# Patient Record
Sex: Female | Born: 1935 | Race: White | Hispanic: No | Marital: Married | Smoking: Former smoker
Health system: Southern US, Community
[De-identification: ages and names within clinical notes are randomized; demographics above are authoritative.]

## PROBLEM LIST (undated history)

## (undated) DIAGNOSIS — Z8619 Personal history of other infectious and parasitic diseases: Secondary | ICD-10-CM

## (undated) DIAGNOSIS — I639 Cerebral infarction, unspecified: Secondary | ICD-10-CM

## (undated) DIAGNOSIS — I10 Essential (primary) hypertension: Secondary | ICD-10-CM

## (undated) DIAGNOSIS — I4891 Unspecified atrial fibrillation: Secondary | ICD-10-CM

## (undated) DIAGNOSIS — E785 Hyperlipidemia, unspecified: Secondary | ICD-10-CM

## (undated) DIAGNOSIS — Z7901 Long term (current) use of anticoagulants: Secondary | ICD-10-CM

## (undated) HISTORY — DX: Hyperlipidemia, unspecified: E78.5

## (undated) HISTORY — DX: Cerebral infarction, unspecified: I63.9

## (undated) HISTORY — DX: Unspecified atrial fibrillation: I48.91

## (undated) HISTORY — PX: APPENDECTOMY: SHX54

## (undated) HISTORY — PX: DILATION AND CURETTAGE OF UTERUS: SHX78

## (undated) HISTORY — DX: Personal history of other infectious and parasitic diseases: Z86.19

## (undated) HISTORY — DX: Essential (primary) hypertension: I10

## (undated) HISTORY — PX: BACK SURGERY: SHX140

## (undated) HISTORY — DX: Long term (current) use of anticoagulants: Z79.01

---

## 1991-11-09 HISTORY — PX: CARDIAC CATHETERIZATION: SHX172

## 1999-06-02 ENCOUNTER — Other Ambulatory Visit: Admission: RE | Admit: 1999-06-02 | Discharge: 1999-06-02 | Payer: Self-pay | Admitting: Obstetrics & Gynecology

## 2000-07-05 ENCOUNTER — Other Ambulatory Visit: Admission: RE | Admit: 2000-07-05 | Discharge: 2000-07-05 | Payer: Self-pay | Admitting: Obstetrics & Gynecology

## 2002-08-09 ENCOUNTER — Other Ambulatory Visit: Admission: RE | Admit: 2002-08-09 | Discharge: 2002-08-09 | Payer: Self-pay | Admitting: Obstetrics & Gynecology

## 2004-09-24 ENCOUNTER — Other Ambulatory Visit: Admission: RE | Admit: 2004-09-24 | Discharge: 2004-09-24 | Payer: Self-pay | Admitting: Obstetrics & Gynecology

## 2006-04-16 ENCOUNTER — Encounter: Admission: RE | Admit: 2006-04-16 | Discharge: 2006-04-16 | Payer: Self-pay | Admitting: Cardiology

## 2010-06-19 ENCOUNTER — Ambulatory Visit: Payer: Self-pay | Admitting: Cardiology

## 2010-07-21 ENCOUNTER — Ambulatory Visit: Payer: Self-pay | Admitting: Cardiology

## 2010-08-19 ENCOUNTER — Ambulatory Visit: Payer: Self-pay | Admitting: Cardiology

## 2010-08-22 ENCOUNTER — Ambulatory Visit: Payer: Self-pay | Admitting: Cardiology

## 2010-09-02 ENCOUNTER — Ambulatory Visit: Payer: Self-pay | Admitting: Cardiology

## 2010-09-09 ENCOUNTER — Ambulatory Visit: Payer: Self-pay | Admitting: Cardiology

## 2010-12-01 ENCOUNTER — Encounter (INDEPENDENT_AMBULATORY_CARE_PROVIDER_SITE_OTHER): Payer: Medicare Other

## 2010-12-01 DIAGNOSIS — R079 Chest pain, unspecified: Secondary | ICD-10-CM

## 2010-12-01 DIAGNOSIS — Z7901 Long term (current) use of anticoagulants: Secondary | ICD-10-CM

## 2010-12-04 ENCOUNTER — Other Ambulatory Visit (INDEPENDENT_AMBULATORY_CARE_PROVIDER_SITE_OTHER): Payer: Medicare Other

## 2010-12-04 DIAGNOSIS — Z7901 Long term (current) use of anticoagulants: Secondary | ICD-10-CM

## 2010-12-04 DIAGNOSIS — R079 Chest pain, unspecified: Secondary | ICD-10-CM

## 2010-12-18 ENCOUNTER — Encounter (INDEPENDENT_AMBULATORY_CARE_PROVIDER_SITE_OTHER): Payer: Medicare Other

## 2010-12-18 DIAGNOSIS — Z7901 Long term (current) use of anticoagulants: Secondary | ICD-10-CM

## 2010-12-18 DIAGNOSIS — I6350 Cerebral infarction due to unspecified occlusion or stenosis of unspecified cerebral artery: Secondary | ICD-10-CM

## 2010-12-30 ENCOUNTER — Ambulatory Visit (INDEPENDENT_AMBULATORY_CARE_PROVIDER_SITE_OTHER): Payer: Medicare Other | Admitting: Cardiology

## 2010-12-30 DIAGNOSIS — Z79899 Other long term (current) drug therapy: Secondary | ICD-10-CM

## 2010-12-30 DIAGNOSIS — E7800 Pure hypercholesterolemia, unspecified: Secondary | ICD-10-CM

## 2010-12-30 DIAGNOSIS — Z7901 Long term (current) use of anticoagulants: Secondary | ICD-10-CM

## 2010-12-30 DIAGNOSIS — I6350 Cerebral infarction due to unspecified occlusion or stenosis of unspecified cerebral artery: Secondary | ICD-10-CM

## 2010-12-30 DIAGNOSIS — I119 Hypertensive heart disease without heart failure: Secondary | ICD-10-CM

## 2011-01-07 ENCOUNTER — Other Ambulatory Visit: Payer: Self-pay | Admitting: *Deleted

## 2011-01-07 DIAGNOSIS — I10 Essential (primary) hypertension: Secondary | ICD-10-CM

## 2011-01-07 MED ORDER — OLMESARTAN MEDOXOMIL-HCTZ 40-25 MG PO TABS: 1.0000 | ORAL_TABLET | Freq: Every day | ORAL | Status: DC

## 2011-01-07 NOTE — Telephone Encounter (Signed)
Refilled meds per fax request.  

## 2011-01-29 ENCOUNTER — Ambulatory Visit (INDEPENDENT_AMBULATORY_CARE_PROVIDER_SITE_OTHER): Payer: Medicare Other | Admitting: *Deleted

## 2011-01-29 DIAGNOSIS — I639 Cerebral infarction, unspecified: Secondary | ICD-10-CM | POA: Insufficient documentation

## 2011-01-29 DIAGNOSIS — I6350 Cerebral infarction due to unspecified occlusion or stenosis of unspecified cerebral artery: Secondary | ICD-10-CM

## 2011-01-29 LAB — POCT INR: INR: 2.4

## 2011-02-26 ENCOUNTER — Ambulatory Visit (INDEPENDENT_AMBULATORY_CARE_PROVIDER_SITE_OTHER): Payer: Medicare Other | Admitting: *Deleted

## 2011-02-26 DIAGNOSIS — I639 Cerebral infarction, unspecified: Secondary | ICD-10-CM

## 2011-02-26 DIAGNOSIS — I6350 Cerebral infarction due to unspecified occlusion or stenosis of unspecified cerebral artery: Secondary | ICD-10-CM

## 2011-02-26 LAB — POCT INR: INR: 2.5

## 2011-03-25 ENCOUNTER — Encounter: Payer: Self-pay | Admitting: Cardiology

## 2011-03-25 ENCOUNTER — Other Ambulatory Visit: Payer: Self-pay | Admitting: *Deleted

## 2011-03-25 DIAGNOSIS — E785 Hyperlipidemia, unspecified: Secondary | ICD-10-CM

## 2011-03-26 ENCOUNTER — Other Ambulatory Visit (INDEPENDENT_AMBULATORY_CARE_PROVIDER_SITE_OTHER): Payer: Medicare Other | Admitting: *Deleted

## 2011-03-26 ENCOUNTER — Ambulatory Visit (INDEPENDENT_AMBULATORY_CARE_PROVIDER_SITE_OTHER): Payer: Medicare Other | Admitting: *Deleted

## 2011-03-26 DIAGNOSIS — I6350 Cerebral infarction due to unspecified occlusion or stenosis of unspecified cerebral artery: Secondary | ICD-10-CM

## 2011-03-26 DIAGNOSIS — E785 Hyperlipidemia, unspecified: Secondary | ICD-10-CM

## 2011-03-26 DIAGNOSIS — I639 Cerebral infarction, unspecified: Secondary | ICD-10-CM

## 2011-03-26 LAB — HEPATIC FUNCTION PANEL
ALT: 29 U/L (ref 0–35)
AST: 33 U/L (ref 0–37)
Albumin: 4.4 g/dL (ref 3.5–5.2)
Alkaline Phosphatase: 74 U/L (ref 39–117)
Bilirubin, Direct: 0.1 mg/dL (ref 0.0–0.3)
Total Bilirubin: 0.5 mg/dL (ref 0.3–1.2)
Total Protein: 7.5 g/dL (ref 6.0–8.3)

## 2011-03-26 LAB — LIPID PANEL
Cholesterol: 178 mg/dL (ref 0–200)
HDL: 78.2 mg/dL (ref >39.00)
LDL Cholesterol: 86 mg/dL (ref 0–99)
Total CHOL/HDL Ratio: 2
Triglycerides: 70 mg/dL (ref 0.0–149.0)
VLDL: 14 mg/dL (ref 0.0–40.0)

## 2011-03-26 LAB — BASIC METABOLIC PANEL
BUN: 17 mg/dL (ref 6–23)
CO2: 30 mEq/L (ref 19–32)
Calcium: 9.5 mg/dL (ref 8.4–10.5)
Chloride: 92 mEq/L — ABNORMAL LOW (ref 96–112)
Creatinine, Ser: 0.9 mg/dL (ref 0.4–1.2)
GFR: 63.35 mL/min (ref >60.00)
Glucose, Bld: 107 mg/dL — ABNORMAL HIGH (ref 70–99)
Potassium: 3.8 mEq/L (ref 3.5–5.1)
Sodium: 134 mEq/L — ABNORMAL LOW (ref 135–145)

## 2011-03-26 LAB — POCT INR: INR: 2.2

## 2011-03-27 ENCOUNTER — Ambulatory Visit (INDEPENDENT_AMBULATORY_CARE_PROVIDER_SITE_OTHER): Payer: Medicare Other | Admitting: Cardiology

## 2011-03-27 ENCOUNTER — Encounter: Payer: Self-pay | Admitting: Cardiology

## 2011-03-27 DIAGNOSIS — I639 Cerebral infarction, unspecified: Secondary | ICD-10-CM

## 2011-03-27 DIAGNOSIS — I6350 Cerebral infarction due to unspecified occlusion or stenosis of unspecified cerebral artery: Secondary | ICD-10-CM

## 2011-03-27 DIAGNOSIS — I119 Hypertensive heart disease without heart failure: Secondary | ICD-10-CM

## 2011-03-27 DIAGNOSIS — E7800 Pure hypercholesterolemia, unspecified: Secondary | ICD-10-CM

## 2011-03-27 DIAGNOSIS — E785 Hyperlipidemia, unspecified: Secondary | ICD-10-CM | POA: Insufficient documentation

## 2011-03-27 NOTE — Assessment & Plan Note (Signed)
The patient is on long-term Coumadin for her priorCerebrovascular accident.  Her stroke occurred up in [place] and she was placed on warfarin at that time.  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  Her last echocardiogram here on 02/19/10 showed normal left ventricular systolic function, impaired relaxation, trivial aortic insufficiency, mild tricuspid regurgitation with normal pulmonary artery pressure.  Mitral valve prolapse was not seen.  Left atrial size was upper normal at 40 mm.  She has not had any further TIA or stroke symptoms since being on Coumadin.

## 2011-03-27 NOTE — Progress Notes (Signed)
[name] Date of Birth:  [birth year] [facility] / [facility] [address]   [address] [place], [place]  [address] [phone]           Fax   [phone]  History of Present Illness: This pleasant 77 year old woman is seen for a followup office visit and protime.  She has a past history of an embolic stroke which occurred while traveling in [place] several years ago.  She's been on Coumadin since that time with no recurrence of her symptoms.  She does not have any history of known coronary disease.  Her last nuclear stress test was 03/04/10 and showed normal myocardial perfusion imaging and her left intraocular ejection fraction was vigorous at 85% with no regional wall motion abnormalities.  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  Patient has a history of hypercholesterolemia and is on statin therapy.  She's had a history of essential hypertension she quit smoking in 2007  Current Outpatient Prescriptions  Medication Sig Dispense Refill  . amLODipine (NORVASC) 5 MG tablet Take 5 mg by mouth daily.        Marland Kitchen aspirin 81 MG tablet Take 81 mg by mouth daily.        Marland Kitchen atorvastatin (LIPITOR) 10 MG tablet Take 10 mg by mouth daily.        . B Complex-C-Folic Acid (FOLBEE PLUS PO) Take by mouth daily.        . Calcium Carbonate-Vitamin D (CALCIUM 600 + D PO) Take by mouth daily.        . diazepam (VALIUM) 2 MG tablet Take 2 mg by mouth every 6 (six) hours as needed.        . furosemide (LASIX) 20 MG tablet Take 20 mg by mouth as needed.        . nadolol (CORGARD) 40 MG tablet Take 40 mg by mouth daily.        . nitroGLYCERIN (NITROSTAT) 0.4 MG SL tablet Place 0.4 mg under the tongue every 5 (five) minutes as needed.        Marland Kitchen olmesartan-hydrochlorothiazide (BENICAR HCT)  40-25 MG per tablet Take 1 tablet by mouth daily.  90 tablet  3  . warfarin (COUMADIN) 5 MG tablet Take 5 mg by mouth as directed.          Allergies  Allergen Reactions  . Cozaar   . Hydralazine   . Hyzaar   . Lisinopril   . Sulfa Drugs Cross Reactors     Patient Active Problem List  Diagnoses  . CVA (cerebral infarction)  . Benign hypertensive heart disease without heart failure  . Hypercholesterolemia    History  Smoking status  . Former Smoker  . Quit date: 10/19/1998  Smokeless tobacco  . Not on file    History  Alcohol Use No    Family History  Problem Relation Age of Onset  . Heart disease Mother   . Hypertension Mother   . Stroke Mother   . Heart disease Father   . Heart failure Brother     Review of Systems: Constitutional: no fever chills diaphoresis or fatigue or change in weight.  Head and neck: no hearing loss, no epistaxis, no photophobia or visual disturbance. Respiratory: No cough, shortness of breath or wheezing. Cardiovascular: No chest pain peripheral edema, palpitations. Gastrointestinal:  No abdominal distention, no abdominal pain, no change in bowel habits hematochezia or melena. Genitourinary: No dysuria, no frequency, no urgency, no nocturia. Musculoskeletal:No arthralgias, no back pain, no gait disturbance or myalgias. Neurological: No dizziness, no headaches, no numbness, no seizures, no syncope, no weakness, no tremors. Hematologic: No lymphadenopathy, no easy bruising. Psychiatric: No confusion, no hallucinations, no sleep disturbance.    Physical Exam: Filed Vitals:   03/27/11 1354  BP: 120/68  Pulse: 64  The general appearance reveals a well-developed well-nourished woman in no distress.The head and neck exam reveals pupils equal and reactive.  Extraocular movements are full.  There is no scleral icterus.  The mouth and pharynx are normal.  The neck is supple.  The carotids reveal no bruits.  The jugular venous pressure is  normal.  The  thyroid is not enlarged.  There is no lymphadenopathy.  The chest is clear to percussion and auscultation.  There are no rales or rhonchi.  Expansion of the chest is symmetrical.  The precordium is quiet.  The first heart sound is normal.  The second heart sound is physiologically split.  There is no murmur gallop rub or click.  There is no abnormal lift or heave.  The abdomen is soft and nontender.  The bowel sounds are normal.  The liver and spleen are not enlarged.  There are no abdominal masses.  There are no abdominal bruits.  Extremities reveal good pedal pulses.  There is no phlebitis or edema.  There is no cyanosis or clubbing.  Strength is normal and symmetrical in all extremities.  There is no lateralizing weakness.  There are no sensory deficits.  The skin is warm and dry.  There is no rash.   Assessment / Plan: Continue same medication.  Recheck in 4 months for followup office visitEKG and fasting lipid panel hepatic function panel and basal metabolic panel.  Return next month for a followup protime.  INR today is 2.2

## 2011-03-27 NOTE — Assessment & Plan Note (Signed)
This 77 year old woman has a history of prior stroke and a history of essential hypertension.  She has not been expressing any recent symptoms from her high blood pressure.  She denies any dizziness or headache.  She's not having any chest pain or shortness of breath.  No syncope.

## 2011-03-27 NOTE — Assessment & Plan Note (Signed)
The patient has a history of hypercholesterolemia.  She is on Lipitor.  We reviewed her blood work which was drawn recently and shows satisfactory response to medication.  She's not having any leg pain or other myalgias or side effects from his statin therapy.

## 2011-04-24 ENCOUNTER — Ambulatory Visit (INDEPENDENT_AMBULATORY_CARE_PROVIDER_SITE_OTHER): Payer: Medicare Other | Admitting: *Deleted

## 2011-04-24 DIAGNOSIS — I6350 Cerebral infarction due to unspecified occlusion or stenosis of unspecified cerebral artery: Secondary | ICD-10-CM

## 2011-04-24 DIAGNOSIS — I639 Cerebral infarction, unspecified: Secondary | ICD-10-CM

## 2011-04-24 LAB — POCT INR: INR: 2.7

## 2011-04-26 ENCOUNTER — Other Ambulatory Visit: Payer: Self-pay | Admitting: Cardiology

## 2011-04-26 DIAGNOSIS — I10 Essential (primary) hypertension: Secondary | ICD-10-CM

## 2011-04-27 ENCOUNTER — Other Ambulatory Visit: Payer: Self-pay | Admitting: *Deleted

## 2011-04-27 NOTE — Telephone Encounter (Signed)
Refilled nadolol to [facility]

## 2011-04-30 ENCOUNTER — Other Ambulatory Visit: Payer: Self-pay | Admitting: Cardiology

## 2011-04-30 DIAGNOSIS — F419 Anxiety disorder, unspecified: Secondary | ICD-10-CM

## 2011-05-26 LAB — PROTIME-INR: INR: 2.7 — AB (ref <=1.1)

## 2011-06-01 ENCOUNTER — Telehealth: Payer: Self-pay | Admitting: Cardiology

## 2011-06-01 ENCOUNTER — Ambulatory Visit (INDEPENDENT_AMBULATORY_CARE_PROVIDER_SITE_OTHER): Payer: Self-pay | Admitting: Cardiology

## 2011-06-01 DIAGNOSIS — R0989 Other specified symptoms and signs involving the circulatory and respiratory systems: Secondary | ICD-10-CM

## 2011-06-01 NOTE — Telephone Encounter (Signed)
Pt would like to confirm about a fax report of coumadin in another state that we should have received, please call pt back, looking for chart

## 2011-06-01 NOTE — Telephone Encounter (Signed)
Chart in box 

## 2011-06-01 NOTE — Telephone Encounter (Signed)
Advised patient did receive however did not have a call back #.  Advised to continue same dose of medications of coumadin and recheck in 4 weeks.

## 2011-06-12 ENCOUNTER — Encounter: Payer: Medicare Other | Admitting: *Deleted

## 2011-06-16 ENCOUNTER — Ambulatory Visit (INDEPENDENT_AMBULATORY_CARE_PROVIDER_SITE_OTHER): Payer: Medicare Other | Admitting: *Deleted

## 2011-06-16 ENCOUNTER — Encounter: Payer: Medicare Other | Admitting: *Deleted

## 2011-06-16 DIAGNOSIS — I6350 Cerebral infarction due to unspecified occlusion or stenosis of unspecified cerebral artery: Secondary | ICD-10-CM

## 2011-06-16 DIAGNOSIS — I639 Cerebral infarction, unspecified: Secondary | ICD-10-CM

## 2011-06-16 LAB — POCT INR: INR: 2.6

## 2011-07-14 ENCOUNTER — Ambulatory Visit (INDEPENDENT_AMBULATORY_CARE_PROVIDER_SITE_OTHER): Payer: Medicare Other | Admitting: *Deleted

## 2011-07-14 DIAGNOSIS — I6350 Cerebral infarction due to unspecified occlusion or stenosis of unspecified cerebral artery: Secondary | ICD-10-CM

## 2011-07-14 DIAGNOSIS — I639 Cerebral infarction, unspecified: Secondary | ICD-10-CM

## 2011-07-14 DIAGNOSIS — E785 Hyperlipidemia, unspecified: Secondary | ICD-10-CM

## 2011-07-14 LAB — POCT INR: INR: 2.2

## 2011-08-11 ENCOUNTER — Other Ambulatory Visit (INDEPENDENT_AMBULATORY_CARE_PROVIDER_SITE_OTHER): Payer: Medicare Other | Admitting: *Deleted

## 2011-08-11 ENCOUNTER — Ambulatory Visit (INDEPENDENT_AMBULATORY_CARE_PROVIDER_SITE_OTHER): Payer: Medicare Other | Admitting: *Deleted

## 2011-08-11 DIAGNOSIS — I6350 Cerebral infarction due to unspecified occlusion or stenosis of unspecified cerebral artery: Secondary | ICD-10-CM

## 2011-08-11 DIAGNOSIS — E785 Hyperlipidemia, unspecified: Secondary | ICD-10-CM

## 2011-08-11 DIAGNOSIS — I639 Cerebral infarction, unspecified: Secondary | ICD-10-CM

## 2011-08-11 LAB — COMPREHENSIVE METABOLIC PANEL
ALT: 23 U/L (ref 0–35)
AST: 30 U/L (ref 0–37)
Albumin: 4.2 g/dL (ref 3.5–5.2)
Alkaline Phosphatase: 74 U/L (ref 39–117)
BUN: 16 mg/dL (ref 6–23)
CO2: 31 mEq/L (ref 19–32)
Calcium: 9.6 mg/dL (ref 8.4–10.5)
Chloride: 95 mEq/L — ABNORMAL LOW (ref 96–112)
Creatinine, Ser: 1.1 mg/dL (ref 0.4–1.2)
GFR: 49.3 mL/min — ABNORMAL LOW (ref >60.00)
Glucose, Bld: 109 mg/dL — ABNORMAL HIGH (ref 70–99)
Potassium: 3.8 mEq/L (ref 3.5–5.1)
Sodium: 134 mEq/L — ABNORMAL LOW (ref 135–145)
Total Bilirubin: 0.6 mg/dL (ref 0.3–1.2)
Total Protein: 7.4 g/dL (ref 6.0–8.3)

## 2011-08-11 LAB — LIPID PANEL
Cholesterol: 162 mg/dL (ref 0–200)
HDL: 83.8 mg/dL (ref >39.00)
LDL Cholesterol: 62 mg/dL (ref 0–99)
Total CHOL/HDL Ratio: 2
Triglycerides: 82 mg/dL (ref 0.0–149.0)
VLDL: 16.4 mg/dL (ref 0.0–40.0)

## 2011-08-11 LAB — POCT INR: INR: 2.3

## 2011-08-12 ENCOUNTER — Other Ambulatory Visit: Payer: Self-pay | Admitting: Cardiology

## 2011-08-18 ENCOUNTER — Telehealth: Payer: Self-pay | Admitting: *Deleted

## 2011-08-18 NOTE — Telephone Encounter (Signed)
Message copied by Burnell Blanks on Tue Aug 18, 2011  9:47 AM ------      Message from: Cassell Clement      Created: Sun Aug 16, 2011  4:18 PM       Please report.  The blood work is satisfactory.  Discuss further at office visit in November.  Make copy for patient

## 2011-08-18 NOTE — Progress Notes (Signed)
Advised of labs 

## 2011-08-18 NOTE — Telephone Encounter (Signed)
Advised patient of labs and made copy for upcoming office visit

## 2011-09-01 ENCOUNTER — Other Ambulatory Visit: Payer: Self-pay | Admitting: Nurse Practitioner

## 2011-09-07 ENCOUNTER — Ambulatory Visit (INDEPENDENT_AMBULATORY_CARE_PROVIDER_SITE_OTHER): Payer: Medicare Other | Admitting: Cardiology

## 2011-09-07 ENCOUNTER — Encounter: Payer: Self-pay | Admitting: Cardiology

## 2011-09-07 VITALS — BP 138/80 | HR 60 | Ht 62.0 in | Wt 142.0 lb

## 2011-09-07 DIAGNOSIS — E7800 Pure hypercholesterolemia, unspecified: Secondary | ICD-10-CM

## 2011-09-07 DIAGNOSIS — I639 Cerebral infarction, unspecified: Secondary | ICD-10-CM

## 2011-09-07 DIAGNOSIS — E785 Hyperlipidemia, unspecified: Secondary | ICD-10-CM

## 2011-09-07 DIAGNOSIS — R0989 Other specified symptoms and signs involving the circulatory and respiratory systems: Secondary | ICD-10-CM

## 2011-09-07 DIAGNOSIS — I6350 Cerebral infarction due to unspecified occlusion or stenosis of unspecified cerebral artery: Secondary | ICD-10-CM

## 2011-09-07 DIAGNOSIS — R0600 Dyspnea, unspecified: Secondary | ICD-10-CM

## 2011-09-07 DIAGNOSIS — R0609 Other forms of dyspnea: Secondary | ICD-10-CM

## 2011-09-07 DIAGNOSIS — I119 Hypertensive heart disease without heart failure: Secondary | ICD-10-CM

## 2011-09-07 MED ORDER — ATORVASTATIN CALCIUM 20 MG PO TABS: 20.0000 mg | ORAL_TABLET | Freq: Every day | ORAL | Status: DC

## 2011-09-07 NOTE — Assessment & Plan Note (Signed)
The patient is on Lipitor for her cholesterol.  She's not having any side effects from the Lipitor.  Some laboratory studies were excellent.  We are switching her from brand name Lipitor to generic Lipitor to save money

## 2011-09-07 NOTE — Progress Notes (Signed)
[name] Date of Birth:  [birth year] [facility] / [facility] [address]   [address] [place], [place]  [address] [phone]           Fax   [phone]  History of Present Illness: This pleasant 77 year old woman is seen for a scheduled followup office visit.  She has a past history of a cerebrovascular infarction and a history of high blood pressure and high cholesterol.  She is a former smoker.  He does not have a history of coronary disease.  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  Patient has high cholesterol and is on statin therapy.  She has been on long-term Coumadin since her embolic stroke which occurred several years ago.  She had a nuclear stress test on 03/04/10 which showed no regional wall motion abnormalities.  Current Outpatient Prescriptions  Medication Sig Dispense Refill  . amLODipine (NORVASC) 5 MG tablet TAKE ONE TABLET BY MOUTH EVERY DAY  90 tablet  3  . aspirin 81 MG tablet Take 81 mg by mouth daily.        Marland Kitchen atorvastatin (LIPITOR) 20 MG tablet Take 1 tablet (20 mg total) by mouth daily.  90 each  3  . B Complex-C-Folic Acid (FOLBEE PLUS PO) Take by mouth daily.        . Calcium Carbonate-Vitamin D (CALCIUM 600 + D PO) Take by mouth daily.        . diazepam (VALIUM) 2 MG tablet TAKE ONE TABLET BY MOUTH EVERY DAY AS NEEDED  100 tablet  1  . furosemide (LASIX) 20 MG tablet Take 20 mg by mouth as needed.        . nadolol (CORGARD) 40 MG tablet TAKE ONE TABLET BY MOUTH EVERY DAY  90 tablet  3  . nitroGLYCERIN (NITROSTAT) 0.4 MG SL tablet Place 0.4 mg under the tongue every 5 (five) minutes as needed.        Marland Kitchen olmesartan-hydrochlorothiazide (BENICAR HCT) 40-25 MG per tablet Take 1 tablet by mouth daily.  90 tablet  3  . warfarin (COUMADIN) 5 MG tablet Take 5 mg by mouth as directed.           Allergies  Allergen Reactions  . Cozaar   . Hydralazine   . Hyzaar   . Lisinopril   . Sulfa Drugs Cross Reactors     Patient Active Problem List  Diagnoses  . CVA (cerebral infarction)  . Benign hypertensive heart disease without heart failure  . Hypercholesterolemia    History  Smoking status  . Former Smoker  . Quit date: 10/19/1998  Smokeless tobacco  . Not on file    History  Alcohol Use No    Family History  Problem Relation Age of Onset  . Heart disease Mother   . Hypertension Mother   . Stroke Mother   . Heart disease Father   . Heart failure Brother     Review of Systems: Constitutional: no fever chills diaphoresis or fatigue or change in weight.  Head and neck: no hearing loss, no epistaxis, no photophobia or visual disturbance. Respiratory: No cough, shortness of breath or wheezing. Cardiovascular: No chest pain peripheral edema, palpitations. Gastrointestinal: No abdominal distention, no abdominal pain, no change in bowel habits hematochezia or melena. Genitourinary: No dysuria, no frequency, no urgency, no nocturia. Musculoskeletal:No arthralgias, no back pain, no gait  disturbance or myalgias. Neurological: No dizziness, no headaches, no numbness, no seizures, no syncope, no weakness, no tremors. Hematologic: No lymphadenopathy, no easy bruising. Psychiatric: No confusion, no hallucinations, no sleep disturbance.    Physical Exam: Filed Vitals:   09/07/11 1530  BP: 138/80  Pulse: 60   The head and neck exam reveals pupils equal and reactive.  Extraocular movements are full.  There is no scleral icterus.  The mouth and pharynx are normal.  The neck is supple.  The carotids reveal no bruits.  The jugular venous pressure is normal.  The  thyroid is not enlarged.  There is no lymphadenopathy.  The chest is clear to percussion and auscultation.  There are no rales or rhonchi.  Expansion of the chest is symmetrical.  The precordium is quiet.  The  first heart sound is normal.  The second heart sound is physiologically split.  There is no murmur gallop rub or click.  There is no abnormal lift or heave.  The abdomen is soft and nontender.  The bowel sounds are normal.  The liver and spleen are not enlarged.  There are no abdominal masses.  There are no abdominal bruits.  Extremities reveal good pedal pulses.  There is no phlebitis or edema.  There is no cyanosis or clubbing.  Strength is normal and symmetrical in all extremities.  There is no lateralizing weakness.  There are no sensory deficits.  The skin is warm and dry.  There is no rash.    Assessment / Plan: EKG today shows sinus bradycardia with first degree AV block and no ischemic changes.  She will continue same medication and recheck in 4 months for followup office visit lipid panel CBC and chemistries

## 2011-09-07 NOTE — Assessment & Plan Note (Signed)
The patient has occasional headaches.  She has not had any TIA symptoms or symptoms of recurrent stroke.  She's not having a problem from the warfarin.

## 2011-09-07 NOTE — Patient Instructions (Signed)
Will send Rx for generic Lipitor to [facility] for 90 days with refills Your physician recommends that you schedule a follow-up appointment in: 4 months with fasting labs (lp/bmet/hfp/cbc)

## 2011-09-07 NOTE — Assessment & Plan Note (Signed)
The patient has not had any problems recently with her blood pressure.  She denies dizzy spells or syncope.  She has mild exertional dyspnea which has been chronic.  She is a former smoker

## 2011-09-22 ENCOUNTER — Ambulatory Visit (INDEPENDENT_AMBULATORY_CARE_PROVIDER_SITE_OTHER): Payer: Medicare Other | Admitting: *Deleted

## 2011-09-22 DIAGNOSIS — I6350 Cerebral infarction due to unspecified occlusion or stenosis of unspecified cerebral artery: Secondary | ICD-10-CM

## 2011-09-22 DIAGNOSIS — I639 Cerebral infarction, unspecified: Secondary | ICD-10-CM

## 2011-09-22 LAB — POCT INR: INR: 2.2

## 2011-11-03 ENCOUNTER — Ambulatory Visit (INDEPENDENT_AMBULATORY_CARE_PROVIDER_SITE_OTHER): Payer: Medicare Other | Admitting: *Deleted

## 2011-11-03 DIAGNOSIS — I639 Cerebral infarction, unspecified: Secondary | ICD-10-CM

## 2011-11-03 DIAGNOSIS — I6350 Cerebral infarction due to unspecified occlusion or stenosis of unspecified cerebral artery: Secondary | ICD-10-CM

## 2011-11-03 LAB — POCT INR: INR: 2.4

## 2011-11-23 ENCOUNTER — Other Ambulatory Visit: Payer: Self-pay | Admitting: *Deleted

## 2011-11-25 ENCOUNTER — Telehealth: Payer: Self-pay | Admitting: Cardiology

## 2011-11-25 MED ORDER — FOLBEE PLUS CZ 5 MG PO TABS: 5.0000 mg | ORAL_TABLET | Freq: Every day | ORAL | Status: DC

## 2011-11-25 NOTE — Telephone Encounter (Signed)
New Problem:     Patient called in needing a refill of her Calcium Carbonate-Vitamin D (CALCIUM 600 + D PO) filled with [facility] on [address] ([phone]). This is the only medication that needs to be filled at this location, all other medication should be refilled at the pharmacy listed the patient's profile. Please call back when the order has been filled.

## 2011-11-25 NOTE — Telephone Encounter (Signed)
Refilled folbee

## 2011-11-26 ENCOUNTER — Other Ambulatory Visit: Payer: Self-pay | Admitting: Cardiology

## 2011-11-26 DIAGNOSIS — F419 Anxiety disorder, unspecified: Secondary | ICD-10-CM

## 2011-11-26 NOTE — Telephone Encounter (Signed)
Refill on diazepam

## 2011-12-01 DIAGNOSIS — Z1231 Encounter for screening mammogram for malignant neoplasm of breast: Secondary | ICD-10-CM | POA: Diagnosis not present

## 2011-12-15 ENCOUNTER — Ambulatory Visit (INDEPENDENT_AMBULATORY_CARE_PROVIDER_SITE_OTHER): Payer: Medicare Other | Admitting: *Deleted

## 2011-12-15 DIAGNOSIS — I6350 Cerebral infarction due to unspecified occlusion or stenosis of unspecified cerebral artery: Secondary | ICD-10-CM | POA: Diagnosis not present

## 2011-12-15 DIAGNOSIS — I639 Cerebral infarction, unspecified: Secondary | ICD-10-CM

## 2011-12-15 LAB — POCT INR
INR: 3.2
INR: 3.2

## 2011-12-25 ENCOUNTER — Other Ambulatory Visit: Payer: Self-pay | Admitting: Cardiology

## 2012-01-04 ENCOUNTER — Other Ambulatory Visit (INDEPENDENT_AMBULATORY_CARE_PROVIDER_SITE_OTHER): Payer: Medicare Other

## 2012-01-04 DIAGNOSIS — R0609 Other forms of dyspnea: Secondary | ICD-10-CM | POA: Diagnosis not present

## 2012-01-04 DIAGNOSIS — R0600 Dyspnea, unspecified: Secondary | ICD-10-CM

## 2012-01-04 DIAGNOSIS — E785 Hyperlipidemia, unspecified: Secondary | ICD-10-CM | POA: Diagnosis not present

## 2012-01-04 DIAGNOSIS — R0989 Other specified symptoms and signs involving the circulatory and respiratory systems: Secondary | ICD-10-CM | POA: Diagnosis not present

## 2012-01-04 DIAGNOSIS — I119 Hypertensive heart disease without heart failure: Secondary | ICD-10-CM

## 2012-01-04 LAB — CBC WITH DIFFERENTIAL/PLATELET
Basophils Absolute: 0 10*3/uL (ref 0.0–0.1)
Basophils Relative: 0.7 % (ref 0.0–3.0)
Eosinophils Absolute: 0.2 10*3/uL (ref 0.0–0.7)
Eosinophils Relative: 2.8 % (ref 0.0–5.0)
HCT: 43.4 % (ref 36.0–46.0)
Hemoglobin: 14.3 g/dL (ref 12.0–15.0)
Lymphocytes Relative: 29.8 % (ref 12.0–46.0)
Lymphs Abs: 2 10*3/uL (ref 0.7–4.0)
MCHC: 33 g/dL (ref 30.0–36.0)
MCV: 94.9 fl (ref 78.0–100.0)
Monocytes Absolute: 0.5 10*3/uL (ref 0.1–1.0)
Monocytes Relative: 6.7 % (ref 3.0–12.0)
Neutro Abs: 4.1 10*3/uL (ref 1.4–7.7)
Neutrophils Relative %: 60 % (ref 43.0–77.0)
Platelets: 248 10*3/uL (ref 150.0–400.0)
RBC: 4.57 Mil/uL (ref 3.87–5.11)
RDW: 13.7 % (ref 11.5–14.6)
WBC: 6.8 10*3/uL (ref 4.5–10.5)

## 2012-01-04 LAB — BASIC METABOLIC PANEL
BUN: 12 mg/dL (ref 6–23)
CO2: 30 mEq/L (ref 19–32)
Calcium: 9.8 mg/dL (ref 8.4–10.5)
Chloride: 97 mEq/L (ref 96–112)
Creatinine, Ser: 1.4 mg/dL — ABNORMAL HIGH (ref 0.4–1.2)
GFR: 39.1 mL/min — ABNORMAL LOW (ref >60.00)
Glucose, Bld: 112 mg/dL — ABNORMAL HIGH (ref 70–99)
Potassium: 3.8 mEq/L (ref 3.5–5.1)
Sodium: 136 mEq/L (ref 135–145)

## 2012-01-04 LAB — HEPATIC FUNCTION PANEL
ALT: 24 U/L (ref 0–35)
AST: 34 U/L (ref 0–37)
Albumin: 4 g/dL (ref 3.5–5.2)
Alkaline Phosphatase: 68 U/L (ref 39–117)
Bilirubin, Direct: 0.1 mg/dL (ref 0.0–0.3)
Total Bilirubin: 0.9 mg/dL (ref 0.3–1.2)
Total Protein: 7.4 g/dL (ref 6.0–8.3)

## 2012-01-04 LAB — LIPID PANEL
Cholesterol: 156 mg/dL (ref 0–200)
HDL: 76.2 mg/dL (ref >39.00)
LDL Cholesterol: 64 mg/dL (ref 0–99)
Total CHOL/HDL Ratio: 2
Triglycerides: 79 mg/dL (ref 0.0–149.0)
VLDL: 15.8 mg/dL (ref 0.0–40.0)

## 2012-01-04 NOTE — Progress Notes (Signed)
Quick Note:  Please make copy of labs for patient visit. ______ 

## 2012-01-08 ENCOUNTER — Ambulatory Visit (INDEPENDENT_AMBULATORY_CARE_PROVIDER_SITE_OTHER): Payer: Medicare Other | Admitting: Cardiology

## 2012-01-08 ENCOUNTER — Ambulatory Visit (INDEPENDENT_AMBULATORY_CARE_PROVIDER_SITE_OTHER): Payer: Medicare Other | Admitting: *Deleted

## 2012-01-08 ENCOUNTER — Ambulatory Visit
Admission: RE | Admit: 2012-01-08 | Discharge: 2012-01-08 | Disposition: A | Discharge status: Home / Self Care | Payer: Medicare Other | Source: Ambulatory Visit | Attending: Cardiology | Admitting: Cardiology

## 2012-01-08 ENCOUNTER — Encounter: Payer: Self-pay | Admitting: Cardiology

## 2012-01-08 VITALS — BP 124/86 | HR 66 | Ht 62.0 in | Wt 145.0 lb

## 2012-01-08 DIAGNOSIS — I119 Hypertensive heart disease without heart failure: Secondary | ICD-10-CM | POA: Diagnosis not present

## 2012-01-08 DIAGNOSIS — E7800 Pure hypercholesterolemia, unspecified: Secondary | ICD-10-CM | POA: Diagnosis not present

## 2012-01-08 DIAGNOSIS — I6350 Cerebral infarction due to unspecified occlusion or stenosis of unspecified cerebral artery: Secondary | ICD-10-CM | POA: Diagnosis not present

## 2012-01-08 DIAGNOSIS — I4891 Unspecified atrial fibrillation: Secondary | ICD-10-CM

## 2012-01-08 DIAGNOSIS — I639 Cerebral infarction, unspecified: Secondary | ICD-10-CM

## 2012-01-08 DIAGNOSIS — R0602 Shortness of breath: Secondary | ICD-10-CM | POA: Diagnosis not present

## 2012-01-08 LAB — POCT INR: INR: 3.4

## 2012-01-08 MED ORDER — AMIODARONE HCL 200 MG PO TABS: 200.0000 mg | ORAL_TABLET | Freq: Every day | ORAL | Status: DC

## 2012-01-08 NOTE — Assessment & Plan Note (Signed)
The patient has had no further TIA symptoms or any new strokes.  As noted she is already on Coumadin and her recent prothrombin time has been slightly elevated.

## 2012-01-08 NOTE — Assessment & Plan Note (Signed)
The patient has felt more fatigued and more short of breath for the past several weeks.  She has not been aware of any palpitations.  She has been aware that if she tries to lie on her left side she has difficulty sleeping and this is a new finding recently.  She came to the office today where her EKG shows new atrial fibrillation.  She does not have a past history of atrial fib.

## 2012-01-08 NOTE — Patient Instructions (Addendum)
Will have you go for chest xray today and call you with the results  Avoid caffeine  Add Amiodarone 200 mg daily   Your physician has requested that you have an echocardiogram. Echocardiography is a painless test that uses sound waves to create images of your heart. It provides your doctor with information about the size and shape of your heart and how well your heart's chambers and valves are working. This procedure takes approximately one hour. There are no restrictions for this procedure.  Your physician recommends that you schedule a follow-up appointment in: 1 month

## 2012-01-08 NOTE — Assessment & Plan Note (Signed)
Were reviewed with the patient her recent labs and her lipid status which is satisfactory.

## 2012-01-08 NOTE — Progress Notes (Signed)
[name] Date of Birth:  08/04/1930 [facility] [address] [place], [place]  [address] [phone]         Fax   [phone]  History of Present Illness: This pleasant 78 year old woman is seen for a scheduled followup office visit.  She has a past history of high blood pressure and high cholesterol and a previous cerebrovascular accident.  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  The patient has been on long-term Coumadin since her stroke which occurred in [place] in which the physicians there felt was cardioembolic.  She has been on Coumadin for several years.  The patient had a nuclear stress test in May 2011 which showed no regional wall motion abnormalities and no ischemia.  Current Outpatient Prescriptions  Medication Sig Dispense Refill  . amLODipine (NORVASC) 5 MG tablet TAKE ONE TABLET BY MOUTH EVERY DAY  90 tablet  3  . aspirin 81 MG tablet Take 81 mg by mouth daily.        Marland Kitchen atorvastatin (LIPITOR) 20 MG tablet Take 1 tablet (20 mg total) by mouth daily.  90 each  3  . B-Complex-C-Biotin-Minerals-FA (FOLBEE PLUS CZ) 5 MG TABS Take 1 tablet (5 mg total) by mouth daily.  30 each  11  . Calcium Carbonate-Vitamin D (CALCIUM 600 + D PO) Take by mouth daily.        . diazepam (VALIUM) 2 MG tablet TAKE ONE TABLET BY MOUTH EVERY DAY AS NEEDED  100 tablet  0  . furosemide (LASIX) 20 MG tablet Take 20 mg by mouth as needed.        . nadolol (CORGARD) 40 MG tablet TAKE ONE TABLET BY MOUTH EVERY DAY  90 tablet  3  . nitroGLYCERIN (NITROSTAT) 0.4 MG SL tablet Place 0.4 mg under the tongue every 5 (five) minutes as needed.        Marland Kitchen olmesartan-hydrochlorothiazide (BENICAR HCT) 40-25 MG per tablet Take 1 tablet by mouth as directed. Take 1/2 daily      . warfarin (COUMADIN) 5 MG tablet Take as directed by  anticoagulation clinic  30 tablet  3  . amiodarone (PACERONE) 200 MG tablet Take 1 tablet (200 mg total) by mouth daily.  30 tablet  5    Allergies  Allergen Reactions  . Cozaar   . Hydralazine   . Hyzaar   . Lisinopril   . Sulfa Drugs Cross Reactors     Patient Active Problem List  Diagnoses  . CVA (cerebral infarction)  . Benign hypertensive heart disease without heart failure  . Hypercholesterolemia  . Atrial fibrillation    History  Smoking status  . Former Smoker  . Quit date: 10/19/1998  Smokeless tobacco  . Not on file    History  Alcohol Use No    Family History  Problem Relation Age of Onset  . Heart disease Mother   . Hypertension Mother   . Stroke Mother   . Heart disease Father   . Heart failure Brother     Review of Systems: Constitutional: no fever chills diaphoresis or fatigue or change in weight.  Head and neck: no hearing loss, no epistaxis, no photophobia or visual disturbance. Respiratory: No cough, shortness of breath or wheezing. Cardiovascular: No chest pain peripheral edema, palpitations. Gastrointestinal: No abdominal distention, no abdominal pain, no change in bowel habits hematochezia or  melena. Genitourinary: No dysuria, no frequency, no urgency, no nocturia. Musculoskeletal:No arthralgias, no back pain, no gait disturbance or myalgias. Neurological: No dizziness, no headaches, no numbness, no seizures, no syncope, no weakness, no tremors. Hematologic: No lymphadenopathy, no easy bruising. Psychiatric: No confusion, no hallucinations, no sleep disturbance.    Physical Exam: Filed Vitals:   01/08/12 1412  BP: 124/86  Pulse: 66   the general appearance reveals a well-developed well-nourished woman in no distress.Pupils equal and reactive.   Extraocular Movements are full.  There is no scleral icterus.  The mouth and pharynx are normal.  The neck is supple.  The carotids reveal no bruits.  The jugular venous pressure is normal.  The  thyroid is not enlarged.  There is no lymphadenopathy.  The chest is clear to percussion and auscultation. There are no rales or rhonchi. Expansion of the chest is symmetrical.  The heart reveals an irregular rhythm and no gallop or rub. The abdomen is soft and nontender. Bowel sounds are normal. The liver and spleen are not enlarged. There Are no abdominal masses. There are no bruits.  The pedal pulses are good.  There is no phlebitis or edema.  There is no cyanosis or clubbing. Strength is normal and symmetrical in all extremities.  There is no lateralizing weakness.  There are no sensory deficits.  EKG shows atrial fibrillation with a controlled ventricular response.  Nonspecific ST-T wave changes are present.  No acute changes other than for the atrial fibrillation   Assessment / Plan: The patient is to continue her current dose of warfarin as prescribed in today's Coumadin clinic.  I checked with Kennon Rounds and we do not need to make any additional corrections over the next 2 weeks as she starts on low-dose amiodarone 200 mg one daily.  Her other medications will stay the same except for the addition of amiodarone.  She will return soon for a two-dimensional echocardiogram.  We are also checking a chest x-ray today.  She was advised to avoid caffeine as much as possible.  I will see her back in about a months for followup office visit and EKG.  If she is still in atrial fibrillation we will consider outpatient cardioversion.

## 2012-01-14 ENCOUNTER — Telehealth: Payer: Self-pay | Admitting: *Deleted

## 2012-01-14 NOTE — Telephone Encounter (Signed)
Advised of xray 

## 2012-01-14 NOTE — Telephone Encounter (Signed)
Message copied by Burnell Blanks on Thu Jan 14, 2012 11:13 AM ------      Message from: Cassell Clement      Created: Fri Jan 08, 2012  9:13 PM       Chest xray okay.

## 2012-01-22 ENCOUNTER — Ambulatory Visit (HOSPITAL_COMMUNITY): Payer: Medicare Other | Attending: Cardiology

## 2012-01-22 ENCOUNTER — Ambulatory Visit (INDEPENDENT_AMBULATORY_CARE_PROVIDER_SITE_OTHER): Payer: Medicare Other | Admitting: Pharmacist

## 2012-01-22 ENCOUNTER — Other Ambulatory Visit: Payer: Self-pay

## 2012-01-22 DIAGNOSIS — I6350 Cerebral infarction due to unspecified occlusion or stenosis of unspecified cerebral artery: Secondary | ICD-10-CM | POA: Diagnosis not present

## 2012-01-22 DIAGNOSIS — R0609 Other forms of dyspnea: Secondary | ICD-10-CM | POA: Diagnosis not present

## 2012-01-22 DIAGNOSIS — I639 Cerebral infarction, unspecified: Secondary | ICD-10-CM

## 2012-01-22 DIAGNOSIS — R0989 Other specified symptoms and signs involving the circulatory and respiratory systems: Secondary | ICD-10-CM | POA: Insufficient documentation

## 2012-01-22 DIAGNOSIS — Z8673 Personal history of transient ischemic attack (TIA), and cerebral infarction without residual deficits: Secondary | ICD-10-CM | POA: Insufficient documentation

## 2012-01-22 DIAGNOSIS — I4891 Unspecified atrial fibrillation: Secondary | ICD-10-CM

## 2012-01-22 DIAGNOSIS — R0789 Other chest pain: Secondary | ICD-10-CM | POA: Diagnosis not present

## 2012-01-22 DIAGNOSIS — R5381 Other malaise: Secondary | ICD-10-CM | POA: Diagnosis not present

## 2012-01-22 DIAGNOSIS — Z87891 Personal history of nicotine dependence: Secondary | ICD-10-CM | POA: Insufficient documentation

## 2012-01-22 DIAGNOSIS — Z7901 Long term (current) use of anticoagulants: Secondary | ICD-10-CM

## 2012-01-22 DIAGNOSIS — R0602 Shortness of breath: Secondary | ICD-10-CM | POA: Insufficient documentation

## 2012-01-22 DIAGNOSIS — E785 Hyperlipidemia, unspecified: Secondary | ICD-10-CM | POA: Insufficient documentation

## 2012-01-22 DIAGNOSIS — I10 Essential (primary) hypertension: Secondary | ICD-10-CM | POA: Insufficient documentation

## 2012-01-22 LAB — POCT INR: INR: 3.2

## 2012-01-29 ENCOUNTER — Telehealth: Payer: Self-pay | Admitting: Cardiology

## 2012-01-29 NOTE — Telephone Encounter (Signed)
New msg Pt was calling a about echo results

## 2012-01-29 NOTE — Telephone Encounter (Signed)
Results of ECHO given to pt per her request--nt

## 2012-02-02 NOTE — Telephone Encounter (Signed)
Noted on echo.

## 2012-02-05 ENCOUNTER — Ambulatory Visit (INDEPENDENT_AMBULATORY_CARE_PROVIDER_SITE_OTHER): Payer: Medicare Other | Admitting: Pharmacist

## 2012-02-05 DIAGNOSIS — I4891 Unspecified atrial fibrillation: Secondary | ICD-10-CM

## 2012-02-05 DIAGNOSIS — Z7901 Long term (current) use of anticoagulants: Secondary | ICD-10-CM

## 2012-02-05 DIAGNOSIS — I6350 Cerebral infarction due to unspecified occlusion or stenosis of unspecified cerebral artery: Secondary | ICD-10-CM | POA: Diagnosis not present

## 2012-02-05 DIAGNOSIS — I639 Cerebral infarction, unspecified: Secondary | ICD-10-CM

## 2012-02-05 LAB — POCT INR: INR: 3.1

## 2012-02-09 ENCOUNTER — Encounter: Payer: Self-pay | Admitting: Cardiology

## 2012-02-12 NOTE — Progress Notes (Signed)
Addended by: Reine Just on: 02/12/2012 09:38 AM   Modules accepted: Orders

## 2012-02-15 ENCOUNTER — Ambulatory Visit (INDEPENDENT_AMBULATORY_CARE_PROVIDER_SITE_OTHER): Payer: Medicare Other | Admitting: Cardiology

## 2012-02-15 ENCOUNTER — Ambulatory Visit (INDEPENDENT_AMBULATORY_CARE_PROVIDER_SITE_OTHER): Payer: Medicare Other | Admitting: *Deleted

## 2012-02-15 ENCOUNTER — Encounter: Payer: Self-pay | Admitting: Cardiology

## 2012-02-15 VITALS — BP 136/84 | HR 44 | Ht 62.0 in | Wt 147.0 lb

## 2012-02-15 DIAGNOSIS — I6350 Cerebral infarction due to unspecified occlusion or stenosis of unspecified cerebral artery: Secondary | ICD-10-CM

## 2012-02-15 DIAGNOSIS — I119 Hypertensive heart disease without heart failure: Secondary | ICD-10-CM

## 2012-02-15 DIAGNOSIS — I639 Cerebral infarction, unspecified: Secondary | ICD-10-CM

## 2012-02-15 DIAGNOSIS — I4891 Unspecified atrial fibrillation: Secondary | ICD-10-CM | POA: Diagnosis not present

## 2012-02-15 DIAGNOSIS — R5381 Other malaise: Secondary | ICD-10-CM

## 2012-02-15 DIAGNOSIS — Z7901 Long term (current) use of anticoagulants: Secondary | ICD-10-CM

## 2012-02-15 DIAGNOSIS — R5383 Other fatigue: Secondary | ICD-10-CM

## 2012-02-15 LAB — POCT INR: INR: 3

## 2012-02-15 NOTE — Progress Notes (Signed)
[name] Date of Birth:  18-May-1930 [facility] [address] [place], [place]  [address] [phone]         Fax   [phone]  History of Present Illness: This pleasant 78 year old woman is seen for a scheduled followup office visit.  She was last seen on 01/08/12 at which time she was found to be in atrial fibrillation.  She herself was not aware of the arrhythmia.  She has been on long-term Coumadin for several years after suffering a stroke while vacationing in [place].  At that time the physicians felt that the stroke was cardioembolic although she was in normal sinus rhythm.  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  She also has mild mitral valve prolapse.  She had a nuclear stress test May 2011 which showed no regional wall motion abnormalities and no ischemia.  She complains of shortness of breath with exertion and easy fatigue.  She has had a dry cough.  She has not been experiencing any recent chest discomfort or angina.  Current Outpatient Prescriptions  Medication Sig Dispense Refill  . amiodarone (PACERONE) 200 MG tablet Take 200 mg by mouth. 1/2 tablet daily      . amLODipine (NORVASC) 5 MG tablet TAKE ONE TABLET BY MOUTH EVERY DAY  90 tablet  3  . aspirin 81 MG tablet Take 81 mg by mouth daily.        Marland Kitchen atorvastatin (LIPITOR) 20 MG tablet Take 1 tablet (20 mg total) by mouth daily.  90 each  3  . B-Complex-C-Biotin-Minerals-FA (FOLBEE PLUS CZ) 5 MG TABS Take 1 tablet (5 mg total) by mouth daily.  30 each  11  . Calcium Carbonate-Vitamin D (CALCIUM 600 + D PO) Take by mouth daily.        . diazepam (VALIUM) 2 MG tablet TAKE ONE TABLET BY MOUTH EVERY DAY AS NEEDED  100 tablet  0  . furosemide (LASIX) 20 MG tablet Take 20 mg by mouth as needed.        . nadolol (CORGARD) 40 MG tablet TAKE ONE TABLET BY MOUTH EVERY DAY  90 tablet  3  . nitroGLYCERIN  (NITROSTAT) 0.4 MG SL tablet Place 0.4 mg under the tongue every 5 (five) minutes as needed.        Marland Kitchen olmesartan-hydrochlorothiazide (BENICAR HCT) 40-25 MG per tablet Take 1 tablet by mouth as directed. Take 1/2 daily      . warfarin (COUMADIN) 5 MG tablet Take as directed by anticoagulation clinic  30 tablet  3  . DISCONTD: amiodarone (PACERONE) 200 MG tablet Take 1 tablet (200 mg total) by mouth daily.  30 tablet  5    Allergies  Allergen Reactions  . Cozaar   . Hydralazine   . Lisinopril   . Losartan Potassium-Hctz   . Sulfa Drugs Cross Reactors     Patient Active Problem List  Diagnoses  . CVA (cerebral infarction)  . Benign hypertensive heart disease without heart failure  . Hypercholesterolemia  . Atrial fibrillation    History  Smoking status  . Former Smoker  . Quit date: 10/19/1998  Smokeless tobacco  . Not on file    History  Alcohol Use No    Family History  Problem Relation Age of Onset  . Heart disease Mother   . Hypertension Mother   . Stroke Mother   . Heart disease Father   . Heart  failure Brother     Review of Systems: Constitutional: no fever chills diaphoresis or fatigue or change in weight.  Head and neck: no hearing loss, no epistaxis, no photophobia or visual disturbance. Respiratory: No cough, shortness of breath or wheezing. Cardiovascular: No chest pain peripheral edema, palpitations. Gastrointestinal: No abdominal distention, no abdominal pain, no change in bowel habits hematochezia or melena. Genitourinary: No dysuria, no frequency, no urgency, no nocturia. Musculoskeletal:No arthralgias, no back pain, no gait disturbance or myalgias. Neurological: No dizziness, no headaches, no numbness, no seizures, no syncope, no weakness, no tremors. Hematologic: No lymphadenopathy, no easy bruising. Psychiatric: No confusion, no hallucinations, no sleep disturbance.    Physical Exam: Filed Vitals:   02/15/12 1527  BP: 136/84  Pulse: 44    general appearance reveals a well-developed well-nourished middle-age woman in no distress.The head and neck exam reveals pupils equal and reactive.  Extraocular movements are full.  There is no scleral icterus.  The mouth and pharynx are normal.  The neck is supple.  The carotids reveal no bruits.  The jugular venous pressure is normal.  The  thyroid is not enlarged.  There is no lymphadenopathy.  The chest is clear to percussion and auscultation.  There are no rales or rhonchi.  Expansion of the chest is symmetrical.  The precordium is quiet.  The first heart sound is normal.  The second heart sound is physiologically split.  There is no murmur gallop rub or click.  There is no abnormal lift or heave.  The abdomen is soft and nontender.  The bowel sounds are normal.  The liver and spleen are not enlarged.  There are no abdominal masses.  There are no abdominal bruits.  Extremities reveal good pedal pulses.  There is no phlebitis or edema.  There is no cyanosis or clubbing.  Strength is normal and symmetrical in all extremities.  There is no lateralizing weakness.  There are no sensory deficits.  The skin is warm and dry.  There is no rash.  EKG shows sinus bradycardia with first-degree AV block.   Assessment / Plan: We are reducing her Pacerone to just one half tablet daily.  We are checking lab work today including CBC TSH basal metabolic panel and hepatic function panel. Recheck in 3 months for office visit EKG.

## 2012-02-15 NOTE — Patient Instructions (Signed)
Decrease your Amiodarone to 1/2 tablet daily  Will obtain labs today and call you with the results (cbc/tsh/bmet/hfp)  Your physician recommends that you schedule a follow-up appointment in: 3 month ov and EKG

## 2012-02-15 NOTE — Assessment & Plan Note (Signed)
She is on long-term Coumadin anticoagulation.  EKG today shows that she is now back in normal sinus rhythm.  She is in marked sinus bradycardia at 44 per minute.  She apparently converted over the past month on an outpatient low-dose amiodarone 200 mg one daily.  Because of her bradycardia we will reduce the dose to just 100 mg daily

## 2012-02-15 NOTE — Assessment & Plan Note (Signed)
She has not had any TIA symptoms or new neurologic symptoms

## 2012-02-15 NOTE — Assessment & Plan Note (Signed)
Blood pressure has been stable on current therapy. 

## 2012-02-16 LAB — CBC WITH DIFFERENTIAL/PLATELET
Basophils Absolute: 0 10*3/uL (ref 0.0–0.1)
Basophils Relative: 0.6 % (ref 0.0–3.0)
Eosinophils Absolute: 0.2 10*3/uL (ref 0.0–0.7)
Eosinophils Relative: 2.7 % (ref 0.0–5.0)
HCT: 41.9 % (ref 36.0–46.0)
Hemoglobin: 14.2 g/dL (ref 12.0–15.0)
Lymphocytes Relative: 30.8 % (ref 12.0–46.0)
Lymphs Abs: 2.4 10*3/uL (ref 0.7–4.0)
MCHC: 33.8 g/dL (ref 30.0–36.0)
MCV: 95.1 fl (ref 78.0–100.0)
Monocytes Absolute: 0.6 10*3/uL (ref 0.1–1.0)
Monocytes Relative: 7.7 % (ref 3.0–12.0)
Neutro Abs: 4.4 10*3/uL (ref 1.4–7.7)
Neutrophils Relative %: 58.2 % (ref 43.0–77.0)
Platelets: 264 10*3/uL (ref 150.0–400.0)
RBC: 4.41 Mil/uL (ref 3.87–5.11)
RDW: 14 % (ref 11.5–14.6)
WBC: 7.6 10*3/uL (ref 4.5–10.5)

## 2012-02-16 LAB — BASIC METABOLIC PANEL
BUN: 12 mg/dL (ref 6–23)
CO2: 29 mEq/L (ref 19–32)
Calcium: 9.5 mg/dL (ref 8.4–10.5)
Chloride: 95 mEq/L — ABNORMAL LOW (ref 96–112)
Creatinine, Ser: 1.2 mg/dL (ref 0.4–1.2)
GFR: 48.24 mL/min — ABNORMAL LOW (ref >60.00)
Glucose, Bld: 97 mg/dL (ref 70–99)
Potassium: 3.9 mEq/L (ref 3.5–5.1)
Sodium: 134 mEq/L — ABNORMAL LOW (ref 135–145)

## 2012-02-16 LAB — HEPATIC FUNCTION PANEL
ALT: 27 U/L (ref 0–35)
AST: 35 U/L (ref 0–37)
Albumin: 4.2 g/dL (ref 3.5–5.2)
Alkaline Phosphatase: 83 U/L (ref 39–117)
Bilirubin, Direct: 0.2 mg/dL (ref 0.0–0.3)
Total Bilirubin: 0.9 mg/dL (ref 0.3–1.2)
Total Protein: 7.6 g/dL (ref 6.0–8.3)

## 2012-02-16 LAB — TSH: TSH: 2.7 u[IU]/mL (ref 0.35–5.50)

## 2012-02-16 NOTE — Progress Notes (Signed)
Quick Note:  Please report to patient. The recent labs are stable. Continue same medication and careful diet. No anemia. Thyroid normal. K normal. ______

## 2012-02-17 ENCOUNTER — Telehealth: Payer: Self-pay | Admitting: *Deleted

## 2012-02-17 NOTE — Telephone Encounter (Signed)
Message copied by Burnell Blanks on Wed Feb 17, 2012  4:32 PM ------      Message from: Cassell Clement      Created: Tue Feb 16, 2012  7:30 PM       Please report to patient.  The recent labs are stable. Continue same medication and careful diet. No anemia. Thyroid normal. K normal.

## 2012-02-17 NOTE — Telephone Encounter (Signed)
Advised of labs 

## 2012-02-17 NOTE — Telephone Encounter (Signed)
Message copied by Burnell Blanks on Wed Feb 17, 2012  4:33 PM ------      Message from: Cassell Clement      Created: Tue Feb 16, 2012  7:30 PM       Please report to patient.  The recent labs are stable. Continue same medication and careful diet. No anemia. Thyroid normal. K normal.

## 2012-02-29 ENCOUNTER — Ambulatory Visit (INDEPENDENT_AMBULATORY_CARE_PROVIDER_SITE_OTHER): Payer: Medicare Other | Admitting: *Deleted

## 2012-02-29 DIAGNOSIS — I6350 Cerebral infarction due to unspecified occlusion or stenosis of unspecified cerebral artery: Secondary | ICD-10-CM | POA: Diagnosis not present

## 2012-02-29 DIAGNOSIS — Z7901 Long term (current) use of anticoagulants: Secondary | ICD-10-CM

## 2012-02-29 DIAGNOSIS — I4891 Unspecified atrial fibrillation: Secondary | ICD-10-CM | POA: Diagnosis not present

## 2012-02-29 DIAGNOSIS — I639 Cerebral infarction, unspecified: Secondary | ICD-10-CM

## 2012-02-29 LAB — POCT INR: INR: 1.7

## 2012-03-03 DIAGNOSIS — H18519 Endothelial corneal dystrophy, unspecified eye: Secondary | ICD-10-CM | POA: Diagnosis not present

## 2012-03-03 DIAGNOSIS — H524 Presbyopia: Secondary | ICD-10-CM | POA: Diagnosis not present

## 2012-03-03 DIAGNOSIS — H259 Unspecified age-related cataract: Secondary | ICD-10-CM | POA: Diagnosis not present

## 2012-03-03 DIAGNOSIS — H40039 Anatomical narrow angle, unspecified eye: Secondary | ICD-10-CM | POA: Diagnosis not present

## 2012-03-17 ENCOUNTER — Ambulatory Visit (INDEPENDENT_AMBULATORY_CARE_PROVIDER_SITE_OTHER): Payer: Medicare Other | Admitting: *Deleted

## 2012-03-17 DIAGNOSIS — I4891 Unspecified atrial fibrillation: Secondary | ICD-10-CM

## 2012-03-17 DIAGNOSIS — I6350 Cerebral infarction due to unspecified occlusion or stenosis of unspecified cerebral artery: Secondary | ICD-10-CM | POA: Diagnosis not present

## 2012-03-17 DIAGNOSIS — Z7901 Long term (current) use of anticoagulants: Secondary | ICD-10-CM

## 2012-03-17 DIAGNOSIS — I639 Cerebral infarction, unspecified: Secondary | ICD-10-CM

## 2012-03-17 LAB — POCT INR: INR: 1.9

## 2012-03-30 ENCOUNTER — Ambulatory Visit (INDEPENDENT_AMBULATORY_CARE_PROVIDER_SITE_OTHER): Payer: Medicare Other | Admitting: *Deleted

## 2012-03-30 DIAGNOSIS — I4891 Unspecified atrial fibrillation: Secondary | ICD-10-CM

## 2012-03-30 DIAGNOSIS — I639 Cerebral infarction, unspecified: Secondary | ICD-10-CM

## 2012-03-30 DIAGNOSIS — I6350 Cerebral infarction due to unspecified occlusion or stenosis of unspecified cerebral artery: Secondary | ICD-10-CM | POA: Diagnosis not present

## 2012-03-30 DIAGNOSIS — Z7901 Long term (current) use of anticoagulants: Secondary | ICD-10-CM

## 2012-03-30 LAB — POCT INR: INR: 2.7

## 2012-04-04 ENCOUNTER — Telehealth: Payer: Self-pay | Admitting: Cardiology

## 2012-04-04 NOTE — Telephone Encounter (Signed)
Called patient and she states she did not have a Echo in April and she is being billed for it.  She says she only had an Coumadin Visit on 01/22/12 however she did call and request the results a little over a week latter.  I went and got Suzie and she knows most of Dr Yevonne Pax patients and she talked with her.  I will forward to [place] to discuss when Juliette Alcide returns next week

## 2012-04-04 NOTE — Telephone Encounter (Signed)
New msg Pt wants to talk to you about test she didn't have done

## 2012-04-15 NOTE — Telephone Encounter (Signed)
Discussed with patient.  She states she still doesn't remember having echo however she found on her calendar that it was scheduled April 5 with labs

## 2012-04-18 ENCOUNTER — Other Ambulatory Visit: Payer: Self-pay | Admitting: Cardiology

## 2012-04-18 NOTE — Telephone Encounter (Signed)
Refilled nadolol and furosemide

## 2012-04-20 ENCOUNTER — Ambulatory Visit (INDEPENDENT_AMBULATORY_CARE_PROVIDER_SITE_OTHER): Payer: Medicare Other | Admitting: *Deleted

## 2012-04-20 ENCOUNTER — Other Ambulatory Visit: Payer: Self-pay | Admitting: Cardiology

## 2012-04-20 DIAGNOSIS — I639 Cerebral infarction, unspecified: Secondary | ICD-10-CM

## 2012-04-20 DIAGNOSIS — I6350 Cerebral infarction due to unspecified occlusion or stenosis of unspecified cerebral artery: Secondary | ICD-10-CM | POA: Diagnosis not present

## 2012-04-20 DIAGNOSIS — Z7901 Long term (current) use of anticoagulants: Secondary | ICD-10-CM

## 2012-04-20 DIAGNOSIS — I4891 Unspecified atrial fibrillation: Secondary | ICD-10-CM | POA: Diagnosis not present

## 2012-04-20 LAB — POCT INR: INR: 3.4

## 2012-04-20 NOTE — Telephone Encounter (Signed)
Refilled benicar 

## 2012-04-29 ENCOUNTER — Ambulatory Visit: Payer: Medicare Other | Admitting: Cardiology

## 2012-05-17 DIAGNOSIS — I4891 Unspecified atrial fibrillation: Secondary | ICD-10-CM | POA: Diagnosis not present

## 2012-05-17 LAB — PROTIME-INR: INR: 3.7 — AB (ref 0.9–1.1)

## 2012-05-18 ENCOUNTER — Ambulatory Visit: Payer: Self-pay | Admitting: Cardiology

## 2012-05-18 DIAGNOSIS — I639 Cerebral infarction, unspecified: Secondary | ICD-10-CM

## 2012-05-30 DIAGNOSIS — R05 Cough: Secondary | ICD-10-CM | POA: Diagnosis not present

## 2012-05-30 DIAGNOSIS — I119 Hypertensive heart disease without heart failure: Secondary | ICD-10-CM | POA: Diagnosis not present

## 2012-05-30 DIAGNOSIS — I6350 Cerebral infarction due to unspecified occlusion or stenosis of unspecified cerebral artery: Secondary | ICD-10-CM | POA: Diagnosis not present

## 2012-05-30 DIAGNOSIS — R059 Cough, unspecified: Secondary | ICD-10-CM | POA: Diagnosis not present

## 2012-05-30 DIAGNOSIS — I4891 Unspecified atrial fibrillation: Secondary | ICD-10-CM | POA: Diagnosis not present

## 2012-05-30 LAB — PROTIME-INR: INR: 3.9 — AB (ref 0.9–1.1)

## 2012-05-31 ENCOUNTER — Ambulatory Visit: Payer: Self-pay | Admitting: Cardiovascular Disease

## 2012-05-31 DIAGNOSIS — I639 Cerebral infarction, unspecified: Secondary | ICD-10-CM

## 2012-06-07 ENCOUNTER — Ambulatory Visit (INDEPENDENT_AMBULATORY_CARE_PROVIDER_SITE_OTHER): Payer: Medicare Other | Admitting: Pharmacist

## 2012-06-07 DIAGNOSIS — I6350 Cerebral infarction due to unspecified occlusion or stenosis of unspecified cerebral artery: Secondary | ICD-10-CM | POA: Diagnosis not present

## 2012-06-07 DIAGNOSIS — I639 Cerebral infarction, unspecified: Secondary | ICD-10-CM

## 2012-06-07 DIAGNOSIS — I4891 Unspecified atrial fibrillation: Secondary | ICD-10-CM | POA: Diagnosis not present

## 2012-06-07 DIAGNOSIS — Z7901 Long term (current) use of anticoagulants: Secondary | ICD-10-CM | POA: Diagnosis not present

## 2012-06-07 LAB — POCT INR: INR: 2.7

## 2012-06-21 ENCOUNTER — Ambulatory Visit (INDEPENDENT_AMBULATORY_CARE_PROVIDER_SITE_OTHER): Payer: Medicare Other | Admitting: *Deleted

## 2012-06-21 DIAGNOSIS — I4891 Unspecified atrial fibrillation: Secondary | ICD-10-CM

## 2012-06-21 DIAGNOSIS — I639 Cerebral infarction, unspecified: Secondary | ICD-10-CM

## 2012-06-21 DIAGNOSIS — I6350 Cerebral infarction due to unspecified occlusion or stenosis of unspecified cerebral artery: Secondary | ICD-10-CM | POA: Diagnosis not present

## 2012-06-21 DIAGNOSIS — Z7901 Long term (current) use of anticoagulants: Secondary | ICD-10-CM | POA: Diagnosis not present

## 2012-06-21 LAB — POCT INR: INR: 2.2

## 2012-06-23 ENCOUNTER — Other Ambulatory Visit: Payer: Self-pay | Admitting: Cardiology

## 2012-07-12 ENCOUNTER — Ambulatory Visit (INDEPENDENT_AMBULATORY_CARE_PROVIDER_SITE_OTHER): Payer: Medicare Other | Admitting: *Deleted

## 2012-07-12 DIAGNOSIS — I6350 Cerebral infarction due to unspecified occlusion or stenosis of unspecified cerebral artery: Secondary | ICD-10-CM | POA: Diagnosis not present

## 2012-07-12 DIAGNOSIS — I4891 Unspecified atrial fibrillation: Secondary | ICD-10-CM | POA: Diagnosis not present

## 2012-07-12 DIAGNOSIS — Z7901 Long term (current) use of anticoagulants: Secondary | ICD-10-CM

## 2012-07-12 DIAGNOSIS — I639 Cerebral infarction, unspecified: Secondary | ICD-10-CM

## 2012-07-12 LAB — POCT INR: INR: 1.8

## 2012-07-21 ENCOUNTER — Ambulatory Visit (INDEPENDENT_AMBULATORY_CARE_PROVIDER_SITE_OTHER): Payer: Medicare Other | Admitting: *Deleted

## 2012-07-21 ENCOUNTER — Ambulatory Visit (INDEPENDENT_AMBULATORY_CARE_PROVIDER_SITE_OTHER): Payer: Medicare Other | Admitting: Cardiology

## 2012-07-21 ENCOUNTER — Encounter: Payer: Self-pay | Admitting: Cardiology

## 2012-07-21 VITALS — BP 129/65 | HR 43 | Ht 62.0 in | Wt 142.0 lb

## 2012-07-21 DIAGNOSIS — R059 Cough, unspecified: Secondary | ICD-10-CM | POA: Diagnosis not present

## 2012-07-21 DIAGNOSIS — I6350 Cerebral infarction due to unspecified occlusion or stenosis of unspecified cerebral artery: Secondary | ICD-10-CM

## 2012-07-21 DIAGNOSIS — I4891 Unspecified atrial fibrillation: Secondary | ICD-10-CM | POA: Diagnosis not present

## 2012-07-21 DIAGNOSIS — R05 Cough: Secondary | ICD-10-CM

## 2012-07-21 DIAGNOSIS — I119 Hypertensive heart disease without heart failure: Secondary | ICD-10-CM

## 2012-07-21 DIAGNOSIS — I639 Cerebral infarction, unspecified: Secondary | ICD-10-CM

## 2012-07-21 DIAGNOSIS — Z7901 Long term (current) use of anticoagulants: Secondary | ICD-10-CM

## 2012-07-21 LAB — POCT INR: INR: 2.6

## 2012-07-21 MED ORDER — HYDROCHLOROTHIAZIDE 12.5 MG PO CAPS: 12.5000 mg | ORAL_CAPSULE | Freq: Every day | ORAL | Status: DC

## 2012-07-21 NOTE — Assessment & Plan Note (Signed)
The patient has had no further TIA episodes.  However she does have no recollection whatsoever of having had her echocardiogram in April.

## 2012-07-21 NOTE — Assessment & Plan Note (Signed)
Blood pressure has been remaining stable on current therapy.  Her weight is down 5 pounds since last visit.  She has developed a dry tickling cough which is probably related to Benicar.  In the past she has not been able to tolerate ACE inhibitors or losartan.  We will stop her Benicar HCT and start HCTZ 12.5 mg one daily and see if cough resolves

## 2012-07-21 NOTE — Progress Notes (Signed)
[name] Date of Birth:  1930-08-01 [facility] [address] [place], [place]  [address] [phone]         Fax   [phone]  History of Present Illness: This pleasant 78 year old woman is seen for a scheduled followup office visit. She was last seen on 01/08/12 at which time she was found to be in atrial fibrillation. She herself was not aware of the arrhythmia. She has been on long-term Coumadin for several years after suffering a stroke while vacationing in [place]. At that time the physicians felt that the stroke was cardioembolic although she was in normal sinus rhythm. 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 She also has mild mitral valve prolapse. She had a nuclear stress test May 2011 which showed no regional wall motion abnormalities and no ischemia. She complains of shortness of breath with exertion and easy fatigue. She has had a dry cough. She has not been experiencing any recent chest discomfort or angina.  The patient had a chest x-ray on 01/08/12 which showed clear lung fields but mild cardiomegaly and she went on to have a echocardiogram on 01/22/12 which showed mild biatrial enlargement and her ejection fraction was 50-55%.   Current Outpatient Prescriptions  Medication Sig Dispense Refill  . amiodarone (PACERONE) 200 MG tablet Take 200 mg by mouth. 1/2 tablet daily      . amLODipine (NORVASC) 5 MG tablet TAKE ONE TABLET BY MOUTH EVERY DAY  90 tablet  3  . aspirin 81 MG tablet Take 81 mg by mouth daily.        Marland Kitchen atorvastatin (LIPITOR) 20 MG tablet Take 1 tablet (20 mg total) by mouth daily.  90 each  3  . B-Complex-C-Biotin-Minerals-FA (FOLBEE PLUS CZ) 5 MG TABS Take 1 tablet (5 mg total) by mouth daily.  30 each  11  . Calcium Carbonate-Vitamin D (CALCIUM 600 + D PO) Take by mouth daily.        . diazepam (VALIUM) 2 MG tablet TAKE ONE TABLET BY  MOUTH EVERY DAY AS NEEDED  100 tablet  0  . furosemide (LASIX) 20 MG tablet TAKE ONE TABLET BY MOUTH EVERY DAY AS NEEDED FOR EDEMA  30 tablet  11  . nadolol (CORGARD) 40 MG tablet TAKE ONE TABLET BY MOUTH EVERY DAY  90 tablet  3  . nitroGLYCERIN (NITROSTAT) 0.4 MG SL tablet Place 0.4 mg under the tongue every 5 (five) minutes as needed.        Marland Kitchen olmesartan-hydrochlorothiazide (BENICAR HCT) 40-25 MG per tablet Take 1 tablet by mouth as directed. Take 1/2 daily      . warfarin (COUMADIN) 5 MG tablet TAKE AS DIRECTED BY ANTICOAGULATION CLINIC  30 tablet  3  . DISCONTD: BENICAR HCT 40-25 MG per tablet TAKE ONE TABLET BY MOUTH EVERY DAY  90 each  3  . hydrochlorothiazide (MICROZIDE) 12.5 MG capsule Take 1 capsule (12.5 mg total) by mouth daily.  30 capsule  6    Allergies  Allergen Reactions  . Cozaar   . Hydralazine   . Hyzaar (Losartan Potassium-Hctz)   . Lisinopril   . Losartan Potassium-Hctz   . Sulfa Drugs Cross Reactors     Patient Active Problem List  Diagnosis  . CVA (cerebral infarction)  . Benign hypertensive heart disease without heart failure  . Hypercholesterolemia  . Atrial fibrillation    History  Smoking status  . Former  Smoker  . Quit date: 10/19/1998  Smokeless tobacco  . Not on file    History  Alcohol Use No    Family History  Problem Relation Age of Onset  . Heart disease Mother   . Hypertension Mother   . Stroke Mother   . Heart disease Father   . Heart failure Brother     Review of Systems: Constitutional: no fever chills diaphoresis or fatigue or change in weight.  Head and neck: no hearing loss, no epistaxis, no photophobia or visual disturbance. Respiratory: No cough, shortness of breath or wheezing. Cardiovascular: No chest pain peripheral edema, palpitations. Gastrointestinal: No abdominal distention, no abdominal pain, no change in bowel habits hematochezia or melena. Genitourinary: No dysuria, no frequency, no urgency, no  nocturia. Musculoskeletal:No arthralgias, no back pain, no gait disturbance or myalgias. Neurological: No dizziness, no headaches, no numbness, no seizures, no syncope, no weakness, no tremors. Hematologic: No lymphadenopathy, no easy bruising. Psychiatric: No confusion, no hallucinations, no sleep disturbance.    Physical Exam: Filed Vitals:   07/21/12 1619  BP: 129/65  Pulse: 43   the general appearance reveals a well-developed well-nourished woman in no distress.The head and neck exam reveals pupils equal and reactive.  Extraocular movements are full.  There is no scleral icterus.  The mouth and pharynx are normal.  The neck is supple.  The carotids reveal no bruits.  The jugular venous pressure is normal.  The  thyroid is not enlarged.  There is no lymphadenopathy.  The chest is clear to percussion and auscultation.  There are no rales or rhonchi.  Expansion of the chest is symmetrical.  The precordium is quiet.  The first heart sound is normal.  The second heart sound is physiologically split.  There is no murmur gallop rub or click.  There is no abnormal lift or heave.  The abdomen is soft and nontender.  The bowel sounds are normal.  The liver and spleen are not enlarged.  There are no abdominal masses.  There are no abdominal bruits.  Extremities reveal good pedal pulses.  There is no phlebitis or edema.  There is no cyanosis or clubbing.  Strength is normal and symmetrical in all extremities.  There is no lateralizing weakness.  There are no sensory deficits.  The skin is warm and dry.  There is no rash.  EKG shows marked sinus bradycardia with left anterior fascicular block and nonspecific ST segment abnormalities   Assessment / Plan: Stop Benicar and see if cough resolves. Recheck in 4 months for followup office visit and EKG.

## 2012-07-21 NOTE — Assessment & Plan Note (Signed)
EKG today shows that the patient is back in sinus rhythm.  EKG shows marked sinus bradycardia with first degree block.  The patient has not been aware of any palpitations.  She has been taking low-dose amiodarone 100 mg daily to maintain normal sinus rhythm.

## 2012-07-21 NOTE — Patient Instructions (Addendum)
Stop Benicar/Hct   Start HCTZ 12.5 mg daily    Your physician recommends that you schedule a follow-up appointment in: 4 months with a EKG.

## 2012-08-10 ENCOUNTER — Other Ambulatory Visit: Payer: Self-pay | Admitting: Cardiology

## 2012-08-10 MED ORDER — WARFARIN SODIUM 5 MG PO TABS: ORAL_TABLET | ORAL | Status: DC

## 2012-08-10 NOTE — Addendum Note (Signed)
Addended by: Migdalia Dk on: 08/10/2012 04:35 PM   Modules accepted: Orders

## 2012-08-10 NOTE — Telephone Encounter (Signed)
Diazepam refill sent escribe, called spoke with pharmacist, gave verbal order to cancel refill sent through escribe.  Pharmacist states since this is a controlled substance it has to be a written rx.  Requested she resend the request to Dr Patty Sermons so he can address, she agreed.  Gave verbal order to refill Coumadin #90 with 1 refill Take as directed by anticoagulation clinic.

## 2012-08-11 ENCOUNTER — Ambulatory Visit (INDEPENDENT_AMBULATORY_CARE_PROVIDER_SITE_OTHER): Payer: Medicare Other | Admitting: *Deleted

## 2012-08-11 ENCOUNTER — Other Ambulatory Visit: Payer: Self-pay

## 2012-08-11 DIAGNOSIS — I4891 Unspecified atrial fibrillation: Secondary | ICD-10-CM

## 2012-08-11 DIAGNOSIS — I639 Cerebral infarction, unspecified: Secondary | ICD-10-CM

## 2012-08-11 DIAGNOSIS — Z7901 Long term (current) use of anticoagulants: Secondary | ICD-10-CM | POA: Diagnosis not present

## 2012-08-11 DIAGNOSIS — I6350 Cerebral infarction due to unspecified occlusion or stenosis of unspecified cerebral artery: Secondary | ICD-10-CM

## 2012-08-11 LAB — POCT INR: INR: 2.5

## 2012-08-11 NOTE — Telephone Encounter (Signed)
Called into pharmacy again  

## 2012-08-23 ENCOUNTER — Other Ambulatory Visit: Payer: Self-pay | Admitting: Cardiology

## 2012-09-08 ENCOUNTER — Other Ambulatory Visit: Payer: Self-pay | Admitting: *Deleted

## 2012-09-08 ENCOUNTER — Ambulatory Visit (INDEPENDENT_AMBULATORY_CARE_PROVIDER_SITE_OTHER): Payer: Medicare Other | Admitting: *Deleted

## 2012-09-08 ENCOUNTER — Other Ambulatory Visit: Payer: Medicare Other

## 2012-09-08 DIAGNOSIS — I6350 Cerebral infarction due to unspecified occlusion or stenosis of unspecified cerebral artery: Secondary | ICD-10-CM | POA: Diagnosis not present

## 2012-09-08 DIAGNOSIS — Z7901 Long term (current) use of anticoagulants: Secondary | ICD-10-CM | POA: Diagnosis not present

## 2012-09-08 DIAGNOSIS — R300 Dysuria: Secondary | ICD-10-CM

## 2012-09-08 DIAGNOSIS — Z79899 Other long term (current) drug therapy: Secondary | ICD-10-CM | POA: Diagnosis not present

## 2012-09-08 DIAGNOSIS — I639 Cerebral infarction, unspecified: Secondary | ICD-10-CM

## 2012-09-08 DIAGNOSIS — I4891 Unspecified atrial fibrillation: Secondary | ICD-10-CM

## 2012-09-08 LAB — POCT INR: INR: 2.9

## 2012-09-08 NOTE — Progress Notes (Signed)
Patient having problems urinating, will get urinlaysis  Also she never stopped Benicar but started HCTZ, will get bmet today as well

## 2012-09-09 ENCOUNTER — Telehealth: Payer: Self-pay | Admitting: *Deleted

## 2012-09-09 DIAGNOSIS — N390 Urinary tract infection, site not specified: Secondary | ICD-10-CM

## 2012-09-09 LAB — BASIC METABOLIC PANEL
BUN: 13 mg/dL (ref 6–23)
CO2: 31 mEq/L (ref 19–32)
Calcium: 9.3 mg/dL (ref 8.4–10.5)
Chloride: 92 mEq/L — ABNORMAL LOW (ref 96–112)
Creatinine, Ser: 1.3 mg/dL — ABNORMAL HIGH (ref 0.4–1.2)
GFR: 42.19 mL/min — ABNORMAL LOW (ref >60.00)
Glucose, Bld: 114 mg/dL — ABNORMAL HIGH (ref 70–99)
Potassium: 3.5 mEq/L (ref 3.5–5.1)
Sodium: 133 mEq/L — ABNORMAL LOW (ref 135–145)

## 2012-09-09 LAB — URINALYSIS WITH CULTURE, IF INDICATED
Bilirubin Urine: NEGATIVE
Ketones, ur: NEGATIVE
Nitrite: POSITIVE
Specific Gravity, Urine: 1.015 (ref 1.000–1.030)
Total Protein, Urine: NEGATIVE
Urine Glucose: NEGATIVE
Urobilinogen, UA: 0.2 (ref 0.0–1.0)
pH: 6 (ref 5.0–8.0)

## 2012-09-09 MED ORDER — CIPROFLOXACIN HCL 500 MG PO TABS: 500.0000 mg | ORAL_TABLET | Freq: Two times a day (BID) | ORAL | Status: DC

## 2012-09-09 NOTE — Telephone Encounter (Signed)
Message copied by Burnell Blanks on Fri Sep 09, 2012  6:20 PM ------      Message from: Cassell Clement      Created: Fri Sep 09, 2012 12:22 PM       Please report.  Her basal metabolic panel is stable.  Her blood sugars slightly elevated which could be secondary to the bladder infection.      She has a bladder infection.  We will go ahead and treat her with Cipro 500 mg twice a day.  #10.  If the culture comes back showing different sensitivities we can change the antibiotic later.  Drink plenty of water

## 2012-09-09 NOTE — Telephone Encounter (Signed)
Advised patient and she will skip tomorrows coumadin and only take 2.5 mg on Monday. Rx sent to pharmacy

## 2012-09-20 ENCOUNTER — Telehealth: Payer: Self-pay | Admitting: Cardiology

## 2012-09-20 NOTE — Telephone Encounter (Signed)
Pt calling re test that was made recently, culture was to be done and would like results

## 2012-09-20 NOTE — Telephone Encounter (Signed)
Spoke with the lab and culture was not done. Spoke with patient and she is feeling much better. Advised patient would not have her come back unless symptoms came back, she will call back if any.

## 2012-09-20 NOTE — Telephone Encounter (Signed)
Agree with plan 

## 2012-10-06 ENCOUNTER — Ambulatory Visit (INDEPENDENT_AMBULATORY_CARE_PROVIDER_SITE_OTHER): Payer: Medicare Other | Admitting: *Deleted

## 2012-10-06 DIAGNOSIS — I4891 Unspecified atrial fibrillation: Secondary | ICD-10-CM | POA: Diagnosis not present

## 2012-10-06 DIAGNOSIS — I6350 Cerebral infarction due to unspecified occlusion or stenosis of unspecified cerebral artery: Secondary | ICD-10-CM | POA: Diagnosis not present

## 2012-10-06 DIAGNOSIS — I639 Cerebral infarction, unspecified: Secondary | ICD-10-CM

## 2012-10-06 DIAGNOSIS — Z7901 Long term (current) use of anticoagulants: Secondary | ICD-10-CM

## 2012-10-06 LAB — POCT INR: INR: 2.5

## 2012-10-10 ENCOUNTER — Other Ambulatory Visit: Payer: Self-pay

## 2012-10-10 DIAGNOSIS — E785 Hyperlipidemia, unspecified: Secondary | ICD-10-CM

## 2012-10-10 MED ORDER — ATORVASTATIN CALCIUM 20 MG PO TABS: 20.0000 mg | ORAL_TABLET | Freq: Every day | ORAL | Status: DC

## 2012-10-31 DIAGNOSIS — H259 Unspecified age-related cataract: Secondary | ICD-10-CM | POA: Diagnosis not present

## 2012-10-31 DIAGNOSIS — H40039 Anatomical narrow angle, unspecified eye: Secondary | ICD-10-CM | POA: Diagnosis not present

## 2012-11-03 ENCOUNTER — Ambulatory Visit (INDEPENDENT_AMBULATORY_CARE_PROVIDER_SITE_OTHER): Payer: Medicare Other | Admitting: *Deleted

## 2012-11-03 DIAGNOSIS — I4891 Unspecified atrial fibrillation: Secondary | ICD-10-CM | POA: Diagnosis not present

## 2012-11-03 DIAGNOSIS — Z7901 Long term (current) use of anticoagulants: Secondary | ICD-10-CM | POA: Diagnosis not present

## 2012-11-03 DIAGNOSIS — I6350 Cerebral infarction due to unspecified occlusion or stenosis of unspecified cerebral artery: Secondary | ICD-10-CM | POA: Diagnosis not present

## 2012-11-03 DIAGNOSIS — I639 Cerebral infarction, unspecified: Secondary | ICD-10-CM

## 2012-11-03 LAB — POCT INR: INR: 2.2

## 2012-11-23 ENCOUNTER — Telehealth: Payer: Self-pay | Admitting: Cardiology

## 2012-11-23 MED ORDER — AMIODARONE HCL 200 MG PO TABS: ORAL_TABLET | ORAL | Status: DC

## 2012-11-23 NOTE — Telephone Encounter (Signed)
New problem    pacerone  200 mg   [place]  - [facility] [phone].

## 2012-11-28 ENCOUNTER — Ambulatory Visit: Payer: Medicare Other | Admitting: Cardiology

## 2012-11-30 ENCOUNTER — Ambulatory Visit: Payer: Medicare Other | Admitting: Cardiology

## 2012-12-08 DIAGNOSIS — Z1231 Encounter for screening mammogram for malignant neoplasm of breast: Secondary | ICD-10-CM | POA: Diagnosis not present

## 2012-12-12 ENCOUNTER — Other Ambulatory Visit: Payer: Self-pay | Admitting: *Deleted

## 2012-12-12 MED ORDER — FOLBEE PLUS CZ 5 MG PO TABS: 5.0000 mg | ORAL_TABLET | Freq: Every day | ORAL | Status: DC

## 2012-12-15 ENCOUNTER — Ambulatory Visit (INDEPENDENT_AMBULATORY_CARE_PROVIDER_SITE_OTHER): Payer: Medicare Other | Admitting: *Deleted

## 2012-12-15 ENCOUNTER — Encounter: Payer: Self-pay | Admitting: Cardiology

## 2012-12-15 ENCOUNTER — Ambulatory Visit (INDEPENDENT_AMBULATORY_CARE_PROVIDER_SITE_OTHER): Payer: Medicare Other | Admitting: Cardiology

## 2012-12-15 VITALS — BP 122/74 | HR 42 | Ht 62.0 in | Wt 143.0 lb

## 2012-12-15 DIAGNOSIS — I4891 Unspecified atrial fibrillation: Secondary | ICD-10-CM

## 2012-12-15 DIAGNOSIS — I639 Cerebral infarction, unspecified: Secondary | ICD-10-CM

## 2012-12-15 DIAGNOSIS — I119 Hypertensive heart disease without heart failure: Secondary | ICD-10-CM

## 2012-12-15 DIAGNOSIS — I6350 Cerebral infarction due to unspecified occlusion or stenosis of unspecified cerebral artery: Secondary | ICD-10-CM

## 2012-12-15 DIAGNOSIS — Z7901 Long term (current) use of anticoagulants: Secondary | ICD-10-CM

## 2012-12-15 DIAGNOSIS — E7800 Pure hypercholesterolemia, unspecified: Secondary | ICD-10-CM

## 2012-12-15 DIAGNOSIS — I480 Paroxysmal atrial fibrillation: Secondary | ICD-10-CM

## 2012-12-15 DIAGNOSIS — I4819 Other persistent atrial fibrillation: Secondary | ICD-10-CM | POA: Insufficient documentation

## 2012-12-15 LAB — POCT INR: INR: 2.6

## 2012-12-15 MED ORDER — NADOLOL 20 MG PO TABS: 20.0000 mg | ORAL_TABLET | Freq: Every day | ORAL | Status: DC

## 2012-12-15 NOTE — Assessment & Plan Note (Signed)
Blood pressure was remaining stable on current medication.  No headaches

## 2012-12-15 NOTE — Assessment & Plan Note (Signed)
The patient has a history of hypercholesterolemia and is on low-dose Lipitor 10 mg daily.  She is not having any myalgias or side effects.

## 2012-12-15 NOTE — Patient Instructions (Addendum)
DECREASE YOUR NADOLOL TO 20 MG DAILY (TAKE 1/2 OF YOU 40 MG UNTIL FINISHED AND THEN START THE 20 MG TABLETS THAT HAVE BEEN SENT TO [id number])  Your physician recommends that you schedule a follow-up appointment in: 4 MONTH OV/EKG

## 2012-12-15 NOTE — Assessment & Plan Note (Signed)
The patient has had no recurrence of her atrial fibrillation.  EKG today shows persistent marked bradycardia and we will decrease her nadolol to just 20 mg daily to allow higher heart rate.  She has been having lightheadedness at times which is probably due to her marked bradycardia with heart rate of 42.

## 2012-12-15 NOTE — Progress Notes (Signed)
[name] Date of Birth:  02-22-1930 [facility] [address] [place], [place]  [address] [phone]         Fax   [phone]  History of Present Illness: This pleasant 79 year old woman is seen for a scheduled followup office visit. She was  seen on 01/08/12 at which time she was found to be in atrial fibrillation. She herself was not aware of the arrhythmia. She has been on long-term Coumadin for several years after suffering a stroke while vacationing in [place]. At that time the physicians felt that the stroke was cardioembolic although she was in normal sinus rhythm. 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 She also has mild mitral valve prolapse. She had a nuclear stress test May 2011 which showed no regional wall motion abnormalities and no ischemia. She complains of shortness of breath with exertion and easy fatigue. She had had had a dry cough which has resolved after she stopped her ARB. She has not been experiencing any recent chest discomfort or angina. The patient had a chest x-ray on 01/08/12 which showed clear lung fields but mild cardiomegaly and she went on to have a echocardiogram on 01/22/12 which showed mild biatrial enlargement and her ejection fraction was 50-55%.   Current Outpatient Prescriptions  Medication Sig Dispense Refill  . amiodarone (PACERONE) 200 MG tablet 1/2 tablet daily  30 tablet  3  . amLODipine (NORVASC) 5 MG tablet TAKE ONE TABLET BY MOUTH EVERY DAY  90 tablet  2  . aspirin 81 MG tablet Take 81 mg by mouth daily.        Marland Kitchen atorvastatin (LIPITOR) 20 MG tablet Take 1 tablet (20 mg total) by mouth daily.  90 tablet  1  . B-Complex-C-Biotin-Minerals-FA (FOLBEE PLUS CZ) 5 MG TABS Take 1 tablet (5 mg total) by mouth daily.  30 each  6  . Calcium Carbonate-Vitamin D (CALCIUM 600 + D PO) Take by mouth daily.        . diazepam  (VALIUM) 2 MG tablet TAKE ONE TABLET BY MOUTH EVERY DAY AS NEEDED  90 tablet  1  . furosemide (LASIX) 20 MG tablet TAKE ONE TABLET BY MOUTH EVERY DAY AS NEEDED FOR EDEMA  30 tablet  11  . hydrochlorothiazide (MICROZIDE) 12.5 MG capsule Take 1 capsule (12.5 mg total) by mouth daily.  30 capsule  6  . nadolol (CORGARD) 20 MG tablet Take 1 tablet (20 mg total) by mouth daily.  90 tablet  3  . nitroGLYCERIN (NITROSTAT) 0.4 MG SL tablet Place 0.4 mg under the tongue every 5 (five) minutes as needed.        . warfarin (COUMADIN) 5 MG tablet Take as directed by anticoagulation clinic  90 tablet  1   No current facility-administered medications for this visit.    Allergies  Allergen Reactions  . Cozaar   . Hydralazine   . Hyzaar (Losartan Potassium-Hctz)   . Lisinopril   . Losartan Potassium-Hctz   . Sulfa Drugs Cross Reactors     Patient Active Problem List  Diagnosis  . CVA (cerebral infarction)  . Benign hypertensive heart disease without heart failure  . Hypercholesterolemia  . Atrial fibrillation  . PAF (paroxysmal atrial fibrillation)    History  Smoking status  . Former Smoker  . Quit date: 10/19/1998  Smokeless tobacco  . Not on file    History  Alcohol Use No  Family History  Problem Relation Age of Onset  . Heart disease Mother   . Hypertension Mother   . Stroke Mother   . Heart disease Father   . Heart failure Brother     Review of Systems: Constitutional: no fever chills diaphoresis or fatigue or change in weight.  Head and neck: no hearing loss, no epistaxis, no photophobia or visual disturbance. Respiratory: No cough, shortness of breath or wheezing. Cardiovascular: No chest pain peripheral edema, palpitations. Gastrointestinal: No abdominal distention, no abdominal pain, no change in bowel habits hematochezia or melena. Genitourinary: No dysuria, no frequency, no urgency, no nocturia. Musculoskeletal:No arthralgias, no back pain, no gait disturbance  or myalgias. Neurological: No dizziness, no headaches, no numbness, no seizures, no syncope, no weakness, no tremors. Hematologic: No lymphadenopathy, no easy bruising. Psychiatric: No confusion, no hallucinations, no sleep disturbance.    Physical Exam: Filed Vitals:   12/15/12 1546  BP: 122/74  Pulse: 42   the general appearance reveals a well-developed well-nourished woman in no distress.  Today on her way into the office she lost her footing and slipped to the ground and has minor abrasions of her right wrist and her right knee and right elbow.  There was no actual break in the skin, just a little erythema in these should heal without any specific therapy.The head and neck exam reveals pupils equal and reactive.  Extraocular movements are full.  There is no scleral icterus.  The mouth and pharynx are normal.  The neck is supple.  The carotids reveal no bruits.  The jugular venous pressure is normal.  The  thyroid is not enlarged.  There is no lymphadenopathy.  The chest is clear to percussion and auscultation.  There are no rales or rhonchi.  Expansion of the chest is symmetrical.  The precordium is quiet.  The first heart sound is normal.  The second heart sound is physiologically split.  There is no murmur gallop rub or click.  There is no abnormal lift or heave.  The abdomen is soft and nontender.  The bowel sounds are normal.  The liver and spleen are not enlarged.  There are no abdominal masses.  There are no abdominal bruits.  Extremities reveal good pedal pulses.  There is no phlebitis or edema.  There is no cyanosis or clubbing.  Strength is normal and symmetrical in all extremities.  There is no lateralizing weakness.  There are no sensory deficits.  The skin is warm and dry.  There is no rash.  EKG shows marked sinus bradycardia with first degree AV block and left anterior fascicular block and no ischemic changes.   Assessment / Plan: Continue same dose of Pacerone but decreased  nadolol 20 mg one daily.  Recheck in 4 months for followup office visit and EKG.

## 2013-01-16 DIAGNOSIS — H40039 Anatomical narrow angle, unspecified eye: Secondary | ICD-10-CM | POA: Diagnosis not present

## 2013-01-16 DIAGNOSIS — H259 Unspecified age-related cataract: Secondary | ICD-10-CM | POA: Diagnosis not present

## 2013-01-26 ENCOUNTER — Ambulatory Visit (INDEPENDENT_AMBULATORY_CARE_PROVIDER_SITE_OTHER): Payer: Medicare Other | Admitting: *Deleted

## 2013-01-26 DIAGNOSIS — Z7901 Long term (current) use of anticoagulants: Secondary | ICD-10-CM | POA: Diagnosis not present

## 2013-01-26 DIAGNOSIS — I6350 Cerebral infarction due to unspecified occlusion or stenosis of unspecified cerebral artery: Secondary | ICD-10-CM

## 2013-01-26 DIAGNOSIS — I4891 Unspecified atrial fibrillation: Secondary | ICD-10-CM | POA: Diagnosis not present

## 2013-01-26 DIAGNOSIS — I639 Cerebral infarction, unspecified: Secondary | ICD-10-CM

## 2013-01-26 LAB — POCT INR: INR: 2.2

## 2013-02-17 ENCOUNTER — Other Ambulatory Visit: Payer: Self-pay | Admitting: *Deleted

## 2013-02-17 MED ORDER — HYDROCHLOROTHIAZIDE 12.5 MG PO CAPS: 12.5000 mg | ORAL_CAPSULE | Freq: Every day | ORAL | Status: DC

## 2013-03-01 ENCOUNTER — Other Ambulatory Visit: Payer: Self-pay | Admitting: *Deleted

## 2013-03-01 MED ORDER — HYDROCHLOROTHIAZIDE 12.5 MG PO CAPS: 12.5000 mg | ORAL_CAPSULE | Freq: Every day | ORAL | Status: DC

## 2013-03-09 ENCOUNTER — Ambulatory Visit (INDEPENDENT_AMBULATORY_CARE_PROVIDER_SITE_OTHER): Payer: Medicare Other | Admitting: *Deleted

## 2013-03-09 DIAGNOSIS — I4891 Unspecified atrial fibrillation: Secondary | ICD-10-CM | POA: Diagnosis not present

## 2013-03-09 DIAGNOSIS — Z7901 Long term (current) use of anticoagulants: Secondary | ICD-10-CM | POA: Diagnosis not present

## 2013-03-09 DIAGNOSIS — I6350 Cerebral infarction due to unspecified occlusion or stenosis of unspecified cerebral artery: Secondary | ICD-10-CM

## 2013-03-09 DIAGNOSIS — I639 Cerebral infarction, unspecified: Secondary | ICD-10-CM

## 2013-03-09 LAB — POCT INR: INR: 2.4

## 2013-04-05 ENCOUNTER — Encounter: Payer: Self-pay | Admitting: Cardiology

## 2013-04-05 ENCOUNTER — Ambulatory Visit (INDEPENDENT_AMBULATORY_CARE_PROVIDER_SITE_OTHER): Payer: Medicare Other | Admitting: Cardiology

## 2013-04-05 VITALS — BP 130/76 | HR 44 | Ht 62.0 in | Wt 140.8 lb

## 2013-04-05 DIAGNOSIS — I119 Hypertensive heart disease without heart failure: Secondary | ICD-10-CM

## 2013-04-05 DIAGNOSIS — R079 Chest pain, unspecified: Secondary | ICD-10-CM

## 2013-04-05 DIAGNOSIS — I6350 Cerebral infarction due to unspecified occlusion or stenosis of unspecified cerebral artery: Secondary | ICD-10-CM

## 2013-04-05 DIAGNOSIS — I639 Cerebral infarction, unspecified: Secondary | ICD-10-CM

## 2013-04-05 DIAGNOSIS — I480 Paroxysmal atrial fibrillation: Secondary | ICD-10-CM

## 2013-04-05 DIAGNOSIS — I4891 Unspecified atrial fibrillation: Secondary | ICD-10-CM | POA: Diagnosis not present

## 2013-04-05 DIAGNOSIS — E785 Hyperlipidemia, unspecified: Secondary | ICD-10-CM | POA: Diagnosis not present

## 2013-04-05 DIAGNOSIS — E7800 Pure hypercholesterolemia, unspecified: Secondary | ICD-10-CM | POA: Diagnosis not present

## 2013-04-05 NOTE — Progress Notes (Signed)
[name] Date of Birth:  01-06-1930 [facility] [address] [place], [place]  [address] [phone]         Fax   [phone]  History of Present Illness: This pleasant 79 year old woman is seen for a scheduled followup office visit. She was seen on 01/08/12 at which time she was found to be in atrial fibrillation. She herself was not aware of the arrhythmia. She has been on long-term Coumadin for several years after suffering a stroke while vacationing in [place]. At that time the physicians felt that the stroke was cardioembolic although she was in normal sinus rhythm. 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 She also has mild mitral valve prolapse. She had a nuclear stress test May 2011 which showed no regional wall motion abnormalities and no ischemia. She complains of shortness of breath with exertion and easy fatigue. She had had had a dry cough which has resolved after she stopped her ARB. She has not been experiencing any recent chest discomfort to suggest angina. The patient had a chest x-ray on 01/08/12 which showed clear lung fields but mild cardiomegaly and she went on to have a echocardiogram on 01/22/12 which showed mild biatrial enlargement and her ejection fraction was 50-55%.   Current Outpatient Prescriptions  Medication Sig Dispense Refill  . amiodarone (PACERONE) 200 MG tablet 1/2 tablet daily  30 tablet  3  . amLODipine (NORVASC) 5 MG tablet TAKE ONE TABLET BY MOUTH EVERY DAY  90 tablet  2  . aspirin 81 MG tablet Take 81 mg by mouth daily.        Marland Kitchen atorvastatin (LIPITOR) 20 MG tablet Take 1 tablet (20 mg total) by mouth daily.  90 tablet  1  . B-Complex-C-Biotin-Minerals-FA (FOLBEE PLUS CZ) 5 MG TABS Take 1 tablet (5 mg total) by mouth daily.  30 each  6  . Calcium Carbonate-Vitamin D (CALCIUM 600 + D PO) Take by mouth daily.        . diazepam  (VALIUM) 2 MG tablet TAKE ONE TABLET BY MOUTH EVERY DAY AS NEEDED  90 tablet  1  . furosemide (LASIX) 20 MG tablet TAKE ONE TABLET BY MOUTH EVERY DAY AS NEEDED FOR EDEMA  30 tablet  11  . hydrochlorothiazide (MICROZIDE) 12.5 MG capsule Take 1 capsule (12.5 mg total) by mouth daily.  30 capsule  5  . nadolol (CORGARD) 20 MG tablet Take 1 tablet (20 mg total) by mouth daily.  90 tablet  3  . nitroGLYCERIN (NITROSTAT) 0.4 MG SL tablet Place 0.4 mg under the tongue every 5 (five) minutes as needed.        . warfarin (COUMADIN) 5 MG tablet Take as directed by anticoagulation clinic  90 tablet  1   No current facility-administered medications for this visit.    Allergies  Allergen Reactions  . Cozaar   . Hydralazine   . Hyzaar (Losartan Potassium-Hctz)   . Lisinopril   . Losartan Potassium-Hctz   . Sulfa Drugs Cross Reactors     Patient Active Problem List   Diagnosis Date Noted  . Benign hypertensive heart disease without heart failure 03/27/2011    Priority: High  . Hypercholesterolemia 03/27/2011    Priority: High  . PAF (paroxysmal atrial fibrillation) 12/15/2012  . Atrial fibrillation 01/08/2012  . CVA (cerebral infarction) 01/29/2011    History  Smoking status  . Former Smoker  . Quit date:  10/19/1998  Smokeless tobacco  . Not on file    History  Alcohol Use No    Family History  Problem Relation Age of Onset  . Heart disease Mother   . Hypertension Mother   . Stroke Mother   . Heart disease Father   . Heart failure Brother     Review of Systems: Constitutional: no fever chills diaphoresis or fatigue or change in weight.  Head and neck: no hearing loss, no epistaxis, no photophobia or visual disturbance. Respiratory: No cough, shortness of breath or wheezing. Cardiovascular: No chest pain peripheral edema, palpitations. Gastrointestinal: No abdominal distention, no abdominal pain, no change in bowel habits hematochezia or melena. Genitourinary: No dysuria, no  frequency, no urgency, no nocturia. Musculoskeletal:No arthralgias, no back pain, no gait disturbance or myalgias. Neurological: No dizziness, no headaches, no numbness, no seizures, no syncope, no weakness, no tremors. Hematologic: No lymphadenopathy, no easy bruising. Psychiatric: No confusion, no hallucinations, no sleep disturbance.    Physical Exam: Filed Vitals:   04/05/13 1606  BP: 130/76  Pulse: 44   the general appearance reveals a healthy-appearing woman in no distress.The head and neck exam reveals pupils equal and reactive.  Extraocular movements are full.  There is no scleral icterus.  The mouth and pharynx are normal.  The neck is supple.  The carotids reveal no bruits.  The jugular venous pressure is normal.  The  thyroid is not enlarged.  There is no lymphadenopathy.  The chest is clear to percussion and auscultation.  There are no rales or rhonchi.  Expansion of the chest is symmetrical.  The precordium is quiet.  The first heart sound is normal.  The second heart sound is physiologically split.  There is no murmur gallop rub or click.  There is no abnormal lift or heave.  The abdomen is soft and nontender.  The bowel sounds are normal.  The liver and spleen are not enlarged.  There are no abdominal masses.  There are no abdominal bruits.  Extremities reveal good pedal pulses.  There is no phlebitis and there is trace edema.  There is no cyanosis or clubbing.  Strength is normal and symmetrical in all extremities.  There is no lateralizing weakness.  There are no sensory deficits.  The skin is warm and dry.  There is no rash.  EKG shows marked sinus bradycardia at 44 per minute.  There is left axis deviation with nonspecific ST abnormality.  The tracing is unchanged from 12/15/12.  Assessment / Plan: Overall the patient is doing well.  She has been experiencing some musculoskeletal left anterolateral chest wall discomfort worse with twisting or turning.  The pain does not sound  ischemic.  She has some 2 mg Valium at home that she will try for muscle relaxation.  She may also try ibuprofen or Aleve short-term.  The patient will be rechecked in 4 months for followup office visit lipid panel hepatic function panel and basal metabolic panel.  Also because she is on amiodarone we will get TSH and CBC.

## 2013-04-05 NOTE — Assessment & Plan Note (Signed)
The patient has a history of hypercholesterolemia and is on Lipitor 20 mg daily.  She is not having any myalgias from Lipitor.  We'll plan to check fasting lab work at her next visit.

## 2013-04-05 NOTE — Patient Instructions (Addendum)
Your physician wants you to follow-up in: 4 MONTHS  You will receive a reminder letter in the mail two months in advance. If you don't receive a letter, please call our office to schedule the follow-up appointment.  Your physician recommends that you return for a FASTING lipid profile: 4 MONTHS LP HFP BMET TSH CBC  Your physician recommends that you continue on your current medications as directed. Please refer to the Current Medication list given to you today.

## 2013-04-05 NOTE — Assessment & Plan Note (Signed)
The patient has not been aware of any racing of her heart or palpitations.  Her EKG today confirms that she is maintaining normal sinus rhythm.  She has a history of sinus bradycardia secondary to beta blocker.  She is on long-term Coumadin because of her paroxysmal atrial fibrillation and her previous suspected embolic stroke.

## 2013-04-05 NOTE — Assessment & Plan Note (Signed)
Blood pressure was remaining stable on current therapy.  She is tolerating amlodipine with very slight dependent edema which goes down at night.

## 2013-04-05 NOTE — Assessment & Plan Note (Signed)
The patient has had no further TIA or stroke symptoms 

## 2013-04-19 ENCOUNTER — Other Ambulatory Visit: Payer: Self-pay | Admitting: Cardiology

## 2013-04-19 DIAGNOSIS — F419 Anxiety disorder, unspecified: Secondary | ICD-10-CM

## 2013-04-19 MED ORDER — DIAZEPAM 2 MG PO TABS: ORAL_TABLET | ORAL | Status: DC

## 2013-04-19 NOTE — Telephone Encounter (Signed)
Pt states she needs Valium refilled would like a call back regarding this

## 2013-04-20 ENCOUNTER — Ambulatory Visit (INDEPENDENT_AMBULATORY_CARE_PROVIDER_SITE_OTHER): Payer: Medicare Other | Admitting: *Deleted

## 2013-04-20 DIAGNOSIS — Z7901 Long term (current) use of anticoagulants: Secondary | ICD-10-CM | POA: Diagnosis not present

## 2013-04-20 DIAGNOSIS — I639 Cerebral infarction, unspecified: Secondary | ICD-10-CM

## 2013-04-20 DIAGNOSIS — I6350 Cerebral infarction due to unspecified occlusion or stenosis of unspecified cerebral artery: Secondary | ICD-10-CM

## 2013-04-20 DIAGNOSIS — I4891 Unspecified atrial fibrillation: Secondary | ICD-10-CM | POA: Diagnosis not present

## 2013-04-20 LAB — POCT INR: INR: 2.3

## 2013-04-23 ENCOUNTER — Other Ambulatory Visit: Payer: Self-pay | Admitting: Cardiology

## 2013-05-28 ENCOUNTER — Other Ambulatory Visit: Payer: Self-pay | Admitting: Cardiology

## 2013-05-30 DIAGNOSIS — I6350 Cerebral infarction due to unspecified occlusion or stenosis of unspecified cerebral artery: Secondary | ICD-10-CM | POA: Diagnosis not present

## 2013-05-30 DIAGNOSIS — Z7901 Long term (current) use of anticoagulants: Secondary | ICD-10-CM | POA: Diagnosis not present

## 2013-05-30 LAB — PROTIME-INR: INR: 2.7 — AB (ref <=1.1)

## 2013-05-31 ENCOUNTER — Ambulatory Visit (INDEPENDENT_AMBULATORY_CARE_PROVIDER_SITE_OTHER): Payer: Medicare Other | Admitting: Cardiovascular Disease

## 2013-05-31 ENCOUNTER — Encounter: Payer: Self-pay | Admitting: Cardiology

## 2013-05-31 DIAGNOSIS — I6350 Cerebral infarction due to unspecified occlusion or stenosis of unspecified cerebral artery: Secondary | ICD-10-CM

## 2013-05-31 DIAGNOSIS — I639 Cerebral infarction, unspecified: Secondary | ICD-10-CM

## 2013-07-11 ENCOUNTER — Ambulatory Visit (INDEPENDENT_AMBULATORY_CARE_PROVIDER_SITE_OTHER): Payer: Medicare Other | Admitting: *Deleted

## 2013-07-11 DIAGNOSIS — I6350 Cerebral infarction due to unspecified occlusion or stenosis of unspecified cerebral artery: Secondary | ICD-10-CM | POA: Diagnosis not present

## 2013-07-11 DIAGNOSIS — I639 Cerebral infarction, unspecified: Secondary | ICD-10-CM

## 2013-07-11 DIAGNOSIS — I4891 Unspecified atrial fibrillation: Secondary | ICD-10-CM | POA: Diagnosis not present

## 2013-07-11 DIAGNOSIS — Z7901 Long term (current) use of anticoagulants: Secondary | ICD-10-CM

## 2013-07-11 LAB — POCT INR: INR: 2.2

## 2013-07-27 ENCOUNTER — Other Ambulatory Visit: Payer: Self-pay

## 2013-07-27 MED ORDER — AMIODARONE HCL 200 MG PO TABS: ORAL_TABLET | ORAL | Status: DC

## 2013-08-03 ENCOUNTER — Ambulatory Visit (INDEPENDENT_AMBULATORY_CARE_PROVIDER_SITE_OTHER): Payer: Medicare Other | Admitting: Cardiology

## 2013-08-03 ENCOUNTER — Encounter: Payer: Self-pay | Admitting: Cardiology

## 2013-08-03 VITALS — BP 130/60 | HR 52 | Ht 62.0 in | Wt 142.0 lb

## 2013-08-03 DIAGNOSIS — R079 Chest pain, unspecified: Secondary | ICD-10-CM | POA: Diagnosis not present

## 2013-08-03 DIAGNOSIS — E785 Hyperlipidemia, unspecified: Secondary | ICD-10-CM | POA: Diagnosis not present

## 2013-08-03 DIAGNOSIS — I119 Hypertensive heart disease without heart failure: Secondary | ICD-10-CM

## 2013-08-03 DIAGNOSIS — E7800 Pure hypercholesterolemia, unspecified: Secondary | ICD-10-CM

## 2013-08-03 DIAGNOSIS — I4891 Unspecified atrial fibrillation: Secondary | ICD-10-CM | POA: Diagnosis not present

## 2013-08-03 DIAGNOSIS — I480 Paroxysmal atrial fibrillation: Secondary | ICD-10-CM

## 2013-08-03 LAB — TSH: TSH: 2.88 u[IU]/mL (ref 0.35–5.50)

## 2013-08-03 LAB — CBC
HCT: 41.7 % (ref 36.0–46.0)
Hemoglobin: 14.1 g/dL (ref 12.0–15.0)
MCHC: 33.7 g/dL (ref 30.0–36.0)
MCV: 94.3 fl (ref 78.0–100.0)
Platelets: 292 10*3/uL (ref 150.0–400.0)
RBC: 4.42 Mil/uL (ref 3.87–5.11)
RDW: 13.6 % (ref 11.5–14.6)
WBC: 8.8 10*3/uL (ref 4.5–10.5)

## 2013-08-03 LAB — BASIC METABOLIC PANEL
BUN: 16 mg/dL (ref 6–23)
CO2: 31 mEq/L (ref 19–32)
Calcium: 9.5 mg/dL (ref 8.4–10.5)
Chloride: 90 mEq/L — ABNORMAL LOW (ref 96–112)
Creatinine, Ser: 1.5 mg/dL — ABNORMAL HIGH (ref 0.4–1.2)
GFR: 36.78 mL/min — ABNORMAL LOW (ref >60.00)
Glucose, Bld: 116 mg/dL — ABNORMAL HIGH (ref 70–99)
Potassium: 3.6 mEq/L (ref 3.5–5.1)
Sodium: 131 mEq/L — ABNORMAL LOW (ref 135–145)

## 2013-08-03 LAB — LIPID PANEL
Cholesterol: 175 mg/dL (ref 0–200)
HDL: 82 mg/dL (ref >39.00)
LDL Cholesterol: 77 mg/dL (ref 0–99)
Total CHOL/HDL Ratio: 2
Triglycerides: 79 mg/dL (ref 0.0–149.0)
VLDL: 15.8 mg/dL (ref 0.0–40.0)

## 2013-08-03 LAB — HEPATIC FUNCTION PANEL
ALT: 30 U/L (ref 0–35)
AST: 41 U/L — ABNORMAL HIGH (ref 0–37)
Albumin: 4 g/dL (ref 3.5–5.2)
Alkaline Phosphatase: 79 U/L (ref 39–117)
Bilirubin, Direct: 0.1 mg/dL (ref 0.0–0.3)
Total Bilirubin: 0.6 mg/dL (ref 0.3–1.2)
Total Protein: 7.6 g/dL (ref 6.0–8.3)

## 2013-08-03 NOTE — Progress Notes (Signed)
[name] Date of Birth:  1930/08/31 [address] [facility] [address] [place], [place]  [address] [phone]         Fax   [phone]  History of Present Illness: This pleasant 79 year old woman is seen for a scheduled followup office visit. She was seen on 01/08/12 at which time she was found to be in atrial fibrillation. She herself was not aware of the arrhythmia. She has been on long-term Coumadin for several years after suffering a stroke while vacationing in [place]. At that time the physicians felt that the stroke was cardioembolic although she was in normal sinus rhythm. 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 She also has mild mitral valve prolapse. She had a nuclear stress test May 2011 which showed no regional wall motion abnormalities and no ischemia. She complains of shortness of breath with exertion and easy fatigue. She had had had a dry cough which has resolved after she stopped her ARB. She has not been experiencing any recent chest discomfort to suggest angina. The patient had a chest x-ray on 01/08/12 which showed clear lung fields but mild cardiomegaly and she went on to have a echocardiogram on 01/22/12 which showed mild biatrial enlargement and her ejection fraction was 50-55%.  Since last visit she has been doing well.  She notes occasional shortness of breath if she gets into much of a hurry.  She feels better if she takes a Valium and rests.  Current Outpatient Prescriptions  Medication Sig Dispense Refill  . amiodarone (PACERONE) 200 MG tablet 1/2 tablet daily  30 tablet  3  . amLODipine (NORVASC) 5 MG tablet TAKE ONE TABLET BY MOUTH EVERY DAY  90 tablet  3  . aspirin 81 MG tablet Take 81 mg by mouth daily.        Marland Kitchen atorvastatin (LIPITOR) 20 MG tablet Take 1 tablet (20 mg total) by mouth daily.  90 tablet  1  . B-Complex-C-Biotin-Minerals-FA (FOLBEE PLUS CZ) 5 MG  TABS Take 1 tablet (5 mg total) by mouth daily.  30 each  6  . Calcium Carbonate-Vitamin D (CALCIUM 600 + D PO) Take by mouth daily.        . diazepam (VALIUM) 2 MG tablet TAKE ONE TABLET BY MOUTH EVERY DAY AS NEEDED  90 tablet  1  . furosemide (LASIX) 20 MG tablet TAKE ONE TABLET BY MOUTH EVERY DAY AS NEEDED FOR EDEMA.  30 tablet  0  . hydrochlorothiazide (MICROZIDE) 12.5 MG capsule Take 1 capsule (12.5 mg total) by mouth daily.  30 capsule  5  . nadolol (CORGARD) 20 MG tablet Take 1 tablet (20 mg total) by mouth daily.  90 tablet  3  . nitroGLYCERIN (NITROSTAT) 0.4 MG SL tablet Place 0.4 mg under the tongue every 5 (five) minutes as needed.        . warfarin (COUMADIN) 5 MG tablet Take as directed by anticoagulation clinic  90 tablet  1   No current facility-administered medications for this visit.    Allergies  Allergen Reactions  . Cozaar   . Hydralazine   . Hyzaar [Losartan Potassium-Hctz]   . Lisinopril   . Losartan Potassium-Hctz   . Sulfa Drugs Cross Reactors     Patient Active Problem List   Diagnosis Date Noted  . Benign hypertensive heart disease without heart failure 03/27/2011    Priority: High  . Hypercholesterolemia 03/27/2011    Priority: High  .  PAF (paroxysmal atrial fibrillation) 12/15/2012  . Atrial fibrillation 01/08/2012  . CVA (cerebral infarction) 01/29/2011    History  Smoking status  . Former Smoker  . Quit date: 10/19/1998  Smokeless tobacco  . Not on file    History  Alcohol Use No    Family History  Problem Relation Age of Onset  . Heart disease Mother   . Hypertension Mother   . Stroke Mother   . Heart disease Father   . Heart failure Brother     Review of Systems: Constitutional: no fever chills diaphoresis or fatigue or change in weight.  Head and neck: no hearing loss, no epistaxis, no photophobia or visual disturbance. Respiratory: No cough, shortness of breath or wheezing. Cardiovascular: No chest pain peripheral edema,  palpitations. Gastrointestinal: No abdominal distention, no abdominal pain, no change in bowel habits hematochezia or melena. Genitourinary: No dysuria, no frequency, no urgency, no nocturia. Musculoskeletal:No arthralgias, no back pain, no gait disturbance or myalgias. Neurological: No dizziness, no headaches, no numbness, no seizures, no syncope, no weakness, no tremors. Hematologic: No lymphadenopathy, no easy bruising. Psychiatric: No confusion, no hallucinations, no sleep disturbance.    Physical Exam: Filed Vitals:   08/03/13 1345  BP: 130/60  Pulse: 52   the general appearance reveals a healthy-appearing woman in no distress.The head and neck exam reveals pupils equal and reactive.  Extraocular movements are full.  There is no scleral icterus.  The mouth and pharynx are normal.  The neck is supple.  The carotids reveal no bruits.  The jugular venous pressure is normal.  The  thyroid is not enlarged.  There is no lymphadenopathy.  The chest is clear to percussion and auscultation.  There are no rales or rhonchi.  Expansion of the chest is symmetrical.  The precordium is quiet.  The first heart sound is normal.  The second heart sound is physiologically split.  There is no murmur gallop rub or click.  There is no abnormal lift or heave.  The abdomen is soft and nontender.  The bowel sounds are normal.  The liver and spleen are not enlarged.  There are no abdominal masses.  There are no abdominal bruits.  Extremities reveal good pedal pulses.  There is no phlebitis and there is trace edema.  There is no cyanosis or clubbing.  Strength is normal and symmetrical in all extremities.  There is no lateralizing weakness.  There are no sensory deficits.  The skin is warm and dry.  There is no rash.    Assessment / Plan: Overall the patient is doing well.  She will continue same medication.  Blood work today pending.  Recheck in 4 months for office visit EKG and fasting lipid panel hepatic function  panel and basal metabolic panel.

## 2013-08-03 NOTE — Assessment & Plan Note (Signed)
Blood pressures remaining stable on current therapy. 

## 2013-08-03 NOTE — Assessment & Plan Note (Signed)
She is tolerating 20 mg Lipitor without side effects.  We are checking fasting labs today

## 2013-08-03 NOTE — Patient Instructions (Signed)
Will obtain labs today and call you with the results (lp/bmet/hfp/cbc/tsh)  Your physician recommends that you continue on your current medications as directed. Please refer to the Current Medication list given to you today.  Your physician wants you to follow-up in: 4 months with fasting labs (lp/bmet/hfp) and ekg You will receive a reminder letter in the mail two months in advance. If you don't receive a letter, please call our office to schedule the follow-up appointment.

## 2013-08-03 NOTE — Assessment & Plan Note (Signed)
The patient has not been aware of any recurrent atrial fibrillation.  No TIA symptoms

## 2013-08-03 NOTE — Progress Notes (Signed)
Quick Note:  Please report to patient. The recent labs are stable. Continue same medication and careful diet. The sodium is low. I want her to stop the HCTZ. She may continue to use occasional lasiix for edema etc. ______

## 2013-08-04 ENCOUNTER — Telehealth: Payer: Self-pay | Admitting: *Deleted

## 2013-08-04 NOTE — Telephone Encounter (Signed)
Advised patient and medication change

## 2013-08-04 NOTE — Telephone Encounter (Signed)
Message copied by Burnell Blanks on Fri Aug 04, 2013 11:01 AM ------      Message from: Cassell Clement      Created: Thu Aug 03, 2013  9:33 PM       Please report to patient.  The recent labs are stable. Continue same medication and careful diet. The sodium is low. I want her to stop the HCTZ.  She may continue to use occasional lasiix for edema etc. ------

## 2013-08-14 ENCOUNTER — Other Ambulatory Visit: Payer: Self-pay | Admitting: Cardiology

## 2013-08-15 ENCOUNTER — Other Ambulatory Visit: Payer: Self-pay | Admitting: *Deleted

## 2013-08-15 MED ORDER — NADOLOL 20 MG PO TABS: 20.0000 mg | ORAL_TABLET | Freq: Every day | ORAL | Status: DC

## 2013-08-22 ENCOUNTER — Ambulatory Visit (INDEPENDENT_AMBULATORY_CARE_PROVIDER_SITE_OTHER): Payer: Medicare Other | Admitting: *Deleted

## 2013-08-22 DIAGNOSIS — I4891 Unspecified atrial fibrillation: Secondary | ICD-10-CM | POA: Diagnosis not present

## 2013-08-22 DIAGNOSIS — I6350 Cerebral infarction due to unspecified occlusion or stenosis of unspecified cerebral artery: Secondary | ICD-10-CM | POA: Diagnosis not present

## 2013-08-22 DIAGNOSIS — I639 Cerebral infarction, unspecified: Secondary | ICD-10-CM

## 2013-08-22 DIAGNOSIS — Z7901 Long term (current) use of anticoagulants: Secondary | ICD-10-CM | POA: Diagnosis not present

## 2013-08-22 LAB — POCT INR: INR: 3.4

## 2013-08-28 ENCOUNTER — Other Ambulatory Visit: Payer: Self-pay | Admitting: Cardiology

## 2013-08-31 ENCOUNTER — Telehealth: Payer: Self-pay | Admitting: Cardiology

## 2013-08-31 ENCOUNTER — Telehealth: Payer: Self-pay | Admitting: *Deleted

## 2013-08-31 MED ORDER — OSELTAMIVIR PHOSPHATE 75 MG PO CAPS: 75.0000 mg | ORAL_CAPSULE | Freq: Every day | ORAL | Status: DC

## 2013-08-31 NOTE — Telephone Encounter (Signed)
Follow UP:  Pt is calling with questions about her Rx for the flu. Pt wants to know if it been sent. Please advise

## 2013-08-31 NOTE — Telephone Encounter (Signed)
PT was told she can take tamaflu as prescribed, pt wanted it ok'd by Dr Patty Sermons. I informed her he was not in the office today but saw my msg and OK'd it and he will be in office tomorrow.   She had issues with pharmacies, I informed her [facility], she agreed to pick it up there.

## 2013-08-31 NOTE — Telephone Encounter (Signed)
Sent to medications dept.

## 2013-08-31 NOTE — Telephone Encounter (Signed)
Agree with plan 

## 2013-08-31 NOTE — Telephone Encounter (Signed)
Dr Wylene Simmer called for DOD Dr Nahser,/ Dr Patty Sermons pt. Dr Wylene Simmer treats Mr [name] and needs the pts wife to be treated with  prophylactics for flu. Dr Wylene Simmer can not call in a script since he has never seen [name]. He would like [place] to call in Tamaflu 75 mg once daily [facility] [address]. Dr Elease Hashimoto agreed to fill med.

## 2013-09-12 ENCOUNTER — Ambulatory Visit (INDEPENDENT_AMBULATORY_CARE_PROVIDER_SITE_OTHER): Payer: Medicare Other | Admitting: *Deleted

## 2013-09-12 DIAGNOSIS — Z7901 Long term (current) use of anticoagulants: Secondary | ICD-10-CM

## 2013-09-12 DIAGNOSIS — I6350 Cerebral infarction due to unspecified occlusion or stenosis of unspecified cerebral artery: Secondary | ICD-10-CM

## 2013-09-12 DIAGNOSIS — I639 Cerebral infarction, unspecified: Secondary | ICD-10-CM

## 2013-09-12 DIAGNOSIS — I4891 Unspecified atrial fibrillation: Secondary | ICD-10-CM

## 2013-09-12 LAB — POCT INR: INR: 2.5

## 2013-10-03 ENCOUNTER — Other Ambulatory Visit: Payer: Self-pay | Admitting: Cardiology

## 2013-10-10 ENCOUNTER — Ambulatory Visit (INDEPENDENT_AMBULATORY_CARE_PROVIDER_SITE_OTHER): Payer: Medicare Other | Admitting: *Deleted

## 2013-10-10 DIAGNOSIS — I6350 Cerebral infarction due to unspecified occlusion or stenosis of unspecified cerebral artery: Secondary | ICD-10-CM | POA: Diagnosis not present

## 2013-10-10 DIAGNOSIS — I4891 Unspecified atrial fibrillation: Secondary | ICD-10-CM | POA: Diagnosis not present

## 2013-10-10 DIAGNOSIS — Z23 Encounter for immunization: Secondary | ICD-10-CM | POA: Diagnosis not present

## 2013-10-10 DIAGNOSIS — Z7901 Long term (current) use of anticoagulants: Secondary | ICD-10-CM | POA: Diagnosis not present

## 2013-10-10 DIAGNOSIS — I639 Cerebral infarction, unspecified: Secondary | ICD-10-CM

## 2013-10-10 LAB — POCT INR: INR: 2

## 2013-11-07 ENCOUNTER — Ambulatory Visit (INDEPENDENT_AMBULATORY_CARE_PROVIDER_SITE_OTHER): Payer: Medicare Other | Admitting: *Deleted

## 2013-11-07 DIAGNOSIS — I639 Cerebral infarction, unspecified: Secondary | ICD-10-CM

## 2013-11-07 DIAGNOSIS — Z7901 Long term (current) use of anticoagulants: Secondary | ICD-10-CM | POA: Diagnosis not present

## 2013-11-07 DIAGNOSIS — I6350 Cerebral infarction due to unspecified occlusion or stenosis of unspecified cerebral artery: Secondary | ICD-10-CM | POA: Diagnosis not present

## 2013-11-07 DIAGNOSIS — I4891 Unspecified atrial fibrillation: Secondary | ICD-10-CM

## 2013-11-07 LAB — POCT INR: INR: 3.3

## 2013-11-28 ENCOUNTER — Encounter: Payer: Self-pay | Admitting: Cardiology

## 2013-11-28 ENCOUNTER — Ambulatory Visit (INDEPENDENT_AMBULATORY_CARE_PROVIDER_SITE_OTHER): Payer: Medicare Other

## 2013-11-28 ENCOUNTER — Ambulatory Visit (INDEPENDENT_AMBULATORY_CARE_PROVIDER_SITE_OTHER): Payer: Medicare Other | Admitting: Cardiology

## 2013-11-28 VITALS — BP 132/71 | HR 43 | Ht 62.0 in | Wt 136.0 lb

## 2013-11-28 DIAGNOSIS — I6350 Cerebral infarction due to unspecified occlusion or stenosis of unspecified cerebral artery: Secondary | ICD-10-CM

## 2013-11-28 DIAGNOSIS — Z7901 Long term (current) use of anticoagulants: Secondary | ICD-10-CM

## 2013-11-28 DIAGNOSIS — I4891 Unspecified atrial fibrillation: Secondary | ICD-10-CM

## 2013-11-28 DIAGNOSIS — I119 Hypertensive heart disease without heart failure: Secondary | ICD-10-CM | POA: Diagnosis not present

## 2013-11-28 DIAGNOSIS — N390 Urinary tract infection, site not specified: Secondary | ICD-10-CM | POA: Insufficient documentation

## 2013-11-28 DIAGNOSIS — Z5181 Encounter for therapeutic drug level monitoring: Secondary | ICD-10-CM | POA: Insufficient documentation

## 2013-11-28 DIAGNOSIS — I639 Cerebral infarction, unspecified: Secondary | ICD-10-CM

## 2013-11-28 DIAGNOSIS — I480 Paroxysmal atrial fibrillation: Secondary | ICD-10-CM

## 2013-11-28 LAB — POCT INR: INR: 2.1

## 2013-11-28 NOTE — Assessment & Plan Note (Signed)
The patient has been experiencing right flank discomfort for the past several weeks.  She is concerned she might have a bladder infection or kidney infection.  We will check a urinalysis today.  We will also check a CBC.  She has a past history of hyponatremia and we will followup with a basal metabolic panel today to see if her hyponatremia has improved since stopping hydrochlorothiazide

## 2013-11-28 NOTE — Patient Instructions (Addendum)
Will obtain labs today and call you with the results (BMET/CBC/URINALYSIS)  DECREASE YOUR NADOLOL TO 20 MG 1/2 TABLET DAILY   Your physician recommends that you schedule a follow-up appointment in: 4 MONTH OV

## 2013-11-28 NOTE — Progress Notes (Signed)
[name] Date of Birth:  1930/05/28 [address] [facility] [address] [address] [place], [place]  [id number] [phone]         Fax   [phone]  History of Present Illness: This pleasant 80 year old woman is seen for a scheduled followup office visit. She was seen on 01/08/12 at which time she was found to be in atrial fibrillation. She herself was not aware of the arrhythmia. She has been on long-term Coumadin for several years after suffering a stroke while vacationing in [place]. At that time the physicians felt that the stroke was cardioembolic although she was in normal sinus rhythm. 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 She also has mild mitral valve prolapse. She had a nuclear stress test May 2011 which showed no regional wall motion abnormalities and no ischemia. She complains of shortness of breath with exertion and easy fatigue. She had had had a dry cough which has resolved after she stopped her ARB. She has not been experiencing any recent chest discomfort to suggest angina. The patient had a chest x-ray on 01/08/12 which showed clear lung fields but mild cardiomegaly and she went on to have a echocardiogram on 01/22/12 which showed mild biatrial enlargement and her ejection fraction was 50-55%.  Since last visit she has been doing well.  She notes occasional shortness of breath if she gets into much of a hurry.  She feels better if she takes a Valium and rests.  She has had some decrease in energy she may be related to her bradycardia.  Current Outpatient Prescriptions  Medication Sig Dispense Refill  . amiodarone (PACERONE) 200 MG tablet 1/2 tablet daily  30 tablet  3  . amLODipine (NORVASC) 5 MG tablet TAKE ONE TABLET BY MOUTH EVERY DAY  90 tablet  3  . aspirin 81 MG tablet Take 81 mg by mouth daily.        Marland Kitchen. atorvastatin (LIPITOR) 20 MG tablet TAKE ONE TABLET BY MOUTH DAILY.  90  tablet  0  . B-Complex-C-Biotin-Minerals-FA (FOLBEE PLUS CZ) 5 MG TABS Take 5 mg by mouth every other day.      . Calcium Carbonate-Vitamin D (CALCIUM 600 + D PO) Take by mouth daily.        . diazepam (VALIUM) 2 MG tablet TAKE ONE TABLET BY MOUTH EVERY DAY AS NEEDED  90 tablet  1  . furosemide (LASIX) 20 MG tablet TAKE ONE TABLET BY MOUTH EVERY DAY AS NEEDED FOR EDEMA.  30 tablet  0  . nadolol (CORGARD) 20 MG tablet Take 20 mg by mouth as directed. 1/2 TABLET DAILY      . nitroGLYCERIN (NITROSTAT) 0.4 MG SL tablet Place 0.4 mg under the tongue every 5 (five) minutes as needed.        . warfarin (COUMADIN) 5 MG tablet Take as directed by anticoagulation clinic  90 tablet  1   No current facility-administered medications for this visit.    Allergies  Allergen Reactions  . Cozaar   . Hydralazine   . Hyzaar [Losartan Potassium-Hctz]   . Lisinopril   . Losartan Potassium-Hctz   . Sulfa Drugs Cross Reactors     Patient Active Problem List   Diagnosis Date Noted  . Benign hypertensive heart disease without heart failure 03/27/2011    Priority: High  . Hypercholesterolemia 03/27/2011    Priority: High  . Encounter for therapeutic drug monitoring 11/28/2013  .  PAF (paroxysmal atrial fibrillation) 12/15/2012  . Atrial fibrillation 01/08/2012  . CVA (cerebral infarction) 01/29/2011    History  Smoking status  . Former Smoker  . Quit date: 10/19/1998  Smokeless tobacco  . Not on file    History  Alcohol Use No    Family History  Problem Relation Age of Onset  . Heart disease Mother   . Hypertension Mother   . Stroke Mother   . Heart disease Father   . Heart failure Brother     Review of Systems: Constitutional: no fever chills diaphoresis or fatigue or change in weight.  Head and neck: no hearing loss, no epistaxis, no photophobia or visual disturbance. Respiratory: No cough, shortness of breath or wheezing. Cardiovascular: No chest pain peripheral edema,  palpitations. Gastrointestinal: No abdominal distention, no abdominal pain, no change in bowel habits hematochezia or melena. Genitourinary: No dysuria, no frequency, no urgency, no nocturia. Musculoskeletal:No arthralgias, no back pain, no gait disturbance or myalgias. Neurological: No dizziness, no headaches, no numbness, no seizures, no syncope, no weakness, no tremors. Hematologic: No lymphadenopathy, no easy bruising. Psychiatric: No confusion, no hallucinations, no sleep disturbance.    Physical Exam: Filed Vitals:   11/28/13 1510  BP: 132/71  Pulse: 43   the general appearance reveals a healthy-appearing woman in no distress.The head and neck exam reveals pupils equal and reactive.  Extraocular movements are full.  There is no scleral icterus.  The mouth and pharynx are normal.  The neck is supple.  The carotids reveal no bruits.  The jugular venous pressure is normal.  The  thyroid is not enlarged.  There is no lymphadenopathy.  The chest is clear to percussion and auscultation.  There are no rales or rhonchi.  Expansion of the chest is symmetrical.  The precordium is quiet.  The first heart sound is normal.  The second heart sound is physiologically split.  There is no murmur gallop rub or click.  There is no abnormal lift or heave.  The abdomen is soft and nontender.  The bowel sounds are normal.  The liver and spleen are not enlarged.  There are no abdominal masses.  There are no abdominal bruits.  Extremities reveal good pedal pulses.  There is no phlebitis and there is trace edema.  There is no cyanosis or clubbing.  Strength is normal and symmetrical in all extremities.  There is no lateralizing weakness.  There are no sensory deficits.  The skin is warm and dry.  There is no rash.  EKG shows marked sinus bradycardia with first degree AV block  Assessment / Plan: Continue same medication except because of bradycardia we will reduce her nadolol to just half its present dose.  Recheck  in 4 months for followup office visit and EKG

## 2013-11-28 NOTE — Assessment & Plan Note (Signed)
The patient has had no TIA or stroke symptoms.

## 2013-11-28 NOTE — Assessment & Plan Note (Signed)
The patient remains on long-term Coumadin.  She herself has not been aware of any racing of her heart or atrial fibrillation recurrence.  She is on long-term amiodarone.  EKG today shows sinus bradycardia

## 2013-11-28 NOTE — Assessment & Plan Note (Signed)
Blood pressure has been remaining stable on current therapy. 

## 2013-11-29 ENCOUNTER — Telehealth: Payer: Self-pay | Admitting: *Deleted

## 2013-11-29 DIAGNOSIS — I4891 Unspecified atrial fibrillation: Secondary | ICD-10-CM

## 2013-11-29 DIAGNOSIS — E876 Hypokalemia: Secondary | ICD-10-CM

## 2013-11-29 DIAGNOSIS — N390 Urinary tract infection, site not specified: Secondary | ICD-10-CM

## 2013-11-29 LAB — BASIC METABOLIC PANEL
BUN: 15 mg/dL (ref 6–23)
CO2: 31 mEq/L (ref 19–32)
Calcium: 9.2 mg/dL (ref 8.4–10.5)
Chloride: 97 mEq/L (ref 96–112)
Creatinine, Ser: 1.2 mg/dL (ref 0.4–1.2)
GFR: 45.73 mL/min — ABNORMAL LOW (ref >60.00)
Glucose, Bld: 81 mg/dL (ref 70–99)
Potassium: 3.3 mEq/L — ABNORMAL LOW (ref 3.5–5.1)
Sodium: 138 mEq/L (ref 135–145)

## 2013-11-29 LAB — CBC WITH DIFFERENTIAL/PLATELET
Basophils Absolute: 0.1 10*3/uL (ref 0.0–0.1)
Basophils Relative: 0.7 % (ref 0.0–3.0)
Eosinophils Absolute: 0.3 10*3/uL (ref 0.0–0.7)
Eosinophils Relative: 2.8 % (ref 0.0–5.0)
HCT: 44.6 % (ref 36.0–46.0)
Hemoglobin: 14.1 g/dL (ref 12.0–15.0)
Lymphocytes Relative: 23.9 % (ref 12.0–46.0)
Lymphs Abs: 2.3 10*3/uL (ref 0.7–4.0)
MCHC: 31.7 g/dL (ref 30.0–36.0)
MCV: 97.3 fl (ref 78.0–100.0)
Monocytes Absolute: 0.5 10*3/uL (ref 0.1–1.0)
Monocytes Relative: 5.7 % (ref 3.0–12.0)
Neutro Abs: 6.4 10*3/uL (ref 1.4–7.7)
Neutrophils Relative %: 66.9 % (ref 43.0–77.0)
Platelets: 339 10*3/uL (ref 150.0–400.0)
RBC: 4.58 Mil/uL (ref 3.87–5.11)
RDW: 14.5 % (ref 11.5–14.6)
WBC: 9.5 10*3/uL (ref 4.5–10.5)

## 2013-11-29 LAB — URINALYSIS, ROUTINE W REFLEX MICROSCOPIC
Bilirubin Urine: NEGATIVE
Hgb urine dipstick: NEGATIVE
Ketones, ur: NEGATIVE
Nitrite: NEGATIVE
Specific Gravity, Urine: 1.02 (ref 1.000–1.030)
Urine Glucose: NEGATIVE
Urobilinogen, UA: 0.2 (ref 0.0–1.0)
pH: 6 (ref 5.0–8.0)

## 2013-11-29 MED ORDER — WARFARIN SODIUM 5 MG PO TABS: ORAL_TABLET | ORAL | Status: DC

## 2013-11-29 MED ORDER — AMOXICILLIN 500 MG PO CAPS: 500.0000 mg | ORAL_CAPSULE | Freq: Three times a day (TID) | ORAL | Status: DC

## 2013-11-29 MED ORDER — POTASSIUM CHLORIDE ER 10 MEQ PO TBCR: 10.0000 meq | EXTENDED_RELEASE_TABLET | Freq: Every day | ORAL | Status: DC

## 2013-11-29 NOTE — Telephone Encounter (Signed)
Message copied by Burnell BlanksPRATT, Stacia Feazell B on Wed Nov 29, 2013  6:19 PM ------      Message from: Cassell ClementBRACKBILL, THOMAS      Created: Wed Nov 29, 2013  1:03 PM       Please report to patient.  The recent labs are stable. Continue same medication and careful diet.  The potassium is low.  Add Klor-Con 10 mEq generic one twice a day.      The urinalysis suggests a urinary tract infection.  Add Cipro 250 mg twice a day for 7 days.  Drink plenty of fluids.  Her serum sodium has returned to normal since stopping HCTZ. ------

## 2013-11-29 NOTE — Progress Notes (Signed)
Quick Note:  Please report to patient. The recent labs are stable. Continue same medication and careful diet. The potassium is low. Add Klor-Con 10 mEq generic one twice a day. The urinalysis suggests a urinary tract infection. Add Cipro 250 mg twice a day for 7 days. Drink plenty of fluids. Her serum sodium has returned to normal since stopping HCTZ. ______

## 2013-11-29 NOTE — Telephone Encounter (Signed)
Cipro not recommended with Amiodarone. Discussed with  Dr. Patty SermonsBrackbill and will switch to Amoxil 500 mg TID x 5 days, advised patient

## 2013-12-26 ENCOUNTER — Ambulatory Visit (INDEPENDENT_AMBULATORY_CARE_PROVIDER_SITE_OTHER): Payer: Medicare Other

## 2013-12-26 DIAGNOSIS — I6350 Cerebral infarction due to unspecified occlusion or stenosis of unspecified cerebral artery: Secondary | ICD-10-CM

## 2013-12-26 DIAGNOSIS — I4891 Unspecified atrial fibrillation: Secondary | ICD-10-CM

## 2013-12-26 DIAGNOSIS — Z7901 Long term (current) use of anticoagulants: Secondary | ICD-10-CM | POA: Diagnosis not present

## 2013-12-26 DIAGNOSIS — Z5181 Encounter for therapeutic drug level monitoring: Secondary | ICD-10-CM | POA: Diagnosis not present

## 2013-12-26 DIAGNOSIS — I639 Cerebral infarction, unspecified: Secondary | ICD-10-CM

## 2013-12-26 LAB — POCT INR: INR: 2.9

## 2014-01-23 ENCOUNTER — Ambulatory Visit (INDEPENDENT_AMBULATORY_CARE_PROVIDER_SITE_OTHER): Payer: Medicare Other | Admitting: Pharmacist Clinician (PhC)/ Clinical Pharmacy Specialist

## 2014-01-23 DIAGNOSIS — I639 Cerebral infarction, unspecified: Secondary | ICD-10-CM

## 2014-01-23 DIAGNOSIS — Z5181 Encounter for therapeutic drug level monitoring: Secondary | ICD-10-CM

## 2014-01-23 DIAGNOSIS — I4891 Unspecified atrial fibrillation: Secondary | ICD-10-CM

## 2014-01-23 DIAGNOSIS — Z7901 Long term (current) use of anticoagulants: Secondary | ICD-10-CM

## 2014-01-23 DIAGNOSIS — I6350 Cerebral infarction due to unspecified occlusion or stenosis of unspecified cerebral artery: Secondary | ICD-10-CM | POA: Diagnosis not present

## 2014-01-23 LAB — POCT INR: INR: 1.7

## 2014-02-13 ENCOUNTER — Ambulatory Visit (INDEPENDENT_AMBULATORY_CARE_PROVIDER_SITE_OTHER): Payer: Medicare Other | Admitting: *Deleted

## 2014-02-13 DIAGNOSIS — I4891 Unspecified atrial fibrillation: Secondary | ICD-10-CM

## 2014-02-13 DIAGNOSIS — Z7901 Long term (current) use of anticoagulants: Secondary | ICD-10-CM | POA: Diagnosis not present

## 2014-02-13 DIAGNOSIS — Z5181 Encounter for therapeutic drug level monitoring: Secondary | ICD-10-CM

## 2014-02-13 DIAGNOSIS — I639 Cerebral infarction, unspecified: Secondary | ICD-10-CM

## 2014-02-13 DIAGNOSIS — I6350 Cerebral infarction due to unspecified occlusion or stenosis of unspecified cerebral artery: Secondary | ICD-10-CM | POA: Diagnosis not present

## 2014-02-13 LAB — POCT INR: INR: 2.2

## 2014-02-14 ENCOUNTER — Other Ambulatory Visit: Payer: Self-pay | Admitting: Cardiology

## 2014-03-13 ENCOUNTER — Ambulatory Visit (INDEPENDENT_AMBULATORY_CARE_PROVIDER_SITE_OTHER): Payer: Medicare Other | Admitting: *Deleted

## 2014-03-13 DIAGNOSIS — I4891 Unspecified atrial fibrillation: Secondary | ICD-10-CM | POA: Diagnosis not present

## 2014-03-13 DIAGNOSIS — Z5181 Encounter for therapeutic drug level monitoring: Secondary | ICD-10-CM

## 2014-03-13 DIAGNOSIS — I6350 Cerebral infarction due to unspecified occlusion or stenosis of unspecified cerebral artery: Secondary | ICD-10-CM | POA: Diagnosis not present

## 2014-03-13 DIAGNOSIS — Z7901 Long term (current) use of anticoagulants: Secondary | ICD-10-CM

## 2014-03-13 DIAGNOSIS — I639 Cerebral infarction, unspecified: Secondary | ICD-10-CM

## 2014-03-13 LAB — POCT INR: INR: 2.2

## 2014-03-14 ENCOUNTER — Other Ambulatory Visit: Payer: Self-pay | Admitting: Cardiology

## 2014-03-27 ENCOUNTER — Other Ambulatory Visit: Payer: Self-pay | Admitting: Cardiology

## 2014-03-28 ENCOUNTER — Encounter: Payer: Self-pay | Admitting: Cardiology

## 2014-03-28 ENCOUNTER — Ambulatory Visit (INDEPENDENT_AMBULATORY_CARE_PROVIDER_SITE_OTHER): Payer: Medicare Other | Admitting: *Deleted

## 2014-03-28 ENCOUNTER — Ambulatory Visit (INDEPENDENT_AMBULATORY_CARE_PROVIDER_SITE_OTHER): Payer: Medicare Other | Admitting: Cardiology

## 2014-03-28 VITALS — BP 114/57 | HR 46 | Ht 62.0 in | Wt 131.0 lb

## 2014-03-28 DIAGNOSIS — I480 Paroxysmal atrial fibrillation: Secondary | ICD-10-CM

## 2014-03-28 DIAGNOSIS — I6350 Cerebral infarction due to unspecified occlusion or stenosis of unspecified cerebral artery: Secondary | ICD-10-CM

## 2014-03-28 DIAGNOSIS — I4891 Unspecified atrial fibrillation: Secondary | ICD-10-CM

## 2014-03-28 DIAGNOSIS — I639 Cerebral infarction, unspecified: Secondary | ICD-10-CM

## 2014-03-28 DIAGNOSIS — R1013 Epigastric pain: Secondary | ICD-10-CM | POA: Insufficient documentation

## 2014-03-28 DIAGNOSIS — Z5181 Encounter for therapeutic drug level monitoring: Secondary | ICD-10-CM

## 2014-03-28 DIAGNOSIS — Z7901 Long term (current) use of anticoagulants: Secondary | ICD-10-CM | POA: Diagnosis not present

## 2014-03-28 DIAGNOSIS — I119 Hypertensive heart disease without heart failure: Secondary | ICD-10-CM

## 2014-03-28 DIAGNOSIS — F411 Generalized anxiety disorder: Secondary | ICD-10-CM

## 2014-03-28 DIAGNOSIS — E7800 Pure hypercholesterolemia, unspecified: Secondary | ICD-10-CM

## 2014-03-28 DIAGNOSIS — F419 Anxiety disorder, unspecified: Secondary | ICD-10-CM

## 2014-03-28 LAB — POCT INR: INR: 3

## 2014-03-28 MED ORDER — DIAZEPAM 2 MG PO TABS: ORAL_TABLET | ORAL | Status: DC

## 2014-03-28 MED ORDER — ATORVASTATIN CALCIUM 20 MG PO TABS: ORAL_TABLET | ORAL | Status: DC

## 2014-03-28 MED ORDER — AMIODARONE HCL 200 MG PO TABS: ORAL_TABLET | ORAL | Status: DC

## 2014-03-28 NOTE — Assessment & Plan Note (Signed)
The patient remains on long-term warfarin.  She has not had any recurrent atrial fibrillation

## 2014-03-28 NOTE — Progress Notes (Signed)
[name] Date of Birth:  1930/07/29 [facility] [address] [place], [place]  [address] [phone]        Fax   [phone]   History of Present Illness: This pleasant 80 year old woman is seen for a scheduled followup office visit. She was seen on 01/08/12 at which time she was found to be in atrial fibrillation. She herself was not aware of the arrhythmia. She has been on long-term Coumadin for several years after suffering a stroke while vacationing in [place]. At that time the physicians felt that the stroke was cardioembolic although she was in normal sinus rhythm. 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 She also has mild mitral valve prolapse. She had a nuclear stress test May 2011 which showed no regional wall motion abnormalities and no ischemia. She complains of shortness of breath with exertion and easy fatigue. She had had had a dry cough which has resolved after she stopped her ARB. She has not been experiencing any recent chest discomfort to suggest angina. The patient had a chest x-ray on 01/08/12 which showed clear lung fields but mild cardiomegaly and she went on to have a echocardiogram on 01/22/12 which showed mild biatrial enlargement and her ejection fraction was 50-55%.  Current Outpatient Prescriptions  Medication Sig Dispense Refill  . amiodarone (PACERONE) 200 MG tablet 1/2 tablet daily  30 tablet  3  . amLODipine (NORVASC) 5 MG tablet TAKE ONE TABLET BY MOUTH EVERY DAY  90 tablet  3  . amoxicillin (AMOXIL) 500 MG capsule Take 1 capsule (500 mg total) by mouth 3 (three) times daily.  15 capsule  0  . aspirin 81 MG tablet Take 81 mg by mouth daily.        Marland Kitchen atorvastatin (LIPITOR) 20 MG tablet TAKE ONE TABLET BY MOUTH DAILY.  90 tablet  0  . Calcium Carbonate-Vitamin D (CALCIUM 600 + D PO) Take by mouth daily.        . diazepam (VALIUM) 2  MG tablet TAKE ONE TABLET BY MOUTH EVERY DAY AS NEEDED  90 tablet  1  . dimenhyDRINATE (DRAMAMINE) 50 MG tablet Take 50 mg by mouth every 8 (eight) hours as needed.      . furosemide (LASIX) 20 MG tablet TAKE ONE TABLET BY MOUTH EVERY DAY AS NEEDED FOR EDEMA.  30 tablet  0  . nadolol (CORGARD) 20 MG tablet Take 1/2 tablet once a day  90 tablet  0  . nitroGLYCERIN (NITROSTAT) 0.4 MG SL tablet Place 0.4 mg under the tongue every 5 (five) minutes as needed.        . potassium chloride (K-DUR) 10 MEQ tablet Take 1 tablet (10 mEq total) by mouth daily.  90 tablet  1  . warfarin (COUMADIN) 5 MG tablet 1/2 tablet everyday except 1 tablet on Mondays or as directed by coumadin cinic  65 tablet  1   No current facility-administered medications for this visit.    Allergies  Allergen Reactions  . Cozaar   . Hydralazine   . Hyzaar [Losartan Potassium-Hctz]   . Lisinopril   . Losartan Potassium-Hctz   . Sulfa Drugs Cross Reactors     Patient Active Problem List   Diagnosis Date Noted  . Benign hypertensive heart disease without heart failure 03/27/2011    Priority: High  . Hypercholesterolemia 03/27/2011    Priority: High  . Abdominal pain, epigastric  03/28/2014  . Encounter for therapeutic drug monitoring 11/28/2013  . UTI (urinary tract infection) 11/28/2013  . PAF (paroxysmal atrial fibrillation) 12/15/2012  . Atrial fibrillation 01/08/2012  . CVA (cerebral infarction) 01/29/2011    History  Smoking status  . Former Smoker  . Quit date: 10/19/1998  Smokeless tobacco  . Not on file    History  Alcohol Use No    Family History  Problem Relation Age of Onset  . Heart disease Mother   . Hypertension Mother   . Stroke Mother   . Heart disease Father   . Heart failure Brother     Review of Systems: Constitutional: no fever chills diaphoresis or fatigue or change in weight.  Head and neck: no hearing loss, no epistaxis, no photophobia or visual disturbance. Respiratory: No  cough, shortness of breath or wheezing. Cardiovascular: No chest pain peripheral edema, palpitations. Gastrointestinal: No abdominal distention, no abdominal pain, no change in bowel habits hematochezia or melena. Genitourinary: No dysuria, no frequency, no urgency, no nocturia. Musculoskeletal:No arthralgias, no back pain, no gait disturbance or myalgias. Neurological: No dizziness, no headaches, no numbness, no seizures, no syncope, no weakness, no tremors. Hematologic: No lymphadenopathy, no easy bruising. Psychiatric: No confusion, no hallucinations, no sleep disturbance.    Physical Exam: Filed Vitals:   03/28/14 1456  BP: 114/57  Pulse: 46   the general appearance reveals a well-developed well-nourished woman in no distress.The head and neck exam reveals pupils equal and reactive.  Extraocular movements are full.  There is no scleral icterus.  The mouth and pharynx are normal.  The neck is supple.  The carotids reveal no bruits.  The jugular venous pressure is normal.  The  thyroid is not enlarged.  There is no lymphadenopathy.  The chest is clear to percussion and auscultation.  There are no rales or rhonchi.  Expansion of the chest is symmetrical.  The precordium is quiet.  The first heart sound is normal.  The second heart sound is physiologically split.  There is no murmur gallop rub or click.  There is no abnormal lift or heave.  The abdomen is soft and nontender.  The bowel sounds are normal.  The liver and spleen are not enlarged.  There are no abdominal masses.  There are no abdominal bruits.  Extremities reveal good pedal pulses.  There is no phlebitis or edema.  There is no cyanosis or clubbing.  Strength is normal and symmetrical in all extremities.  There is no lateralizing weakness.  There are no sensory deficits.  The skin is warm and dry.  There is no rash.     Assessment / Plan: 1. remote CVA 2. history of paroxysmal atrial fibrillation, maintaining normal sinus rhythm on  amiodarone. 3. benign hypertensive heart disease without heart failure 4. Vertigo 5. hypercholesterolemia  Plan: We're checking a CBC and a basal metabolic panel today.  We will plan to have her return in 4 months for an office visit lipid panel hepatic function panel basal metabolic panel TSH to follow her amiodarone.  After next office visit consider getting a chest x-ray to follow amiodarone.

## 2014-03-28 NOTE — Assessment & Plan Note (Signed)
Patient has a history of hypercholesterolemia and is on Lipitor 20 mg daily.  She has not been having myalgias.. we will plan to check lab work and her next visit.

## 2014-03-28 NOTE — Assessment & Plan Note (Signed)
Today the patient has been complaining of some vague abdominal pain in the upper portion of her abdomen and on the left side of her abdomen.  She's had no fever or chills.  She does not have any known history of diverticulosis.  She states that she has had these episodes in the past and that they usually respond to taking a 2 mg Valium.  We are going to get a CBC and a basal metabolic panel today.

## 2014-03-28 NOTE — Assessment & Plan Note (Signed)
Blood pressure has remained stable on current therapy.  She has been having occasional dizzy spells which have the characteristic of vertigo with transient spinning or worsening of her head.  She will try some over-the-counter Dramamine

## 2014-03-28 NOTE — Patient Instructions (Addendum)
Will obtain labs today and call you with the results (bmet/cbc)  Your physician recommends that you continue on your current medications as directed. Please refer to the Current Medication list given to you today.  Your physician wants you to follow-up in: 4 month ov You will receive a reminder letter in the mail two months in advance. If you don't receive a letter, please call our office to schedule the follow-up appointment.   Ok to try Dramamine for dizziness

## 2014-03-29 LAB — CBC WITH DIFFERENTIAL/PLATELET
Basophils Absolute: 0.1 10*3/uL (ref 0.0–0.1)
Basophils Relative: 1.1 % (ref 0.0–3.0)
Eosinophils Absolute: 0.1 10*3/uL (ref 0.0–0.7)
Eosinophils Relative: 1 % (ref 0.0–5.0)
HCT: 43.2 % (ref 36.0–46.0)
Hemoglobin: 14.4 g/dL (ref 12.0–15.0)
Lymphocytes Relative: 26.6 % (ref 12.0–46.0)
Lymphs Abs: 2.3 10*3/uL (ref 0.7–4.0)
MCHC: 33.4 g/dL (ref 30.0–36.0)
MCV: 95.6 fl (ref 78.0–100.0)
Monocytes Absolute: 0.8 10*3/uL (ref 0.1–1.0)
Monocytes Relative: 8.9 % (ref 3.0–12.0)
Neutro Abs: 5.3 10*3/uL (ref 1.4–7.7)
Neutrophils Relative %: 62.4 % (ref 43.0–77.0)
Platelets: 243 10*3/uL (ref 150.0–400.0)
RBC: 4.51 Mil/uL (ref 3.87–5.11)
RDW: 13.9 % (ref 11.5–15.5)
WBC: 8.5 10*3/uL (ref 4.0–10.5)

## 2014-03-29 LAB — BASIC METABOLIC PANEL
BUN: 18 mg/dL (ref 6–23)
CO2: 30 mEq/L (ref 19–32)
Calcium: 10 mg/dL (ref 8.4–10.5)
Chloride: 100 mEq/L (ref 96–112)
Creatinine, Ser: 2.2 mg/dL — ABNORMAL HIGH (ref 0.4–1.2)
GFR: 22.23 mL/min — ABNORMAL LOW (ref >60.00)
Glucose, Bld: 95 mg/dL (ref 70–99)
Potassium: 4.4 mEq/L (ref 3.5–5.1)
Sodium: 139 mEq/L (ref 135–145)

## 2014-04-02 ENCOUNTER — Other Ambulatory Visit: Payer: Medicare Other

## 2014-04-02 ENCOUNTER — Encounter: Payer: Self-pay | Admitting: Cardiology

## 2014-04-02 ENCOUNTER — Other Ambulatory Visit: Payer: Self-pay | Admitting: Cardiology

## 2014-04-02 DIAGNOSIS — I4891 Unspecified atrial fibrillation: Secondary | ICD-10-CM | POA: Diagnosis not present

## 2014-04-02 DIAGNOSIS — I119 Hypertensive heart disease without heart failure: Secondary | ICD-10-CM | POA: Diagnosis not present

## 2014-04-02 DIAGNOSIS — E7800 Pure hypercholesterolemia, unspecified: Secondary | ICD-10-CM | POA: Diagnosis not present

## 2014-04-06 ENCOUNTER — Other Ambulatory Visit: Payer: Self-pay | Admitting: *Deleted

## 2014-04-06 ENCOUNTER — Telehealth: Payer: Self-pay | Admitting: *Deleted

## 2014-04-06 DIAGNOSIS — E038 Other specified hypothyroidism: Secondary | ICD-10-CM

## 2014-04-06 MED ORDER — LEVOTHYROXINE SODIUM 25 MCG PO TABS: 25.0000 ug | ORAL_TABLET | Freq: Every day | ORAL | Status: DC

## 2014-04-06 NOTE — Telephone Encounter (Signed)
S/w pt is agreeable to plan will come in for repeat labs; tsh,ft4 at coumadin visit and I [facility] ( [facility] [address]

## 2014-04-19 LAB — SPECIMEN STATUS REPORT

## 2014-04-19 LAB — TSH: TSH: 8.31 u[IU]/mL — ABNORMAL HIGH (ref 0.450–4.500)

## 2014-04-23 ENCOUNTER — Telehealth: Payer: Self-pay | Admitting: Cardiology

## 2014-04-23 NOTE — Telephone Encounter (Signed)
Spoke with patient and she did not feel she needed to see  Dr. Patty SermonsBrackbill this week for her swelling in legs, they were better. Scheduled patients recall appointment

## 2014-04-23 NOTE — Telephone Encounter (Signed)
Left message to call back  

## 2014-04-23 NOTE — Telephone Encounter (Signed)
New message     Patient calling back to speak with Juliette AlcideMelinda from Friday.

## 2014-04-25 ENCOUNTER — Other Ambulatory Visit: Payer: Medicare Other

## 2014-04-26 ENCOUNTER — Ambulatory Visit (INDEPENDENT_AMBULATORY_CARE_PROVIDER_SITE_OTHER): Payer: Medicare Other | Admitting: *Deleted

## 2014-04-26 ENCOUNTER — Ambulatory Visit: Payer: Medicare Other | Admitting: Cardiology

## 2014-04-26 DIAGNOSIS — Z7901 Long term (current) use of anticoagulants: Secondary | ICD-10-CM

## 2014-04-26 DIAGNOSIS — I4891 Unspecified atrial fibrillation: Secondary | ICD-10-CM | POA: Diagnosis not present

## 2014-04-26 DIAGNOSIS — I639 Cerebral infarction, unspecified: Secondary | ICD-10-CM

## 2014-04-26 DIAGNOSIS — Z5181 Encounter for therapeutic drug level monitoring: Secondary | ICD-10-CM | POA: Diagnosis not present

## 2014-04-26 DIAGNOSIS — I6350 Cerebral infarction due to unspecified occlusion or stenosis of unspecified cerebral artery: Secondary | ICD-10-CM | POA: Diagnosis not present

## 2014-04-26 LAB — POCT INR: INR: 3.8

## 2014-05-02 DIAGNOSIS — T6391XA Toxic effect of contact with unspecified venomous animal, accidental (unintentional), initial encounter: Secondary | ICD-10-CM | POA: Diagnosis not present

## 2014-05-09 ENCOUNTER — Ambulatory Visit (INDEPENDENT_AMBULATORY_CARE_PROVIDER_SITE_OTHER): Payer: Medicare Other | Admitting: *Deleted

## 2014-05-09 DIAGNOSIS — Z5181 Encounter for therapeutic drug level monitoring: Secondary | ICD-10-CM | POA: Diagnosis not present

## 2014-05-09 DIAGNOSIS — I4891 Unspecified atrial fibrillation: Secondary | ICD-10-CM

## 2014-05-09 DIAGNOSIS — I6350 Cerebral infarction due to unspecified occlusion or stenosis of unspecified cerebral artery: Secondary | ICD-10-CM

## 2014-05-09 DIAGNOSIS — Z7901 Long term (current) use of anticoagulants: Secondary | ICD-10-CM

## 2014-05-09 DIAGNOSIS — I639 Cerebral infarction, unspecified: Secondary | ICD-10-CM

## 2014-05-09 LAB — POCT INR: INR: 4.7

## 2014-05-23 DIAGNOSIS — I6350 Cerebral infarction due to unspecified occlusion or stenosis of unspecified cerebral artery: Secondary | ICD-10-CM | POA: Diagnosis not present

## 2014-05-23 LAB — POCT INR: INR: 2.6

## 2014-05-25 ENCOUNTER — Ambulatory Visit (INDEPENDENT_AMBULATORY_CARE_PROVIDER_SITE_OTHER): Payer: Medicare Other | Admitting: Pharmacist

## 2014-05-25 DIAGNOSIS — Z5181 Encounter for therapeutic drug level monitoring: Secondary | ICD-10-CM

## 2014-05-29 ENCOUNTER — Other Ambulatory Visit: Payer: Self-pay | Admitting: Cardiology

## 2014-06-08 DIAGNOSIS — I6350 Cerebral infarction due to unspecified occlusion or stenosis of unspecified cerebral artery: Secondary | ICD-10-CM | POA: Diagnosis not present

## 2014-06-08 LAB — PROTIME-INR: INR: 2.1 — AB (ref <=1.1)

## 2014-06-13 ENCOUNTER — Ambulatory Visit (INDEPENDENT_AMBULATORY_CARE_PROVIDER_SITE_OTHER): Payer: Medicare Other | Admitting: Pharmacist

## 2014-06-13 DIAGNOSIS — Z5181 Encounter for therapeutic drug level monitoring: Secondary | ICD-10-CM

## 2014-06-28 ENCOUNTER — Ambulatory Visit (INDEPENDENT_AMBULATORY_CARE_PROVIDER_SITE_OTHER): Payer: Medicare Other

## 2014-06-28 DIAGNOSIS — I6350 Cerebral infarction due to unspecified occlusion or stenosis of unspecified cerebral artery: Secondary | ICD-10-CM

## 2014-06-28 DIAGNOSIS — S93609A Unspecified sprain of unspecified foot, initial encounter: Secondary | ICD-10-CM | POA: Diagnosis not present

## 2014-06-28 DIAGNOSIS — Z5181 Encounter for therapeutic drug level monitoring: Secondary | ICD-10-CM | POA: Diagnosis not present

## 2014-06-28 DIAGNOSIS — Z7901 Long term (current) use of anticoagulants: Secondary | ICD-10-CM | POA: Diagnosis not present

## 2014-06-28 DIAGNOSIS — I4891 Unspecified atrial fibrillation: Secondary | ICD-10-CM | POA: Diagnosis not present

## 2014-06-28 DIAGNOSIS — I639 Cerebral infarction, unspecified: Secondary | ICD-10-CM

## 2014-06-28 LAB — POCT INR: INR: 2

## 2014-06-29 DIAGNOSIS — L02619 Cutaneous abscess of unspecified foot: Secondary | ICD-10-CM | POA: Diagnosis not present

## 2014-06-29 DIAGNOSIS — L03039 Cellulitis of unspecified toe: Secondary | ICD-10-CM | POA: Diagnosis not present

## 2014-07-01 ENCOUNTER — Other Ambulatory Visit: Payer: Self-pay | Admitting: Cardiology

## 2014-07-02 NOTE — Telephone Encounter (Signed)
Is the patient still to be taking this? Please advise. Thanks, MI 

## 2014-07-03 DIAGNOSIS — L02619 Cutaneous abscess of unspecified foot: Secondary | ICD-10-CM | POA: Diagnosis not present

## 2014-07-06 DIAGNOSIS — L02619 Cutaneous abscess of unspecified foot: Secondary | ICD-10-CM | POA: Diagnosis not present

## 2014-07-12 ENCOUNTER — Encounter: Payer: Self-pay | Admitting: Cardiology

## 2014-07-12 ENCOUNTER — Ambulatory Visit (INDEPENDENT_AMBULATORY_CARE_PROVIDER_SITE_OTHER): Payer: Medicare Other | Admitting: Cardiology

## 2014-07-12 VITALS — BP 124/64 | HR 41 | Ht 62.0 in | Wt 135.1 lb

## 2014-07-12 DIAGNOSIS — R001 Bradycardia, unspecified: Secondary | ICD-10-CM | POA: Insufficient documentation

## 2014-07-12 DIAGNOSIS — I6350 Cerebral infarction due to unspecified occlusion or stenosis of unspecified cerebral artery: Secondary | ICD-10-CM | POA: Diagnosis not present

## 2014-07-12 DIAGNOSIS — I4891 Unspecified atrial fibrillation: Secondary | ICD-10-CM

## 2014-07-12 DIAGNOSIS — R350 Frequency of micturition: Secondary | ICD-10-CM

## 2014-07-12 DIAGNOSIS — E7800 Pure hypercholesterolemia, unspecified: Secondary | ICD-10-CM

## 2014-07-12 DIAGNOSIS — I498 Other specified cardiac arrhythmias: Secondary | ICD-10-CM

## 2014-07-12 DIAGNOSIS — I119 Hypertensive heart disease without heart failure: Secondary | ICD-10-CM

## 2014-07-12 LAB — CBC WITH DIFFERENTIAL/PLATELET
Basophils Absolute: 0 10*3/uL (ref 0.0–0.1)
Basophils Relative: 0.4 % (ref 0.0–3.0)
Eosinophils Absolute: 0.3 10*3/uL (ref 0.0–0.7)
Eosinophils Relative: 3.2 % (ref 0.0–5.0)
HCT: 43 % (ref 36.0–46.0)
Hemoglobin: 14 g/dL (ref 12.0–15.0)
Lymphocytes Relative: 26.8 % (ref 12.0–46.0)
Lymphs Abs: 2.5 10*3/uL (ref 0.7–4.0)
MCHC: 32.6 g/dL (ref 30.0–36.0)
MCV: 95.9 fl (ref 78.0–100.0)
Monocytes Absolute: 0.8 10*3/uL (ref 0.1–1.0)
Monocytes Relative: 8.4 % (ref 3.0–12.0)
Neutro Abs: 5.7 10*3/uL (ref 1.4–7.7)
Neutrophils Relative %: 61.2 % (ref 43.0–77.0)
Platelets: 266 10*3/uL (ref 150.0–400.0)
RBC: 4.48 Mil/uL (ref 3.87–5.11)
RDW: 13.6 % (ref 11.5–15.5)
WBC: 9.3 10*3/uL (ref 4.0–10.5)

## 2014-07-12 LAB — BASIC METABOLIC PANEL
BUN: 20 mg/dL (ref 6–23)
CO2: 32 mEq/L (ref 19–32)
Calcium: 9.2 mg/dL (ref 8.4–10.5)
Chloride: 98 mEq/L (ref 96–112)
Creatinine, Ser: 1.4 mg/dL — ABNORMAL HIGH (ref 0.4–1.2)
GFR: 36.99 mL/min — ABNORMAL LOW (ref >60.00)
Glucose, Bld: 103 mg/dL — ABNORMAL HIGH (ref 70–99)
Potassium: 3.4 mEq/L — ABNORMAL LOW (ref 3.5–5.1)
Sodium: 137 mEq/L (ref 135–145)

## 2014-07-12 LAB — TSH: TSH: 1.05 u[IU]/mL (ref 0.35–4.50)

## 2014-07-12 NOTE — Progress Notes (Signed)
[name] Date of Birth:  30-Aug-1930 [facility] [address] [place], [place]  [address] [phone]        Fax   [phone]   History of Present Illness: This pleasant 80 year old woman is seen for a scheduled followup office visit. She was seen on 01/08/12 at which time she was found to be in atrial fibrillation. She herself was not aware of the arrhythmia. She had already been on long-term Coumadin for several years after suffering a stroke while vacationing in [place]. At that time the physicians felt that the stroke was cardioembolic although she was in normal sinus rhythm. 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 She also has mild mitral valve prolapse. She had a nuclear stress test May 2011 which showed no regional wall motion abnormalities and no ischemia. She complains of shortness of breath with exertion and easy fatigue. She had had had a dry cough which has resolved after she stopped her ARB. She has not been experiencing any recent chest discomfort to suggest angina. The patient had a chest x-ray on 01/08/12 which showed clear lung fields but mild cardiomegaly and she went on to have a echocardiogram on 01/22/12 which showed mild biatrial enlargement and her ejection fraction was 50-55%. Since last visit she's been having problems with feeling lightheaded.  She also suffered some trauma to her left foot when she slipped climbing into her motorhome and injured the left foot.  She developed a hematoma which Dr. Farris Has subsequently aspirated and placed her on antibiotic. The patient has been having some urinary frequency. She has had increased dyspnea on exertion and we gave her a handicap application for parking space.  Current Outpatient Prescriptions  Medication Sig Dispense Refill  . amLODipine (NORVASC) 5 MG tablet TAKE ONE TABLET BY MOUTH ONCE DAILY.   90 tablet  0  . atorvastatin (LIPITOR) 20 MG tablet Take 20 mg by mouth every morning. TAKING 1/2  tablet TABLET BY MOUTH DAILY.      Marland Kitchen B-Complex-C-Biotin-Minerals-FA (FOLBEE PLUS CZ) 5 MG TABS TAKE 1 TABLET BY MOUTH EVERY DAY  30 tablet  5  . Calcium Carbonate-Vitamin D (CALCIUM 600 + D PO) Take by mouth daily.        . diazepam (VALIUM) 2 MG tablet TAKE ONE TABLET BY MOUTH EVERY DAY AS NEEDED  90 tablet  1  . dimenhyDRINATE (DRAMAMINE) 50 MG tablet Take 50 mg by mouth every 8 (eight) hours as needed.      . furosemide (LASIX) 20 MG tablet TAKE ONE TABLET BY MOUTH EVERY DAY AS NEEDED FOR EDEMA.  30 tablet  0  . nadolol (CORGARD) 20 MG tablet Take 1/2 tablet EVERY OTHER DAY      . nitroGLYCERIN (NITROSTAT) 0.4 MG SL tablet Place 0.4 mg under the tongue every 5 (five) minutes as needed.        . warfarin (COUMADIN) 5 MG tablet 1/2 tablet everyday except 1 tablet on Mondays or as directed by coumadin cinic  65 tablet  1   No current facility-administered medications for this visit.    Allergies  Allergen Reactions  . Cozaar   . Hydralazine   . Hyzaar [Losartan Potassium-Hctz]   . Lisinopril   . Losartan Potassium-Hctz   . Sulfa Drugs Cross Reactors     Patient Active Problem List   Diagnosis Date Noted  . Benign hypertensive heart disease without heart  failure 03/27/2011    Priority: High  . Hypercholesterolemia 03/27/2011    Priority: High  . Symptomatic sinus bradycardia 07/12/2014  . Abdominal pain, epigastric 03/28/2014  . Encounter for therapeutic drug monitoring 11/28/2013  . UTI (urinary tract infection) 11/28/2013  . PAF (paroxysmal atrial fibrillation) 12/15/2012  . Atrial fibrillation 01/08/2012  . CVA (cerebral infarction) 01/29/2011    History  Smoking status  . Former Smoker  . Quit date: 10/19/1998  Smokeless tobacco  . Not on file    History  Alcohol Use No    Family History  Problem Relation Age of Onset  . Heart disease Mother   . Hypertension  Mother   . Stroke Mother   . Heart disease Father   . Heart failure Brother     Review of Systems: Constitutional: no fever chills diaphoresis or fatigue or change in weight.  Head and neck: no hearing loss, no epistaxis, no photophobia or visual disturbance. Respiratory: No cough, shortness of breath or wheezing. Cardiovascular: No chest pain peripheral edema, palpitations. Gastrointestinal: No abdominal distention, no abdominal pain, no change in bowel habits hematochezia or melena. Genitourinary: No dysuria, no frequency, no urgency, no nocturia. Musculoskeletal:No arthralgias, no back pain, no gait disturbance or myalgias. Neurological: No dizziness, no headaches, no numbness, no seizures, no syncope, no weakness, no tremors. Hematologic: No lymphadenopathy, no easy bruising. Psychiatric: No confusion, no hallucinations, no sleep disturbance.    Physical Exam: Filed Vitals:   07/12/14 1411  BP: 124/64  Pulse: 41   the general appearance reveals a well-developed well-nourished woman in no distress.The head and neck exam reveals pupils equal and reactive.  Extraocular movements are full.  There is no scleral icterus.  The mouth and pharynx are normal.  The neck is supple.  The carotids reveal no bruits.  The jugular venous pressure is normal.  The  thyroid is not enlarged.  There is no lymphadenopathy.  The chest is clear to percussion and auscultation.  There are no rales or rhonchi.  Expansion of the chest is symmetrical.  The precordium is quiet.  The first heart sound is normal.  The second heart sound is physiologically split.  There is no murmur gallop rub or click.  There is no abnormal lift or heave.  The abdomen is soft and nontender.  The bowel sounds are normal.  The liver and spleen are not enlarged.  There are no abdominal masses.  There are no abdominal bruits.  Extremities reveal good pedal pulses.  There is a subacute hematoma on the left forefoot.  There is no phlebitis or  edema.  There is no cyanosis or clubbing.  Strength is normal and symmetrical in all extremities.  There is no lateralizing weakness.  There are no sensory deficits.  The skin is warm and dry.  There is no rash.  EKG shows marked sinus bradycardia with first-degree AV block and left anterior fascicular block and since previous tracing of 11/28/13, heart rate is slower   Assessment / Plan: 1. remote CVA 2. history of paroxysmal atrial fibrillation, now with marked sinus bradycardia 3. benign hypertensive heart disease without heart failure 4. Vertigo 5. hypercholesterolemia  Plan: Stop amiodarone.  Reduce nadolol to every other day.  Stop the aspirin since she has no coronary disease and she is on warfarin. Check urinalysis CBC TSH and basal metabolic panel today Recheck in 2 months for office visit and EKG.

## 2014-07-12 NOTE — Patient Instructions (Signed)
Will obtain labs today and call you with the results (CBC/TSH/BMET/URINALYSIS)  STOP ASPIRIN  STOP AMIODARONE  DECREASE NADOLOL TO 1/2 TABLET EVERY OTHER DAY  Your physician recommends that you schedule a follow-up appointment in: 2 MONTH OV/EKG

## 2014-07-12 NOTE — Assessment & Plan Note (Signed)
She has not been aware of any racing of her heart.  EKG today shows marked sinus bradycardia at 41 per minute.  We will stop her amiodarone because of her bradycardia which may be contributing to her lightheadedness and weakness.

## 2014-07-12 NOTE — Assessment & Plan Note (Signed)
The patient has not been experiencing any symptoms of congestive heart failure 

## 2014-07-12 NOTE — Assessment & Plan Note (Signed)
The patient has had recurrent symptoms of right flank pain and urinary frequency and urgency.  We will get a urinalysis today.  We will also recheck CBC TSH and basal metabolic panel

## 2014-07-12 NOTE — Assessment & Plan Note (Signed)
The patient remains on long-term warfarin for prophylaxis against systemic emboli.  Because of her marked bradycardia we will reduce her nadolol to just one half tablet every other day and we will stop her amiodarone.

## 2014-07-13 ENCOUNTER — Ambulatory Visit: Payer: Medicare Other

## 2014-07-13 DIAGNOSIS — R350 Frequency of micturition: Secondary | ICD-10-CM

## 2014-07-13 LAB — URINALYSIS
Bilirubin Urine: NEGATIVE
Hgb urine dipstick: NEGATIVE
Ketones, ur: NEGATIVE
Leukocytes, UA: NEGATIVE
Nitrite: NEGATIVE
Specific Gravity, Urine: 1.01 (ref 1.000–1.030)
Urine Glucose: NEGATIVE
Urobilinogen, UA: 0.2 (ref 0.0–1.0)
pH: 6.5 (ref 5.0–8.0)

## 2014-07-13 NOTE — Progress Notes (Signed)
Quick Note:  Please report to patient. The recent labs are stable. Continue same medication and careful diet. Potassium is low. Add Kdur 10 meq daily. ______

## 2014-07-16 ENCOUNTER — Telehealth: Payer: Self-pay | Admitting: *Deleted

## 2014-07-16 DIAGNOSIS — E039 Hypothyroidism, unspecified: Secondary | ICD-10-CM

## 2014-07-16 MED ORDER — POTASSIUM CHLORIDE ER 10 MEQ PO TBCR: 10.0000 meq | EXTENDED_RELEASE_TABLET | Freq: Every day | ORAL | Status: DC

## 2014-07-16 NOTE — Telephone Encounter (Signed)
Message copied by Burnell Blanks on Mon Jul 16, 2014  5:50 PM ------      Message from: Cassell Clement      Created: Fri Jul 13, 2014  8:00 PM       The urinalysis is normal. No sign of UTI ------

## 2014-07-16 NOTE — Telephone Encounter (Signed)
Message copied by Burnell Blanks on Mon Jul 16, 2014  5:50 PM ------      Message from: Cassell Clement      Created: Fri Jul 13, 2014  6:39 AM       Please report to patient.  The recent labs are stable. Continue same medication and careful diet. Potassium is low.  Add Kdur 10 meq daily. ------

## 2014-07-16 NOTE — Telephone Encounter (Signed)
Advised patient of lab results and medication changes  °

## 2014-07-19 ENCOUNTER — Ambulatory Visit (INDEPENDENT_AMBULATORY_CARE_PROVIDER_SITE_OTHER): Payer: Medicare Other | Admitting: *Deleted

## 2014-07-19 DIAGNOSIS — I4891 Unspecified atrial fibrillation: Secondary | ICD-10-CM

## 2014-07-19 DIAGNOSIS — Z7901 Long term (current) use of anticoagulants: Secondary | ICD-10-CM

## 2014-07-19 DIAGNOSIS — I639 Cerebral infarction, unspecified: Secondary | ICD-10-CM

## 2014-07-19 DIAGNOSIS — Z5181 Encounter for therapeutic drug level monitoring: Secondary | ICD-10-CM | POA: Diagnosis not present

## 2014-07-19 LAB — POCT INR: INR: 1.7

## 2014-08-02 ENCOUNTER — Ambulatory Visit (INDEPENDENT_AMBULATORY_CARE_PROVIDER_SITE_OTHER): Payer: Medicare Other | Admitting: *Deleted

## 2014-08-02 DIAGNOSIS — I639 Cerebral infarction, unspecified: Secondary | ICD-10-CM | POA: Diagnosis not present

## 2014-08-02 DIAGNOSIS — Z5181 Encounter for therapeutic drug level monitoring: Secondary | ICD-10-CM | POA: Diagnosis not present

## 2014-08-02 DIAGNOSIS — I4891 Unspecified atrial fibrillation: Secondary | ICD-10-CM | POA: Diagnosis not present

## 2014-08-02 DIAGNOSIS — Z7901 Long term (current) use of anticoagulants: Secondary | ICD-10-CM

## 2014-08-02 LAB — POCT INR: INR: 1.8

## 2014-08-15 ENCOUNTER — Other Ambulatory Visit: Payer: Self-pay | Admitting: Cardiology

## 2014-08-16 ENCOUNTER — Ambulatory Visit (INDEPENDENT_AMBULATORY_CARE_PROVIDER_SITE_OTHER): Payer: Medicare Other

## 2014-08-16 DIAGNOSIS — Z5181 Encounter for therapeutic drug level monitoring: Secondary | ICD-10-CM

## 2014-08-16 DIAGNOSIS — I4891 Unspecified atrial fibrillation: Secondary | ICD-10-CM

## 2014-08-16 DIAGNOSIS — I639 Cerebral infarction, unspecified: Secondary | ICD-10-CM | POA: Diagnosis not present

## 2014-08-16 DIAGNOSIS — Z7901 Long term (current) use of anticoagulants: Secondary | ICD-10-CM | POA: Diagnosis not present

## 2014-08-16 LAB — POCT INR: INR: 2.2

## 2014-08-17 ENCOUNTER — Telehealth: Payer: Self-pay

## 2014-08-17 NOTE — Telephone Encounter (Signed)
A half tablet every other day.

## 2014-09-04 ENCOUNTER — Other Ambulatory Visit: Payer: Self-pay | Admitting: Cardiology

## 2014-09-11 ENCOUNTER — Ambulatory Visit (INDEPENDENT_AMBULATORY_CARE_PROVIDER_SITE_OTHER): Payer: Medicare Other | Admitting: *Deleted

## 2014-09-11 ENCOUNTER — Ambulatory Visit (INDEPENDENT_AMBULATORY_CARE_PROVIDER_SITE_OTHER): Payer: Medicare Other | Admitting: Cardiology

## 2014-09-11 ENCOUNTER — Encounter: Payer: Self-pay | Admitting: *Deleted

## 2014-09-11 VITALS — BP 134/60 | HR 48 | Ht 62.0 in | Wt 135.0 lb

## 2014-09-11 DIAGNOSIS — I639 Cerebral infarction, unspecified: Secondary | ICD-10-CM

## 2014-09-11 DIAGNOSIS — Z5181 Encounter for therapeutic drug level monitoring: Secondary | ICD-10-CM

## 2014-09-11 DIAGNOSIS — I119 Hypertensive heart disease without heart failure: Secondary | ICD-10-CM | POA: Diagnosis not present

## 2014-09-11 DIAGNOSIS — I480 Paroxysmal atrial fibrillation: Secondary | ICD-10-CM

## 2014-09-11 DIAGNOSIS — I4891 Unspecified atrial fibrillation: Secondary | ICD-10-CM

## 2014-09-11 DIAGNOSIS — E039 Hypothyroidism, unspecified: Secondary | ICD-10-CM | POA: Diagnosis not present

## 2014-09-11 DIAGNOSIS — Z7901 Long term (current) use of anticoagulants: Secondary | ICD-10-CM | POA: Diagnosis not present

## 2014-09-11 DIAGNOSIS — R351 Nocturia: Secondary | ICD-10-CM | POA: Insufficient documentation

## 2014-09-11 LAB — POCT INR: INR: 2.1

## 2014-09-11 NOTE — Assessment & Plan Note (Signed)
The patient has a history of paroxysmal atrial fibrillation.  She is on long-term Coumadin.  She has not had any TIA symptoms.

## 2014-09-11 NOTE — Assessment & Plan Note (Signed)
The patient has a history of hypothyroidism and is on low dose Synthroid 25 g daily. Recheck her thyroid function at her next visit

## 2014-09-11 NOTE — Assessment & Plan Note (Signed)
The patient has had chronic nocturia 5.  She is also been experiencing some chronic right flank pain.  She has a past history of urinary tract infections. She is not presently having any dysuria.  We will refer her to urology

## 2014-09-11 NOTE — Assessment & Plan Note (Signed)
The patient has not been expressing any symptoms of CHF.

## 2014-09-11 NOTE — Progress Notes (Signed)
[name] Date of Birth:  [birth year] [facility] [address] [place], [place]  [address] [phone]        Fax   [phone]   History of Present Illness: This pleasant 80 year old woman is seen for a scheduled followup office visit. She was seen on 01/08/12 at which time she was found to be in atrial fibrillation. She herself was not aware of the arrhythmia. She had already been on long-term Coumadin for several years after suffering a stroke while vacation[place] in [place]. At that time the physicians felt that the stroke was cardioembolic although she was in normal sinus rhythm. 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 She also has mild mitral valve prolapse. She had a nuclear stress test May 2011 which showed no regional wall motion abnormalities and no ischemia. She complains of shortness of breath with exertion and easy fatigue. She had had had a dry cough which has resolved after she stopped her ARB. She has not been experiencing any recent chest discomfort to suggest angina. The patient had a chest x-ray on 01/08/12 which showed clear lung fields but mild cardiomegaly and she went on to have a echocardiogram on 01/22/12 which showed mild biatrial enlargement and her ejection fraction was 50-55%.  since last visit she has continued to complain of some intermittent right flank pain. She has chronic nocturia 5.  She has not yet seen a urologist.  Current Outpatient Prescriptions  Medication Sig Dispense Refill  . amLODipine (NORVASC) 5 MG tablet TAKE ONE TABLET BY MOUTH ONCE DAILY. 90 tablet 0  . atorvastatin (LIPITOR) 20 MG tablet Take 20 mg by mouth every morning. TAKING 1/2  tablet TABLET BY MOUTH DAILY.    Marland Kitchen. B-Complex-C-Biotin-Minerals-FA (FOLBEE PLUS CZ) 5 MG TABS TAKE 1 TABLET BY MOUTH EVERY DAY 30 tablet 5  . Calcium Carbonate-Vitamin D (CALCIUM 600 + D  PO) Take by mouth daily.      . diazepam (VALIUM) 2 MG tablet TAKE ONE TABLET BY MOUTH EVERY DAY AS NEEDED 90 tablet 1  . dimenhyDRINATE (DRAMAMINE) 50 MG tablet Take 50 mg by mouth every 8 (eight) hours as needed.    . furosemide (LASIX) 20 MG tablet TAKE ONE TABLET BY MOUTH EVERY DAY AS NEEDED FOR EDEMA. 30 tablet 0  . levothyroxine (SYNTHROID, LEVOTHROID) 25 MCG tablet Take 25 mcg by mouth daily before breakfast.    . nadolol (CORGARD) 20 MG tablet TAKE ONE-HALF TABLET BY MOUTH ONCE DAILY 90 tablet 0  . nitroGLYCERIN (NITROSTAT) 0.4 MG SL tablet Place 0.4 mg under the tongue every 5 (five) minutes as needed.      . potassium chloride (K-DUR) 10 MEQ tablet Take 1 tablet (10 mEq total) by mouth daily. 90 tablet 3  . warfarin (COUMADIN) 5 MG tablet 1/2 tablet everyday except 1 tablet on Mondays or as directed by coumadin cinic (Patient taking differently: 1/2 tablet everyday except 1 tablet on Sunday and Thursday or as directed by coumadin cinic) 65 tablet 1   No current facility-administered medications for this visit.    Allergies  Allergen Reactions  . Cozaar   . Hydralazine   . Hyzaar [Losartan Potassium-Hctz]   . Lisinopril   . Losartan Potassium-Hctz   . Sulfa Drugs Cross Reactors     Patient Active Problem List   Diagnosis Date Noted  . Benign hypertensive heart disease without heart failure 03/27/2011  Priority: High  . Hypercholesterolemia 03/27/2011    Priority: High  . Nocturia more than twice per night 09/11/2014  . Hypothyroidism 09/11/2014  . Symptomatic sinus bradycardia 07/12/2014  . Abdominal pain, epigastric 03/28/2014  . Encounter for therapeutic drug monitoring 11/28/2013  . UTI (urinary tract infection) 11/28/2013  . PAF (paroxysmal atrial fibrillation) 12/15/2012  . Atrial fibrillation 01/08/2012  . CVA (cerebral infarction) 01/29/2011    History  Smoking status  . Former Smoker  . Quit date: 10/19/1998  Smokeless tobacco  . Not on file     History  Alcohol Use No    Family History  Problem Relation Age of Onset  . Heart disease Mother   . Hypertension Mother   . Stroke Mother   . Heart disease Father   . Heart failure Brother     Review of Systems: Constitutional: no fever chills diaphoresis or fatigue or change in weight.  Head and neck: no hearing loss, no epistaxis, no photophobia or visual disturbance. Respiratory: No cough, shortness of breath or wheezing. Cardiovascular: No chest pain peripheral edema, palpitations. Gastrointestinal: No abdominal distention, no abdominal pain, no change in bowel habits hematochezia or melena. Genitourinary: No dysuria, no frequency, no urgency, no nocturia. Musculoskeletal:No arthralgias, no back pain, no gait disturbance or myalgias. Neurological: No dizziness, no headaches, no numbness, no seizures, no syncope, no weakness, no tremors. Hematologic: No lymphadenopathy, no easy bruising. Psychiatric: No confusion, no hallucinations, no sleep disturbance.    Physical Exam: Filed Vitals:   09/11/14 1445  BP: 134/60  Pulse: 48   the general appearance reveals a well-developed well-nourished woman in no distress.The head and neck exam reveals pupils equal and reactive.  Extraocular movements are full.  There is no scleral icterus.  The mouth and pharynx are normal.  The neck is supple.  The carotids reveal no bruits.  The jugular venous pressure is normal.  The  thyroid is not enlarged.  There is no lymphadenopathy.  The chest is clear to percussion and auscultation.  There are no rales or rhonchi.  Expansion of the chest is symmetrical.  The precordium is quiet.  The first heart sound is normal.  The second heart sound is physiologically split.  There is no murmur gallop rub or click.  There is no abnormal lift or heave.  The abdomen is soft and nontender.  The bowel sounds are normal.  The liver and spleen are not enlarged.  There are no abdominal masses.  There are no  abdominal bruits.  Extremities reveal good pedal pulses.  There is a subacute hematoma on the left forefoot.  There is no phlebitis or edema.  There is no cyanosis or clubbing.  Strength is normal and symmetrical in all extremities.  There is no lateralizing weakness.  There are no sensory deficits.  The skin is warm and dry.  There is no rash.     Assessment / Plan: 1. remote CVA 2. history of paroxysmal atrial fibrillation. 3. benign hypertensive heart disease without heart failure 4. Vertigo 5. hypercholesterolemia 6.  Nocturia 5 and right flank pain.  Plan:  Continue current medications.  Recheck in 4 months for office visit EKG basal metabolic panel TSH and free T4.  referral to urology Re: Right flank pain and nocturia

## 2014-09-11 NOTE — Patient Instructions (Signed)
Your physician recommends that you continue on your current medications as directed. Please refer to the Current Medication list given to you today.  Your physician recommends that you schedule a follow-up appointment in: 4 MONTH OV/BMET/TSH/FT4/EKG   [facility] October 24, 2014 AT 1:00 PM, OFFICE NUMBER [phone]

## 2014-09-11 NOTE — Telephone Encounter (Signed)
This encounter was created in error - please disregard.

## 2014-09-11 NOTE — Telephone Encounter (Signed)
Patient aware.

## 2014-09-20 ENCOUNTER — Telehealth: Payer: Self-pay | Admitting: *Deleted

## 2014-09-20 NOTE — Telephone Encounter (Signed)
New Message  Left vm for pt concerning flu shot 

## 2014-09-24 DIAGNOSIS — H04123 Dry eye syndrome of bilateral lacrimal glands: Secondary | ICD-10-CM | POA: Diagnosis not present

## 2014-09-24 DIAGNOSIS — H40003 Preglaucoma, unspecified, bilateral: Secondary | ICD-10-CM | POA: Diagnosis not present

## 2014-09-24 DIAGNOSIS — H16103 Unspecified superficial keratitis, bilateral: Secondary | ICD-10-CM | POA: Diagnosis not present

## 2014-09-24 DIAGNOSIS — H2513 Age-related nuclear cataract, bilateral: Secondary | ICD-10-CM | POA: Diagnosis not present

## 2014-10-10 ENCOUNTER — Ambulatory Visit (INDEPENDENT_AMBULATORY_CARE_PROVIDER_SITE_OTHER): Payer: Medicare Other | Admitting: *Deleted

## 2014-10-10 DIAGNOSIS — Z5181 Encounter for therapeutic drug level monitoring: Secondary | ICD-10-CM | POA: Diagnosis not present

## 2014-10-10 DIAGNOSIS — I639 Cerebral infarction, unspecified: Secondary | ICD-10-CM | POA: Diagnosis not present

## 2014-10-10 DIAGNOSIS — Z7901 Long term (current) use of anticoagulants: Secondary | ICD-10-CM

## 2014-10-10 DIAGNOSIS — I4891 Unspecified atrial fibrillation: Secondary | ICD-10-CM

## 2014-10-10 LAB — POCT INR: INR: 2.4

## 2014-10-15 ENCOUNTER — Telehealth: Payer: Self-pay | Admitting: *Deleted

## 2014-10-15 NOTE — Telephone Encounter (Signed)
Telephoned pt and informed her of Dr Jenness CornerBrackbills recommendation that she doesn't need to hold coumadin for cataract removal.

## 2014-10-15 NOTE — Telephone Encounter (Signed)
-----   Message from Cassell Clementhomas Brackbill, MD sent at 10/14/2014  8:26 AM EST ----- Regarding: RE: Pending catarct removal- no date set Please let her know that we do not need to hold warfarin for cataract surgery. TB ----- Message -----    From: Raul Deliffany J Muse, RN    Sent: 10/10/2014   2:15 PM      To: Cassell Clementhomas Brackbill, MD Subject: Pending catarct removal- no date set           Pt inquiring about how many days will she need to hold before cataract removal. I informed her that we usually don't hold prior to but I would send you this note for your suggestion. Thank you.

## 2014-10-24 DIAGNOSIS — R351 Nocturia: Secondary | ICD-10-CM | POA: Diagnosis not present

## 2014-10-24 DIAGNOSIS — M549 Dorsalgia, unspecified: Secondary | ICD-10-CM | POA: Diagnosis not present

## 2014-10-24 DIAGNOSIS — N390 Urinary tract infection, site not specified: Secondary | ICD-10-CM | POA: Diagnosis not present

## 2014-10-24 DIAGNOSIS — R3915 Urgency of urination: Secondary | ICD-10-CM | POA: Diagnosis not present

## 2014-10-24 DIAGNOSIS — G8929 Other chronic pain: Secondary | ICD-10-CM | POA: Diagnosis not present

## 2014-11-05 ENCOUNTER — Other Ambulatory Visit: Payer: Self-pay | Admitting: *Deleted

## 2014-11-05 MED ORDER — WARFARIN SODIUM 5 MG PO TABS: ORAL_TABLET | ORAL | Status: DC

## 2014-11-12 DIAGNOSIS — M549 Dorsalgia, unspecified: Secondary | ICD-10-CM | POA: Diagnosis not present

## 2014-11-12 DIAGNOSIS — R351 Nocturia: Secondary | ICD-10-CM | POA: Diagnosis not present

## 2014-11-12 DIAGNOSIS — R3915 Urgency of urination: Secondary | ICD-10-CM | POA: Diagnosis not present

## 2014-11-21 ENCOUNTER — Ambulatory Visit (INDEPENDENT_AMBULATORY_CARE_PROVIDER_SITE_OTHER): Payer: Medicare Other | Admitting: *Deleted

## 2014-11-21 DIAGNOSIS — I6310 Cerebral infarction due to embolism of unspecified precerebral artery: Secondary | ICD-10-CM

## 2014-11-21 DIAGNOSIS — I639 Cerebral infarction, unspecified: Secondary | ICD-10-CM

## 2014-11-21 DIAGNOSIS — Z5181 Encounter for therapeutic drug level monitoring: Secondary | ICD-10-CM

## 2014-11-21 DIAGNOSIS — Z7901 Long term (current) use of anticoagulants: Secondary | ICD-10-CM | POA: Diagnosis not present

## 2014-11-21 DIAGNOSIS — I4891 Unspecified atrial fibrillation: Secondary | ICD-10-CM | POA: Diagnosis not present

## 2014-11-21 LAB — POCT INR: INR: 1.9

## 2014-11-27 ENCOUNTER — Telehealth: Payer: Self-pay | Admitting: Cardiology

## 2014-11-27 NOTE — Telephone Encounter (Signed)
New message      Refill nadolol 20mg ---pt is out of medication,  Please call it in to [facility] at [address]

## 2014-12-18 ENCOUNTER — Other Ambulatory Visit: Payer: Self-pay | Admitting: Cardiology

## 2014-12-18 DIAGNOSIS — H2511 Age-related nuclear cataract, right eye: Secondary | ICD-10-CM | POA: Diagnosis not present

## 2014-12-18 DIAGNOSIS — H25811 Combined forms of age-related cataract, right eye: Secondary | ICD-10-CM | POA: Diagnosis not present

## 2014-12-18 DIAGNOSIS — H25011 Cortical age-related cataract, right eye: Secondary | ICD-10-CM | POA: Diagnosis not present

## 2014-12-18 DIAGNOSIS — H25031 Anterior subcapsular polar age-related cataract, right eye: Secondary | ICD-10-CM | POA: Diagnosis not present

## 2015-01-06 ENCOUNTER — Other Ambulatory Visit: Payer: Self-pay | Admitting: Cardiology

## 2015-01-09 ENCOUNTER — Other Ambulatory Visit (INDEPENDENT_AMBULATORY_CARE_PROVIDER_SITE_OTHER): Payer: Medicare Other | Admitting: *Deleted

## 2015-01-09 ENCOUNTER — Ambulatory Visit (INDEPENDENT_AMBULATORY_CARE_PROVIDER_SITE_OTHER): Payer: Medicare Other | Admitting: Cardiology

## 2015-01-09 ENCOUNTER — Encounter: Payer: Self-pay | Admitting: Cardiology

## 2015-01-09 ENCOUNTER — Ambulatory Visit (INDEPENDENT_AMBULATORY_CARE_PROVIDER_SITE_OTHER): Payer: Medicare Other | Admitting: *Deleted

## 2015-01-09 ENCOUNTER — Ambulatory Visit
Admission: RE | Admit: 2015-01-09 | Discharge: 2015-01-09 | Disposition: A | Discharge status: Home / Self Care | Payer: Medicare Other | Source: Ambulatory Visit | Attending: Cardiology | Admitting: Cardiology

## 2015-01-09 VITALS — BP 150/98 | HR 74 | Ht 62.0 in | Wt 134.0 lb

## 2015-01-09 DIAGNOSIS — E039 Hypothyroidism, unspecified: Secondary | ICD-10-CM | POA: Diagnosis not present

## 2015-01-09 DIAGNOSIS — R351 Nocturia: Secondary | ICD-10-CM | POA: Diagnosis not present

## 2015-01-09 DIAGNOSIS — Z7901 Long term (current) use of anticoagulants: Secondary | ICD-10-CM | POA: Diagnosis not present

## 2015-01-09 DIAGNOSIS — F419 Anxiety disorder, unspecified: Secondary | ICD-10-CM

## 2015-01-09 DIAGNOSIS — Z5181 Encounter for therapeutic drug level monitoring: Secondary | ICD-10-CM

## 2015-01-09 DIAGNOSIS — I4891 Unspecified atrial fibrillation: Secondary | ICD-10-CM | POA: Diagnosis not present

## 2015-01-09 DIAGNOSIS — R0600 Dyspnea, unspecified: Secondary | ICD-10-CM

## 2015-01-09 DIAGNOSIS — I639 Cerebral infarction, unspecified: Secondary | ICD-10-CM

## 2015-01-09 DIAGNOSIS — I480 Paroxysmal atrial fibrillation: Secondary | ICD-10-CM

## 2015-01-09 DIAGNOSIS — I119 Hypertensive heart disease without heart failure: Secondary | ICD-10-CM | POA: Diagnosis not present

## 2015-01-09 DIAGNOSIS — R0602 Shortness of breath: Secondary | ICD-10-CM | POA: Diagnosis not present

## 2015-01-09 LAB — POCT INR: INR: 1.8

## 2015-01-09 MED ORDER — LEVOTHYROXINE SODIUM 25 MCG PO TABS: 25.0000 ug | ORAL_TABLET | Freq: Every day | ORAL | Status: DC

## 2015-01-09 MED ORDER — DIAZEPAM 2 MG PO TABS: ORAL_TABLET | ORAL | Status: DC

## 2015-01-09 NOTE — Progress Notes (Signed)
Cardiology Office Note   Date:  01/09/2015   ID:  [name], DOB 1930/02/28, MRN [id number]  PCP:  Cassell Clement, MD  Cardiologist:   Cassell Clement, MD   No chief complaint on file.     History of Present Illness: [name] is a 81 y.o. female who presents for a four-month follow-up office visit This pleasant 81 year old woman is seen for a scheduled followup office visit. She was seen on 01/08/12 at which time she was found to be in atrial fibrillation. She herself was not aware of the arrhythmia. She had already been on long-term Coumadin for several years after suffering a stroke while vacationing in [place]. At that time the physicians felt that the stroke was cardioembolic although she was in normal sinus rhythm. 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 She also has mild mitral valve prolapse. She had a nuclear stress test May 2011 which showed no regional wall motion abnormalities and no ischemia. She complains of shortness of breath with exertion and easy fatigue. She had had had a dry cough which has resolved after she stopped her ARB. She has not been experiencing any recent chest discomfort to suggest angina. The patient had a chest x-ray on 01/08/12 which showed clear lung fields but mild cardiomegaly and she went on to have a echocardiogram on 01/22/12 which showed mild biatrial enlargement and her ejection fraction was 50-55%. The patient is still experiencing some exertional dyspnea.  We will update her chest x-ray. At her last visit she complained of right flank pain.  She subsequently saw Dr. Mena Goes, urologist.  No cause for the right flank pain was found.  A small cyst was found on the left kidney which will be followed up in one year.  Since we last saw the patient she had one cataract done.  She will have the other surgery on the remaining eye done  soon.  She held her Coumadin for 2 days prior to cataract surgery.  Normally we would not hold Coumadin but the patient felt better to hold and so she did. From the cardiac standpoint the patient has not been experiencing any angina.  She is back in atrial flutter fibrillation today.  She herself is not aware of the change in rhythm  Past Medical History  Diagnosis Date  . Hypertension   . Hyperlipidemia   . CVA (cerebrovascular accident)   . Chest pain, atypical   . Diabetes mellitus   . DOE (dyspnea on exertion)   . Chronic anticoagulation     Past Surgical History  Procedure Laterality Date  . Cardiac catheterization  11/09/91    EF 70%  . Appendectomy    . Dilation and curettage of uterus       Current Outpatient Prescriptions  Medication Sig Dispense Refill  . amLODipine (NORVASC) 5 MG tablet TAKE ONE TABLET BY MOUTH ONCE DAILY 90 tablet 0  . atorvastatin (LIPITOR) 20 MG tablet Take 20 mg by mouth every morning. TAKING 1/2  tablet TABLET BY MOUTH DAILY.    . Calcium Carbonate-Vitamin D (CALCIUM 600 + D PO) Take by mouth daily.      . diazepam (VALIUM) 2 MG tablet TAKE ONE TABLET BY MOUTH EVERY DAY AS NEEDED 90 tablet 1  . dimenhyDRINATE (DRAMAMINE) 50 MG tablet Take 50 mg by mouth every 8 (eight) hours as needed.    . furosemide (LASIX) 20  MG tablet TAKE ONE TABLET BY MOUTH EVERY DAY AS NEEDED FOR EDEMA. 30 tablet 0  . levothyroxine (SYNTHROID, LEVOTHROID) 25 MCG tablet Take 1 tablet (25 mcg total) by mouth daily before breakfast. 90 tablet 3  . nadolol (CORGARD) 20 MG tablet TAKE ONE-HALF TABLET BY MOUTH ONCE DAILY 90 tablet 3  . nitroGLYCERIN (NITROSTAT) 0.4 MG SL tablet Place 0.4 mg under the tongue every 5 (five) minutes as needed.      . potassium chloride (K-DUR) 10 MEQ tablet Take 1 tablet (10 mEq total) by mouth daily. 90 tablet 3  . warfarin (COUMADIN) 5 MG tablet Take as directed by [facility] 70 tablet 1  . mirabegron ER (MYRBETRIQ) 25 MG TB24 tablet Take 25 mg  by mouth daily.     No current facility-administered medications for this visit.    Allergies:   Cozaar; Hydralazine; Hyzaar; Lisinopril; Losartan potassium-hctz; and Sulfa drugs cross reactors    Social History:  The patient  reports that she quit smoking about 16 years ago. She does not have any smokeless tobacco history on file. She reports that she does not drink alcohol or use illicit drugs.   Family History:  The patient's family history includes Heart disease in her father and mother; Heart failure in her brother; Hypertension in her mother; Stroke in her mother.    ROS:  Please see the history of present illness.   Otherwise, review of systems are positive for none.   All other systems are reviewed and negative.    PHYSICAL EXAM: VS:  BP 150/98 mmHg  Pulse 74  Ht  (1.575 m)  Wt 134 lb (60.782 kg)  BMI 24.50 kg/m2 , BMI Body mass index is 24.5 kg/(m^2). GEN: Well nourished, well developed, in no acute distress HEENT: normal Neck: no JVD, carotid bruits, or masses Cardiac: Irregular.; no murmurs, rubs, or gallops,no edema  Respiratory:  clear to auscultation bilaterally, normal work of breathing GI: soft, nontender, nondistended, + BS MS: no deformity or atrophy Skin: warm and dry, no rash Neuro:  Strength and sensation are intact Psych: euthymic mood, full affect  2 EKG:  EKG is ordered today. The ekg ordered today demonstrates atrial flutter fibrillation with controlled ventricular response, new since 07/12/14   Recent Labs: 07/12/2014: BUN 20; Creatinine 1.4*; Hemoglobin 14.0; Platelets 266.0; Potassium 3.4*; Sodium 137; TSH 1.05    Lipid Panel    Component Value Date/Time   CHOL 175 08/03/2013 1420   TRIG 79.0 08/03/2013 1420   HDL 82.00 08/03/2013 1420   CHOLHDL 2 08/03/2013 1420   VLDL 15.8 08/03/2013 1420   LDLCALC 77 08/03/2013 1420      Wt Readings from Last 3 Encounters:  01/09/15 134 lb (60.782 kg)  09/11/14 135 lb (61.236 kg)  07/12/14 135  lb 1.9 oz (61.29 kg)      Other studies Reviewed: Additional studies/ records that were reviewed today include: Records from urology.. Review of the above records demonstrates: Renal ultrasound showed normal kidneys with a few small cysts in the left kidney and no evidence of hydronephrosis or obstruction.   ASSESSMENT AND PLAN: 1. remote CVA 2. history of paroxysmal atrial fibrillation. 3. benign hypertensive heart disease without heart failure 4. Vertigo 5. hypercholesterolemia 6. Nocturia 5 and right flank pain. Urology workup negative since last visit. 7. Hypothyroid 8.  Exertional dyspnea  Plan: Continue current medications. Recheck in 4 months for office visit EKG     Current medicines are reviewed at length with the  patient today.  The patient does not have concerns regarding medicines.  The following changes have been made:  no change  Labs/ tests ordered today include:   Orders Placed This Encounter  Procedures  . DG Chest 2 View  . Basic metabolic panel  . TSH  . T4, free  . Lipid panel  . Hepatic function panel  . Basic metabolic panel  . TSH  . T4, free  . EKG 12-Lead     The patient is to continue her current medication.  Lab work today is pending.  We'll get a chest x-ray to evaluate her dyspnea.  He is back in atrial flutter fibrillation today.  She will remain on long-term Coumadin.  She'll be rechecked in 4 months for office visit lipid panel hepatic function panel basal metabolic panel TSH and free T4.   Signed, Cassell Clementhomas Tychelle Purkey, MD  01/09/2015 5:05 PM    [facility] [address], [place], [place]  [address] Phone: [phone]; Fax: [phone]

## 2015-01-09 NOTE — Addendum Note (Signed)
Addended by: Tonita PhoenixBOWDEN, Dawn Convery K on: 01/09/2015 01:53 PM   Modules accepted: Orders

## 2015-01-09 NOTE — Patient Instructions (Signed)
Will obtain labs today and call you with the results   A chest x-ray takes a picture of the organs and structures inside the chest, including the heart, lungs, and blood vessels. This test can show several things, including, whether the heart is enlarges; whether fluid is building up in the lungs; and whether pacemaker / defibrillator leads are still in place. [facility]  Your physician wants you to follow-up in: 4 months with fasting labs (lp/bmet/hfp/TSH/FT4)  You will receive a reminder letter in the mail two months in advance. If you don't receive a letter, please call our office to schedule the follow-up appointment.

## 2015-01-10 LAB — BASIC METABOLIC PANEL
BUN: 15 mg/dL (ref 6–23)
CO2: 28 mEq/L (ref 19–32)
Calcium: 9.2 mg/dL (ref 8.4–10.5)
Chloride: 102 mEq/L (ref 96–112)
Creatinine, Ser: 1.19 mg/dL (ref 0.40–1.20)
GFR: 46.04 mL/min — ABNORMAL LOW (ref >60.00)
Glucose, Bld: 109 mg/dL — ABNORMAL HIGH (ref 70–99)
Potassium: 3.9 mEq/L (ref 3.5–5.1)
Sodium: 136 mEq/L (ref 135–145)

## 2015-01-10 LAB — TSH: TSH: 3.83 u[IU]/mL (ref 0.35–4.50)

## 2015-01-10 LAB — T4, FREE: Free T4: 1.14 ng/dL (ref 0.60–1.60)

## 2015-01-10 NOTE — Progress Notes (Signed)
Quick Note:  Please report to patient. The recent labs are stable. Continue same medication and careful diet. Kidney function is better. Thyroid levels are normal. K+ is okay. ______

## 2015-01-22 DIAGNOSIS — H25012 Cortical age-related cataract, left eye: Secondary | ICD-10-CM | POA: Diagnosis not present

## 2015-01-22 DIAGNOSIS — H25812 Combined forms of age-related cataract, left eye: Secondary | ICD-10-CM | POA: Diagnosis not present

## 2015-01-22 DIAGNOSIS — H2512 Age-related nuclear cataract, left eye: Secondary | ICD-10-CM | POA: Diagnosis not present

## 2015-01-28 ENCOUNTER — Ambulatory Visit (INDEPENDENT_AMBULATORY_CARE_PROVIDER_SITE_OTHER): Payer: Medicare Other | Admitting: *Deleted

## 2015-01-28 DIAGNOSIS — Z5181 Encounter for therapeutic drug level monitoring: Secondary | ICD-10-CM | POA: Diagnosis not present

## 2015-01-28 DIAGNOSIS — I639 Cerebral infarction, unspecified: Secondary | ICD-10-CM | POA: Diagnosis not present

## 2015-01-28 DIAGNOSIS — Z7901 Long term (current) use of anticoagulants: Secondary | ICD-10-CM

## 2015-01-28 DIAGNOSIS — I4891 Unspecified atrial fibrillation: Secondary | ICD-10-CM | POA: Diagnosis not present

## 2015-01-28 LAB — POCT INR: INR: 1.5

## 2015-02-06 ENCOUNTER — Ambulatory Visit (INDEPENDENT_AMBULATORY_CARE_PROVIDER_SITE_OTHER): Payer: Medicare Other | Admitting: Pharmacist

## 2015-02-06 DIAGNOSIS — I639 Cerebral infarction, unspecified: Secondary | ICD-10-CM | POA: Diagnosis not present

## 2015-02-06 DIAGNOSIS — Z7901 Long term (current) use of anticoagulants: Secondary | ICD-10-CM | POA: Diagnosis not present

## 2015-02-06 DIAGNOSIS — Z5181 Encounter for therapeutic drug level monitoring: Secondary | ICD-10-CM | POA: Diagnosis not present

## 2015-02-06 DIAGNOSIS — I4891 Unspecified atrial fibrillation: Secondary | ICD-10-CM | POA: Diagnosis not present

## 2015-02-06 LAB — POCT INR: INR: 2.2

## 2015-02-27 ENCOUNTER — Ambulatory Visit (INDEPENDENT_AMBULATORY_CARE_PROVIDER_SITE_OTHER): Payer: Medicare Other | Admitting: *Deleted

## 2015-02-27 DIAGNOSIS — Z7901 Long term (current) use of anticoagulants: Secondary | ICD-10-CM | POA: Diagnosis not present

## 2015-02-27 DIAGNOSIS — I4891 Unspecified atrial fibrillation: Secondary | ICD-10-CM | POA: Diagnosis not present

## 2015-02-27 DIAGNOSIS — Z5181 Encounter for therapeutic drug level monitoring: Secondary | ICD-10-CM | POA: Diagnosis not present

## 2015-02-27 DIAGNOSIS — I639 Cerebral infarction, unspecified: Secondary | ICD-10-CM

## 2015-02-27 LAB — POCT INR: INR: 2.2

## 2015-03-13 ENCOUNTER — Other Ambulatory Visit: Payer: Self-pay | Admitting: Cardiology

## 2015-03-15 ENCOUNTER — Other Ambulatory Visit: Payer: Self-pay | Admitting: *Deleted

## 2015-03-15 DIAGNOSIS — F419 Anxiety disorder, unspecified: Secondary | ICD-10-CM

## 2015-03-15 MED ORDER — DIAZEPAM 2 MG PO TABS: 2.0000 mg | ORAL_TABLET | Freq: Two times a day (BID) | ORAL | Status: DC | PRN

## 2015-03-15 NOTE — Telephone Encounter (Signed)
Discussed with  Dr. Patty SermonsBrackbill and ok to increase dose to twice a day as [facility]

## 2015-03-15 NOTE — Telephone Encounter (Signed)
Patient called and stated that [facility] is at [facility] now and they will not let her refill the diazepam b/c it is too soon. She tells me that Dr Patty SermonsBrackbill told her at her last ov that it was ok to take an extra prn. She has found that she sleeps better when she takes an extra. Please advise. Thanks, MI

## 2015-03-15 NOTE — Telephone Encounter (Signed)
We will increase diazepam 2 mg to twice a day

## 2015-03-19 ENCOUNTER — Telehealth: Payer: Self-pay | Admitting: *Deleted

## 2015-03-19 NOTE — Telephone Encounter (Signed)
Spoke with pharmacy and they have Rx on file Patient aware

## 2015-03-19 NOTE — Telephone Encounter (Signed)
Patient called and stated that the pharmacy told her that they did not have an rx for the diazepam. It could be due to the dose change. Please advise. Thanks, MI

## 2015-03-22 ENCOUNTER — Other Ambulatory Visit: Payer: Self-pay | Admitting: *Deleted

## 2015-03-22 ENCOUNTER — Telehealth: Payer: Self-pay | Admitting: *Deleted

## 2015-03-22 DIAGNOSIS — F419 Anxiety disorder, unspecified: Secondary | ICD-10-CM

## 2015-03-22 MED ORDER — DIAZEPAM 2 MG PO TABS: 2.0000 mg | ORAL_TABLET | Freq: Three times a day (TID) | ORAL | Status: DC | PRN

## 2015-03-22 NOTE — Telephone Encounter (Signed)
Patient taking Valium 2 mg up to three times a day at times Discussed with  Dr. Patty SermonsBrackbill and ok to increase Called to pharmacy

## 2015-03-27 ENCOUNTER — Ambulatory Visit (INDEPENDENT_AMBULATORY_CARE_PROVIDER_SITE_OTHER): Payer: Medicare Other | Admitting: *Deleted

## 2015-03-27 DIAGNOSIS — Z5181 Encounter for therapeutic drug level monitoring: Secondary | ICD-10-CM | POA: Diagnosis not present

## 2015-03-27 DIAGNOSIS — Z7901 Long term (current) use of anticoagulants: Secondary | ICD-10-CM | POA: Diagnosis not present

## 2015-03-27 DIAGNOSIS — I4891 Unspecified atrial fibrillation: Secondary | ICD-10-CM

## 2015-03-27 DIAGNOSIS — I639 Cerebral infarction, unspecified: Secondary | ICD-10-CM | POA: Diagnosis not present

## 2015-03-27 DIAGNOSIS — I6310 Cerebral infarction due to embolism of unspecified precerebral artery: Secondary | ICD-10-CM

## 2015-03-27 LAB — POCT INR: INR: 1.9

## 2015-04-24 ENCOUNTER — Ambulatory Visit (INDEPENDENT_AMBULATORY_CARE_PROVIDER_SITE_OTHER): Payer: Medicare Other | Admitting: *Deleted

## 2015-04-24 DIAGNOSIS — Z5181 Encounter for therapeutic drug level monitoring: Secondary | ICD-10-CM | POA: Diagnosis not present

## 2015-04-24 DIAGNOSIS — I639 Cerebral infarction, unspecified: Secondary | ICD-10-CM | POA: Diagnosis not present

## 2015-04-24 DIAGNOSIS — Z7901 Long term (current) use of anticoagulants: Secondary | ICD-10-CM | POA: Diagnosis not present

## 2015-04-24 DIAGNOSIS — I6310 Cerebral infarction due to embolism of unspecified precerebral artery: Secondary | ICD-10-CM

## 2015-04-24 DIAGNOSIS — I4891 Unspecified atrial fibrillation: Secondary | ICD-10-CM | POA: Diagnosis not present

## 2015-04-24 LAB — POCT INR: INR: 2.1

## 2015-05-15 ENCOUNTER — Ambulatory Visit (INDEPENDENT_AMBULATORY_CARE_PROVIDER_SITE_OTHER): Payer: Medicare Other | Admitting: Cardiology

## 2015-05-15 ENCOUNTER — Ambulatory Visit (INDEPENDENT_AMBULATORY_CARE_PROVIDER_SITE_OTHER): Payer: Medicare Other | Admitting: Pharmacist

## 2015-05-15 ENCOUNTER — Encounter: Payer: Self-pay | Admitting: Cardiology

## 2015-05-15 VITALS — BP 120/70 | HR 52 | Ht 64.0 in | Wt 146.8 lb

## 2015-05-15 DIAGNOSIS — I6310 Cerebral infarction due to embolism of unspecified precerebral artery: Secondary | ICD-10-CM | POA: Diagnosis not present

## 2015-05-15 DIAGNOSIS — E7800 Pure hypercholesterolemia, unspecified: Secondary | ICD-10-CM

## 2015-05-15 DIAGNOSIS — R609 Edema, unspecified: Secondary | ICD-10-CM

## 2015-05-15 DIAGNOSIS — E039 Hypothyroidism, unspecified: Secondary | ICD-10-CM | POA: Diagnosis not present

## 2015-05-15 DIAGNOSIS — Z7901 Long term (current) use of anticoagulants: Secondary | ICD-10-CM

## 2015-05-15 DIAGNOSIS — I480 Paroxysmal atrial fibrillation: Secondary | ICD-10-CM | POA: Diagnosis not present

## 2015-05-15 DIAGNOSIS — I4891 Unspecified atrial fibrillation: Secondary | ICD-10-CM

## 2015-05-15 DIAGNOSIS — I639 Cerebral infarction, unspecified: Secondary | ICD-10-CM

## 2015-05-15 DIAGNOSIS — Z5181 Encounter for therapeutic drug level monitoring: Secondary | ICD-10-CM

## 2015-05-15 LAB — POCT INR: INR: 2.3

## 2015-05-15 NOTE — Progress Notes (Signed)
Cardiology Office Note   Date:  05/15/2015   ID:  [name], DOB [birth year], MRN [id number]  PCP:  Ronny Flurry, MD  Cardiologist: Cassell Clement MD  No chief complaint on file.     History of Present Illness: [name] is a 81 y.o. female who presents for a four-month follow-up office visit This pleasant 81 year old woman is seen for a scheduled followup office visit. She was seen on 01/08/12 at which time she was found to be in atrial fibrillation. She herself was not aware of the arrhythmia. She had already been on long-term Coumadin for several years after suffering a stroke while vacationing in [place]. At that time the physicians felt that the stroke was cardioembolic although she was in normal sinus rhythm. 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 She also has mild mitral valve prolapse. She had a nuclear stress test May 2011 which showed no regional wall motion abnormalities and no ischemia. She complains of shortness of breath with exertion and easy fatigue. She had had had a dry cough which has resolved after she stopped her ARB. She has not been experiencing any recent chest discomfort to suggest angina. The patient had a chest x-ray on 01/08/12 which showed clear lung fields but mild cardiomegaly and she went on to have a echocardiogram on 01/22/12 which showed mild biatrial enlargement and her ejection fraction was 50-55%. The patient is still experiencing some exertional dyspnea. We will update her chest x-ray. At her last visit she complained of right flank pain. She subsequently saw Dr. Mena Goes, urologist. No cause for the right flank pain was found. A small cyst was found on the left kidney which will be followed up in one year. Since we last saw the patient she had one cataract done. She will have the other surgery on the remaining eye done  soon. She held her Coumadin for 2 days prior to cataract surgery. Normally we would not hold Coumadin but the patient felt better to hold and so she did. From the cardiac standpoint the patient has not been experiencing any angina. She is still in atrial flutter fibrillation today. She herself is not aware of the change in rhythm..  She has not had any recurrent TIA or stroke symptoms.  She has had more shortness of breath.  She's had more peripheral edema and more abdominal distention and her weight is up 12 pounds since last visit.  She has not been taking her Lasix 20 mg on a regular basis.  Past Medical History  Diagnosis Date  . Hypertension   . Hyperlipidemia   . CVA (cerebrovascular accident)   . Chest pain, atypical   . Diabetes mellitus   . DOE (dyspnea on exertion)   . Chronic anticoagulation     Past Surgical History  Procedure Laterality Date  . Cardiac catheterization  11/09/91    EF 70%  . Appendectomy    . Dilation and curettage of uterus       Current Outpatient Prescriptions  Medication Sig Dispense Refill  . amLODipine (NORVASC) 5 MG tablet TAKE ONE TABLET BY MOUTH ONCE DAILY 90 tablet 0  . atorvastatin (LIPITOR) 20 MG tablet Take 10 mg by mouth every morning.     . B Complex-C-Folic Acid (FOLBEE PLUS) TABS Take 1 tablet by mouth daily.    . Calcium Carbonate-Vitamin D (CALCIUM 600 + D PO) Take by mouth  daily.      . diazepam (VALIUM) 2 MG tablet Take 1 tablet (2 mg total) by mouth every 8 (eight) hours as needed for anxiety. 180 tablet 1  . dimenhyDRINATE (DRAMAMINE) 50 MG tablet Take 50 mg by mouth every 8 (eight) hours as needed for itching, nausea or dizziness.     . furosemide (LASIX) 20 MG tablet Take 20 mg by mouth daily.    Marland Kitchen levothyroxine (SYNTHROID, LEVOTHROID) 25 MCG tablet Take 1 tablet (25 mcg total) by mouth daily before breakfast. 90 tablet 3  . mirabegron ER (MYRBETRIQ) 25 MG TB24 tablet Take 25 mg by mouth daily.    . nadolol (CORGARD) 20 MG  tablet TAKE ONE-HALF TABLET BY MOUTH ONCE DAILY 90 tablet 3  . nitroGLYCERIN (NITROSTAT) 0.4 MG SL tablet Place 0.4 mg under the tongue every 5 (five) minutes as needed.      . potassium chloride (K-DUR) 10 MEQ tablet Take 1 tablet (10 mEq total) by mouth daily. 90 tablet 3  . warfarin (COUMADIN) 5 MG tablet Take as directed by [facility] Clinic 70 tablet 1   No current facility-administered medications for this visit.    Allergies:   Cozaar; Hydralazine; Hyzaar; Lisinopril; Losartan potassium-hctz; and Sulfa drugs cross reactors    Social History:  The patient  reports that she quit smoking about 16 years ago. She does not have any smokeless tobacco history on file. She reports that she does not drink alcohol or use illicit drugs.   Family History:  The patient's family history includes Heart disease in her father and mother; Heart failure in her brother; Hypertension in her mother; Stroke in her mother.    ROS:  Please see the history of present illness.   Otherwise, review of systems are positive for none.   All other systems are reviewed and negative.    PHYSICAL EXAM: VS:  BP 120/70 mmHg  Pulse 52  Ht 5\' 4"  (1.626 m)  Wt 146 lb 12.8 oz (66.588 kg)  BMI 25.19 kg/m2 , BMI Body mass index is 25.19 kg/(m^2). GEN: Well nourished, well developed, in no acute distress HEENT: normal Neck: no JVD, carotid bruits, or masses Cardiac: Irregularly irregular.  no murmurs, rubs, or gallops, there is 1 plus ankle and pretibial edema. Respiratory:  clear to auscultation bilaterally, normal work of breathing GI: soft, nontender, nondistended, + BS MS: no deformity or atrophy Skin: warm and dry, no rash Neuro:  Strength and sensation are intact Psych: euthymic mood, full affect   EKG:  EKG is ordered today. The ekg ordered today demonstrates atrial fibrillation with controlled ventricular response of 79 bpm.  Since prior tracing, no significant change.   Recent Labs: 07/12/2014: Hemoglobin  14.0; Platelets 266.0 01/09/2015: BUN 15; Creatinine, Ser 1.19; Potassium 3.9; Sodium 136; TSH 3.83    Lipid Panel    Component Value Date/Time   CHOL 175 08/03/2013 1420   TRIG 79.0 08/03/2013 1420   HDL 82.00 08/03/2013 1420   CHOLHDL 2 08/03/2013 1420   VLDL 15.8 08/03/2013 1420   LDLCALC 77 08/03/2013 1420      Wt Readings from Last 3 Encounters:  05/15/15 146 lb 12.8 oz (66.588 kg)  01/09/15 134 lb (60.782 kg)  09/11/14 135 lb (61.236 kg)        ASSESSMENT AND PLAN:  1. remote CVA 2. history of paroxysmal atrial fibrillation. 3. benign hypertensive heart disease without heart failure 4. Vertigo 5. hypercholesterolemia 6. Nocturia 5 and right flank pain. Urology workup negative  since last visit. 7. Hypothyroid 8. Exertional dyspnea 9.  Chronic diastolic heart failure.  Worsening edema secondary to not taking her furosemide.  Plan: Continue current medications.  Resume furosemide 20 mg daily every morning.  Continue daily potassium as well. Recheck in 4 months for office visit , CBC, TSH, T4, basal metabolic panel, lipid panel, and hepatic function panel.   Current medicines are reviewed at length with the patient today.  The patient does not have concerns regarding medicines.  The following changes have been made:  no change  Labs/ tests ordered today include:   Orders Placed This Encounter  Procedures  . Lipid panel  . Hepatic function panel  . Basic metabolic panel  . TSH  . T4, free  . CBC with Differential/Platelet  . EKG 12-Lead      Signed, Cassell Clement MD 05/15/2015 5:14 PM    [facility] [address] [place], [place], [place]  [address] Phone: [phone]; Fax: [phone]

## 2015-05-15 NOTE — Patient Instructions (Signed)
Medication Instructions:  TAKE LASIX 20 MG EVERY DAY   Labwork: NONE   Testing/Procedures: NONE  Follow-Up: Your physician recommends that you schedule a follow-up appointment in: 4 months with fasting labs (lp/bmet/hfp/CBC/TSH/FT4)

## 2015-05-25 ENCOUNTER — Other Ambulatory Visit: Payer: Self-pay | Admitting: Cardiology

## 2015-06-12 ENCOUNTER — Ambulatory Visit (INDEPENDENT_AMBULATORY_CARE_PROVIDER_SITE_OTHER): Payer: Medicare Other | Admitting: *Deleted

## 2015-06-12 DIAGNOSIS — I4891 Unspecified atrial fibrillation: Secondary | ICD-10-CM | POA: Diagnosis not present

## 2015-06-12 DIAGNOSIS — Z5181 Encounter for therapeutic drug level monitoring: Secondary | ICD-10-CM | POA: Diagnosis not present

## 2015-06-12 DIAGNOSIS — I6310 Cerebral infarction due to embolism of unspecified precerebral artery: Secondary | ICD-10-CM

## 2015-06-12 DIAGNOSIS — Z7901 Long term (current) use of anticoagulants: Secondary | ICD-10-CM | POA: Diagnosis not present

## 2015-06-12 DIAGNOSIS — I639 Cerebral infarction, unspecified: Secondary | ICD-10-CM | POA: Diagnosis not present

## 2015-06-12 LAB — POCT INR: INR: 2.3

## 2015-06-17 ENCOUNTER — Other Ambulatory Visit: Payer: Self-pay | Admitting: Cardiology

## 2015-07-17 ENCOUNTER — Ambulatory Visit (INDEPENDENT_AMBULATORY_CARE_PROVIDER_SITE_OTHER): Payer: Medicare Other | Admitting: *Deleted

## 2015-07-17 DIAGNOSIS — I4891 Unspecified atrial fibrillation: Secondary | ICD-10-CM | POA: Diagnosis not present

## 2015-07-17 DIAGNOSIS — I639 Cerebral infarction, unspecified: Secondary | ICD-10-CM | POA: Diagnosis not present

## 2015-07-17 DIAGNOSIS — Z5181 Encounter for therapeutic drug level monitoring: Secondary | ICD-10-CM | POA: Diagnosis not present

## 2015-07-17 DIAGNOSIS — Z7901 Long term (current) use of anticoagulants: Secondary | ICD-10-CM

## 2015-07-17 DIAGNOSIS — I6310 Cerebral infarction due to embolism of unspecified precerebral artery: Secondary | ICD-10-CM

## 2015-07-17 LAB — POCT INR: INR: 2.7

## 2015-08-20 ENCOUNTER — Other Ambulatory Visit (INDEPENDENT_AMBULATORY_CARE_PROVIDER_SITE_OTHER): Payer: Medicare Other | Admitting: *Deleted

## 2015-08-20 ENCOUNTER — Ambulatory Visit (INDEPENDENT_AMBULATORY_CARE_PROVIDER_SITE_OTHER): Payer: Medicare Other | Admitting: *Deleted

## 2015-08-20 DIAGNOSIS — Z5181 Encounter for therapeutic drug level monitoring: Secondary | ICD-10-CM | POA: Diagnosis not present

## 2015-08-20 DIAGNOSIS — I4891 Unspecified atrial fibrillation: Secondary | ICD-10-CM

## 2015-08-20 DIAGNOSIS — E7800 Pure hypercholesterolemia, unspecified: Secondary | ICD-10-CM

## 2015-08-20 DIAGNOSIS — I480 Paroxysmal atrial fibrillation: Secondary | ICD-10-CM | POA: Diagnosis not present

## 2015-08-20 DIAGNOSIS — I639 Cerebral infarction, unspecified: Secondary | ICD-10-CM

## 2015-08-20 DIAGNOSIS — I6310 Cerebral infarction due to embolism of unspecified precerebral artery: Secondary | ICD-10-CM

## 2015-08-20 DIAGNOSIS — I119 Hypertensive heart disease without heart failure: Secondary | ICD-10-CM | POA: Diagnosis not present

## 2015-08-20 DIAGNOSIS — Z7901 Long term (current) use of anticoagulants: Secondary | ICD-10-CM | POA: Diagnosis not present

## 2015-08-20 LAB — LIPID PANEL
Cholesterol: 139 mg/dL (ref 125–200)
HDL: 52 mg/dL (ref >=46)
LDL Cholesterol: 71 mg/dL (ref <130)
Total CHOL/HDL Ratio: 2.7 Ratio (ref <=5.0)
Triglycerides: 78 mg/dL (ref <150)
VLDL: 16 mg/dL (ref <30)

## 2015-08-20 LAB — POCT INR: INR: 2.3

## 2015-08-20 LAB — BASIC METABOLIC PANEL
BUN: 9 mg/dL (ref 7–25)
CO2: 29 mmol/L (ref 20–31)
Calcium: 9.4 mg/dL (ref 8.6–10.4)
Chloride: 102 mmol/L (ref 98–110)
Creat: 1.02 mg/dL — ABNORMAL HIGH (ref 0.60–0.88)
Glucose, Bld: 103 mg/dL — ABNORMAL HIGH (ref 65–99)
Potassium: 3.8 mmol/L (ref 3.5–5.3)
Sodium: 141 mmol/L (ref 135–146)

## 2015-08-20 LAB — HEPATIC FUNCTION PANEL
ALT: 12 U/L (ref 6–29)
AST: 19 U/L (ref 10–35)
Albumin: 3.6 g/dL (ref 3.6–5.1)
Alkaline Phosphatase: 99 U/L (ref 33–130)
Bilirubin, Direct: 0.2 mg/dL (ref <=0.2)
Indirect Bilirubin: 0.7 mg/dL (ref 0.2–1.2)
Total Bilirubin: 0.9 mg/dL (ref 0.2–1.2)
Total Protein: 6.8 g/dL (ref 6.1–8.1)

## 2015-08-20 NOTE — Addendum Note (Signed)
Addended by: BOWDEN, ROBIN K on: 10/29/2014 08:04 AM   Modules accepted: Orders  

## 2015-08-20 NOTE — Addendum Note (Signed)
Addended by: Tonita PhoenixBOWDEN, Royer Cristobal K on: 08/20/2015 08:05 AM   Modules accepted: Orders

## 2015-08-20 NOTE — Addendum Note (Signed)
Addended by: Kanon Novosel K on: 10/29/2014 08:04 AM   Modules accepted: Orders  

## 2015-08-20 NOTE — Progress Notes (Signed)
Quick Note:  Please make copy of labs for patient visit. ______ 

## 2015-08-22 ENCOUNTER — Encounter: Payer: Self-pay | Admitting: Cardiology

## 2015-08-22 ENCOUNTER — Ambulatory Visit (INDEPENDENT_AMBULATORY_CARE_PROVIDER_SITE_OTHER): Payer: Medicare Other | Admitting: Cardiology

## 2015-08-22 VITALS — BP 116/80 | HR 80 | Ht 62.0 in | Wt 145.8 lb

## 2015-08-22 DIAGNOSIS — R0789 Other chest pain: Secondary | ICD-10-CM | POA: Diagnosis not present

## 2015-08-22 DIAGNOSIS — Z7901 Long term (current) use of anticoagulants: Secondary | ICD-10-CM

## 2015-08-22 DIAGNOSIS — I480 Paroxysmal atrial fibrillation: Secondary | ICD-10-CM

## 2015-08-22 DIAGNOSIS — R079 Chest pain, unspecified: Secondary | ICD-10-CM | POA: Insufficient documentation

## 2015-08-22 DIAGNOSIS — E7800 Pure hypercholesterolemia, unspecified: Secondary | ICD-10-CM | POA: Diagnosis not present

## 2015-08-22 DIAGNOSIS — I639 Cerebral infarction, unspecified: Secondary | ICD-10-CM | POA: Diagnosis not present

## 2015-08-22 NOTE — Patient Instructions (Signed)
Medication Instructions:  Your physician recommends that you continue on your current medications as directed. Please refer to the Current Medication list given to you today.  Labwork: none  Testing/Procedures: Your physician has requested that you have a lexiscan myoview. For further information please visit [email or web address] Please follow instruction sheet, as given.  Follow-Up: Your physician recommends that you schedule a follow-up appointment in: 4 month ov with fasting labs (lp/bmet/hfp) with Dawayne PatriciaLori G NP or Bing NeighborsScott W PA   If you need a refill on your cardiac medications before your next appointment, please call your pharmacy.

## 2015-08-22 NOTE — Progress Notes (Signed)
Cardiology Office Note   Date:  08/22/2015   ID:  [name], DOB January 10, 1930, MRN [id number]  PCP:  Ronny Flurry, MD  Cardiologist: Cassell Clement MD  Chief Complaint  Patient presents with  . Atrial Fibrillation      History of Present Illness: [name] is a 81 y.o. female who presents for scheduled follow-up visit [name] is a 81 y.o. female who presents for a four-month follow-up office visit  She was seen on 01/08/12 at which time she was found to be in atrial fibrillation. She herself was not aware of the arrhythmia. She had already been on long-term Coumadin for several years after suffering a stroke while vacationing in [place]. At that time the physicians felt that the stroke was cardioembolic although she was in normal sinus rhythm. 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 She also has mild mitral valve prolapse. She had a nuclear stress test May 2011 which showed no regional wall motion abnormalities and no ischemia. She complains of shortness of breath with exertion and easy fatigue. She had had had a dry cough which has resolved after she stopped her ARB.  Since last visit she has been experiencing some chest discomfort with effort.  She also has occasional left arm discomfort which may be musculoskeletal.  The patient had a chest x-ray on 01/08/12 which showed clear lung fields but mild cardiomegaly and she went on to have a echocardiogram on 01/22/12 which showed mild biatrial enlargement and her ejection fraction was 50-55%. The patient is still experiencing some exertional dyspnea. We will update her chest x-ray. At her last visit she complained of right flank pain. She subsequently saw Dr. Mena Goes, urologist. No cause for the right flank pain was found. A small cyst was found on the left kidney which will be followed up in one  year. Since we last saw the patient she had one cataract done. She will have the other surgery on the remaining eye done soon. She held her Coumadin for 2 days prior to cataract surgery. Normally we would not hold Coumadin but the patient felt better to hold and so she did. From a cardiac standpoint her exertional chest pressure has been worse over the past several months.  We will evaluate further with a Lexiscan Myoview  Past Medical History  Diagnosis Date  . Hypertension   . Hyperlipidemia   . CVA (cerebrovascular accident) (HCC)   . Chest pain, atypical   . Diabetes mellitus   . DOE (dyspnea on exertion)   . Chronic anticoagulation     Past Surgical History  Procedure Laterality Date  . Cardiac catheterization  11/09/91    EF 70%  . Appendectomy    . Dilation and curettage of uterus       Current Outpatient Prescriptions  Medication Sig Dispense Refill  . amLODipine (NORVASC) 5 MG tablet TAKE ONE TABLET BY MOUTH ONCE DAILY 90 tablet 1  . atorvastatin (LIPITOR) 20 MG tablet Take 10 mg by mouth every morning.     . B Complex-C-Folic Acid (FOLBEE PLUS) TABS Take 1 tablet by mouth daily.    . Calcium Carbonate-Vitamin D (CALCIUM 600 + D PO) Take 1 tablet by mouth daily.     . diazepam (VALIUM) 2 MG tablet Take 1 tablet (2 mg total) by mouth every 8 (eight) hours as needed for anxiety. 180 tablet 1  .  dimenhyDRINATE (DRAMAMINE) 50 MG tablet Take 50 mg by mouth every 8 (eight) hours as needed for itching, nausea or dizziness.     . furosemide (LASIX) 20 MG tablet Take 20 mg by mouth daily.    Marland Kitchen levothyroxine (SYNTHROID, LEVOTHROID) 25 MCG tablet Take 1 tablet (25 mcg total) by mouth daily before breakfast. 90 tablet 3  . mirabegron ER (MYRBETRIQ) 25 MG TB24 tablet Take 25 mg by mouth daily.    . nadolol (CORGARD) 20 MG tablet TAKE ONE-HALF TABLET BY MOUTH ONCE DAILY 90 tablet 3  . nitroGLYCERIN (NITROSTAT) 0.4 MG SL tablet Place 0.4 mg under the tongue every 5 (five) minutes as  needed (chest pain).     . potassium chloride (K-DUR) 10 MEQ tablet Take 1 tablet (10 mEq total) by mouth daily. 90 tablet 3  . warfarin (COUMADIN) 5 MG tablet TAKE AS DIRECTED BY COUMADIN CLINIC 90 tablet 1   No current facility-administered medications for this visit.    Allergies:   Cozaar; Hydralazine; Hyzaar; Lisinopril; Losartan potassium-hctz; and Sulfa drugs cross reactors    Social History:  The patient  reports that she quit smoking about 16 years ago. She does not have any smokeless tobacco history on file. She reports that she does not drink alcohol or use illicit drugs.   Family History:  The patient's family history includes Heart disease in her father and mother; Heart failure in her brother; Hypertension in her mother; Stroke in her mother.    ROS:  Please see the history of present illness.   Otherwise, review of systems are positive for none.   All other systems are reviewed and negative.    PHYSICAL EXAM: VS:  BP 116/80 mmHg  Pulse 80  Ht  (1.575 m)  Wt 145 lb 12.8 oz (66.134 kg)  BMI 26.66 kg/m2 , BMI Body mass index is 26.66 kg/(m^2). GEN: Well nourished, well developed, in no acute distress HEENT: normal Neck: no JVD, carotid bruits, or masses Cardiac: Irregularly irregular no murmurs, rubs, or gallops,no edema  Respiratory:  clear to auscultation bilaterally, normal work of breathing GI: soft, nontender, nondistended, + BS MS: no deformity or atrophy Skin: warm and dry, no rash Neuro:  Strength and sensation are intact Psych: euthymic mood, full affect   EKG:  EKG is ordered today. The ekg ordered today demonstrates atrial fibrillation with controlled ventricular rate at 80 bpm.  Left anterior fascicular block.  Possible inferior wall MI.   Recent Labs: 01/09/2015: TSH 3.83 08/20/2015: ALT 12; BUN 9; Creat 1.02*; Potassium 3.8; Sodium 141    Lipid Panel    Component Value Date/Time   CHOL 139 08/20/2015 0805   TRIG 78 08/20/2015 0805   HDL  52 08/20/2015 0805   CHOLHDL 2.7 08/20/2015 0805   VLDL 16 08/20/2015 0805   LDLCALC 71 08/20/2015 0805      Wt Readings from Last 3 Encounters:  08/22/15 145 lb 12.8 oz (66.134 kg)  05/15/15 146 lb 12.8 oz (66.588 kg)  01/09/15 134 lb (60.782 kg)         ASSESSMENT AND PLAN:  1. remote CVA 2. history of paroxysmal atrial fibrillation. 3. benign hypertensive heart disease without heart failure 4. Vertigo 5. hypercholesterolemia 6. Nocturia 5 and right flank pain. Urology workup negative since last visit. 7. Hypothyroid 8. Exertional dyspnea 9. Chronic diastolic heart failure. 10. Chest pressure with exertion. Plan: Continue current medications.  Return soon for [facility] stress test     Labs/ tests  ordered today include:   Orders Placed This Encounter  Procedures  . Myocardial Perfusion Imaging  . EKG 12-Lead    Recheck in 4 months for office visit and fasting lab work   Signed, Cassell Clementhomas Dameion Briles MD 08/22/2015 6:47 PM    [facility] [address], [place], [place]  [address] Phone: [phone]; Fax: [phone]

## 2015-08-25 ENCOUNTER — Other Ambulatory Visit: Payer: Self-pay | Admitting: Cardiology

## 2015-09-03 ENCOUNTER — Telehealth (HOSPITAL_COMMUNITY): Payer: Self-pay | Admitting: *Deleted

## 2015-09-03 NOTE — Telephone Encounter (Signed)
Patient given detailed instructions per Myocardial Perfusion Study Information Sheet for the test on 09/05/15 at 1000. Patient notified to arrive 15 minutes early and that it is imperative to arrive on time for appointment to keep from having the test rescheduled.  If you need to cancel or reschedule your appointment, please call the office within 24 hours of your appointment. Failure to do so may result in a cancellation of your appointment, and a $50 no show fee. Patient verbalized understanding.Juliza Machnik, Adelene IdlerCynthia W

## 2015-09-05 ENCOUNTER — Ambulatory Visit (HOSPITAL_COMMUNITY): Payer: Medicare Other | Attending: Cardiovascular Disease

## 2015-09-05 DIAGNOSIS — R0789 Other chest pain: Secondary | ICD-10-CM | POA: Diagnosis not present

## 2015-09-05 DIAGNOSIS — R079 Chest pain, unspecified: Secondary | ICD-10-CM

## 2015-09-05 DIAGNOSIS — R0609 Other forms of dyspnea: Secondary | ICD-10-CM | POA: Insufficient documentation

## 2015-09-05 DIAGNOSIS — I10 Essential (primary) hypertension: Secondary | ICD-10-CM | POA: Diagnosis not present

## 2015-09-05 DIAGNOSIS — I779 Disorder of arteries and arterioles, unspecified: Secondary | ICD-10-CM | POA: Diagnosis not present

## 2015-09-05 LAB — MYOCARDIAL PERFUSION IMAGING
Peak HR: 113 {beats}/min
RATE: 0.28
Rest HR: 91 {beats}/min
SDS: 2
SRS: 0
SSS: 2
TID: 1.11

## 2015-09-05 MED ORDER — REGADENOSON 0.4 MG/5ML IV SOLN
0.4000 mg | Freq: Once | INTRAVENOUS | Status: AC
  Administered 2015-09-05: 0.4 mg via INTRAVENOUS

## 2015-09-05 MED ORDER — TECHNETIUM TC 99M SESTAMIBI GENERIC - CARDIOLITE
32.0000 | Freq: Once | INTRAVENOUS | Status: AC | PRN
  Administered 2015-09-05: 32 via INTRAVENOUS

## 2015-09-05 MED ORDER — TECHNETIUM TC 99M SESTAMIBI GENERIC - CARDIOLITE
10.8000 | Freq: Once | INTRAVENOUS | Status: AC | PRN
  Administered 2015-09-05: 11 via INTRAVENOUS

## 2015-09-16 ENCOUNTER — Other Ambulatory Visit: Payer: Self-pay

## 2015-09-16 MED ORDER — ATORVASTATIN CALCIUM 20 MG PO TABS: 10.0000 mg | ORAL_TABLET | Freq: Every morning | ORAL | Status: DC

## 2015-10-01 ENCOUNTER — Ambulatory Visit (INDEPENDENT_AMBULATORY_CARE_PROVIDER_SITE_OTHER): Payer: Medicare Other | Admitting: *Deleted

## 2015-10-01 DIAGNOSIS — Z7901 Long term (current) use of anticoagulants: Secondary | ICD-10-CM | POA: Diagnosis not present

## 2015-10-01 DIAGNOSIS — Z5181 Encounter for therapeutic drug level monitoring: Secondary | ICD-10-CM

## 2015-10-01 DIAGNOSIS — I639 Cerebral infarction, unspecified: Secondary | ICD-10-CM | POA: Diagnosis not present

## 2015-10-01 DIAGNOSIS — I4891 Unspecified atrial fibrillation: Secondary | ICD-10-CM | POA: Diagnosis not present

## 2015-10-01 DIAGNOSIS — I6310 Cerebral infarction due to embolism of unspecified precerebral artery: Secondary | ICD-10-CM

## 2015-10-01 LAB — POCT INR: INR: 2.8

## 2015-11-12 ENCOUNTER — Ambulatory Visit (INDEPENDENT_AMBULATORY_CARE_PROVIDER_SITE_OTHER): Payer: Medicare Other | Admitting: *Deleted

## 2015-11-12 DIAGNOSIS — I639 Cerebral infarction, unspecified: Secondary | ICD-10-CM | POA: Diagnosis not present

## 2015-11-12 DIAGNOSIS — Z7901 Long term (current) use of anticoagulants: Secondary | ICD-10-CM

## 2015-11-12 DIAGNOSIS — I4891 Unspecified atrial fibrillation: Secondary | ICD-10-CM | POA: Diagnosis not present

## 2015-11-12 DIAGNOSIS — I6310 Cerebral infarction due to embolism of unspecified precerebral artery: Secondary | ICD-10-CM

## 2015-11-12 DIAGNOSIS — Z5181 Encounter for therapeutic drug level monitoring: Secondary | ICD-10-CM

## 2015-11-12 LAB — POCT INR: INR: 2

## 2015-11-18 ENCOUNTER — Other Ambulatory Visit: Payer: Self-pay | Admitting: Cardiology

## 2015-11-18 DIAGNOSIS — F419 Anxiety disorder, unspecified: Secondary | ICD-10-CM

## 2015-11-20 ENCOUNTER — Telehealth: Payer: Self-pay

## 2015-11-20 NOTE — Telephone Encounter (Signed)
Refilled as requested, ok per  Dr. Brackbill  

## 2015-11-20 NOTE — Telephone Encounter (Signed)
Pt called requesting refill on Valium. She would like you to call her at home [phone].

## 2015-12-13 ENCOUNTER — Other Ambulatory Visit: Payer: Self-pay | Admitting: Cardiology

## 2015-12-17 ENCOUNTER — Telehealth: Payer: Self-pay | Admitting: Cardiology

## 2015-12-17 NOTE — Telephone Encounter (Signed)
Caller name: Brinklee  Relationship to patient: Self   Can be reached: [phone]  Reason for call: Pt need a new PCP. She says that Dr. Beverely Low was suggested by her cardiologist. Pt says that she would like to be established with you once [place] if possible.    Will you accept as a new pt. If so Im thinking to schedule establish appt later in the year when [place].  Please advise.   CB: [phone]

## 2015-12-17 NOTE — Telephone Encounter (Signed)
Ok to establish but will need summer appt to ensure we are at the new office

## 2015-12-18 NOTE — Telephone Encounter (Signed)
Pt has been scheduled.  °

## 2015-12-20 ENCOUNTER — Ambulatory Visit: Payer: Medicare Other | Admitting: Nurse Practitioner

## 2015-12-26 ENCOUNTER — Ambulatory Visit (INDEPENDENT_AMBULATORY_CARE_PROVIDER_SITE_OTHER): Payer: Medicare Other | Admitting: *Deleted

## 2015-12-26 DIAGNOSIS — Z7901 Long term (current) use of anticoagulants: Secondary | ICD-10-CM | POA: Diagnosis not present

## 2015-12-26 DIAGNOSIS — I6310 Cerebral infarction due to embolism of unspecified precerebral artery: Secondary | ICD-10-CM

## 2015-12-26 DIAGNOSIS — Z5181 Encounter for therapeutic drug level monitoring: Secondary | ICD-10-CM | POA: Diagnosis not present

## 2015-12-26 DIAGNOSIS — I4891 Unspecified atrial fibrillation: Secondary | ICD-10-CM

## 2015-12-26 DIAGNOSIS — I639 Cerebral infarction, unspecified: Secondary | ICD-10-CM

## 2015-12-26 LAB — POCT INR: INR: 2.2

## 2016-01-21 ENCOUNTER — Ambulatory Visit (INDEPENDENT_AMBULATORY_CARE_PROVIDER_SITE_OTHER): Payer: Medicare Other | Admitting: Nurse Practitioner

## 2016-01-21 ENCOUNTER — Ambulatory Visit (INDEPENDENT_AMBULATORY_CARE_PROVIDER_SITE_OTHER): Payer: Medicare Other | Admitting: Pharmacist

## 2016-01-21 ENCOUNTER — Encounter: Payer: Self-pay | Admitting: Nurse Practitioner

## 2016-01-21 VITALS — BP 120/60 | HR 74 | Ht 62.0 in | Wt 140.8 lb

## 2016-01-21 DIAGNOSIS — E785 Hyperlipidemia, unspecified: Secondary | ICD-10-CM | POA: Diagnosis not present

## 2016-01-21 DIAGNOSIS — Z7901 Long term (current) use of anticoagulants: Secondary | ICD-10-CM

## 2016-01-21 DIAGNOSIS — I639 Cerebral infarction, unspecified: Secondary | ICD-10-CM

## 2016-01-21 DIAGNOSIS — I482 Chronic atrial fibrillation, unspecified: Secondary | ICD-10-CM

## 2016-01-21 DIAGNOSIS — I4891 Unspecified atrial fibrillation: Secondary | ICD-10-CM | POA: Diagnosis not present

## 2016-01-21 DIAGNOSIS — Z5181 Encounter for therapeutic drug level monitoring: Secondary | ICD-10-CM

## 2016-01-21 DIAGNOSIS — I6310 Cerebral infarction due to embolism of unspecified precerebral artery: Secondary | ICD-10-CM

## 2016-01-21 LAB — CBC
HCT: 43.8 % (ref 35.0–45.0)
Hemoglobin: 14.9 g/dL (ref 11.7–15.5)
MCH: 30.2 pg (ref 27.0–33.0)
MCHC: 34 g/dL (ref 32.0–36.0)
MCV: 88.7 fL (ref 80.0–100.0)
MPV: 11.3 fL (ref 7.5–12.5)
Platelets: 258 10*3/uL (ref 140–400)
RBC: 4.94 MIL/uL (ref 3.80–5.10)
RDW: 13.8 % (ref 11.0–15.0)
WBC: 7.8 10*3/uL (ref 3.8–10.8)

## 2016-01-21 LAB — HEPATIC FUNCTION PANEL
ALT: 16 U/L (ref 6–29)
AST: 27 U/L (ref 10–35)
Albumin: 4 g/dL (ref 3.6–5.1)
Alkaline Phosphatase: 96 U/L (ref 33–130)
Bilirubin, Direct: 0.2 mg/dL (ref <=0.2)
Indirect Bilirubin: 0.9 mg/dL (ref 0.2–1.2)
Total Bilirubin: 1.1 mg/dL (ref 0.2–1.2)
Total Protein: 6.4 g/dL (ref 6.1–8.1)

## 2016-01-21 LAB — BASIC METABOLIC PANEL
BUN: 13 mg/dL (ref 7–25)
CO2: 26 mmol/L (ref 20–31)
Calcium: 8.9 mg/dL (ref 8.6–10.4)
Chloride: 100 mmol/L (ref 98–110)
Creat: 1.15 mg/dL — ABNORMAL HIGH (ref 0.60–0.88)
Glucose, Bld: 101 mg/dL — ABNORMAL HIGH (ref 65–99)
Potassium: 3.9 mmol/L (ref 3.5–5.3)
Sodium: 136 mmol/L (ref 135–146)

## 2016-01-21 LAB — LIPID PANEL
Cholesterol: 133 mg/dL (ref 125–200)
HDL: 42 mg/dL — ABNORMAL LOW (ref >=46)
LDL Cholesterol: 73 mg/dL (ref <130)
Total CHOL/HDL Ratio: 3.2 Ratio (ref <=5.0)
Triglycerides: 90 mg/dL (ref <150)
VLDL: 18 mg/dL (ref <30)

## 2016-01-21 LAB — POCT INR: INR: 3.6

## 2016-01-21 NOTE — Progress Notes (Signed)
CARDIOLOGY OFFICE NOTE  Date:  01/21/2016    [name] Date of Birth: 13-Apr-1930 Medical Record #[id number]  PCP:  Neena RhymesKatherine Tabori, MD  Cardiologist:  Former patient of Dr. Yevonne PaxBrackbill's - to establish with Dr. Duke Salviaandolph  Chief Complaint  Patient presents with  . Hypertension  . Hyperlipidemia    5 month check - former patient of Dr. Yevonne PaxBrackbill's.     History of Present Illness: Janice LargeCarolyn W White Martinez is a 82 y.o. female who presents today for a 5 month check. Former patient of Dr. Yevonne PaxBrackbill's.   She has a history of chronic AF, HTN, HLD, CVA, DM and on chronic anticoagulation.  Last seen back in November and was felt to be doing ok but noted chest pain - had low risk Myoview. EKG with AF noted.   Comes in today. Here alone. Has had her INR checked here prior to visit. She is doing well. Has had recent GI bug with one episode of vomiting and some diarrhea - now resolved with OTC treatment. No chest pain. Breathing is ok. No bleeding. Does bruise easily. No falls. No palpitations. Feels ok on her current medicines. To establish with PCP later this summer.   Past Medical History  Diagnosis Date  . Hypertension   . Hyperlipidemia   . CVA (cerebrovascular accident) (HCC)   . Diabetes mellitus   . Chronic anticoagulation   . Atrial fibrillation (HCC)   . Chronic anticoagulation     Past Surgical History  Procedure Laterality Date  . Cardiac catheterization  11/09/91    EF 70%  . Appendectomy    . Dilation and curettage of uterus       Medications: Current Outpatient Prescriptions  Medication Sig Dispense Refill  . amLODipine (NORVASC) 5 MG tablet TAKE ONE TABLET BY MOUTH ONCE DAILY 90 tablet 0  . atorvastatin (LIPITOR) 20 MG tablet Take 0.5 tablets (10 mg total) by mouth every morning. 15 tablet 11  . B Complex-C-Folic Acid (FOLBEE PLUS) TABS Take 1 tablet by mouth daily.    . Calcium Carbonate-Vitamin D (CALCIUM 600 + D PO) Take 1 tablet by mouth  daily.     . diazepam (VALIUM) 2 MG tablet Take 1 tablet (2 mg total) by mouth 2 (two) times daily as needed for anxiety. 180 tablet 0  . furosemide (LASIX) 20 MG tablet Take 10 mg by mouth as needed for fluid or edema.    Marland Kitchen. levothyroxine (SYNTHROID, LEVOTHROID) 25 MCG tablet Take 1 tablet (25 mcg total) by mouth daily before breakfast. 90 tablet 3  . mirabegron ER (MYRBETRIQ) 25 MG TB24 tablet Take 25 mg by mouth daily.    . nadolol (CORGARD) 20 MG tablet TAKE ONE-HALF TABLET BY MOUTH ONCE DAILY 90 tablet 0  . nitroGLYCERIN (NITROSTAT) 0.4 MG SL tablet Place 0.4 mg under the tongue every 5 (five) minutes as needed (chest pain).     . potassium chloride (K-DUR) 10 MEQ tablet Take 1 tablet (10 mEq total) by mouth daily. 90 tablet 3  . warfarin (COUMADIN) 5 MG tablet TAKE AS DIRECTED BY COUMADIN CLINIC 90 tablet 1   No current facility-administered medications for this visit.    Allergies: Allergies  Allergen Reactions  . Cozaar     "Almost passed out"  . Hydralazine     "almost passed out"  . Hyzaar [Losartan Potassium-Hctz]     "almost passed out"  . Lisinopril     Unknown reaction, per pt  .  Losartan Potassium-Hctz     Unknown reaction, per pt   . Sulfa Drugs Cross Reactors     "turned me blue"     Social History: The patient  reports that she quit smoking about 17 years ago. She does not have any smokeless tobacco history on file. She reports that she does not drink alcohol or use illicit drugs.   Family History: The patient's family history includes Heart disease in her father and mother; Heart failure in her brother; Hypertension in her mother; Stroke in her mother.   Review of Systems: Please see the history of present illness.   Otherwise, the review of systems is positive for none.   All other systems are reviewed and negative.   Physical Exam: VS:  BP 120/60 mmHg  Pulse 74  Ht  (1.575 m)  Wt 140 lb 12.8 oz (63.866 kg)  BMI 25.75 kg/m2  SpO2 98% .  BMI Body  mass index is 25.75 kg/(m^2).  Wt Readings from Last 3 Encounters:  01/21/16 140 lb 12.8 oz (63.866 kg)  09/05/15 145 lb (65.772 kg)  08/22/15 145 lb 12.8 oz (66.134 kg)    General: Pleasant. Elderly female who is alert and in no acute distress.  HEENT: Normal. Neck: Supple, no JVD, carotid bruits, or masses noted.  Cardiac: Irregular irregular rhythm. Rate ok.. No murmurs, rubs, or gallops. No edema.  Respiratory:  Lungs are clear to auscultation bilaterally with normal work of breathing.  GI: Soft and nontender.  MS: No deformity or atrophy. Gait and ROM intact. Skin: Warm and dry. Color is normal.  Neuro:  Strength and sensation are intact and no gross focal deficits noted.  Psych: Alert, appropriate and with normal affect.   LABORATORY DATA:  EKG:  EKG is not ordered today.  Lab Results  Component Value Date   WBC 9.3 07/12/2014   HGB 14.0 07/12/2014   HCT 43.0 07/12/2014   PLT 266.0 07/12/2014   GLUCOSE 103* 08/20/2015   CHOL 139 08/20/2015   TRIG 78 08/20/2015   HDL 52 08/20/2015   LDLCALC 71 08/20/2015   ALT 12 08/20/2015   AST 19 08/20/2015   NA 141 08/20/2015   K 3.8 08/20/2015   CL 102 08/20/2015   CREATININE 1.02* 08/20/2015   BUN 9 08/20/2015   CO2 29 08/20/2015   TSH 3.83 01/09/2015   INR 3.6 01/21/2016   Lab Results  Component Value Date   INR 3.6 01/21/2016   INR 2.2 12/26/2015   INR 2.0 11/12/2015     BNP (last 3 results) No results for input(s): BNP in the last 8760 hours.  ProBNP (last 3 results) No results for input(s): PROBNP in the last 8760 hours.   Other Studies Reviewed Today:  Myoview Study Highlights from 08/2015     There was no ST segment deviation noted during stress.  This is a low risk study.  There was normal perfusion at rest and with lexiscan stress. No evidence of ischemia or scar. The study was nongated because of atrial fibrillation.    Assessment/Plan: 1. Prior CVA - remains on chronic coumadin  2.  Chronic atrial fibrillation. Doing well. Managed with rate control and anticoagulation.  3. HTN - BP looks great on her current regimen  4. HLD - recheck lab today.   5. Chronic diastolic HF - looks fairly well compensated.   6. Recent GI bug  Current medicines are reviewed with the patient today.  The patient does not have concerns  regarding medicines other than what has been noted above.  The following changes have been made:  See above.  Labs/ tests ordered today include:    Orders Placed This Encounter  Procedures  . Basic metabolic panel  . CBC  . Hepatic function panel  . Lipid panel     Disposition:   FU with Dr. Duke Salvia in 6 months.   Patient is agreeable to this plan and will call if any problems develop in the interim.   Signed: Rosalio Macadamia, RN, ANP-C 01/21/2016 2:00 PM  [facility] [address] [place], [place]  [address] Phone: [phone] Fax: [phone]

## 2016-01-21 NOTE — Patient Instructions (Addendum)
We will be checking the following labs today - BMET, CBC, HPF and Lipids   Medication Instructions:    Continue with your current medicines.     Testing/Procedures To Be Arranged:  N/A  Follow-Up:   See Dr. Duke Salviaandolph in 6 months    Other Special Instructions:   N/A    If you need a refill on your cardiac medications before your next appointment, please call your pharmacy.   Call the [facility] office at [phone] if you have any questions, problems or concerns.

## 2016-01-23 ENCOUNTER — Telehealth: Payer: Self-pay | Admitting: Nurse Practitioner

## 2016-01-23 NOTE — Telephone Encounter (Signed)
Follow Up:    Returning call from Danielle from yesterday.

## 2016-01-23 NOTE — Telephone Encounter (Signed)
Notified the pt of lab results and recommendations per Norma FredricksonLori Gerhardt NP, as mentioned below. Pt verbalized understanding and agrees with this plan.     Notes Recorded by Rosalio MacadamiaLori C Gerhardt, NP on 01/22/2016 at 7:30 AM Ok to report. Labs look ok - would continue on current regimen.

## 2016-02-10 ENCOUNTER — Other Ambulatory Visit: Payer: Self-pay

## 2016-02-10 MED ORDER — WARFARIN SODIUM 5 MG PO TABS: ORAL_TABLET | ORAL | Status: DC

## 2016-02-11 ENCOUNTER — Ambulatory Visit (INDEPENDENT_AMBULATORY_CARE_PROVIDER_SITE_OTHER): Payer: Medicare Other

## 2016-02-11 DIAGNOSIS — Z5181 Encounter for therapeutic drug level monitoring: Secondary | ICD-10-CM | POA: Diagnosis not present

## 2016-02-11 DIAGNOSIS — Z7901 Long term (current) use of anticoagulants: Secondary | ICD-10-CM | POA: Diagnosis not present

## 2016-02-11 DIAGNOSIS — I4891 Unspecified atrial fibrillation: Secondary | ICD-10-CM

## 2016-02-11 DIAGNOSIS — I639 Cerebral infarction, unspecified: Secondary | ICD-10-CM

## 2016-02-11 LAB — POCT INR: INR: 2.9

## 2016-02-26 ENCOUNTER — Other Ambulatory Visit: Payer: Self-pay | Admitting: *Deleted

## 2016-02-26 NOTE — Telephone Encounter (Signed)
Follow up       *STAT* If patient is at the pharmacy, call can be transferred to refill team.   1. Which medications need to be refilled? (please list name of each medication and dose if known) levothyroxine 25mg  2. Which pharmacy/location (including street and city if local pharmacy) is medication to be sent to? [facility] [address] 3. Do they need a 30 day or 90 day supply? 90 day

## 2016-02-26 NOTE — Telephone Encounter (Signed)
Patient requests refill on thyroid medication. She will follow up with Dr Duke Salviaandolph. I routed refill to [facility] refill pool, but it was sent back to me. Patient wanted to see if Dr Duke Salviaandolph or Norma FredricksonLori Gerhardt would refill this for her. She stated that she has an appt with a pcp, but it is not until July. Please advise on request. Thanks, MI

## 2016-02-27 MED ORDER — LEVOTHYROXINE SODIUM 25 MCG PO TABS: 25.0000 ug | ORAL_TABLET | Freq: Every day | ORAL | Status: DC

## 2016-02-27 NOTE — Telephone Encounter (Signed)
Spoke with patient and made her aware that we will refill one time but further refills will need to be authorized by her pcp. Patient verbalized understanding and thanked me for the call.

## 2016-03-09 ENCOUNTER — Other Ambulatory Visit: Payer: Self-pay | Admitting: *Deleted

## 2016-03-09 DIAGNOSIS — E039 Hypothyroidism, unspecified: Secondary | ICD-10-CM

## 2016-03-09 MED ORDER — AMLODIPINE BESYLATE 5 MG PO TABS: 5.0000 mg | ORAL_TABLET | Freq: Every day | ORAL | Status: DC

## 2016-03-09 MED ORDER — POTASSIUM CHLORIDE ER 10 MEQ PO TBCR: 10.0000 meq | EXTENDED_RELEASE_TABLET | Freq: Every day | ORAL | Status: DC

## 2016-03-10 ENCOUNTER — Ambulatory Visit (INDEPENDENT_AMBULATORY_CARE_PROVIDER_SITE_OTHER): Payer: Medicare Other | Admitting: *Deleted

## 2016-03-10 DIAGNOSIS — I639 Cerebral infarction, unspecified: Secondary | ICD-10-CM | POA: Diagnosis not present

## 2016-03-10 DIAGNOSIS — Z5181 Encounter for therapeutic drug level monitoring: Secondary | ICD-10-CM | POA: Diagnosis not present

## 2016-03-10 DIAGNOSIS — Z7901 Long term (current) use of anticoagulants: Secondary | ICD-10-CM | POA: Diagnosis not present

## 2016-03-10 DIAGNOSIS — I4891 Unspecified atrial fibrillation: Secondary | ICD-10-CM

## 2016-03-10 LAB — POCT INR: INR: 1.7

## 2016-03-24 ENCOUNTER — Ambulatory Visit (INDEPENDENT_AMBULATORY_CARE_PROVIDER_SITE_OTHER): Payer: Medicare Other | Admitting: *Deleted

## 2016-03-24 DIAGNOSIS — Z5181 Encounter for therapeutic drug level monitoring: Secondary | ICD-10-CM | POA: Diagnosis not present

## 2016-03-24 DIAGNOSIS — I4891 Unspecified atrial fibrillation: Secondary | ICD-10-CM | POA: Diagnosis not present

## 2016-03-24 DIAGNOSIS — Z7901 Long term (current) use of anticoagulants: Secondary | ICD-10-CM | POA: Diagnosis not present

## 2016-03-24 DIAGNOSIS — I639 Cerebral infarction, unspecified: Secondary | ICD-10-CM

## 2016-03-24 LAB — POCT INR: INR: 2.9

## 2016-04-01 DIAGNOSIS — H40011 Open angle with borderline findings, low risk, right eye: Secondary | ICD-10-CM | POA: Diagnosis not present

## 2016-04-01 DIAGNOSIS — Z0100 Encounter for examination of eyes and vision without abnormal findings: Secondary | ICD-10-CM | POA: Diagnosis not present

## 2016-04-01 DIAGNOSIS — H40012 Open angle with borderline findings, low risk, left eye: Secondary | ICD-10-CM | POA: Diagnosis not present

## 2016-04-01 DIAGNOSIS — H04123 Dry eye syndrome of bilateral lacrimal glands: Secondary | ICD-10-CM | POA: Diagnosis not present

## 2016-04-14 ENCOUNTER — Ambulatory Visit (INDEPENDENT_AMBULATORY_CARE_PROVIDER_SITE_OTHER): Payer: Medicare Other | Admitting: *Deleted

## 2016-04-14 DIAGNOSIS — Z7901 Long term (current) use of anticoagulants: Secondary | ICD-10-CM | POA: Diagnosis not present

## 2016-04-14 DIAGNOSIS — I639 Cerebral infarction, unspecified: Secondary | ICD-10-CM

## 2016-04-14 DIAGNOSIS — I4891 Unspecified atrial fibrillation: Secondary | ICD-10-CM

## 2016-04-14 DIAGNOSIS — Z5181 Encounter for therapeutic drug level monitoring: Secondary | ICD-10-CM | POA: Diagnosis not present

## 2016-04-14 LAB — POCT INR: INR: 2.2

## 2016-04-29 ENCOUNTER — Ambulatory Visit (INDEPENDENT_AMBULATORY_CARE_PROVIDER_SITE_OTHER): Payer: Medicare Other | Admitting: Family Medicine

## 2016-04-29 ENCOUNTER — Encounter: Payer: Self-pay | Admitting: Family Medicine

## 2016-04-29 VITALS — BP 119/63 | HR 80 | Temp 97.9°F | Resp 16 | Ht 62.0 in | Wt 141.4 lb

## 2016-04-29 DIAGNOSIS — R21 Rash and other nonspecific skin eruption: Secondary | ICD-10-CM | POA: Diagnosis not present

## 2016-04-29 DIAGNOSIS — E7800 Pure hypercholesterolemia, unspecified: Secondary | ICD-10-CM | POA: Diagnosis not present

## 2016-04-29 DIAGNOSIS — I639 Cerebral infarction, unspecified: Secondary | ICD-10-CM

## 2016-04-29 DIAGNOSIS — I119 Hypertensive heart disease without heart failure: Secondary | ICD-10-CM

## 2016-04-29 DIAGNOSIS — Z780 Asymptomatic menopausal state: Secondary | ICD-10-CM

## 2016-04-29 DIAGNOSIS — E039 Hypothyroidism, unspecified: Secondary | ICD-10-CM | POA: Diagnosis not present

## 2016-04-29 DIAGNOSIS — Z1231 Encounter for screening mammogram for malignant neoplasm of breast: Secondary | ICD-10-CM

## 2016-04-29 MED ORDER — CLOTRIMAZOLE-BETAMETHASONE 1-0.05 % EX CREA: 1.0000 "application " | TOPICAL_CREAM | Freq: Two times a day (BID) | CUTANEOUS | Status: DC

## 2016-04-29 NOTE — Progress Notes (Signed)
Pre visit review using our clinic review tool, if applicable. No additional management support is needed unless otherwise documented below in the visit note. 

## 2016-04-29 NOTE — Assessment & Plan Note (Signed)
New.  Area appears to be consistent w/ fungal dermatitis.  Start topical Lotrisone cream and if no improvement in 1-2 weeks, plan is to refer to Derm.  Pt expressed understanding and is in agreement w/ plan.

## 2016-04-29 NOTE — Assessment & Plan Note (Signed)
New to provider, ongoing for pt.  Good control today.  Asymptomatic.  Reviewed recent labs.  No med changes.

## 2016-04-29 NOTE — Patient Instructions (Signed)
Please schedule your complete physical in 6 months Please get your thyroid lab done at [facility] (in the lab) if possible We'll call you with your mammogram and bone density appts Use the Lotrisone cream twice daily on the sore areas.  If no improvement in the next 7-10 days, please call me so we can send you to dermatology Call with any questions or concerns Welcome!  We're glad to have you!!!

## 2016-04-29 NOTE — Assessment & Plan Note (Signed)
New to provider, ongoing for pt.  Tolerating statin w/o difficulty.  Recent labs show good control.  No need to repeat today.  Will continue to follow.

## 2016-04-29 NOTE — Progress Notes (Signed)
   Subjective:    Patient ID: [name], female    DOB: 1930-08-08, 82 y.o.   MRN: [id number]  HPI New to establish.  Previous MD- Brackbill  HTN- chronic problem, on Amlodipine, Lasix, Nadolol.  Denies CP, SOB, HAs, visual changes, edema.  Hyperlipidemia- chronic problem, on Lipitor.  Most recent labs show total cholesterol of 133, LDL 73, HDL 42.  Denies abd pain, N/V.  Afib- paroxysmal Afib.  On Nadolol.  [facility].  Sees Norma FredricksonLori Gerhardt, NP.  Denies palpitations, SOB.  Hypothyroid- chronic problem, on Levothyroxine.  Has not had recent lab work.  Denies fatigue, changes to skin/hair/nails.  Skin lesions- pt reports 'crusty' areas between thighs and in her buttock.  Area is very sore.  Pt has applied vasoline and diaper rash cream w/o relief.  Health Maintenance- due for DEXA, mammo ([facility]).  Had colonoscopy w/ Dr Kinnie ScalesMedoff  Review of Systems For ROS see HPI     Objective:   Physical Exam  Constitutional: She is oriented to person, place, and time. She appears well-developed and well-nourished. No distress.  HENT:  Head: Normocephalic and atraumatic.  Eyes: Conjunctivae and EOM are normal. Pupils are equal, round, and reactive to light.  Neck: Normal range of motion. Neck supple. No thyromegaly present.  Cardiovascular: Normal rate, normal heart sounds and intact distal pulses.   No murmur heard. Irregular S1/S2  Pulmonary/Chest: Effort normal and breath sounds normal. No respiratory distress.  Abdominal: Soft. She exhibits no distension. There is no tenderness.  Musculoskeletal: She exhibits no edema.  Lymphadenopathy:    She has no cervical adenopathy.  Neurological: She is alert and oriented to person, place, and time.  Skin: Skin is warm and dry.  Bilateral inner thigh 'kissing' lesions w/ erythema, central blister formation, TTP  Psychiatric: She has a normal mood and affect. Her behavior is normal.  Vitals reviewed.           Assessment & Plan:

## 2016-04-29 NOTE — Assessment & Plan Note (Signed)
New to provider, ongoing for pt.  Currently asymptomatic.  Overdue for labs- pt will try and get this done at her next Coumadin check.  Will adjust meds prn.

## 2016-05-12 ENCOUNTER — Telehealth: Payer: Self-pay | Admitting: *Deleted

## 2016-05-12 ENCOUNTER — Other Ambulatory Visit: Payer: Self-pay | Admitting: *Deleted

## 2016-05-12 ENCOUNTER — Other Ambulatory Visit (INDEPENDENT_AMBULATORY_CARE_PROVIDER_SITE_OTHER): Payer: Medicare Other

## 2016-05-12 ENCOUNTER — Ambulatory Visit (INDEPENDENT_AMBULATORY_CARE_PROVIDER_SITE_OTHER): Payer: Medicare Other | Admitting: *Deleted

## 2016-05-12 DIAGNOSIS — E039 Hypothyroidism, unspecified: Secondary | ICD-10-CM

## 2016-05-12 DIAGNOSIS — I639 Cerebral infarction, unspecified: Secondary | ICD-10-CM | POA: Diagnosis not present

## 2016-05-12 DIAGNOSIS — Z5181 Encounter for therapeutic drug level monitoring: Secondary | ICD-10-CM

## 2016-05-12 DIAGNOSIS — Z7901 Long term (current) use of anticoagulants: Secondary | ICD-10-CM | POA: Diagnosis not present

## 2016-05-12 DIAGNOSIS — I4891 Unspecified atrial fibrillation: Secondary | ICD-10-CM | POA: Diagnosis not present

## 2016-05-12 LAB — POCT INR: INR: 2.5

## 2016-05-12 LAB — TSH: TSH: 1.33 mIU/L

## 2016-05-12 NOTE — Telephone Encounter (Signed)
Pt came in today to get coumadin checked stated was suppose to get TSH checked at Dr.Tabori's office and no one from lab was in so pt requested lab today.  Per [name], NP, TSH drawn today was ok. Order put in and linked.

## 2016-05-12 NOTE — Addendum Note (Signed)
Addended by: Drue Dun on: 05/12/2016 02:28 PM   Modules accepted: Orders

## 2016-05-19 DIAGNOSIS — Z1231 Encounter for screening mammogram for malignant neoplasm of breast: Secondary | ICD-10-CM | POA: Diagnosis not present

## 2016-05-19 DIAGNOSIS — M810 Age-related osteoporosis without current pathological fracture: Secondary | ICD-10-CM | POA: Diagnosis not present

## 2016-05-19 LAB — HM MAMMOGRAPHY

## 2016-05-19 LAB — HM DEXA SCAN

## 2016-05-25 ENCOUNTER — Encounter: Payer: Self-pay | Admitting: General Practice

## 2016-05-27 ENCOUNTER — Telehealth: Payer: Self-pay | Admitting: Emergency Medicine

## 2016-05-27 DIAGNOSIS — M810 Age-related osteoporosis without current pathological fracture: Secondary | ICD-10-CM | POA: Insufficient documentation

## 2016-05-27 MED ORDER — ALENDRONATE SODIUM 70 MG PO TABS: 70.0000 mg | ORAL_TABLET | ORAL | 11 refills | Status: DC

## 2016-05-27 NOTE — Telephone Encounter (Signed)
Received bone density results from [facility]. Her bone density shows Osteoporosis. Per Dr. Beverely Lowabori, patient can start Fosamax 70 mg weekly or Prolia injection twice a year.  Patient is agreeable with starting Fosamax 70 mg weekly. [facility] to the [facility] [address]

## 2016-06-09 ENCOUNTER — Ambulatory Visit (INDEPENDENT_AMBULATORY_CARE_PROVIDER_SITE_OTHER): Payer: Medicare Other | Admitting: Pharmacist

## 2016-06-09 ENCOUNTER — Other Ambulatory Visit: Payer: Self-pay | Admitting: Nurse Practitioner

## 2016-06-09 DIAGNOSIS — Z5181 Encounter for therapeutic drug level monitoring: Secondary | ICD-10-CM

## 2016-06-09 DIAGNOSIS — I4891 Unspecified atrial fibrillation: Secondary | ICD-10-CM | POA: Diagnosis not present

## 2016-06-09 DIAGNOSIS — Z7901 Long term (current) use of anticoagulants: Secondary | ICD-10-CM

## 2016-06-09 DIAGNOSIS — I639 Cerebral infarction, unspecified: Secondary | ICD-10-CM

## 2016-06-09 LAB — POCT INR: INR: 3.1

## 2016-06-24 ENCOUNTER — Other Ambulatory Visit: Payer: Self-pay | Admitting: Cardiovascular Disease

## 2016-06-24 NOTE — Telephone Encounter (Signed)
Review for refill. 

## 2016-06-25 ENCOUNTER — Other Ambulatory Visit: Payer: Self-pay | Admitting: *Deleted

## 2016-06-25 MED ORDER — NADOLOL 20 MG PO TABS: 10.0000 mg | ORAL_TABLET | Freq: Every day | ORAL | 1 refills | Status: DC

## 2016-07-07 ENCOUNTER — Ambulatory Visit (INDEPENDENT_AMBULATORY_CARE_PROVIDER_SITE_OTHER): Payer: Medicare Other | Admitting: *Deleted

## 2016-07-07 DIAGNOSIS — Z5181 Encounter for therapeutic drug level monitoring: Secondary | ICD-10-CM

## 2016-07-07 DIAGNOSIS — I639 Cerebral infarction, unspecified: Secondary | ICD-10-CM | POA: Diagnosis not present

## 2016-07-07 DIAGNOSIS — I4891 Unspecified atrial fibrillation: Secondary | ICD-10-CM | POA: Diagnosis not present

## 2016-07-07 DIAGNOSIS — Z7901 Long term (current) use of anticoagulants: Secondary | ICD-10-CM

## 2016-07-07 LAB — POCT INR: INR: 2.4

## 2016-07-16 ENCOUNTER — Ambulatory Visit (INDEPENDENT_AMBULATORY_CARE_PROVIDER_SITE_OTHER): Payer: Medicare Other | Admitting: Family Medicine

## 2016-07-16 ENCOUNTER — Encounter: Payer: Self-pay | Admitting: Family Medicine

## 2016-07-16 VITALS — BP 110/82 | HR 83 | Temp 97.7°F | Ht 62.0 in | Wt 141.2 lb

## 2016-07-16 DIAGNOSIS — I639 Cerebral infarction, unspecified: Secondary | ICD-10-CM

## 2016-07-16 DIAGNOSIS — M545 Low back pain, unspecified: Secondary | ICD-10-CM

## 2016-07-16 MED ORDER — TIZANIDINE HCL 2 MG PO TABS: 2.0000 mg | ORAL_TABLET | Freq: Three times a day (TID) | ORAL | 0 refills | Status: DC | PRN

## 2016-07-16 NOTE — Progress Notes (Signed)
Pre visit review using our clinic review tool, if applicable. No additional management support is needed unless otherwise documented below in the visit note. 

## 2016-07-16 NOTE — Patient Instructions (Signed)
BEFORE YOU LEAVE: -follow up: in 2 weeks, sooner if needed -low back exercises  Heat for 15 minutes twice daily.  Tiger balm or other menthol sports cream as needed per instructions for pain.  Muscle relaxer per instructions (sent to pharmacy)  Tylenol 500-1000mg  per instructions as needed for pain (not more then 3 times daily.)  Do the exercises 4 days per week.  Seek care with your doctor sooner if worsening or if new symptoms or concerns.

## 2016-07-16 NOTE — Progress Notes (Signed)
HPI:  [name] is a pleasant 82 year old here for an acute visit for low back pain. -Started about 2 weeks ago -She can't think of a specific injury or inciting event -Pain is located in the bilateral lower back in the muscles, right greater than left -Pain is constant, but more severe with certain movements such as bending or twisting -Tylenol did help some with the pain -Denies: Radiation of pain to buttocks or legs, fevers, malaise, urinary symptoms, bowel problems, weakness, numbness or bowel or bladder incontinence -She cannot take anti-inflammatory medications and prefers to stay away from narcotic pain medications -She rarely takes the Valium that is listed on her medications   ROS: See pertinent positives and negatives per HPI.  Past Medical History:  Diagnosis Date  . Atrial fibrillation (HCC)   . Chronic anticoagulation   . Chronic anticoagulation   . CVA (cerebrovascular accident) (HCC)   . Diabetes mellitus   . History of chicken pox   . Hyperlipidemia   . Hypertension     Past Surgical History:  Procedure Laterality Date  . APPENDECTOMY    . CARDIAC CATHETERIZATION  11/09/91   EF 70%  . DILATION AND CURETTAGE OF UTERUS      Family History  Problem Relation Age of Onset  . Heart disease Mother   . Hypertension Mother   . Stroke Mother   . Heart disease Father   . Heart failure Brother     Social History   Social History  . Marital status: Married    Spouse name: N/A  . Number of children: N/A  . Years of education: N/A   Social History Main Topics  . Smoking status: Former Smoker    Quit date: 10/19/1998  . Smokeless tobacco: None  . Alcohol use No  . Drug use: No  . Sexual activity: Not Asked   Other Topics Concern  . None   Social History Narrative  . None     Current Outpatient Prescriptions:  .  alendronate (FOSAMAX) 70 MG tablet, Take 1 tablet (70 mg total) by mouth every 7 (seven) days. Take with a full glass of  water on an empty stomach., Disp: 4 tablet, Rfl: 11 .  amLODipine (NORVASC) 5 MG tablet, Take 1 tablet (5 mg total) by mouth daily., Disp: 90 tablet, Rfl: 3 .  atorvastatin (LIPITOR) 20 MG tablet, Take 0.5 tablets (10 mg total) by mouth every morning., Disp: 15 tablet, Rfl: 11 .  Calcium Carbonate-Vitamin D (CALCIUM 600 + D PO), Take 1 tablet by mouth daily. , Disp: , Rfl:  .  clotrimazole-betamethasone (LOTRISONE) cream, Apply 1 application topically 2 (two) times daily., Disp: 45 g, Rfl: 1 .  diazepam (VALIUM) 2 MG tablet, Take 1 tablet (2 mg total) by mouth 2 (two) times daily as needed for anxiety., Disp: 180 tablet, Rfl: 0 .  furosemide (LASIX) 20 MG tablet, Take 10 mg by mouth as needed for fluid or edema., Disp: , Rfl:  .  levothyroxine (SYNTHROID, LEVOTHROID) 25 MCG tablet, TAKE ONE TABLET BY MOUTH ONCE DAILY BEFORE  BREAKFAST, Disp: 90 tablet, Rfl: 0 .  mirabegron ER (MYRBETRIQ) 25 MG TB24 tablet, Take 25 mg by mouth daily. Reported on 04/29/2016, Disp: , Rfl:  .  nadolol (CORGARD) 20 MG tablet, Take 0.5 tablets (10 mg total) by mouth daily., Disp: 45 tablet, Rfl: 1 .  nitroGLYCERIN (NITROSTAT) 0.4 MG SL tablet, Place 0.4 mg under the tongue every 5 (five) minutes as needed (chest pain). ,  Disp: , Rfl:  .  potassium chloride (K-DUR) 10 MEQ tablet, Take 1 tablet (10 mEq total) by mouth daily., Disp: 90 tablet, Rfl: 3 .  warfarin (COUMADIN) 5 MG tablet, TAKE TABLET BY MOUTH AS DIRECTED BY COUMADIN CLINIC, Disp: 90 tablet, Rfl: 1 .  tiZANidine (ZANAFLEX) 2 MG tablet, Take 1 tablet (2 mg total) by mouth every 8 (eight) hours as needed for muscle spasms., Disp: 30 tablet, Rfl: 0  EXAM:  Vitals:   07/16/16 1357  BP: 110/82  Pulse: 83  Temp: 97.7 F (36.5 C)    Body mass index is 25.83 kg/m.  GENERAL: vitals reviewed and listed above, alert, oriented, appears well hydrated and in no acute distress  HEENT: atraumatic, conjunttiva clear, no obvious abnormalities on inspection of external  nose and ears  NECK: no obvious masses on inspection  LUNGS: clear to auscultation bilaterally, no wheezes, rales or rhonchi, good air movement  CV: HRRR, no peripheral edema  MS: moves all extremities without noticeable abnormality. Normal Gait Normal inspection of back, no obvious scoliosis or leg length descrepancy No bony TTP Soft tissue TTP at: Bilateral lower lumbar paraspinal muscles R> L -/+ tests: neg trendelenburg,+ R facet loading, -SLRT, -CLRT, -FABER, -FADIR Normal muscle strength, sensation to light touch and DTRs in LEs bilaterally  PSYCH: pleasant and cooperative, no obvious depression or anxiety  ASSESSMENT AND PLAN:  Discussed the following assessment and plan:  Right-sided low back pain without sciatica  -we discussed possible serious and likely etiologies, workup and treatment, treatment risks and return precautions - Musculoskeletal etiology most likely, muscle strain versus degenerative disc disease, likely some facet arthropathy - no alarm features or neurological deficits on exam -after this discussion, Eber JonesCarolyn opted for heat, Tylenol, topical analgesics, some exercises in the muscle relaxer after discussion of risks of the various treatments. Did advise not to use the muscle relaxer with the Valium. -follow up advised with PCP in 2 weeks -of course, we advised Eber JonesCarolyn  to return or notify a doctor immediately if symptoms worsen or persist or new concerns arise.    Patient Instructions  BEFORE YOU LEAVE: -follow up: in 2 weeks, sooner if needed -low back exercises  Heat for 15 minutes twice daily.  Tiger balm or other menthol sports cream as needed per instructions for pain.  Muscle relaxer per instructions (sent to pharmacy)  Tylenol 500-1000mg  per instructions as needed for pain (not more then 3 times daily.)  Do the exercises 4 days per week.  Seek care with your doctor sooner if worsening or if new symptoms or concerns.    Kriste BasqueKIM, Yenesis Even R.,  DO

## 2016-07-21 ENCOUNTER — Telehealth: Payer: Self-pay | Admitting: Family Medicine

## 2016-07-21 NOTE — Telephone Encounter (Signed)
Patient states she was seen by Dr. Selena BattenKim last week for back pain.  She states she is not feeling much better and following Dr. Elmyra RicksKim's instructions.  Patient states she wants to know what Dr. Beverely Lowabori recommends she do in the mean time.  Patient states she has tried salontas Lidocaine patch and doesn't seem to help.  She is also taking muscle relaxants and otc pain meds.

## 2016-07-22 ENCOUNTER — Telehealth: Payer: Self-pay | Admitting: Family Medicine

## 2016-07-22 MED ORDER — PREDNISONE 10 MG PO TABS: ORAL_TABLET | ORAL | 0 refills | Status: DC

## 2016-07-22 NOTE — Telephone Encounter (Signed)
Very unlikely to be the cause

## 2016-07-22 NOTE — Telephone Encounter (Signed)
Spoke with pt and advised of PCP recommendations. Med filled to pharmacy.

## 2016-07-22 NOTE — Telephone Encounter (Signed)
Pt states that she had a bone density test back in Aug and starting taking alendronate and asking if this meds is causing her back pain and would like a call back.

## 2016-07-22 NOTE — Telephone Encounter (Signed)
Pt informed and stated an understanding.  

## 2016-07-22 NOTE — Telephone Encounter (Signed)
If no improvement in pain w/ muscle relaxers and OTC NSAIDs, we can do a pred taper (10mg  3 x3, 2 x3, 1 x3, #18).  If no improvement after this, will need referral and/or imaging to assess.

## 2016-07-27 ENCOUNTER — Telehealth: Payer: Self-pay | Admitting: Family Medicine

## 2016-07-27 DIAGNOSIS — M545 Low back pain, unspecified: Secondary | ICD-10-CM

## 2016-07-27 MED ORDER — TIZANIDINE HCL 2 MG PO TABS: 2.0000 mg | ORAL_TABLET | Freq: Three times a day (TID) | ORAL | 0 refills | Status: DC | PRN

## 2016-07-27 NOTE — Telephone Encounter (Signed)
Ok for #30, no refills 

## 2016-07-27 NOTE — Telephone Encounter (Signed)
The next step would be PT or ortho referral for LBP

## 2016-07-27 NOTE — Telephone Encounter (Signed)
Spoke with patient, she would like to see ortho. Dr. Eulah PontMurphy has treated her husband in the past. Referral was placed however pt is wondering if you can refill her muscle relaxer?   Tizanidine last filled 07/16/16 #30 with 0 (Dr. Selena BattenKim)

## 2016-07-27 NOTE — Telephone Encounter (Signed)
This medication was filled to pt's local pharmacy.

## 2016-07-27 NOTE — Telephone Encounter (Signed)
Pt states that she is still in a lot of pain with her back, she states that the muscle relaxer works for a bit then wears off. Pt is asking what the next step would be.

## 2016-07-27 NOTE — Telephone Encounter (Signed)
Patient advised the muscle relaxer was sent to the pharmacy and referral made for the ortho.

## 2016-07-31 DIAGNOSIS — M545 Low back pain: Secondary | ICD-10-CM | POA: Diagnosis not present

## 2016-07-31 DIAGNOSIS — M546 Pain in thoracic spine: Secondary | ICD-10-CM | POA: Diagnosis not present

## 2016-08-03 ENCOUNTER — Other Ambulatory Visit: Payer: Self-pay | Admitting: Family Medicine

## 2016-08-03 NOTE — Telephone Encounter (Signed)
Last OV 07/16/16 Tizanidine last filled 07/27/16 #30 with 0

## 2016-08-03 NOTE — Telephone Encounter (Signed)
Medication filled to pharmacy as requested.   

## 2016-08-04 ENCOUNTER — Ambulatory Visit (INDEPENDENT_AMBULATORY_CARE_PROVIDER_SITE_OTHER): Payer: Medicare Other | Admitting: *Deleted

## 2016-08-04 DIAGNOSIS — Z5181 Encounter for therapeutic drug level monitoring: Secondary | ICD-10-CM

## 2016-08-04 DIAGNOSIS — I4891 Unspecified atrial fibrillation: Secondary | ICD-10-CM | POA: Diagnosis not present

## 2016-08-04 DIAGNOSIS — Z7901 Long term (current) use of anticoagulants: Secondary | ICD-10-CM | POA: Diagnosis not present

## 2016-08-04 DIAGNOSIS — I639 Cerebral infarction, unspecified: Secondary | ICD-10-CM | POA: Diagnosis not present

## 2016-08-04 LAB — POCT INR: INR: 4.8

## 2016-08-06 ENCOUNTER — Telehealth: Payer: Self-pay | Admitting: Emergency Medicine

## 2016-08-06 NOTE — Telephone Encounter (Signed)
Advised patient the Tizanidine was ref[facility] to [facility] on 08/03/16 #30. She hasn't checked but will.

## 2016-08-06 NOTE — Telephone Encounter (Signed)
Medication just filled on 10/16

## 2016-08-06 NOTE — Telephone Encounter (Signed)
Patient calling requesting refill of the Tizanidine 2 mg. Patient states she is taking every 8 hrs as needed. She did see the Orthopaedic Dr Farris HasKramer and she has a MRI scheduled. He wanted her to stay on the Tizanidine. Please advise. Patient would like a call back at home or on her husband Iantha FallenKenneth cell [phone].

## 2016-08-11 ENCOUNTER — Ambulatory Visit (INDEPENDENT_AMBULATORY_CARE_PROVIDER_SITE_OTHER): Payer: Medicare Other | Admitting: *Deleted

## 2016-08-11 DIAGNOSIS — I639 Cerebral infarction, unspecified: Secondary | ICD-10-CM

## 2016-08-11 DIAGNOSIS — I4891 Unspecified atrial fibrillation: Secondary | ICD-10-CM

## 2016-08-11 DIAGNOSIS — Z5181 Encounter for therapeutic drug level monitoring: Secondary | ICD-10-CM

## 2016-08-11 DIAGNOSIS — Z7901 Long term (current) use of anticoagulants: Secondary | ICD-10-CM | POA: Diagnosis not present

## 2016-08-11 DIAGNOSIS — M546 Pain in thoracic spine: Secondary | ICD-10-CM | POA: Diagnosis not present

## 2016-08-11 DIAGNOSIS — M545 Low back pain: Secondary | ICD-10-CM | POA: Diagnosis not present

## 2016-08-11 LAB — POCT INR: INR: 2.9

## 2016-08-13 DIAGNOSIS — M545 Low back pain: Secondary | ICD-10-CM | POA: Diagnosis not present

## 2016-08-17 ENCOUNTER — Other Ambulatory Visit: Payer: Self-pay | Admitting: Sports Medicine

## 2016-08-17 DIAGNOSIS — M4854XA Collapsed vertebra, not elsewhere classified, thoracic region, initial encounter for fracture: Secondary | ICD-10-CM

## 2016-08-18 ENCOUNTER — Telehealth: Payer: Self-pay | Admitting: Family Medicine

## 2016-08-18 ENCOUNTER — Telehealth: Payer: Self-pay | Admitting: *Deleted

## 2016-08-18 ENCOUNTER — Telehealth: Payer: Self-pay | Admitting: Pharmacist Clinician (PhC)/ Clinical Pharmacy Specialist

## 2016-08-18 DIAGNOSIS — Z01818 Encounter for other preprocedural examination: Secondary | ICD-10-CM

## 2016-08-18 NOTE — Telephone Encounter (Signed)
Ok for her to have CBC done at [facility] prior to her vertebraplasty

## 2016-08-18 NOTE — Telephone Encounter (Signed)
Patient needs to have vertebroplasty, date not yet scheduled.  Because of her history of stroke, she will need to have enoxaparin bridging when she holds her warfarin.   Spoke with patient, she cannot recall having done enoxaparin in the past.  Will have RN review with her at next INR check.  [facility]

## 2016-08-18 NOTE — Telephone Encounter (Signed)
Spoke with pt she advised that her imaging appt is Thursday of next week with [facility] [address]. Pt is going to be undergoing a vertebroplasty.   If ok please fax to [facility] at [phone] or [phone].

## 2016-08-18 NOTE — Telephone Encounter (Signed)
S/w pt at length, very confused and redundant.  Try to tell  pt would discuss coumadin and what to do for upcoming procedure at coumadin appointment. Pt needs to be bridge due to prior stroke and being on coumadin for upcoming procedure. Pt stated needed a CBC but called Dr.Tabori's office this am to schedule CBC.   Also scheduled pt to see Dr. Von Ormy, [facility]. [facility]'s plan of action for upcoming [facility] @  [phone].

## 2016-08-18 NOTE — Telephone Encounter (Signed)
[facility] to have a CBC done before they will do a procedure on her back. Pt states that she will be going to the coumadin clinic on Tues and  asking if an order can be placed to have this done all at the same time.

## 2016-08-19 NOTE — Addendum Note (Signed)
Addended by: Geannie RisenBRODMERKEL, Alzora Ha L on: 08/19/2016 08:56 AM   Modules accepted: Orders

## 2016-08-19 NOTE — Telephone Encounter (Signed)
Orders placed and pt made aware ?

## 2016-08-21 ENCOUNTER — Other Ambulatory Visit: Payer: Self-pay | Admitting: Family Medicine

## 2016-08-21 NOTE — Telephone Encounter (Signed)
Last OV 04/29/16 Tizanidine last filled 08/03/16 #30 with 0   Pharmacy is requesting a 90 day supply, please advise?

## 2016-08-25 ENCOUNTER — Ambulatory Visit (INDEPENDENT_AMBULATORY_CARE_PROVIDER_SITE_OTHER): Payer: Medicare Other | Admitting: *Deleted

## 2016-08-25 ENCOUNTER — Other Ambulatory Visit: Payer: Medicare Other

## 2016-08-25 ENCOUNTER — Telehealth: Payer: Self-pay | Admitting: *Deleted

## 2016-08-25 DIAGNOSIS — I639 Cerebral infarction, unspecified: Secondary | ICD-10-CM | POA: Diagnosis not present

## 2016-08-25 DIAGNOSIS — Z7901 Long term (current) use of anticoagulants: Secondary | ICD-10-CM | POA: Diagnosis not present

## 2016-08-25 DIAGNOSIS — I4891 Unspecified atrial fibrillation: Secondary | ICD-10-CM | POA: Diagnosis not present

## 2016-08-25 DIAGNOSIS — Z5181 Encounter for therapeutic drug level monitoring: Secondary | ICD-10-CM | POA: Diagnosis not present

## 2016-08-25 LAB — CBC WITH DIFFERENTIAL/PLATELET
Basophils Absolute: 0 cells/uL (ref 0–200)
Basophils Relative: 0 %
Eosinophils Absolute: 156 cells/uL (ref 15–500)
Eosinophils Relative: 2 %
HCT: 47.9 % — ABNORMAL HIGH (ref 35.0–45.0)
Hemoglobin: 16.2 g/dL — ABNORMAL HIGH (ref 11.7–15.5)
Lymphocytes Relative: 19 %
Lymphs Abs: 1482 cells/uL (ref 850–3900)
MCH: 32.3 pg (ref 27.0–33.0)
MCHC: 33.8 g/dL (ref 32.0–36.0)
MCV: 95.6 fL (ref 80.0–100.0)
MPV: 11.3 fL (ref 7.5–12.5)
Monocytes Absolute: 780 cells/uL (ref 200–950)
Monocytes Relative: 10 %
Neutro Abs: 5382 cells/uL (ref 1500–7800)
Neutrophils Relative %: 69 %
Platelets: 228 10*3/uL (ref 140–400)
RBC: 5.01 MIL/uL (ref 3.80–5.10)
RDW: 14.2 % (ref 11.0–15.0)
WBC: 7.8 10*3/uL (ref 3.8–10.8)

## 2016-08-25 LAB — POCT INR: INR: 2.4

## 2016-08-25 NOTE — Telephone Encounter (Signed)
I see it ordered at Dr. Rennis Goldenabori's office. I was not asked by [facility] for this - only to address her anticoagulation - which she needs bridging with Lovenox.

## 2016-08-25 NOTE — Telephone Encounter (Signed)
Early CharsAleica Carter from registration came and asked if this pt could get a CBC drawn here for upcoming procedure.  Spoke at length with this pt on phone and stated pt s/w Dr.Tabori's office about getting a CBC drawn at Dr.Tabori's office. Lawson FiscalLori stated at recent phone conversation pt could not get CBC drawn here.  Pt is very confused.  Aleica is going to call Dr. Rennis Goldenabori's office. [place].

## 2016-08-27 ENCOUNTER — Other Ambulatory Visit: Payer: Self-pay | Admitting: Sports Medicine

## 2016-08-27 ENCOUNTER — Ambulatory Visit
Admission: RE | Admit: 2016-08-27 | Discharge: 2016-08-27 | Disposition: A | Discharge status: Home / Self Care | Payer: Medicare Other | Source: Ambulatory Visit | Attending: Sports Medicine | Admitting: Sports Medicine

## 2016-08-27 ENCOUNTER — Ambulatory Visit
Admission: RE | Admit: 2016-08-27 | Discharge: 2016-08-27 | Disposition: A | Discharge status: Home / Self Care | Payer: Self-pay | Source: Ambulatory Visit | Attending: Sports Medicine | Admitting: Sports Medicine

## 2016-08-27 ENCOUNTER — Ambulatory Visit (INDEPENDENT_AMBULATORY_CARE_PROVIDER_SITE_OTHER): Payer: Medicare Other | Admitting: *Deleted

## 2016-08-27 DIAGNOSIS — I639 Cerebral infarction, unspecified: Secondary | ICD-10-CM | POA: Diagnosis not present

## 2016-08-27 DIAGNOSIS — R52 Pain, unspecified: Secondary | ICD-10-CM

## 2016-08-27 DIAGNOSIS — Z7901 Long term (current) use of anticoagulants: Secondary | ICD-10-CM | POA: Diagnosis not present

## 2016-08-27 DIAGNOSIS — I4891 Unspecified atrial fibrillation: Secondary | ICD-10-CM

## 2016-08-27 DIAGNOSIS — M4854XA Collapsed vertebra, not elsewhere classified, thoracic region, initial encounter for fracture: Secondary | ICD-10-CM

## 2016-08-27 DIAGNOSIS — S22010G Wedge compression fracture of first thoracic vertebra, subsequent encounter for fracture with delayed healing: Secondary | ICD-10-CM | POA: Diagnosis not present

## 2016-08-27 DIAGNOSIS — Z5181 Encounter for therapeutic drug level monitoring: Secondary | ICD-10-CM | POA: Diagnosis not present

## 2016-08-27 LAB — POCT INR: INR: 2.3

## 2016-08-27 MED ORDER — ENOXAPARIN SODIUM 60 MG/0.6ML ~~LOC~~ SOLN: 60.0000 mg | Freq: Two times a day (BID) | SUBCUTANEOUS | 1 refills | Status: DC

## 2016-08-27 NOTE — Patient Instructions (Addendum)
08/26/2016 last day to take coumadin  08/27/2016 no coumadin no lovenox  08/28/2016 no coumadin Lovenox 60 mg 8am and then 12 hours later take Lovenox 60 mg 8pm 08/29/2016 no coumadin Lovenox 60 mg 8am and then 12 hours later take Lovenox 60 mg 8pm 08/30/2016 no coumadin Lovenox 60 mg 8am and then 12 hours later take Lovenox 60 mg 8pm 08/31/2016 no coumadin Lovenox 60 mg 8am only  09/01/2016 no coumadin no Lovenox day of procedure then after procedure ask MD when to restart coumadin and Lovenox and when instructed to restart restart at same dose coumadin 2.5mg  daily except coumadin 5mg  on Mondays Wednesdays and Friday and restart Lovenox 60 mg and take at 8am and 8pm each day and take coumadin and Lovenox until seen in coumadin clinic on Monday 20th

## 2016-08-27 NOTE — Consult Note (Signed)
Chief Complaint: Patient was seen in consultation today for a T11 osteoporotic compression fracture  at the request of Kramer,James  Referring Physician(s): Kramer,James  History of Present Illness: [name] is a 82 y.o. female who complains of low thoracic back pain beginning an 2 months ago.  She is initially unable get out of bed without significant assistance.  She was first seen for this 07/31/2016.  She was treated with Toradol and steroid intramuscular.  Further workup revealed a T11 osteoporotic compression fracture with minimal retropulsion of bone.  She has subsequently been treated with opiod medications which have provided some relief.  With her pain medication, she continues to have 6/10 pain.  There has been no significant relief of her pain otherwise.  A Roland Morris disability questionnaire was performed.  She scored 21/24.  She was previously very active.  She is now unable to participate in most activities of daily living.  She has decreased appetite and some weight loss.      She complains of pain in the right low back which extends both to the hips into the right-sided ribs.  She is decreased mobility, but is not using any assistive device such as a cane or walker.   Past Medical History:  Diagnosis Date  . Atrial fibrillation (HCC)   . Chronic anticoagulation   . Chronic anticoagulation   . CVA (cerebrovascular accident) (HCC)   . Diabetes mellitus   . History of chicken pox   . Hyperlipidemia   . Hypertension     Past Surgical History:  Procedure Laterality Date  . APPENDECTOMY    . CARDIAC CATHETERIZATION  11/09/91   EF 70%  . DILATION AND CURETTAGE OF UTERUS      Allergies: Cozaar; Hydralazine; Hyzaar [losartan potassium-hctz]; Lisinopril; Losartan potassium-hctz; and Sulfa drugs cross reactors  Medications: Prior to Admission medications   Medication Sig Start Date End Date Taking? Authorizing Provider  alendronate (FOSAMAX) 70 MG  tablet Take 1 tablet (70 mg total) by mouth every 7 (seven) days. Take with a full glass of water on an empty stomach. 05/27/16   Sheliah Hatch, MD  amLODipine (NORVASC) 5 MG tablet Take 1 tablet (5 mg total) by mouth daily. 03/09/16   Rosalio Macadamia, NP  atorvastatin (LIPITOR) 20 MG tablet Take 0.5 tablets (10 mg total) by mouth every morning. 09/16/15   Cassell Clement, MD  Calcium Carbonate-Vitamin D (CALCIUM 600 + D PO) Take 1 tablet by mouth daily.     Historical Provider, MD  clotrimazole-betamethasone (LOTRISONE) cream Apply 1 application topically 2 (two) times daily. 04/29/16   Sheliah Hatch, MD  diazepam (VALIUM) 2 MG tablet Take 1 tablet (2 mg total) by mouth 2 (two) times daily as needed for anxiety. 11/21/15   Cassell Clement, MD  enoxaparin (LOVENOX) 60 MG/0.6ML injection Inject 0.6 mLs (60 mg total) into the skin every 12 (twelve) hours. 08/27/16   Chilton Si, MD  furosemide (LASIX) 20 MG tablet Take 10 mg by mouth as needed for fluid or edema.    Historical Provider, MD  levothyroxine (SYNTHROID, LEVOTHROID) 25 MCG tablet TAKE ONE TABLET BY MOUTH ONCE DAILY BEFORE  BREAKFAST 06/10/16   Rosalio Macadamia, NP  mirabegron ER (MYRBETRIQ) 25 MG TB24 tablet Take 25 mg by mouth daily. Reported on 04/29/2016    Historical Provider, MD  nadolol (CORGARD) 20 MG tablet Take 0.5 tablets (10 mg total) by mouth daily. 06/25/16   Chilton Si, MD  nitroGLYCERIN (  NITROSTAT) 0.4 MG SL tablet Place 0.4 mg under the tongue every 5 (five) minutes as needed (chest pain).     Historical Provider, MD  potassium chloride (K-DUR) 10 MEQ tablet Take 1 tablet (10 mEq total) by mouth daily. 03/09/16   Rosalio MacadamiaLori C Gerhardt, NP  tiZANidine (ZANAFLEX) 2 MG tablet TAKE ONE TABLET BY MOUTH EVERY 8 HOURS AS NEEDED FOR MUSCLE SPASM. 08/21/16   Sheliah HatchKatherine E Tabori, MD  traMADol (ULTRAM) 50 MG tablet Take 50 mg by mouth. Take every 6 to 8 hours as needed for pain    Historical Provider, MD  warfarin (COUMADIN) 5 MG  tablet TAKE TABLET BY MOUTH AS DIRECTED BY COUMADIN CLINIC 06/24/16   Chilton Siiffany [place], MD     Family History  Problem Relation Age of Onset  . Heart disease Mother   . Hypertension Mother   . Stroke Mother   . Heart disease Father   . Heart failure Brother     Social History   Social History  . Marital status: Married    Spouse name: N/A  . Number of children: N/A  . Years of education: N/A   Social History Main Topics  . Smoking status: Former Smoker    Quit date: 10/19/1998  . Smokeless tobacco: Not on file  . Alcohol use No  . Drug use: No  . Sexual activity: Not on file   Other Topics Concern  . Not on file   Social History Narrative  . No narrative on file    ECOG Status:   Review of Systems: A 12 point ROS discussed and pertinent positives are indicated in the HPI above.  All other systems are negative.  Review of Systems  Cardiovascular: Positive for leg swelling.  All other systems reviewed and are negative.   Vital Signs: BP 112/64   Pulse 90   Temp 98.3 F (36.8 C) (Oral)   SpO2 97%   Physical Exam  Constitutional: She appears well-developed and well-nourished.  Neurological: She is alert. She has normal strength. She displays a negative Romberg sign.    Mallampati Score:     Imaging: No results found.  Labs:  CBC:  Recent Labs  01/21/16 1404 08/25/16 1534  WBC 7.8 7.8  HGB 14.9 16.2*  HCT 43.8 47.9*  PLT 258 228    COAGS:  Recent Labs  08/04/16 1437 08/11/16 1503 08/25/16 1458 08/27/16 1534  INR 4.8 2.9 2.4 2.3    BMP:  Recent Labs  01/21/16 1404  NA 136  K 3.9  CL 100  CO2 26  GLUCOSE 101*  BUN 13  CALCIUM 8.9  CREATININE 1.15*    LIVER FUNCTION TESTS:  Recent Labs  01/21/16 1404  BILITOT 1.1  AST 27  ALT 16  ALKPHOS 96  PROT 6.4  ALBUMIN 4.0    TUMOR MARKERS: No results for input(s): AFPTM, CEA, CA199, CHROMGRNA in the last 8760 hours.  Assessment and Plan:  T11 osteoporotic compression  fracture or with persistent symptoms over nearly 2 months.  Significant decrease in activities of daily living in pain control only with opioid pain medication.      I believe she would benefit from vertebral augmentation at T11.  We discussed the risks and benefits of vertebroplasty, including, but not limited to, infection, bleeding, cement extrusion, and lack of pain control.  She wishes to proceed.  This has been scheduled with Dr. Paulina FusiMark Shogry 09/01/2016.     Thank you for this interesting consult.  I greatly  enjoyed meeting [name] and look forward to participating in their care.  A copy of this report was sent to the requesting provider on this date.  Electronically Signed: Simcha Farrington W 08/27/2016, 5:24 PM   I spent a total of 15 Minutes  in face to face in clinical consultation, greater than 50% of which was counseling/coordinating care for [name]

## 2016-08-31 ENCOUNTER — Ambulatory Visit (INDEPENDENT_AMBULATORY_CARE_PROVIDER_SITE_OTHER): Payer: Medicare Other | Admitting: *Deleted

## 2016-08-31 DIAGNOSIS — I4891 Unspecified atrial fibrillation: Secondary | ICD-10-CM

## 2016-08-31 DIAGNOSIS — I639 Cerebral infarction, unspecified: Secondary | ICD-10-CM | POA: Diagnosis not present

## 2016-08-31 DIAGNOSIS — Z5181 Encounter for therapeutic drug level monitoring: Secondary | ICD-10-CM

## 2016-08-31 DIAGNOSIS — Z7901 Long term (current) use of anticoagulants: Secondary | ICD-10-CM

## 2016-08-31 LAB — POCT INR: INR: 1.5

## 2016-08-31 NOTE — Discharge Instructions (Signed)
Vertebroplasty Post Procedure Discharge Instructions  1. May resume a regular diet and any medications that you routinely take (including pain medications). 2. No driving day of procedure. 3. Upon discharge go home and rest for at least 4 hours.  May use an ice pack as needed to injection sites on back. 4. Remove bandades after shower in the morning.  5. Do not lift anything heavier than a milk jug.    Please contact our office at [phone] for the following symptoms:   Fever greater than 100 degrees  Increased swelling, pain, or redness at injection site.   Thank you [facility] visiting [facility].

## 2016-09-01 ENCOUNTER — Inpatient Hospital Stay
Admission: RE | Admit: 2016-09-01 | Discharge: 2016-09-01 | Disposition: A | Discharge status: Home / Self Care | Payer: Medicare Other | Source: Ambulatory Visit | Attending: Sports Medicine | Admitting: Sports Medicine

## 2016-09-01 ENCOUNTER — Telehealth: Payer: Self-pay | Admitting: *Deleted

## 2016-09-01 DIAGNOSIS — M4854XG Collapsed vertebra, not elsewhere classified, thoracic region, subsequent encounter for fracture with delayed healing: Secondary | ICD-10-CM

## 2016-09-01 MED ORDER — SODIUM CHLORIDE 0.9 % IV SOLN: Freq: Once | INTRAVENOUS | Status: DC

## 2016-09-01 MED ORDER — KETOROLAC TROMETHAMINE 30 MG/ML IJ SOLN: 30.0000 mg | Freq: Once | INTRAMUSCULAR | Status: AC

## 2016-09-01 MED ORDER — MIDAZOLAM HCL 2 MG/2ML IJ SOLN: 1.0000 mg | INTRAMUSCULAR | Status: DC | PRN

## 2016-09-01 MED ORDER — CEFAZOLIN SODIUM-DEXTROSE 2-4 GM/100ML-% IV SOLN: 2.0000 g | Freq: Once | INTRAVENOUS | Status: DC

## 2016-09-01 MED ORDER — FENTANYL CITRATE (PF) 100 MCG/2ML IJ SOLN: 25.0000 ug | INTRAMUSCULAR | Status: DC | PRN

## 2016-09-01 NOTE — Telephone Encounter (Signed)
Spoke with pt and she states was called [facility] and her procedure has been cancelled for today and rescheduled for Thursday Nov 16th at 730am due to INR 1.5  Spoke with Jenel [facility]soberta at [facility] and she states that she did say INR 1.5 or less would be ok but the Radiologist states did not want to do procedure at 1.5 Spoke with pt and instructed to take Lovenox 60 mg today at 8am and take tonight at 8pm and take Lovenox  60 mg at 8am 09/02/2016 in am only No Lovenox  that night Nov 15th. Also stressed not to take any coumadin and to eat dark leafy greens today and tomorrow and made an appt for her to be seen in coumadin clinic at 3pm on Nov 15th and she and her husband verbalize understanding

## 2016-09-02 ENCOUNTER — Ambulatory Visit (INDEPENDENT_AMBULATORY_CARE_PROVIDER_SITE_OTHER): Payer: Medicare Other | Admitting: *Deleted

## 2016-09-02 DIAGNOSIS — Z7901 Long term (current) use of anticoagulants: Secondary | ICD-10-CM | POA: Diagnosis not present

## 2016-09-02 DIAGNOSIS — I639 Cerebral infarction, unspecified: Secondary | ICD-10-CM | POA: Diagnosis not present

## 2016-09-02 DIAGNOSIS — Z5181 Encounter for therapeutic drug level monitoring: Secondary | ICD-10-CM

## 2016-09-02 DIAGNOSIS — I4891 Unspecified atrial fibrillation: Secondary | ICD-10-CM | POA: Diagnosis not present

## 2016-09-02 LAB — POCT INR: INR: 1.1

## 2016-09-03 ENCOUNTER — Telehealth: Payer: Self-pay | Admitting: *Deleted

## 2016-09-03 ENCOUNTER — Ambulatory Visit
Admission: RE | Admit: 2016-09-03 | Discharge: 2016-09-03 | Disposition: A | Discharge status: Home / Self Care | Payer: Medicare Other | Source: Ambulatory Visit | Attending: Sports Medicine | Admitting: Sports Medicine

## 2016-09-03 VITALS — BP 124/70 | HR 91 | Temp 97.7°F | Resp 15

## 2016-09-03 DIAGNOSIS — S22010G Wedge compression fracture of first thoracic vertebra, subsequent encounter for fracture with delayed healing: Secondary | ICD-10-CM | POA: Diagnosis not present

## 2016-09-03 DIAGNOSIS — M4854XG Collapsed vertebra, not elsewhere classified, thoracic region, subsequent encounter for fracture with delayed healing: Secondary | ICD-10-CM

## 2016-09-03 DIAGNOSIS — M4854XA Collapsed vertebra, not elsewhere classified, thoracic region, initial encounter for fracture: Secondary | ICD-10-CM

## 2016-09-03 MED ORDER — KETOROLAC TROMETHAMINE 30 MG/ML IJ SOLN
30.0000 mg | Freq: Once | INTRAMUSCULAR | Status: AC
  Administered 2016-09-03: 30 mg via INTRAVENOUS

## 2016-09-03 MED ORDER — ONDANSETRON 8 MG PO TBDP
8.0000 mg | ORAL_TABLET | Freq: Once | ORAL | Status: AC
  Administered 2016-09-03: 8 mg via ORAL

## 2016-09-03 MED ORDER — CEFAZOLIN SODIUM-DEXTROSE 2-4 GM/100ML-% IV SOLN
2.0000 g | Freq: Once | INTRAVENOUS | Status: AC
  Administered 2016-09-03: 2 g via INTRAVENOUS

## 2016-09-03 MED ORDER — MIDAZOLAM HCL 2 MG/2ML IJ SOLN
1.0000 mg | INTRAMUSCULAR | Status: DC | PRN
  Administered 2016-09-03: 1 mg via INTRAVENOUS

## 2016-09-03 MED ORDER — SODIUM CHLORIDE 0.9 % IV SOLN
Freq: Once | INTRAVENOUS | Status: AC
  Administered 2016-09-03: 08:00:00 via INTRAVENOUS

## 2016-09-03 MED ORDER — FENTANYL CITRATE (PF) 100 MCG/2ML IJ SOLN
25.0000 ug | INTRAMUSCULAR | Status: DC | PRN
  Administered 2016-09-03 (×3): 25 ug via INTRAVENOUS

## 2016-09-03 NOTE — Progress Notes (Signed)
Discharge instructions explained again to pt and husband. Explained she could go back on her Warfarin and Lovenox today.

## 2016-09-03 NOTE — Telephone Encounter (Signed)
Pt called stating her procedure went well and the Doctor said for her to restart her Lovenox and coumadin today and again went over with pt to take coumadin 2.5mg  daily  and 5mg  on Mondays Wednesdays and Fridays and to restart Lovenox 60 mg and take at 8Am and 8 Pm and to take both coumadin and Lovenox as ordered until seen in coumadin clinic on Nov 21st and she states understanding

## 2016-09-08 ENCOUNTER — Ambulatory Visit (INDEPENDENT_AMBULATORY_CARE_PROVIDER_SITE_OTHER): Payer: Medicare Other | Admitting: *Deleted

## 2016-09-08 DIAGNOSIS — I4891 Unspecified atrial fibrillation: Secondary | ICD-10-CM

## 2016-09-08 DIAGNOSIS — Z5181 Encounter for therapeutic drug level monitoring: Secondary | ICD-10-CM | POA: Diagnosis not present

## 2016-09-08 DIAGNOSIS — Z7901 Long term (current) use of anticoagulants: Secondary | ICD-10-CM | POA: Diagnosis not present

## 2016-09-08 DIAGNOSIS — I639 Cerebral infarction, unspecified: Secondary | ICD-10-CM | POA: Diagnosis not present

## 2016-09-08 LAB — POCT INR: INR: 1.4

## 2016-09-08 MED ORDER — ENOXAPARIN SODIUM 60 MG/0.6ML ~~LOC~~ SOLN: 60.0000 mg | Freq: Two times a day (BID) | SUBCUTANEOUS | 0 refills | Status: DC

## 2016-09-11 ENCOUNTER — Emergency Department (HOSPITAL_COMMUNITY): Payer: Medicare Other

## 2016-09-11 ENCOUNTER — Inpatient Hospital Stay (HOSPITAL_COMMUNITY)
Admission: EM | Admit: 2016-09-11 | Discharge: 2016-09-15 | DRG: 605 | Disposition: A | Discharge status: Home / Self Care | Payer: Medicare Other | Attending: Internal Medicine | Admitting: Internal Medicine

## 2016-09-11 ENCOUNTER — Encounter (HOSPITAL_COMMUNITY): Payer: Self-pay

## 2016-09-11 DIAGNOSIS — S20229A Contusion of unspecified back wall of thorax, initial encounter: Secondary | ICD-10-CM | POA: Diagnosis not present

## 2016-09-11 DIAGNOSIS — M545 Low back pain: Secondary | ICD-10-CM | POA: Diagnosis not present

## 2016-09-11 DIAGNOSIS — Z8249 Family history of ischemic heart disease and other diseases of the circulatory system: Secondary | ICD-10-CM

## 2016-09-11 DIAGNOSIS — Z888 Allergy status to other drugs, medicaments and biological substances status: Secondary | ICD-10-CM

## 2016-09-11 DIAGNOSIS — T148XXA Other injury of unspecified body region, initial encounter: Secondary | ICD-10-CM | POA: Diagnosis not present

## 2016-09-11 DIAGNOSIS — M549 Dorsalgia, unspecified: Secondary | ICD-10-CM | POA: Diagnosis present

## 2016-09-11 DIAGNOSIS — Z9889 Other specified postprocedural states: Secondary | ICD-10-CM

## 2016-09-11 DIAGNOSIS — E119 Type 2 diabetes mellitus without complications: Secondary | ICD-10-CM | POA: Diagnosis present

## 2016-09-11 DIAGNOSIS — I480 Paroxysmal atrial fibrillation: Secondary | ICD-10-CM

## 2016-09-11 DIAGNOSIS — S22000A Wedge compression fracture of unspecified thoracic vertebra, initial encounter for closed fracture: Secondary | ICD-10-CM | POA: Diagnosis present

## 2016-09-11 DIAGNOSIS — Z7983 Long term (current) use of bisphosphonates: Secondary | ICD-10-CM

## 2016-09-11 DIAGNOSIS — M5489 Other dorsalgia: Secondary | ICD-10-CM | POA: Diagnosis not present

## 2016-09-11 DIAGNOSIS — I110 Hypertensive heart disease with heart failure: Secondary | ICD-10-CM | POA: Diagnosis not present

## 2016-09-11 DIAGNOSIS — Z7901 Long term (current) use of anticoagulants: Secondary | ICD-10-CM

## 2016-09-11 DIAGNOSIS — Z79899 Other long term (current) drug therapy: Secondary | ICD-10-CM

## 2016-09-11 DIAGNOSIS — R52 Pain, unspecified: Secondary | ICD-10-CM | POA: Diagnosis not present

## 2016-09-11 DIAGNOSIS — I4892 Unspecified atrial flutter: Secondary | ICD-10-CM | POA: Diagnosis not present

## 2016-09-11 DIAGNOSIS — M8008XA Age-related osteoporosis with current pathological fracture, vertebra(e), initial encounter for fracture: Secondary | ICD-10-CM | POA: Diagnosis present

## 2016-09-11 DIAGNOSIS — E785 Hyperlipidemia, unspecified: Secondary | ICD-10-CM | POA: Diagnosis present

## 2016-09-11 DIAGNOSIS — Z882 Allergy status to sulfonamides status: Secondary | ICD-10-CM

## 2016-09-11 DIAGNOSIS — E039 Hypothyroidism, unspecified: Secondary | ICD-10-CM | POA: Diagnosis present

## 2016-09-11 DIAGNOSIS — I4819 Other persistent atrial fibrillation: Secondary | ICD-10-CM | POA: Diagnosis present

## 2016-09-11 DIAGNOSIS — E876 Hypokalemia: Secondary | ICD-10-CM | POA: Diagnosis present

## 2016-09-11 DIAGNOSIS — M7981 Nontraumatic hematoma of soft tissue: Secondary | ICD-10-CM | POA: Diagnosis not present

## 2016-09-11 DIAGNOSIS — I5032 Chronic diastolic (congestive) heart failure: Secondary | ICD-10-CM | POA: Diagnosis present

## 2016-09-11 DIAGNOSIS — M546 Pain in thoracic spine: Secondary | ICD-10-CM | POA: Diagnosis not present

## 2016-09-11 DIAGNOSIS — I481 Persistent atrial fibrillation: Secondary | ICD-10-CM | POA: Diagnosis present

## 2016-09-11 DIAGNOSIS — Z87891 Personal history of nicotine dependence: Secondary | ICD-10-CM

## 2016-09-11 DIAGNOSIS — Z8673 Personal history of transient ischemic attack (TIA), and cerebral infarction without residual deficits: Secondary | ICD-10-CM

## 2016-09-11 DIAGNOSIS — Y848 Other medical procedures as the cause of abnormal reaction of the patient, or of later complication, without mention of misadventure at the time of the procedure: Secondary | ICD-10-CM | POA: Diagnosis present

## 2016-09-11 DIAGNOSIS — I10 Essential (primary) hypertension: Secondary | ICD-10-CM | POA: Diagnosis present

## 2016-09-11 LAB — BASIC METABOLIC PANEL
Anion gap: 14 (ref 5–15)
BUN: 11 mg/dL (ref 6–20)
CO2: 28 mmol/L (ref 22–32)
Calcium: 8.7 mg/dL — ABNORMAL LOW (ref 8.9–10.3)
Chloride: 95 mmol/L — ABNORMAL LOW (ref 101–111)
Creatinine, Ser: 1.09 mg/dL — ABNORMAL HIGH (ref 0.44–1.00)
GFR calc Af Amer: 52 mL/min — ABNORMAL LOW (ref >60)
GFR calc non Af Amer: 45 mL/min — ABNORMAL LOW (ref >60)
Glucose, Bld: 179 mg/dL — ABNORMAL HIGH (ref 65–99)
Potassium: 3.1 mmol/L — ABNORMAL LOW (ref 3.5–5.1)
Sodium: 137 mmol/L (ref 135–145)

## 2016-09-11 LAB — CBC WITH DIFFERENTIAL/PLATELET
Basophils Absolute: 0 10*3/uL (ref 0.0–0.1)
Basophils Relative: 0 %
Eosinophils Absolute: 0 10*3/uL (ref 0.0–0.7)
Eosinophils Relative: 0 %
HCT: 47.6 % — ABNORMAL HIGH (ref 36.0–46.0)
Hemoglobin: 16.2 g/dL — ABNORMAL HIGH (ref 12.0–15.0)
Lymphocytes Relative: 12 %
Lymphs Abs: 1.1 10*3/uL (ref 0.7–4.0)
MCH: 32.5 pg (ref 26.0–34.0)
MCHC: 34 g/dL (ref 30.0–36.0)
MCV: 95.4 fL (ref 78.0–100.0)
Monocytes Absolute: 0.4 10*3/uL (ref 0.1–1.0)
Monocytes Relative: 4 %
Neutro Abs: 7.9 10*3/uL — ABNORMAL HIGH (ref 1.7–7.7)
Neutrophils Relative %: 84 %
Platelets: 259 10*3/uL (ref 150–400)
RBC: 4.99 MIL/uL (ref 3.87–5.11)
RDW: 13.5 % (ref 11.5–15.5)
WBC: 9.4 10*3/uL (ref 4.0–10.5)

## 2016-09-11 LAB — MAGNESIUM: Magnesium: 1.2 mg/dL — ABNORMAL LOW (ref 1.7–2.4)

## 2016-09-11 LAB — PROTIME-INR
INR: 1.92
Prothrombin Time: 22.3 seconds — ABNORMAL HIGH (ref 11.4–15.2)

## 2016-09-11 MED ORDER — METHOCARBAMOL 1000 MG/10ML IJ SOLN
500.0000 mg | Freq: Three times a day (TID) | INTRAVENOUS | Status: DC | PRN
  Filled 2016-09-11: qty 5

## 2016-09-11 MED ORDER — NADOLOL 20 MG PO TABS
10.0000 mg | ORAL_TABLET | Freq: Every day | ORAL | Status: DC
  Administered 2016-09-12 – 2016-09-15 (×4): 10 mg via ORAL
  Filled 2016-09-11 (×4): qty 1

## 2016-09-11 MED ORDER — ATORVASTATIN CALCIUM 10 MG PO TABS
10.0000 mg | ORAL_TABLET | Freq: Every morning | ORAL | Status: DC
  Administered 2016-09-12 – 2016-09-15 (×4): 10 mg via ORAL
  Filled 2016-09-11 (×5): qty 1

## 2016-09-11 MED ORDER — HYDROMORPHONE HCL 2 MG/ML IJ SOLN
1.0000 mg | Freq: Once | INTRAMUSCULAR | Status: AC
  Administered 2016-09-11: 1 mg via INTRAVENOUS
  Filled 2016-09-11: qty 1

## 2016-09-11 MED ORDER — ONDANSETRON HCL 4 MG PO TABS: 4.0000 mg | ORAL_TABLET | Freq: Four times a day (QID) | ORAL | Status: DC | PRN

## 2016-09-11 MED ORDER — HYDROMORPHONE HCL 2 MG/ML IJ SOLN
1.0000 mg | Freq: Once | INTRAMUSCULAR | Status: AC
  Administered 2016-09-11: 1 mg via INTRAVENOUS

## 2016-09-11 MED ORDER — ACETAMINOPHEN 650 MG RE SUPP
650.0000 mg | Freq: Four times a day (QID) | RECTAL | Status: DC | PRN
Start: 2016-09-11 — End: 2016-09-12

## 2016-09-11 MED ORDER — HYDROMORPHONE HCL 2 MG/ML IJ SOLN
0.5000 mg | INTRAMUSCULAR | Status: DC | PRN
  Administered 2016-09-12 – 2016-09-13 (×3): 0.5 mg via INTRAVENOUS
  Filled 2016-09-11 (×3): qty 1

## 2016-09-11 MED ORDER — BISACODYL 10 MG RE SUPP: 10.0000 mg | Freq: Every day | RECTAL | Status: DC | PRN

## 2016-09-11 MED ORDER — ONDANSETRON HCL 4 MG/2ML IJ SOLN
4.0000 mg | Freq: Once | INTRAMUSCULAR | Status: AC
  Administered 2016-09-11: 4 mg via INTRAVENOUS
  Filled 2016-09-11: qty 2

## 2016-09-11 MED ORDER — OXYCODONE HCL 5 MG PO TABS
5.0000 mg | ORAL_TABLET | ORAL | Status: DC | PRN
  Administered 2016-09-12 – 2016-09-14 (×6): 10 mg via ORAL
  Administered 2016-09-14: 5 mg via ORAL
  Filled 2016-09-11 (×7): qty 2

## 2016-09-11 MED ORDER — ACETAMINOPHEN 325 MG PO TABS: 650.0000 mg | ORAL_TABLET | Freq: Four times a day (QID) | ORAL | Status: DC | PRN

## 2016-09-11 MED ORDER — TRAMADOL HCL 50 MG PO TABS
50.0000 mg | ORAL_TABLET | Freq: Four times a day (QID) | ORAL | Status: DC | PRN
  Administered 2016-09-12: 50 mg via ORAL
  Filled 2016-09-11: qty 1

## 2016-09-11 MED ORDER — AMLODIPINE BESYLATE 5 MG PO TABS
5.0000 mg | ORAL_TABLET | Freq: Every day | ORAL | Status: DC
  Administered 2016-09-12 – 2016-09-15 (×4): 5 mg via ORAL
  Filled 2016-09-11 (×4): qty 1

## 2016-09-11 MED ORDER — LEVOTHYROXINE SODIUM 25 MCG PO TABS
25.0000 ug | ORAL_TABLET | Freq: Every day | ORAL | Status: DC
  Administered 2016-09-12 – 2016-09-15 (×4): 25 ug via ORAL
  Filled 2016-09-11 (×4): qty 1

## 2016-09-11 MED ORDER — POTASSIUM CHLORIDE CRYS ER 10 MEQ PO TBCR
10.0000 meq | EXTENDED_RELEASE_TABLET | Freq: Every day | ORAL | Status: DC
  Administered 2016-09-12 – 2016-09-15 (×4): 10 meq via ORAL
  Filled 2016-09-11 (×6): qty 1

## 2016-09-11 MED ORDER — ONDANSETRON HCL 4 MG/2ML IJ SOLN
4.0000 mg | Freq: Four times a day (QID) | INTRAMUSCULAR | Status: DC | PRN
  Administered 2016-09-12 – 2016-09-13 (×2): 4 mg via INTRAVENOUS
  Filled 2016-09-11 (×2): qty 2

## 2016-09-11 MED ORDER — DIAZEPAM 2 MG PO TABS: 2.0000 mg | ORAL_TABLET | Freq: Two times a day (BID) | ORAL | Status: DC | PRN

## 2016-09-11 MED ORDER — SODIUM CHLORIDE 0.9 % IV SOLN
INTRAVENOUS | Status: AC
Start: 2016-09-11 — End: 2016-09-12
  Administered 2016-09-12: via INTRAVENOUS

## 2016-09-11 MED ORDER — POTASSIUM CHLORIDE CRYS ER 20 MEQ PO TBCR
40.0000 meq | EXTENDED_RELEASE_TABLET | Freq: Once | ORAL | Status: AC
  Administered 2016-09-11: 40 meq via ORAL
  Filled 2016-09-11: qty 2

## 2016-09-11 MED ORDER — HYDROMORPHONE HCL 2 MG/ML IJ SOLN: 1.0000 mg | Freq: Once | INTRAMUSCULAR | Status: DC

## 2016-09-11 MED ORDER — POLYETHYLENE GLYCOL 3350 17 G PO PACK: 17.0000 g | PACK | Freq: Every day | ORAL | Status: DC | PRN

## 2016-09-11 MED ORDER — SODIUM CHLORIDE 0.9% FLUSH
3.0000 mL | Freq: Two times a day (BID) | INTRAVENOUS | Status: DC
  Administered 2016-09-11 – 2016-09-15 (×8): 3 mL via INTRAVENOUS

## 2016-09-11 NOTE — ED Notes (Signed)
EDP at bedside  

## 2016-09-11 NOTE — ED Triage Notes (Signed)
Pt. Coming from home via [facility] for back pain starting when she rolled over this morning. Pt. Recent back procedure 09/03/2016. Pt. Injection of gel into disc in back. Pt. Screaming in pain and vomiting upon arrival. Pt. Reports back pain is more to right lower back.

## 2016-09-11 NOTE — H&P (Signed)
[name] ZOX:[id number] DOB: [birth year] DOA: 09/11/2016     PCP: Neena Rhymes, MD   Outpatient Specialists: Cardiology  Patient coming from: home Lives  With family   Chief Complaint: back pain  HPI: [name] is a 82 y.o. female with medical history significant of a.fib on coumadin, T11 osteoporotic compression fractures status post kyphoplasty, DM 2, HTN, HL,     Presented with severe back pain started when she tried a rollover this morning. Pain was so severe the patient was screaming out and vomiting. Anus located in the middle right side of her back she states it feels except have shifted no associated numbness or weakness no abdominal pain no chest pain or shortness of breath.  Lives at home with her elderly husband EMS was called.  November 9 patient undergone vertebroplasty to treat T11 osteoporotic compression fracture with which she had been having persistent symptoms. Patient restarted Lovenox bridging to Coumadin on November 16 her last dose of Lovenox was yesterday. She took coumadin this AM. Family states she have had some episodes of light headedness and while in ER family noted that her hear rate has been diping in 30's. Of note patient did receive 2 doses of Dilaudid back-to-back at that time.   Regarding pertinent Chronic problems: History of atrial fibrillation on Coumadin followed by cardiology, also has history of diabetes. Patient had a CVA in 2010 in [place] she was given tPA it was associated with loss of speech. She had full recovery with no deficits.   IN ER:  Temp (24hrs), Avg:98.5 F (36.9 C), Min:98.5 F (36.9 C), Max:98.5 F (36.9 C) HR 104 BP 112/88 WBC 9.4 hemoglobin 16.2 INR 1.92  MRI of the thoracic lumbar spine showed hematoma paraspinous musculature from T10-L2 3 level Following Medications were ordered in ER: Medications  HYDROmorphone (DILAUDID) injection 1 mg (1 mg Intravenous Given 09/11/16 1401)    HYDROmorphone (DILAUDID) injection 1 mg (1 mg Intravenous Given 09/11/16 1430)  HYDROmorphone (DILAUDID) injection 1 mg (1 mg Intravenous Given 09/11/16 1730)  ondansetron (ZOFRAN) injection 4 mg (4 mg Intravenous Given 09/11/16 1730)     ER provider discussed case with:  IR  Hospitalist was called for admission for pain management  Review of Systems:    Pertinent positives include: Back pain, nausea, vomiting,   Constitutional:  No weight loss, night sweats, Fevers, chills, fatigue, weight loss  HEENT:  No headaches, Difficulty swallowing,Tooth/dental problems,Sore throat,  No sneezing, itching, ear ache, nasal congestion, post nasal drip,  Cardio-vascular:  No chest pain, Orthopnea, PND, anasarca, dizziness, palpitations.no Bilateral lower extremity swelling  GI:  No heartburn, indigestion, abdominal pain, diarrhea, change in bowel habits, loss of appetite, melena, blood in stool, hematemesis Resp:  no shortness of breath at rest. No dyspnea on exertion, No excess mucus, no productive cough, No non-productive cough, No coughing up of blood.No change in color of mucus.No wheezing. Skin:  no rash or lesions. No jaundice GU:  no dysuria, change in color of urine, no urgency or frequency. No straining to urinate.  No flank pain.  Musculoskeletal:  No joint pain or no joint swelling. No decreased range of motion. No back pain.  Psych:  No change in mood or affect. No depression or anxiety. No memory loss.  Neuro: no localizing neurological complaints, no tingling, no weakness, no double vision, no gait abnormality, no slurred speech, no confusion  As per HPI otherwise 10 point review of systems negative.   Past  Medical History: Past Medical History:  Diagnosis Date  . Atrial fibrillation (HCC)   . Chronic anticoagulation   . Chronic anticoagulation   . CVA (cerebrovascular accident) (HCC)   . Diabetes mellitus   . History of chicken pox   . Hyperlipidemia   .  Hypertension    Past Surgical History:  Procedure Laterality Date  . APPENDECTOMY    . CARDIAC CATHETERIZATION  11/09/91   EF 70%  . DILATION AND CURETTAGE OF UTERUS       Social History:  Ambulatory  Independently     reports that she quit smoking about 17 years ago. She has never used smokeless tobacco. She reports that she does not drink alcohol or use drugs.  Allergies:   Allergies  Allergen Reactions  . Cozaar Other (See Comments)    "Almost passed out"  . Hydralazine Other (See Comments)    "almost passed out"  . Hyzaar [Losartan Potassium-Hctz] Other (See Comments)    "almost passed out"  . Sulfa Drugs Cross Reactors Other (See Comments)    "turned me blue"   . Lisinopril     Unknown reaction, per pt       Family History:   Family History  Problem Relation Age of Onset  . Heart disease Mother   . Hypertension Mother   . Stroke Mother   . Heart disease Father   . Heart failure Brother     Medications: Prior to Admission medications   Medication Sig Start Date End Date Taking? Authorizing Provider  alendronate (FOSAMAX) 70 MG tablet Take 1 tablet (70 mg total) by mouth every 7 (seven) days. Take with a full glass of water on an empty stomach. 05/27/16  Yes Sheliah Hatch, MD  amLODipine (NORVASC) 5 MG tablet Take 1 tablet (5 mg total) by mouth daily. 03/09/16  Yes Rosalio Macadamia, NP  atorvastatin (LIPITOR) 20 MG tablet Take 0.5 tablets (10 mg total) by mouth every morning. 09/16/15  Yes Cassell Clement, MD  diazepam (VALIUM) 2 MG tablet Take 1 tablet (2 mg total) by mouth 2 (two) times daily as needed for anxiety. 11/21/15  Yes Cassell Clement, MD  enoxaparin (LOVENOX) 60 MG/0.6ML injection Inject 0.6 mLs (60 mg total) into the skin every 12 (twelve) hours. 09/08/16  Yes Chilton Si, MD  furosemide (LASIX) 20 MG tablet Take 10 mg by mouth as needed for fluid or edema.   Yes Historical Provider, MD  levothyroxine (SYNTHROID, LEVOTHROID) 25 MCG tablet  TAKE ONE TABLET BY MOUTH ONCE DAILY BEFORE  BREAKFAST 06/10/16  Yes Rosalio Macadamia, NP  nadolol (CORGARD) 20 MG tablet Take 0.5 tablets (10 mg total) by mouth daily. 06/25/16  Yes Chilton Si, MD  nitroGLYCERIN (NITROSTAT) 0.4 MG SL tablet Place 0.4 mg under the tongue every 5 (five) minutes as needed (chest pain).    Yes Historical Provider, MD  potassium chloride (K-DUR) 10 MEQ tablet Take 1 tablet (10 mEq total) by mouth daily. 03/09/16  Yes Rosalio Macadamia, NP  tiZANidine (ZANAFLEX) 2 MG tablet TAKE ONE TABLET BY MOUTH EVERY 8 HOURS AS NEEDED FOR MUSCLE SPASM. 08/21/16  Yes Sheliah Hatch, MD  traMADol (ULTRAM) 50 MG tablet Take 50 mg by mouth. Take every 6 to 8 hours as needed for pain   Yes Historical Provider, MD  warfarin (COUMADIN) 5 MG tablet TAKE TABLET BY MOUTH AS DIRECTED BY COUMADIN CLINIC 06/24/16  Yes Chilton Si, MD  Calcium Carbonate-Vitamin D (CALCIUM 600 + D PO) Take  1 tablet by mouth daily.     Historical Provider, MD  clotrimazole-betamethasone (LOTRISONE) cream Apply 1 application topically 2 (two) times daily. Patient not taking: Reported on 09/11/2016 04/29/16   Sheliah HatchKatherine E Tabori, MD  mirabegron ER (MYRBETRIQ) 25 MG TB24 tablet Take 25 mg by mouth daily. Reported on 04/29/2016    Historical Provider, MD    Physical Exam: Patient Vitals for the past 24 hrs:  BP Temp Temp src Pulse Resp SpO2 Height Weight  09/11/16 1730 112/88 - - 104 17 97 % - -  09/11/16 1545 158/83 - - 94 17 96 % - -  09/11/16 1415 138/100 - - 85 17 96 % - -  09/11/16 1345 (!) 170/104 - - 82 20 94 % - -  09/11/16 1332 - - - - - - 5\' 2"  (1.575 m) 60.3 kg (133 lb)  09/11/16 1331 163/84 98.5 F (36.9 C) Oral 85 16 98 % - -    1. General:  in No Acute distress 2. Psychological: Alert and  Oriented 3. Head/ENT:   Dry Mucous Membranes                          Head Non traumatic, neck supple                            Poor Dentition 4. SKIN:   decreased Skin turgor,  Skin clean Dry and intact  no rash, ruising from lovenox shots on the abdomen.  5. Heart: Regular rate and rhythm no  Murmur, Rub or gallop 6. Lungs: Clear to auscultation bilaterally, no wheezes or crackles   7. Abdomen: Soft,  non-tender, Non distended 8. Lower extremities: no clubbing, cyanosis, or edema 9. Neurologically Grossly intact, moving all 4 extremities equally  10. MSK: Normal range of motion   body mass index is 24.33 kg/m.  Labs on Admission:   Labs on Admission: I have personally reviewed following labs and imaging studies  CBC:  Recent Labs Lab 09/11/16 1351  WBC 9.4  NEUTROABS 7.9*  HGB 16.2*  HCT 47.6*  MCV 95.4  PLT 259   Basic Metabolic Panel:  Recent Labs Lab 09/11/16 1351  NA 137  K 3.1*  CL 95*  CO2 28  GLUCOSE 179*  BUN 11  CREATININE 1.09*  CALCIUM 8.7*   GFR: Estimated Creatinine Clearance: 32.9 mL/min (by C-G formula based on SCr of 1.09 mg/dL (H)). Liver Function Tests: No results for input(s): AST, ALT, ALKPHOS, BILITOT, PROT, ALBUMIN in the last 168 hours. No results for input(s): LIPASE, AMYLASE in the last 168 hours. No results for input(s): AMMONIA in the last 168 hours. Coagulation Profile:  Recent Labs Lab 09/08/16 1610 09/11/16 1351  INR 1.4 1.92   Cardiac Enzymes: No results for input(s): CKTOTAL, CKMB, CKMBINDEX, TROPONINI in the last 168 hours. BNP (last 3 results) No results for input(s): PROBNP in the last 8760 hours. HbA1C: No results for input(s): HGBA1C in the last 72 hours. CBG: No results for input(s): GLUCAP in the last 168 hours. Lipid Profile: No results for input(s): CHOL, HDL, LDLCALC, TRIG, CHOLHDL, LDLDIRECT in the last 72 hours. Thyroid Function Tests: No results for input(s): TSH, T4TOTAL, FREET4, T3FREE, THYROIDAB in the last 72 hours. Anemia Panel: No results for input(s): VITAMINB12, FOLATE, FERRITIN, TIBC, IRON, RETICCTPCT in the last 72 hours.   @LABRCNTIP (procalcitonin:4,lacticidven:4) )No results found for  this or any previous visit (from  the past 240 hour(s)).     UA not ordered  No results found for: HGBA1C  Estimated Creatinine Clearance: 32.9 mL/min (by C-G formula based on SCr of 1.09 mg/dL (H)).  BNP (last 3 results) No results for input(s): PROBNP in the last 8760 hours.   ECG REPORT Not obtained  Filed Weights   09/11/16 1332  Weight: 60.3 kg (133 lb)     Cultures: No results found for: SDES, SPECREQUEST, CULT, REPTSTATUS   Radiological Exams on Admission: Dg Thoracic Spine 2 View  Result Date: 09/11/2016 CLINICAL DATA:  Acute right-sided thoracic spine pain. No known injury. EXAM: THORACIC SPINE 2 VIEWS COMPARISON:  MRI of August 11, 2016. FINDINGS: Atherosclerosis of thoracic aorta is noted. Mild dextroscoliosis of upper thoracic spine is noted. Status post kyphoplasty of T11. No acute fracture or spondylolisthesis is noted. IMPRESSION: Aortic atherosclerosis. Status post kyphoplasty of T11. No acute abnormality seen in thoracic spine. Electronically Signed   By: Lupita RaiderJames  Green Jr, M.D.   On: 09/11/2016 15:26   Mr Thoracic Spine Wo Contrast  Result Date: 09/11/2016 CLINICAL DATA:  Severe back pain. Status post kyphoplasty on November 16. EXAM: MRI THORACIC AND LUMBAR SPINE WITHOUT CONTRAST TECHNIQUE: Multiplanar and multiecho pulse sequences of the thoracic and lumbar spine were obtained without intravenous contrast. COMPARISON:  MRI thoracic and lumbar spine 08/11/2016 FINDINGS: MRI THORACIC SPINE FINDINGS Alignment:  Normal Vertebrae: There is again seen compression deformity of the T11 vertebral body with approximately 50% central height loss. Vertebral augmentation cement is now present. There are degenerative endplate signal changes at L1-L2. Otherwise, marrow signal is normal. No evidence of discitis osteomyelitis or epidural collection. Cord:  Normal caliber and signal. Paraspinal and other soft tissues: There is a large right posterior paraspinal collection that  extends from the T10 level to the L2-3 level and measures 4.4 x 3.4 cm in greatest transverse and AP dimensions. The craniocaudal extent is approximately 10 cm. Disc levels: Mild narrowing of the spinal canal at the T10-T11 level is unchanged. There is no other area of spinal canal or neural foraminal stenosis. MRI LUMBAR SPINE FINDINGS Segmentation:  Normal Alignment:  Grade 1 anterolisthesis of L4-L5 Vertebrae: Multifocal endplate degenerative signal changes. No focal marrow lesion. No discitis osteomyelitis or epidural collection. Conus medullaris: Extends to the L1 level and appears normal. Paraspinal and other soft tissues: As above, there is a collection with multiple fluid levels within the right-sided paraspinous musculature that measures approximately 4.4 x 3.4 x 10.4 cm. Disc levels: T12-L1: Normal disc space and facet joints. No spinal canal stenosis. No neuroforaminal stenosis. L1-L2: Mild disc bulge. No spinal canal stenosis. No neuroforaminal stenosis. L2-L3: Left foraminal disc protrusion. No spinal canal stenosis. Mild left lateral recess and left neuroforaminal stenosis. L3-L4: Left eccentric disc bulge. No spinal canal stenosis. Moderate left lateral recess and left neuroforaminal stenosis. L4-L5: Diffuse disc bulge with mild narrowing of both lateral recesses. No spinal canal stenosis. Mild right neuroforaminal stenosis. L5-S1: Synovial cyst arising from the anterior aspect of the right facet joint is in close proximity to the exiting right L5 nerve root. There is bilateral fluid within the facet joints. No spinal canal stenosis. No neuroforaminal stenosis. IMPRESSION: 1. Hematoma within the right-sided paraspinous musculature extending from the T10 level to the L2-3 level, measuring 0.4 x 3.4 x 10.4 cm. 2. Status post vertebral augmentation procedure at T11 without progression of height loss. 3. No thoracic spinal canal or neural foraminal stenosis. 4. Moderate left lateral recess and left  foraminal stenosis at L3-L4. This could serve as a source for left L3 and/or L4 distribution radiculopathy. 5. Synovial cyst arising from the right L5-S1 facet, in close proximity the exiting right L5 nerve root. Correlate for right L5 radiculopathy. Electronically Signed   By: Deatra Robinson M.D.   On: 09/11/2016 17:38   Mr Lumbar Spine Wo Contrast  Result Date: 09/11/2016 CLINICAL DATA:  Severe back pain. Status post kyphoplasty on November 16. EXAM: MRI THORACIC AND LUMBAR SPINE WITHOUT CONTRAST TECHNIQUE: Multiplanar and multiecho pulse sequences of the thoracic and lumbar spine were obtained without intravenous contrast. COMPARISON:  MRI thoracic and lumbar spine 08/11/2016 FINDINGS: MRI THORACIC SPINE FINDINGS Alignment:  Normal Vertebrae: There is again seen compression deformity of the T11 vertebral body with approximately 50% central height loss. Vertebral augmentation cement is now present. There are degenerative endplate signal changes at L1-L2. Otherwise, marrow signal is normal. No evidence of discitis osteomyelitis or epidural collection. Cord:  Normal caliber and signal. Paraspinal and other soft tissues: There is a large right posterior paraspinal collection that extends from the T10 level to the L2-3 level and measures 4.4 x 3.4 cm in greatest transverse and AP dimensions. The craniocaudal extent is approximately 10 cm. Disc levels: Mild narrowing of the spinal canal at the T10-T11 level is unchanged. There is no other area of spinal canal or neural foraminal stenosis. MRI LUMBAR SPINE FINDINGS Segmentation:  Normal Alignment:  Grade 1 anterolisthesis of L4-L5 Vertebrae: Multifocal endplate degenerative signal changes. No focal marrow lesion. No discitis osteomyelitis or epidural collection. Conus medullaris: Extends to the L1 level and appears normal. Paraspinal and other soft tissues: As above, there is a collection with multiple fluid levels within the right-sided paraspinous musculature that  measures approximately 4.4 x 3.4 x 10.4 cm. Disc levels: T12-L1: Normal disc space and facet joints. No spinal canal stenosis. No neuroforaminal stenosis. L1-L2: Mild disc bulge. No spinal canal stenosis. No neuroforaminal stenosis. L2-L3: Left foraminal disc protrusion. No spinal canal stenosis. Mild left lateral recess and left neuroforaminal stenosis. L3-L4: Left eccentric disc bulge. No spinal canal stenosis. Moderate left lateral recess and left neuroforaminal stenosis. L4-L5: Diffuse disc bulge with mild narrowing of both lateral recesses. No spinal canal stenosis. Mild right neuroforaminal stenosis. L5-S1: Synovial cyst arising from the anterior aspect of the right facet joint is in close proximity to the exiting right L5 nerve root. There is bilateral fluid within the facet joints. No spinal canal stenosis. No neuroforaminal stenosis. IMPRESSION: 1. Hematoma within the right-sided paraspinous musculature extending from the T10 level to the L2-3 level, measuring 0.4 x 3.4 x 10.4 cm. 2. Status post vertebral augmentation procedure at T11 without progression of height loss. 3. No thoracic spinal canal or neural foraminal stenosis. 4. Moderate left lateral recess and left foraminal stenosis at L3-L4. This could serve as a source for left L3 and/or L4 distribution radiculopathy. 5. Synovial cyst arising from the right L5-S1 facet, in close proximity the exiting right L5 nerve root. Correlate for right L5 radiculopathy. Electronically Signed   By: Deatra Robinson M.D.   On: 09/11/2016 17:38    Chart has been reviewed    Assessment/Plan  82 y.o. female with medical history significant of a.fib on coumadin, T11 osteoporotic compression fractures status post kyphoplasty, DM 2, HTN, HL admitted for severe back pain In a setting of paraspinal hematoma while being anticoagulated for atrial fibrillation status post recent kyphoplasty Present on Admission: . Hypokalemia will replace check magnesium  . PAF  (  paroxysmal atrial fibrillation) (HCC) - CHA2DS2 -vasc score  5 heart rate maintained with nadolol given acute paraspinal hematoma will hold off anticoagulation for today. Once stable will need to discuss her family chronic anticoagulation continuation patient did have history of significant CVA . Paraspinal hematoma -  hold anticoagulation for now supportive management pain control  Bradicardia transient in a setting of pain medication but will continue to monitor on telemetry holding parameters for nadolol   Other plan as per orders.  DVT prophylaxis:  SCD    Code Status:  FULL CODE as per patient    Family Communication:   Family   at  Bedside  plan of care was discussed with   Daughter, Husband,    Disposition Plan:      To home once workup is complete and patient is stable                             Consults called: email cardiology  Admission status:    obs   Level of care     tele    I have spent a total of 56 min on this admission  Breken Nazari 09/11/2016, 9:58 PM    [facility]  Pager [phone]   after 2 AM please page floor coverage PA If 7AM-7PM, please contact the day team taking care of the patient  [email or web address]  Password [email or web address]

## 2016-09-11 NOTE — ED Notes (Signed)
Patient eating [place] sandwich meal at this time. Family at bedside. No acute distress noted.

## 2016-09-11 NOTE — ED Notes (Signed)
Dinner tray ordered at 7:15- ty

## 2016-09-11 NOTE — ED Notes (Signed)
Patient transported to X-ray 

## 2016-09-11 NOTE — ED Provider Notes (Signed)
Care assumed from Dr. Lynelle DoctorKnapp at 1430 with plan for f/u MR for fracture and operative complication evaluation.   Results:  BP 112/88   Pulse 104   Temp 98.5 F (36.9 C) (Oral)   Resp 17   Ht 5\' 2"  (1.575 m)   Wt 133 lb (60.3 kg)   SpO2 97%   BMI 24.33 kg/m   Results for orders placed or performed during the hospital encounter of 09/11/16  CBC with Differential  Result Value Ref Range   WBC 9.4 4.0 - 10.5 K/uL   RBC 4.99 3.87 - 5.11 MIL/uL   Hemoglobin 16.2 (H) 12.0 - 15.0 g/dL   HCT 16.147.6 (H) 09.636.0 - 04.546.0 %   MCV 95.4 78.0 - 100.0 fL   MCH 32.5 26.0 - 34.0 pg   MCHC 34.0 30.0 - 36.0 g/dL   RDW 40.913.5 81.111.5 - 91.415.5 %   Platelets 259 150 - 400 K/uL   Neutrophils Relative % 84 %   Neutro Abs 7.9 (H) 1.7 - 7.7 K/uL   Lymphocytes Relative 12 %   Lymphs Abs 1.1 0.7 - 4.0 K/uL   Monocytes Relative 4 %   Monocytes Absolute 0.4 0.1 - 1.0 K/uL   Eosinophils Relative 0 %   Eosinophils Absolute 0.0 0.0 - 0.7 K/uL   Basophils Relative 0 %   Basophils Absolute 0.0 0.0 - 0.1 K/uL  Basic metabolic panel  Result Value Ref Range   Sodium 137 135 - 145 mmol/L   Potassium 3.1 (L) 3.5 - 5.1 mmol/L   Chloride 95 (L) 101 - 111 mmol/L   CO2 28 22 - 32 mmol/L   Glucose, Bld 179 (H) 65 - 99 mg/dL   BUN 11 6 - 20 mg/dL   Creatinine, Ser 7.821.09 (H) 0.44 - 1.00 mg/dL   Calcium 8.7 (L) 8.9 - 10.3 mg/dL   GFR calc non Af Amer 45 (L) >60 mL/min   GFR calc Af Amer 52 (L) >60 mL/min   Anion gap 14 5 - 15  Protime-INR  Result Value Ref Range   Prothrombin Time 22.3 (H) 11.4 - 15.2 seconds   INR 1.92     Dg Thoracic Spine 2 View  Result Date: 09/11/2016 CLINICAL DATA:  Acute right-sided thoracic spine pain. No known injury. EXAM: THORACIC SPINE 2 VIEWS COMPARISON:  MRI of August 11, 2016. FINDINGS: Atherosclerosis of thoracic aorta is noted. Mild dextroscoliosis of upper thoracic spine is noted. Status post kyphoplasty of T11. No acute fracture or spondylolisthesis is noted. IMPRESSION: Aortic  atherosclerosis. Status post kyphoplasty of T11. No acute abnormality seen in thoracic spine. Electronically Signed   By: Lupita RaiderJames  Green Jr, M.D.   On: 09/11/2016 15:26   Mr Thoracic Spine Wo Contrast  Result Date: 09/11/2016 CLINICAL DATA:  Severe back pain. Status post kyphoplasty on November 16. EXAM: MRI THORACIC AND LUMBAR SPINE WITHOUT CONTRAST TECHNIQUE: Multiplanar and multiecho pulse sequences of the thoracic and lumbar spine were obtained without intravenous contrast. COMPARISON:  MRI thoracic and lumbar spine 08/11/2016 FINDINGS: MRI THORACIC SPINE FINDINGS Alignment:  Normal Vertebrae: There is again seen compression deformity of the T11 vertebral body with approximately 50% central height loss. Vertebral augmentation cement is now present. There are degenerative endplate signal changes at L1-L2. Otherwise, marrow signal is normal. No evidence of discitis osteomyelitis or epidural collection. Cord:  Normal caliber and signal. Paraspinal and other soft tissues: There is a large right posterior paraspinal collection that extends from the T10 level to the L2-3 level and  measures 4.4 x 3.4 cm in greatest transverse and AP dimensions. The craniocaudal extent is approximately 10 cm. Disc levels: Mild narrowing of the spinal canal at the T10-T11 level is unchanged. There is no other area of spinal canal or neural foraminal stenosis. MRI LUMBAR SPINE FINDINGS Segmentation:  Normal Alignment:  Grade 1 anterolisthesis of L4-L5 Vertebrae: Multifocal endplate degenerative signal changes. No focal marrow lesion. No discitis osteomyelitis or epidural collection. Conus medullaris: Extends to the L1 level and appears normal. Paraspinal and other soft tissues: As above, there is a collection with multiple fluid levels within the right-sided paraspinous musculature that measures approximately 4.4 x 3.4 x 10.4 cm. Disc levels: T12-L1: Normal disc space and facet joints. No spinal canal stenosis. No neuroforaminal  stenosis. L1-L2: Mild disc bulge. No spinal canal stenosis. No neuroforaminal stenosis. L2-L3: Left foraminal disc protrusion. No spinal canal stenosis. Mild left lateral recess and left neuroforaminal stenosis. L3-L4: Left eccentric disc bulge. No spinal canal stenosis. Moderate left lateral recess and left neuroforaminal stenosis. L4-L5: Diffuse disc bulge with mild narrowing of both lateral recesses. No spinal canal stenosis. Mild right neuroforaminal stenosis. L5-S1: Synovial cyst arising from the anterior aspect of the right facet joint is in close proximity to the exiting right L5 nerve root. There is bilateral fluid within the facet joints. No spinal canal stenosis. No neuroforaminal stenosis. IMPRESSION: 1. Hematoma within the right-sided paraspinous musculature extending from the T10 level to the L2-3 level, measuring 0.4 x 3.4 x 10.4 cm. 2. Status post vertebral augmentation procedure at T11 without progression of height loss. 3. No thoracic spinal canal or neural foraminal stenosis. 4. Moderate left lateral recess and left foraminal stenosis at L3-L4. This could serve as a source for left L3 and/or L4 distribution radiculopathy. 5. Synovial cyst arising from the right L5-S1 facet, in close proximity the exiting right L5 nerve root. Correlate for right L5 radiculopathy. Electronically Signed   By: Deatra Robinson M.D.   On: 09/11/2016 17:38   Mr Lumbar Spine Wo Contrast  Result Date: 09/11/2016 CLINICAL DATA:  Severe back pain. Status post kyphoplasty on November 16. EXAM: MRI THORACIC AND LUMBAR SPINE WITHOUT CONTRAST TECHNIQUE: Multiplanar and multiecho pulse sequences of the thoracic and lumbar spine were obtained without intravenous contrast. COMPARISON:  MRI thoracic and lumbar spine 08/11/2016 FINDINGS: MRI THORACIC SPINE FINDINGS Alignment:  Normal Vertebrae: There is again seen compression deformity of the T11 vertebral body with approximately 50% central height loss. Vertebral augmentation  cement is now present. There are degenerative endplate signal changes at L1-L2. Otherwise, marrow signal is normal. No evidence of discitis osteomyelitis or epidural collection. Cord:  Normal caliber and signal. Paraspinal and other soft tissues: There is a large right posterior paraspinal collection that extends from the T10 level to the L2-3 level and measures 4.4 x 3.4 cm in greatest transverse and AP dimensions. The craniocaudal extent is approximately 10 cm. Disc levels: Mild narrowing of the spinal canal at the T10-T11 level is unchanged. There is no other area of spinal canal or neural foraminal stenosis. MRI LUMBAR SPINE FINDINGS Segmentation:  Normal Alignment:  Grade 1 anterolisthesis of L4-L5 Vertebrae: Multifocal endplate degenerative signal changes. No focal marrow lesion. No discitis osteomyelitis or epidural collection. Conus medullaris: Extends to the L1 level and appears normal. Paraspinal and other soft tissues: As above, there is a collection with multiple fluid levels within the right-sided paraspinous musculature that measures approximately 4.4 x 3.4 x 10.4 cm. Disc levels: T12-L1: Normal disc space and  facet joints. No spinal canal stenosis. No neuroforaminal stenosis. L1-L2: Mild disc bulge. No spinal canal stenosis. No neuroforaminal stenosis. L2-L3: Left foraminal disc protrusion. No spinal canal stenosis. Mild left lateral recess and left neuroforaminal stenosis. L3-L4: Left eccentric disc bulge. No spinal canal stenosis. Moderate left lateral recess and left neuroforaminal stenosis. L4-L5: Diffuse disc bulge with mild narrowing of both lateral recesses. No spinal canal stenosis. Mild right neuroforaminal stenosis. L5-S1: Synovial cyst arising from the anterior aspect of the right facet joint is in close proximity to the exiting right L5 nerve root. There is bilateral fluid within the facet joints. No spinal canal stenosis. No neuroforaminal stenosis. IMPRESSION: 1. Hematoma within the  right-sided paraspinous musculature extending from the T10 level to the L2-3 level, measuring 0.4 x 3.4 x 10.4 cm. 2. Status post vertebral augmentation procedure at T11 without progression of height loss. 3. No thoracic spinal canal or neural foraminal stenosis. 4. Moderate left lateral recess and left foraminal stenosis at L3-L4. This could serve as a source for left L3 and/or L4 distribution radiculopathy. 5. Synovial cyst arising from the right L5-S1 facet, in close proximity the exiting right L5 nerve root. Correlate for right L5 radiculopathy. Electronically Signed   By: Deatra RobinsonKevin  Herman M.D.   On: 09/11/2016 17:38    Radiology and laboratory examinations were reviewed by me and used in medical decision making if performed.   MDM:  MR demonstrates no fracture, there is a T10-L3 hematoma on the right paraspinal musculature complicated by anticoagulation status that is the likely etiology of her symptoms. Hb is stable, not supratherapeutic on INR, HDS.   Discussed potential options for management with patient and family who are concerned that she has had great difficulty with pain control at home as well as performing transfers. It will take time for the bleeding to tamponade and pain to subside but no surgical indication. Hospitalist consulted for admission to help with pain control and PT eval.   Diagnoses that have been ruled out:  None  Diagnoses that are still under consideration:  None  Final diagnoses:  Back pain  Paraspinal hematoma      Lyndal Pulleyaniel Sadarius Norman, MD 09/11/16 Windell [id number]

## 2016-09-11 NOTE — ED Notes (Addendum)
Patient transported to MRI 

## 2016-09-11 NOTE — ED Provider Notes (Signed)
[facility]-EMERGENCY DEPT Provider Note   CSN: [id number] Arrival date & time: 09/11/16  1328     History   Chief Complaint Chief Complaint  Patient presents with  . Post-op Problem    HPI [name] is a 82 y.o. female.  HPI Patient presents to the emergency room with complaints of severe back pain.  She had a kyphoplasty procedure on November 16.  Patient had been doing well. She rolled over this morning and experienced severe pain in the middle and right side of her back. She feels like something shifted after the procedure. Pain is severe. Any movement causes the patient to cry out screaming. She denies any numbness or weakness. No abdominal pain. No chest pain or shortness of breath. Patient does have a history of chronic atrial fibrillation and has been alternating between her usual Coumadin and Lovenox.  Past Medical History:  Diagnosis Date  . Atrial fibrillation (HCC)   . Chronic anticoagulation   . Chronic anticoagulation   . CVA (cerebrovascular accident) (HCC)   . Diabetes mellitus   . History of chicken pox   . Hyperlipidemia   . Hypertension     Patient Active Problem List   Diagnosis Date Noted  . Osteoporosis 05/27/2016  . Rash and nonspecific skin eruption 04/29/2016  . Chest pain of uncertain etiology 08/22/2015  . Nocturia more than twice per night 09/11/2014  . Hypothyroidism 09/11/2014  . Symptomatic sinus bradycardia 07/12/2014  . Abdominal pain, epigastric 03/28/2014  . Encounter for therapeutic drug monitoring 11/28/2013  . UTI (urinary tract infection) 11/28/2013  . PAF (paroxysmal atrial fibrillation) (HCC) 12/15/2012  . Benign hypertensive heart disease without heart failure 03/27/2011  . Hypercholesterolemia 03/27/2011  . CVA (cerebral infarction) 01/29/2011    Past Surgical History:  Procedure Laterality Date  . APPENDECTOMY    . CARDIAC CATHETERIZATION  11/09/91   EF 70%  . DILATION AND CURETTAGE OF UTERUS      OB History     No data available       Home Medications    Prior to Admission medications   Medication Sig Start Date End Date Taking? Authorizing Provider  alendronate (FOSAMAX) 70 MG tablet Take 1 tablet (70 mg total) by mouth every 7 (seven) days. Take with a full glass of water on an empty stomach. 05/27/16  Yes Sheliah Hatch, MD  amLODipine (NORVASC) 5 MG tablet Take 1 tablet (5 mg total) by mouth daily. 03/09/16  Yes Rosalio Macadamia, NP  atorvastatin (LIPITOR) 20 MG tablet Take 0.5 tablets (10 mg total) by mouth every morning. 09/16/15  Yes Cassell Clement, MD  diazepam (VALIUM) 2 MG tablet Take 1 tablet (2 mg total) by mouth 2 (two) times daily as needed for anxiety. 11/21/15  Yes Cassell Clement, MD  enoxaparin (LOVENOX) 60 MG/0.6ML injection Inject 0.6 mLs (60 mg total) into the skin every 12 (twelve) hours. 09/08/16  Yes Chilton Si, MD  furosemide (LASIX) 20 MG tablet Take 10 mg by mouth as needed for fluid or edema.   Yes Historical Provider, MD  levothyroxine (SYNTHROID, LEVOTHROID) 25 MCG tablet TAKE ONE TABLET BY MOUTH ONCE DAILY BEFORE  BREAKFAST 06/10/16  Yes Rosalio Macadamia, NP  nadolol (CORGARD) 20 MG tablet Take 0.5 tablets (10 mg total) by mouth daily. 06/25/16  Yes Chilton Si, MD  nitroGLYCERIN (NITROSTAT) 0.4 MG SL tablet Place 0.4 mg under the tongue every 5 (five) minutes as needed (chest pain).    Yes Historical Provider, MD  potassium chloride (K-DUR) 10 MEQ tablet Take 1 tablet (10 mEq total) by mouth daily. 03/09/16  Yes Rosalio MacadamiaLori C Gerhardt, NP  tiZANidine (ZANAFLEX) 2 MG tablet TAKE ONE TABLET BY MOUTH EVERY 8 HOURS AS NEEDED FOR MUSCLE SPASM. 08/21/16  Yes Sheliah HatchKatherine E Tabori, MD  traMADol (ULTRAM) 50 MG tablet Take 50 mg by mouth. Take every 6 to 8 hours as needed for pain   Yes Historical Provider, MD  warfarin (COUMADIN) 5 MG tablet TAKE TABLET BY MOUTH AS DIRECTED BY COUMADIN CLINIC 06/24/16  Yes Chilton Siiffany Millington, MD  Calcium Carbonate-Vitamin D (CALCIUM 600 + D PO)  Take 1 tablet by mouth daily.     Historical Provider, MD  clotrimazole-betamethasone (LOTRISONE) cream Apply 1 application topically 2 (two) times daily. Patient not taking: Reported on 09/11/2016 04/29/16   Sheliah HatchKatherine E Tabori, MD  mirabegron ER (MYRBETRIQ) 25 MG TB24 tablet Take 25 mg by mouth daily. Reported on 04/29/2016    Historical Provider, MD    Family History Family History  Problem Relation Age of Onset  . Heart disease Mother   . Hypertension Mother   . Stroke Mother   . Heart disease Father   . Heart failure Brother     Social History Social History  Substance Use Topics  . Smoking status: Former Smoker    Quit date: 10/19/1998  . Smokeless tobacco: Never Used  . Alcohol use No     Allergies   Cozaar; Hydralazine; Hyzaar [losartan potassium-hctz]; Sulfa drugs cross reactors; and Lisinopril   Review of Systems Review of Systems  All other systems reviewed and are negative.    Physical Exam Updated Vital Signs BP 138/100   Pulse 85   Temp 98.5 F (36.9 C) (Oral)   Resp 17   Ht 5\' 2"  (1.575 m)   Wt 60.3 kg   SpO2 96%   BMI 24.33 kg/m   Physical Exam  Constitutional: No distress.  HENT:  Head: Normocephalic and atraumatic.  Right Ear: External ear normal.  Left Ear: External ear normal.  Eyes: Conjunctivae are normal. Right eye exhibits no discharge. Left eye exhibits no discharge. No scleral icterus.  Neck: Neck supple. No tracheal deviation present.  Cardiovascular: Normal rate and intact distal pulses.  An irregularly irregular rhythm present.  Pulmonary/Chest: Effort normal and breath sounds normal. No stridor. No respiratory distress. She has no wheezes. She has no rales.  Abdominal: Soft. Bowel sounds are normal. She exhibits no distension. There is no tenderness. There is no rebound and no guarding.  Musculoskeletal: She exhibits no edema.       Lumbar back: She exhibits tenderness. She exhibits no swelling.  Tenderness to palpation primarily  to the right of midline in the lower thoracic region, no erythema  Neurological: She is alert. She has normal strength. No cranial nerve deficit (no facial droop, extraocular movements intact, no slurred speech) or sensory deficit. She exhibits normal muscle tone. She displays no seizure activity. Coordination normal.  Skin: Skin is warm and dry. No rash noted.  Psychiatric: She has a normal mood and affect.  Nursing note and vitals reviewed.    ED Treatments / Results  Labs (all labs ordered are listed, but only abnormal results are displayed) Labs Reviewed  CBC WITH DIFFERENTIAL/PLATELET - Abnormal; Notable for the following:       Result Value   Hemoglobin 16.2 (*)    HCT 47.6 (*)    Neutro Abs 7.9 (*)    All other components within normal  limits  BASIC METABOLIC PANEL - Abnormal; Notable for the following:    Potassium 3.1 (*)    Chloride 95 (*)    Glucose, Bld 179 (*)    Creatinine, Ser 1.09 (*)    Calcium 8.7 (*)    GFR calc non Af Amer 45 (*)    GFR calc Af Amer 52 (*)    All other components within normal limits  PROTIME-INR - Abnormal; Notable for the following:    Prothrombin Time 22.3 (*)    All other components within normal limits     Radiology Dg Thoracic Spine 2 View  Result Date: 09/11/2016 CLINICAL DATA:  Acute right-sided thoracic spine pain. No known injury. EXAM: THORACIC SPINE 2 VIEWS COMPARISON:  MRI of August 11, 2016. FINDINGS: Atherosclerosis of thoracic aorta is noted. Mild dextroscoliosis of upper thoracic spine is noted. Status post kyphoplasty of T11. No acute fracture or spondylolisthesis is noted. IMPRESSION: Aortic atherosclerosis. Status post kyphoplasty of T11. No acute abnormality seen in thoracic spine. Electronically Signed   By: Lupita RaiderJames  Green Jr, M.D.   On: 09/11/2016 15:26    Procedures Procedures (including critical care time)  Medications Ordered in ED Medications  HYDROmorphone (DILAUDID) injection 1 mg (1 mg Intravenous Given  09/11/16 1401)  HYDROmorphone (DILAUDID) injection 1 mg (1 mg Intravenous Given 09/11/16 1430)     Initial Impression / Assessment and Plan / ED Course  I have reviewed the triage vital signs and the nursing notes.  Pertinent labs & imaging results that were available during my care of the patient were reviewed by me and considered in my medical decision making (see chart for details).  Clinical Course as of Sep 11 1530  Fri Sep 11, 2016  1503 Labs reviewed.  Electrolyte abnormalities noted but doubt clinically significant.  WBC normal. Inr is 1.92  [JK]  1509 D/w Dr Eulah PontMurphy.  Recommends contacting [place] radiology who did the procedure  [JK]  1512 Discussed with Dr Ruthe MannanMcCollough.  Would recommend MRI if plain films are negative.  Pt is at risk for additional fractures.  [JK]  1530 Plain films are negative.  Will order MRI of T an L spine.    [JK]    Clinical Course User Index [JK] Linwood DibblesJon Quanasia Defino, MD   Case will be turned over to Dr Clydene PughKnott  Final Clinical Impressions(s) / ED Diagnoses   Final diagnoses:  Back pain  Back pain      Linwood DibblesJon Margy Sumler, MD 09/11/16 1531

## 2016-09-12 ENCOUNTER — Encounter (HOSPITAL_COMMUNITY): Payer: Self-pay | Admitting: General Surgery

## 2016-09-12 DIAGNOSIS — T148XXA Other injury of unspecified body region, initial encounter: Secondary | ICD-10-CM | POA: Diagnosis not present

## 2016-09-12 DIAGNOSIS — I481 Persistent atrial fibrillation: Secondary | ICD-10-CM | POA: Diagnosis present

## 2016-09-12 DIAGNOSIS — M549 Dorsalgia, unspecified: Secondary | ICD-10-CM | POA: Diagnosis not present

## 2016-09-12 DIAGNOSIS — Y848 Other medical procedures as the cause of abnormal reaction of the patient, or of later complication, without mention of misadventure at the time of the procedure: Secondary | ICD-10-CM | POA: Diagnosis present

## 2016-09-12 DIAGNOSIS — Z8673 Personal history of transient ischemic attack (TIA), and cerebral infarction without residual deficits: Secondary | ICD-10-CM | POA: Diagnosis not present

## 2016-09-12 DIAGNOSIS — I5032 Chronic diastolic (congestive) heart failure: Secondary | ICD-10-CM

## 2016-09-12 DIAGNOSIS — Z7983 Long term (current) use of bisphosphonates: Secondary | ICD-10-CM | POA: Diagnosis not present

## 2016-09-12 DIAGNOSIS — M7981 Nontraumatic hematoma of soft tissue: Secondary | ICD-10-CM | POA: Diagnosis not present

## 2016-09-12 DIAGNOSIS — E785 Hyperlipidemia, unspecified: Secondary | ICD-10-CM | POA: Diagnosis present

## 2016-09-12 DIAGNOSIS — Z8249 Family history of ischemic heart disease and other diseases of the circulatory system: Secondary | ICD-10-CM | POA: Diagnosis not present

## 2016-09-12 DIAGNOSIS — Z882 Allergy status to sulfonamides status: Secondary | ICD-10-CM | POA: Diagnosis not present

## 2016-09-12 DIAGNOSIS — I480 Paroxysmal atrial fibrillation: Secondary | ICD-10-CM | POA: Diagnosis not present

## 2016-09-12 DIAGNOSIS — E039 Hypothyroidism, unspecified: Secondary | ICD-10-CM | POA: Diagnosis present

## 2016-09-12 DIAGNOSIS — E119 Type 2 diabetes mellitus without complications: Secondary | ICD-10-CM | POA: Diagnosis present

## 2016-09-12 DIAGNOSIS — Z7901 Long term (current) use of anticoagulants: Secondary | ICD-10-CM | POA: Diagnosis not present

## 2016-09-12 DIAGNOSIS — I4892 Unspecified atrial flutter: Secondary | ICD-10-CM | POA: Diagnosis present

## 2016-09-12 DIAGNOSIS — Z888 Allergy status to other drugs, medicaments and biological substances status: Secondary | ICD-10-CM | POA: Diagnosis not present

## 2016-09-12 DIAGNOSIS — S20229A Contusion of unspecified back wall of thorax, initial encounter: Secondary | ICD-10-CM | POA: Diagnosis present

## 2016-09-12 DIAGNOSIS — E876 Hypokalemia: Secondary | ICD-10-CM | POA: Diagnosis not present

## 2016-09-12 DIAGNOSIS — M8008XA Age-related osteoporosis with current pathological fracture, vertebra(e), initial encounter for fracture: Secondary | ICD-10-CM | POA: Diagnosis present

## 2016-09-12 DIAGNOSIS — Z87891 Personal history of nicotine dependence: Secondary | ICD-10-CM | POA: Diagnosis not present

## 2016-09-12 DIAGNOSIS — Z79899 Other long term (current) drug therapy: Secondary | ICD-10-CM | POA: Diagnosis not present

## 2016-09-12 DIAGNOSIS — I110 Hypertensive heart disease with heart failure: Secondary | ICD-10-CM | POA: Diagnosis present

## 2016-09-12 LAB — COMPREHENSIVE METABOLIC PANEL
ALT: 36 U/L (ref 14–54)
AST: 48 U/L — ABNORMAL HIGH (ref 15–41)
Albumin: 3.7 g/dL (ref 3.5–5.0)
Alkaline Phosphatase: 67 U/L (ref 38–126)
Anion gap: 12 (ref 5–15)
BUN: 13 mg/dL (ref 6–20)
CO2: 31 mmol/L (ref 22–32)
Calcium: 8.5 mg/dL — ABNORMAL LOW (ref 8.9–10.3)
Chloride: 96 mmol/L — ABNORMAL LOW (ref 101–111)
Creatinine, Ser: 1.11 mg/dL — ABNORMAL HIGH (ref 0.44–1.00)
GFR calc Af Amer: 51 mL/min — ABNORMAL LOW (ref >60)
GFR calc non Af Amer: 44 mL/min — ABNORMAL LOW (ref >60)
Glucose, Bld: 153 mg/dL — ABNORMAL HIGH (ref 65–99)
Potassium: 3.4 mmol/L — ABNORMAL LOW (ref 3.5–5.1)
Sodium: 139 mmol/L (ref 135–145)
Total Bilirubin: 0.7 mg/dL (ref 0.3–1.2)
Total Protein: 6.5 g/dL (ref 6.5–8.1)

## 2016-09-12 LAB — CBC
HCT: 45.2 % (ref 36.0–46.0)
Hemoglobin: 14.9 g/dL (ref 12.0–15.0)
MCH: 32 pg (ref 26.0–34.0)
MCHC: 33 g/dL (ref 30.0–36.0)
MCV: 97 fL (ref 78.0–100.0)
Platelets: 254 10*3/uL (ref 150–400)
RBC: 4.66 MIL/uL (ref 3.87–5.11)
RDW: 13.9 % (ref 11.5–15.5)
WBC: 10.6 10*3/uL — ABNORMAL HIGH (ref 4.0–10.5)

## 2016-09-12 LAB — PROTIME-INR
INR: 2.63
Prothrombin Time: 28.6 seconds — ABNORMAL HIGH (ref 11.4–15.2)

## 2016-09-12 LAB — MAGNESIUM: Magnesium: 1.3 mg/dL — ABNORMAL LOW (ref 1.7–2.4)

## 2016-09-12 LAB — PHOSPHORUS: Phosphorus: 3.2 mg/dL (ref 2.5–4.6)

## 2016-09-12 LAB — TSH: TSH: 1.832 u[IU]/mL (ref 0.350–4.500)

## 2016-09-12 MED ORDER — POLYETHYLENE GLYCOL 3350 17 G PO PACK
17.0000 g | PACK | Freq: Every day | ORAL | Status: DC
  Administered 2016-09-12 – 2016-09-15 (×3): 17 g via ORAL
  Filled 2016-09-12 (×4): qty 1

## 2016-09-12 MED ORDER — POTASSIUM CHLORIDE CRYS ER 20 MEQ PO TBCR
40.0000 meq | EXTENDED_RELEASE_TABLET | Freq: Once | ORAL | Status: AC
  Administered 2016-09-12: 40 meq via ORAL
  Filled 2016-09-12: qty 2

## 2016-09-12 MED ORDER — ACETAMINOPHEN 500 MG PO TABS
1000.0000 mg | ORAL_TABLET | Freq: Three times a day (TID) | ORAL | Status: DC
  Administered 2016-09-12 – 2016-09-14 (×9): 1000 mg via ORAL
  Filled 2016-09-12 (×10): qty 2

## 2016-09-12 MED ORDER — SENNA 8.6 MG PO TABS
1.0000 | ORAL_TABLET | Freq: Every day | ORAL | Status: DC
  Administered 2016-09-12 – 2016-09-13 (×2): 8.6 mg via ORAL
  Filled 2016-09-12 (×3): qty 1

## 2016-09-12 NOTE — Progress Notes (Signed)
Patient ID: [name], female   DOB: 1930-07-29, 82 y.o.   MRN: [id number]    Referring Physician(s): Dr. Jeoffrey Massed  Supervising Physician: Malachy Moan  Patient Status: [facility] - In-pt  Chief Complaint: Right paraspinous muscle hematoma, s/p VP of T11  Subjective: Patient is known to [facility] as she underwent a VP by Dr. Benard Rink on 09-03-16 secondary to a T11 compression fracture.  She has a history of a CVA and is on chronic coumadin.  This was held for her procedure with a lovenox bridge before and after.  She tolerated the procedure very well, until yesterday, she woke up around 0500am with horrible right flank/back pain.  She was unable to move or walk secondary to pain.  She was brought in by EMS and underwent an MRI.  This revealed a hematoma from T10-L2-3 levels in the right paraspinous muscles.  She has been admitted and we have been asked to see her for further recommendations.  Her coumadin has been held.   Allergies: Cozaar; Hydralazine; Hyzaar [losartan potassium-hctz]; Sulfa drugs cross reactors; and Lisinopril  Medications: Prior to Admission medications   Medication Sig Start Date End Date Taking? Authorizing Provider  alendronate (FOSAMAX) 70 MG tablet Take 1 tablet (70 mg total) by mouth every 7 (seven) days. Take with a full glass of water on an empty stomach. 05/27/16  Yes Sheliah Hatch, MD  amLODipine (NORVASC) 5 MG tablet Take 1 tablet (5 mg total) by mouth daily. 03/09/16  Yes Rosalio Macadamia, NP  atorvastatin (LIPITOR) 20 MG tablet Take 0.5 tablets (10 mg total) by mouth every morning. 09/16/15  Yes Cassell Clement, MD  diazepam (VALIUM) 2 MG tablet Take 1 tablet (2 mg total) by mouth 2 (two) times daily as needed for anxiety. 11/21/15  Yes Cassell Clement, MD  enoxaparin (LOVENOX) 60 MG/0.6ML injection Inject 0.6 mLs (60 mg total) into the skin every 12 (twelve) hours. 09/08/16  Yes Chilton Si, MD  furosemide (LASIX) 20 MG tablet Take 10 mg  by mouth as needed for fluid or edema.   Yes Historical Provider, MD  levothyroxine (SYNTHROID, LEVOTHROID) 25 MCG tablet TAKE ONE TABLET BY MOUTH ONCE DAILY BEFORE  BREAKFAST 06/10/16  Yes Rosalio Macadamia, NP  nadolol (CORGARD) 20 MG tablet Take 0.5 tablets (10 mg total) by mouth daily. 06/25/16  Yes Chilton Si, MD  nitroGLYCERIN (NITROSTAT) 0.4 MG SL tablet Place 0.4 mg under the tongue every 5 (five) minutes as needed (chest pain).    Yes Historical Provider, MD  potassium chloride (K-DUR) 10 MEQ tablet Take 1 tablet (10 mEq total) by mouth daily. 03/09/16  Yes Rosalio Macadamia, NP  tiZANidine (ZANAFLEX) 2 MG tablet TAKE ONE TABLET BY MOUTH EVERY 8 HOURS AS NEEDED FOR MUSCLE SPASM. 08/21/16  Yes Sheliah Hatch, MD  traMADol (ULTRAM) 50 MG tablet Take 50 mg by mouth. Take every 6 to 8 hours as needed for pain   Yes Historical Provider, MD  warfarin (COUMADIN) 5 MG tablet TAKE TABLET BY MOUTH AS DIRECTED BY COUMADIN CLINIC 06/24/16  Yes Chilton Si, MD  Calcium Carbonate-Vitamin D (CALCIUM 600 + D PO) Take 1 tablet by mouth daily.     Historical Provider, MD  clotrimazole-betamethasone (LOTRISONE) cream Apply 1 application topically 2 (two) times daily. Patient not taking: Reported on 09/11/2016 04/29/16   Sheliah Hatch, MD  mirabegron ER (MYRBETRIQ) 25 MG TB24 tablet Take 25 mg by mouth daily. Reported on 04/29/2016    Historical Provider,  MD    Vital Signs: BP 132/66 (BP Location: Left Arm)   Pulse 62   Temp 98.7 F (37.1 C) (Oral)   Resp 14   Ht 5\' 2"  (1.575 m)   Wt 133 lb (60.3 kg)   SpO2 96%   BMI 24.33 kg/m   Physical Exam: Gen: NAD until she tries to move Heart: regular rate, and rhythm, no M/G/R Lungs: CTAB Abd: right flank ecchymosis noted and tender to touch.  Abdomen itself is soft, NT, ND, +BS Psych: A&O x3   Imaging: Dg Thoracic Spine 2 View  Result Date: 09/11/2016 CLINICAL DATA:  Acute right-sided thoracic spine pain. No known injury. EXAM: THORACIC  SPINE 2 VIEWS COMPARISON:  MRI of August 11, 2016. FINDINGS: Atherosclerosis of thoracic aorta is noted. Mild dextroscoliosis of upper thoracic spine is noted. Status post kyphoplasty of T11. No acute fracture or spondylolisthesis is noted. IMPRESSION: Aortic atherosclerosis. Status post kyphoplasty of T11. No acute abnormality seen in thoracic spine. Electronically Signed   By: Lupita RaiderJames  Green Jr, M.D.   On: 09/11/2016 15:26   Mr Thoracic Spine Wo Contrast  Result Date: 09/11/2016 CLINICAL DATA:  Severe back pain. Status post kyphoplasty on November 16. EXAM: MRI THORACIC AND LUMBAR SPINE WITHOUT CONTRAST TECHNIQUE: Multiplanar and multiecho pulse sequences of the thoracic and lumbar spine were obtained without intravenous contrast. COMPARISON:  MRI thoracic and lumbar spine 08/11/2016 FINDINGS: MRI THORACIC SPINE FINDINGS Alignment:  Normal Vertebrae: There is again seen compression deformity of the T11 vertebral body with approximately 50% central height loss. Vertebral augmentation cement is now present. There are degenerative endplate signal changes at L1-L2. Otherwise, marrow signal is normal. No evidence of discitis osteomyelitis or epidural collection. Cord:  Normal caliber and signal. Paraspinal and other soft tissues: There is a Martinez right posterior paraspinal collection that extends from the T10 level to the L2-3 level and measures 4.4 x 3.4 cm in greatest transverse and AP dimensions. The craniocaudal extent is approximately 10 cm. Disc levels: Mild narrowing of the spinal canal at the T10-T11 level is unchanged. There is no other area of spinal canal or neural foraminal stenosis. MRI LUMBAR SPINE FINDINGS Segmentation:  Normal Alignment:  Grade 1 anterolisthesis of L4-L5 Vertebrae: Multifocal endplate degenerative signal changes. No focal marrow lesion. No discitis osteomyelitis or epidural collection. Conus medullaris: Extends to the L1 level and appears normal. Paraspinal and other soft tissues:  As above, there is a collection with multiple fluid levels within the right-sided paraspinous musculature that measures approximately 4.4 x 3.4 x 10.4 cm. Disc levels: T12-L1: Normal disc space and facet joints. No spinal canal stenosis. No neuroforaminal stenosis. L1-L2: Mild disc bulge. No spinal canal stenosis. No neuroforaminal stenosis. L2-L3: Left foraminal disc protrusion. No spinal canal stenosis. Mild left lateral recess and left neuroforaminal stenosis. L3-L4: Left eccentric disc bulge. No spinal canal stenosis. Moderate left lateral recess and left neuroforaminal stenosis. L4-L5: Diffuse disc bulge with mild narrowing of both lateral recesses. No spinal canal stenosis. Mild right neuroforaminal stenosis. L5-S1: Synovial cyst arising from the anterior aspect of the right facet joint is in close proximity to the exiting right L5 nerve root. There is bilateral fluid within the facet joints. No spinal canal stenosis. No neuroforaminal stenosis. IMPRESSION: 1. Hematoma within the right-sided paraspinous musculature extending from the T10 level to the L2-3 level, measuring 0.4 x 3.4 x 10.4 cm. 2. Status post vertebral augmentation procedure at T11 without progression of height loss. 3. No thoracic spinal canal or neural  foraminal stenosis. 4. Moderate left lateral recess and left foraminal stenosis at L3-L4. This could serve as a source for left L3 and/or L4 distribution radiculopathy. 5. Synovial cyst arising from the right L5-S1 facet, in close proximity the exiting right L5 nerve root. Correlate for right L5 radiculopathy. Electronically Signed   By: Deatra RobinsonKevin  Herman M.D.   On: 09/11/2016 17:38   Mr Lumbar Spine Wo Contrast  Result Date: 09/11/2016 CLINICAL DATA:  Severe back pain. Status post kyphoplasty on November 16. EXAM: MRI THORACIC AND LUMBAR SPINE WITHOUT CONTRAST TECHNIQUE: Multiplanar and multiecho pulse sequences of the thoracic and lumbar spine were obtained without intravenous contrast.  COMPARISON:  MRI thoracic and lumbar spine 08/11/2016 FINDINGS: MRI THORACIC SPINE FINDINGS Alignment:  Normal Vertebrae: There is again seen compression deformity of the T11 vertebral body with approximately 50% central height loss. Vertebral augmentation cement is now present. There are degenerative endplate signal changes at L1-L2. Otherwise, marrow signal is normal. No evidence of discitis osteomyelitis or epidural collection. Cord:  Normal caliber and signal. Paraspinal and other soft tissues: There is a Martinez right posterior paraspinal collection that extends from the T10 level to the L2-3 level and measures 4.4 x 3.4 cm in greatest transverse and AP dimensions. The craniocaudal extent is approximately 10 cm. Disc levels: Mild narrowing of the spinal canal at the T10-T11 level is unchanged. There is no other area of spinal canal or neural foraminal stenosis. MRI LUMBAR SPINE FINDINGS Segmentation:  Normal Alignment:  Grade 1 anterolisthesis of L4-L5 Vertebrae: Multifocal endplate degenerative signal changes. No focal marrow lesion. No discitis osteomyelitis or epidural collection. Conus medullaris: Extends to the L1 level and appears normal. Paraspinal and other soft tissues: As above, there is a collection with multiple fluid levels within the right-sided paraspinous musculature that measures approximately 4.4 x 3.4 x 10.4 cm. Disc levels: T12-L1: Normal disc space and facet joints. No spinal canal stenosis. No neuroforaminal stenosis. L1-L2: Mild disc bulge. No spinal canal stenosis. No neuroforaminal stenosis. L2-L3: Left foraminal disc protrusion. No spinal canal stenosis. Mild left lateral recess and left neuroforaminal stenosis. L3-L4: Left eccentric disc bulge. No spinal canal stenosis. Moderate left lateral recess and left neuroforaminal stenosis. L4-L5: Diffuse disc bulge with mild narrowing of both lateral recesses. No spinal canal stenosis. Mild right neuroforaminal stenosis. L5-S1: Synovial cyst  arising from the anterior aspect of the right facet joint is in close proximity to the exiting right L5 nerve root. There is bilateral fluid within the facet joints. No spinal canal stenosis. No neuroforaminal stenosis. IMPRESSION: 1. Hematoma within the right-sided paraspinous musculature extending from the T10 level to the L2-3 level, measuring 0.4 x 3.4 x 10.4 cm. 2. Status post vertebral augmentation procedure at T11 without progression of height loss. 3. No thoracic spinal canal or neural foraminal stenosis. 4. Moderate left lateral recess and left foraminal stenosis at L3-L4. This could serve as a source for left L3 and/or L4 distribution radiculopathy. 5. Synovial cyst arising from the right L5-S1 facet, in close proximity the exiting right L5 nerve root. Correlate for right L5 radiculopathy. Electronically Signed   By: Deatra RobinsonKevin  Herman M.D.   On: 09/11/2016 17:38    Labs:  CBC:  Recent Labs  01/21/16 1404 08/25/16 1534 09/11/16 1351 09/12/16 0357  WBC 7.8 7.8 9.4 10.6*  HGB 14.9 16.2* 16.2* 14.9  HCT 43.8 47.9* 47.6* 45.2  PLT 258 228 259 254    COAGS:  Recent Labs  08/31/16 1519 09/02/16 1503 09/08/16 1610 09/11/16  1351  INR 1.5 1.1 1.4 1.92    BMP:  Recent Labs  01/21/16 1404 09/11/16 1351 09/12/16 0357  NA 136 137 139  K 3.9 3.1* 3.4*  CL 100 95* 96*  CO2 26 28 31   GLUCOSE 101* 179* 153*  BUN 13 11 13   CALCIUM 8.9 8.7* 8.5*  CREATININE 1.15* 1.09* 1.11*  GFRNONAA  --  45* 44*  GFRAA  --  52* 51*    LIVER FUNCTION TESTS:  Recent Labs  01/21/16 1404 09/12/16 0357  BILITOT 1.1 0.7  AST 27 48*  ALT 16 36  ALKPHOS 96 67  PROT 6.4 6.5  ALBUMIN 4.0 3.7    Assessment and Plan: 1. Hematoma of right paraspinous muscles, s/p VP of T11 on 07-04-16, [place] -the patient's current INR is 1.92.  Her coumadin has been held.  We would recommendation to continue holding this and when her INR normalizes if she continues to have severe symptoms, then we could  attempt to aspirate this hematoma.  I did discuss with the patient and her family that blood clots and therefore, it is possible if we tried to aspirate this it may not help as we may not get much return.  If her pain improves over the next couple of days, then no intervention is necessary.  Unfortunately for most hematomas, not much can be pain except symptom control and they will usually resolve with time.  She is aware of this fact.  We will follow along.   Electronically Signed: Letha Cape 09/12/2016, 1:06 PM   I spent a total of 25 Minutes at the the patient's bedside AND on the patient's hospital floor or unit, greater than 50% of which was counseling/coordinating care for right paraspinous hematoma

## 2016-09-12 NOTE — Progress Notes (Signed)
Pt arrived to unit at 2252. Fully alert and oriented on arrival, no acute distress noted. VS completed, pt educated about belongings policy, oriented to room and unit and instructions given about call light usage.  Family at the bedside.

## 2016-09-12 NOTE — Progress Notes (Signed)
PROGRESS NOTE        PATIENT DETAILS Name: [name] Age: 82 y.o. Sex: female Date of Birth: Mar 10, 1930 Admit Date: 09/11/2016 Admitting Physician Therisa Doyne, MD WUJ:[id number] Beverely Low, MD  Brief Narrative: Patient is a 82 y.o. female with a recent history of T11 kyphoplasty on 11/16, A. fib on Coumadin, prior history of CVA without any significant residual deficits presented with sudden onset of back pain, found to have a right paraspinal hematoma. Admitted for further evaluation and treatment. See below for further details  Subjective: Back pain unchanged-unable to move freely in bed due to pain.  Assessment/Plan: Back pain secondary to right paraspinal hematoma: Recent history of T11 kyphoplasty on 11/16 by IR-spoke with interventional radiology-Dr. Malachy Moan over the phone on 11/25-recommendations are to continue with supportive care, discontinue Coumadin for now and follow INR. If pain worsens, or has any signs of a expanding hematoma, then may need to reverse INR with FFP/vitamin K.  Per Dr. Archer Asa, IR can perform aspiration early next week if needed-if INR is more acceptable. Pain is essentially unchanged compared to yesterday, continue with as needed narcotics, change to schedule Tylenol, add MiraLAX and Senokot for bowel regimen.Recheck INR today and follow clinical course.         History of atrial fibrillation: Seems to be in atrial flutter/atrial fibrillation-continue nadolol for rate control. Given right paraspinal hematoma-Coumadin on hold. Recheck INR today.CHADS2VASC score of around 5, difficult situation-patient and spouse (at bedside) aware of risk of stroke and contraindication for anticoagulation.  Recent T11 kyphoplasty for T11 compression fracture: Back pain had improved significantly following kyphoplasty-current back pain is likely secondary to right paraspinal hematoma. Continue Supportive care.   History of  atrial fibrillation: Seems to be in atrial flutter/atrial fibrillation-continue nadolol for rate control. Given right paraspinal hematoma-Coumadin on hold. Recheck INR today.CHADS2VASC score of around 5, difficult situation-patient and spouse (at bedside) aware of risk of stroke and contraindication for anticoagulation.  Chronic diastolic heart failure: Clinically compensated, continue Lasix and follow weights.  Hypertension: Controlled, continue with amlodipine and nadolol.  History of CVA: No significant residual deficits seen on exam-on Coumadin-currently on hold due to right paraspinal hematoma area  Hypokalemia: Replete and recheck.  Hypothyroidism: Continue with levothyroxine  Dyslipidemia: Continue with statin  DVT Prophylaxis: SCD's-given paraspinal hematoma  Code Status: Full code   Family Communication: Spouse at bedside  Disposition Plan: Remain inpatient-may need SNF vs HHPT on discharge.  Antimicrobial agents: None  Procedures: None  CONSULTS:  None  Time spent: 25 minutes-Greater than 50% of this time was spent in counseling, explanation of diagnosis, planning of further management, and coordination of care.  MEDICATIONS: Anti-infectives    None      Scheduled Meds: . acetaminophen  1,000 mg Oral Q8H  . amLODipine  5 mg Oral Daily  . atorvastatin  10 mg Oral q morning - 10a  .  HYDROmorphone (DILAUDID) injection  1 mg Intravenous Once  . levothyroxine  25 mcg Oral QAC breakfast  . nadolol  10 mg Oral Daily  . polyethylene glycol  17 g Oral Daily  . potassium chloride  10 mEq Oral Daily  . senna  1 tablet Oral QHS  . sodium chloride flush  3 mL Intravenous Q12H   Continuous Infusions: PRN Meds:.bisacodyl, diazepam, HYDROmorphone (DILAUDID) injection, methocarbamol (ROBAXIN)  IV, ondansetron **OR** ondansetron (  ZOFRAN) IV, oxyCODONE, traMADol   PHYSICAL EXAM: Vital signs: Vitals:   09/11/16 2215 09/11/16 2302 09/12/16 0354 09/12/16 0900  BP:  123/92 (!) 118/93 (!) 166/75 132/66  Pulse: 95 81 85 62  Resp: 14 19 20 14   Temp:  97.7 F (36.5 C) 98.2 F (36.8 C) 98.7 F (37.1 C)  TempSrc:  Oral  Oral  SpO2: 98% 95% 98% 96%  Weight:      Height:       Filed Weights   09/11/16 1332  Weight: 60.3 kg (133 lb)   Body mass index is 24.33 kg/m.   General appearance :Awake, alert, not in any distress. Speech Clear. Not toxic Looking Eyes:, pupils equally reactive to light and accomodation,no scleral icterus.Pink conjunctiva HEENT: Atraumatic and Normocephalic Neck: supple, no JVD. No cervical lymphadenopathy. No thyromegaly Resp:Good air entry bilaterally, no added sounds  CVS: S1 S2 irregular GI: Bowel sounds present, Non tender and not distended with no gaurding, rigidity or rebound.No organomegaly Extremities: B/L Lower Ext shows no edema, both legs are warm to touch Neurology:  speech clear, has approximately 5/5 strength all over-slightly limited due to pain in her going down her right lower extremity. Sensation is grossly intact.  Psychiatric: Normal judgment and insight. Alert and oriented x 3. Normal mood. Musculoskeletal:.No digital cyanosis Skin:No Rash, warm and dry Wounds:N/A  I have personally reviewed following labs and imaging studies  LABORATORY DATA: CBC:  Recent Labs Lab 09/11/16 1351 09/12/16 0357  WBC 9.4 10.6*  NEUTROABS 7.9*  --   HGB 16.2* 14.9  HCT 47.6* 45.2  MCV 95.4 97.0  PLT 259 254    Basic Metabolic Panel:  Recent Labs Lab 09/11/16 1351 09/12/16 0357  NA 137 139  K 3.1* 3.4*  CL 95* 96*  CO2 28 31  GLUCOSE 179* 153*  BUN 11 13  CREATININE 1.09* 1.11*  CALCIUM 8.7* 8.5*  MG 1.2* 1.3*  PHOS  --  3.2    GFR: Estimated Creatinine Clearance: 32.3 mL/min (by C-G formula based on SCr of 1.11 mg/dL (H)).  Liver Function Tests:  Recent Labs Lab 09/12/16 0357  AST 48*  ALT 36  ALKPHOS 67  BILITOT 0.7  PROT 6.5  ALBUMIN 3.7   No results for input(s): LIPASE, AMYLASE  in the last 168 hours. No results for input(s): AMMONIA in the last 168 hours.  Coagulation Profile:  Recent Labs Lab 09/08/16 1610 09/11/16 1351  INR 1.4 1.92    Cardiac Enzymes: No results for input(s): CKTOTAL, CKMB, CKMBINDEX, TROPONINI in the last 168 hours.  BNP (last 3 results) No results for input(s): PROBNP in the last 8760 hours.  HbA1C: No results for input(s): HGBA1C in the last 72 hours.  CBG: No results for input(s): GLUCAP in the last 168 hours.  Lipid Profile: No results for input(s): CHOL, HDL, LDLCALC, TRIG, CHOLHDL, LDLDIRECT in the last 72 hours.  Thyroid Function Tests:  Recent Labs  09/12/16 0357  TSH 1.832    Anemia Panel: No results for input(s): VITAMINB12, FOLATE, FERRITIN, TIBC, IRON, RETICCTPCT in the last 72 hours.  Urine analysis:    Component Value Date/Time   COLORURINE YELLOW 07/12/2014 1525   APPEARANCEUR CLEAR 07/12/2014 1525   LABSPEC 1.010 07/12/2014 1525   PHURINE 6.5 07/12/2014 1525   GLUCOSEU NEGATIVE 07/12/2014 1525   HGBUR NEGATIVE 07/12/2014 1525   BILIRUBINUR NEGATIVE 07/12/2014 1525   KETONESUR NEGATIVE 07/12/2014 1525   UROBILINOGEN 0.2 07/12/2014 1525   NITRITE NEGATIVE 07/12/2014 1525  LEUKOCYTESUR NEGATIVE 07/12/2014 1525    Sepsis Labs: Lactic Acid, Venous No results found for: LATICACIDVEN  MICROBIOLOGY: No results found for this or any previous visit (from the past 240 hour(s)).  RADIOLOGY STUDIES/RESULTS: Dg Thoracic Spine 2 View  Result Date: 09/11/2016 CLINICAL DATA:  Acute right-sided thoracic spine pain. No known injury. EXAM: THORACIC SPINE 2 VIEWS COMPARISON:  MRI of August 11, 2016. FINDINGS: Atherosclerosis of thoracic aorta is noted. Mild dextroscoliosis of upper thoracic spine is noted. Status post kyphoplasty of T11. No acute fracture or spondylolisthesis is noted. IMPRESSION: Aortic atherosclerosis. Status post kyphoplasty of T11. No acute abnormality seen in thoracic spine.  Electronically Signed   By: Lupita RaiderJames  Green Jr, M.D.   On: 09/11/2016 15:26   Mr Thoracic Spine Wo Contrast  Result Date: 09/11/2016 CLINICAL DATA:  Severe back pain. Status post kyphoplasty on November 16. EXAM: MRI THORACIC AND LUMBAR SPINE WITHOUT CONTRAST TECHNIQUE: Multiplanar and multiecho pulse sequences of the thoracic and lumbar spine were obtained without intravenous contrast. COMPARISON:  MRI thoracic and lumbar spine 08/11/2016 FINDINGS: MRI THORACIC SPINE FINDINGS Alignment:  Normal Vertebrae: There is again seen compression deformity of the T11 vertebral body with approximately 50% central height loss. Vertebral augmentation cement is now present. There are degenerative endplate signal changes at L1-L2. Otherwise, marrow signal is normal. No evidence of discitis osteomyelitis or epidural collection. Cord:  Normal caliber and signal. Paraspinal and other soft tissues: There is a large right posterior paraspinal collection that extends from the T10 level to the L2-3 level and measures 4.4 x 3.4 cm in greatest transverse and AP dimensions. The craniocaudal extent is approximately 10 cm. Disc levels: Mild narrowing of the spinal canal at the T10-T11 level is unchanged. There is no other area of spinal canal or neural foraminal stenosis. MRI LUMBAR SPINE FINDINGS Segmentation:  Normal Alignment:  Grade 1 anterolisthesis of L4-L5 Vertebrae: Multifocal endplate degenerative signal changes. No focal marrow lesion. No discitis osteomyelitis or epidural collection. Conus medullaris: Extends to the L1 level and appears normal. Paraspinal and other soft tissues: As above, there is a collection with multiple fluid levels within the right-sided paraspinous musculature that measures approximately 4.4 x 3.4 x 10.4 cm. Disc levels: T12-L1: Normal disc space and facet joints. No spinal canal stenosis. No neuroforaminal stenosis. L1-L2: Mild disc bulge. No spinal canal stenosis. No neuroforaminal stenosis. L2-L3: Left  foraminal disc protrusion. No spinal canal stenosis. Mild left lateral recess and left neuroforaminal stenosis. L3-L4: Left eccentric disc bulge. No spinal canal stenosis. Moderate left lateral recess and left neuroforaminal stenosis. L4-L5: Diffuse disc bulge with mild narrowing of both lateral recesses. No spinal canal stenosis. Mild right neuroforaminal stenosis. L5-S1: Synovial cyst arising from the anterior aspect of the right facet joint is in close proximity to the exiting right L5 nerve root. There is bilateral fluid within the facet joints. No spinal canal stenosis. No neuroforaminal stenosis. IMPRESSION: 1. Hematoma within the right-sided paraspinous musculature extending from the T10 level to the L2-3 level, measuring 0.4 x 3.4 x 10.4 cm. 2. Status post vertebral augmentation procedure at T11 without progression of height loss. 3. No thoracic spinal canal or neural foraminal stenosis. 4. Moderate left lateral recess and left foraminal stenosis at L3-L4. This could serve as a source for left L3 and/or L4 distribution radiculopathy. 5. Synovial cyst arising from the right L5-S1 facet, in close proximity the exiting right L5 nerve root. Correlate for right L5 radiculopathy. Electronically Signed   By: Chrisandra NettersKevin  Herman M.D.  On: 09/11/2016 17:38   Mr Lumbar Spine Wo Contrast  Result Date: 09/11/2016 CLINICAL DATA:  Severe back pain. Status post kyphoplasty on November 16. EXAM: MRI THORACIC AND LUMBAR SPINE WITHOUT CONTRAST TECHNIQUE: Multiplanar and multiecho pulse sequences of the thoracic and lumbar spine were obtained without intravenous contrast. COMPARISON:  MRI thoracic and lumbar spine 08/11/2016 FINDINGS: MRI THORACIC SPINE FINDINGS Alignment:  Normal Vertebrae: There is again seen compression deformity of the T11 vertebral body with approximately 50% central height loss. Vertebral augmentation cement is now present. There are degenerative endplate signal changes at L1-L2. Otherwise, marrow signal  is normal. No evidence of discitis osteomyelitis or epidural collection. Cord:  Normal caliber and signal. Paraspinal and other soft tissues: There is a large right posterior paraspinal collection that extends from the T10 level to the L2-3 level and measures 4.4 x 3.4 cm in greatest transverse and AP dimensions. The craniocaudal extent is approximately 10 cm. Disc levels: Mild narrowing of the spinal canal at the T10-T11 level is unchanged. There is no other area of spinal canal or neural foraminal stenosis. MRI LUMBAR SPINE FINDINGS Segmentation:  Normal Alignment:  Grade 1 anterolisthesis of L4-L5 Vertebrae: Multifocal endplate degenerative signal changes. No focal marrow lesion. No discitis osteomyelitis or epidural collection. Conus medullaris: Extends to the L1 level and appears normal. Paraspinal and other soft tissues: As above, there is a collection with multiple fluid levels within the right-sided paraspinous musculature that measures approximately 4.4 x 3.4 x 10.4 cm. Disc levels: T12-L1: Normal disc space and facet joints. No spinal canal stenosis. No neuroforaminal stenosis. L1-L2: Mild disc bulge. No spinal canal stenosis. No neuroforaminal stenosis. L2-L3: Left foraminal disc protrusion. No spinal canal stenosis. Mild left lateral recess and left neuroforaminal stenosis. L3-L4: Left eccentric disc bulge. No spinal canal stenosis. Moderate left lateral recess and left neuroforaminal stenosis. L4-L5: Diffuse disc bulge with mild narrowing of both lateral recesses. No spinal canal stenosis. Mild right neuroforaminal stenosis. L5-S1: Synovial cyst arising from the anterior aspect of the right facet joint is in close proximity to the exiting right L5 nerve root. There is bilateral fluid within the facet joints. No spinal canal stenosis. No neuroforaminal stenosis. IMPRESSION: 1. Hematoma within the right-sided paraspinous musculature extending from the T10 level to the L2-3 level, measuring 0.4 x 3.4 x  10.4 cm. 2. Status post vertebral augmentation procedure at T11 without progression of height loss. 3. No thoracic spinal canal or neural foraminal stenosis. 4. Moderate left lateral recess and left foraminal stenosis at L3-L4. This could serve as a source for left L3 and/or L4 distribution radiculopathy. 5. Synovial cyst arising from the right L5-S1 facet, in close proximity the exiting right L5 nerve root. Correlate for right L5 radiculopathy. Electronically Signed   By: Deatra RobinsonKevin  Herman M.D.   On: 09/11/2016 17:38   Dg Radiologist Eval And Mgmt  Result Date: 08/27/2016 : Please see full consultation note in Epic. Electronically Signed   By: Marin Robertshristopher  Mattern M.D.   On: 08/27/2016 17:35   Dg Epidural Veno/verte Bropl  Result Date: 09/03/2016 INDICATION: Painful T11 compression fracture. EXAM: T11 VERTEBRAL AUGMENTATION VIA VERTEBROPLASTY TECHNIQUE. COMPARISON:  NONE. MEDICATIONS: As antibiotic prophylaxis, and staff was ordered pre-procedure and administered intravenously within 1 hour of incision. ANESTHESIA/SEDATION: Moderate (conscious) sedation was employed during this procedure. A total of Versed 1 mg and Fentanyl 75 mcg was administered intravenously. Moderate Sedation Time: 25 minutes. The patient's level of consciousness and vital signs were monitored continuously by radiology nursing throughout  the procedure under my direct supervision. FLUOROSCOPY TIME:  Fluoroscopy Time: 2 minutes 44 seconds corresponding to a Dose Area Product of 211.12 Gy*m2 COMPLICATIONS: None immediate. TECHNIQUE: Informed written consent was obtained from the patient after a thorough discussion of the procedural risks, benefits and alternatives. All questions were addressed. Maximal Sterile Barrier Technique was utilized including caps, mask, sterile gowns, sterile gloves, sterile drape, hand hygiene and skin antiseptic. A timeout was performed prior to the initiation of the procedure. 13 gauge Stryker needles were placed  on the RIGHT and LEFT T11 pedicles, and advanced through the ventral 1/3 of the T11 vertebral body under intermittent fluoroscopic guidance. Methylmethacrylate was reconstituted using a automatic mixture, and injected slowly under intermittent fluoroscopy to allow filling of both the RIGHT and LEFT vertebral body, particularly along the superior endplate compression. FINDINGS: The final postprocedural films demonstrate good filling of the vertebral body. There was filling of a small paravertebral vein on the LEFT, but no venous migration. IMPRESSION: 1. Technically successful T11 vertebral augmentation using vertebroplasty technique. Per CMS PQRS reporting requirements (PQRS Measure 24): Given the patient's age of greater than 50 and the fracture site ( spine), the patient should be tested for osteoporosis using DXA, and the appropriate treatment considered based on the DXA results. Electronically Signed   By: Elsie Stain M.D.   On: 09/03/2016 10:02     LOS: 0 days   Jeoffrey Massed, MD  [facility] Pager:[phone]  If 7PM-7AM, please contact night-coverage [email or web address] Password [facility] 09/12/2016, 12:48 PM

## 2016-09-13 LAB — CBC
HCT: 40.9 % (ref 36.0–46.0)
Hemoglobin: 13.5 g/dL (ref 12.0–15.0)
MCH: 32.4 pg (ref 26.0–34.0)
MCHC: 33 g/dL (ref 30.0–36.0)
MCV: 98.1 fL (ref 78.0–100.0)
Platelets: 214 10*3/uL (ref 150–400)
RBC: 4.17 MIL/uL (ref 3.87–5.11)
RDW: 13.9 % (ref 11.5–15.5)
WBC: 11.1 10*3/uL — ABNORMAL HIGH (ref 4.0–10.5)

## 2016-09-13 LAB — BASIC METABOLIC PANEL
Anion gap: 8 (ref 5–15)
BUN: 15 mg/dL (ref 6–20)
CO2: 28 mmol/L (ref 22–32)
Calcium: 8.3 mg/dL — ABNORMAL LOW (ref 8.9–10.3)
Chloride: 101 mmol/L (ref 101–111)
Creatinine, Ser: 1.14 mg/dL — ABNORMAL HIGH (ref 0.44–1.00)
GFR calc Af Amer: 50 mL/min — ABNORMAL LOW (ref >60)
GFR calc non Af Amer: 43 mL/min — ABNORMAL LOW (ref >60)
Glucose, Bld: 112 mg/dL — ABNORMAL HIGH (ref 65–99)
Potassium: 4.5 mmol/L (ref 3.5–5.1)
Sodium: 137 mmol/L (ref 135–145)

## 2016-09-13 LAB — PROTIME-INR
INR: 1.23
Prothrombin Time: 15.6 seconds — ABNORMAL HIGH (ref 11.4–15.2)

## 2016-09-13 NOTE — Progress Notes (Signed)
PROGRESS NOTE        PATIENT DETAILS Name: [name] Age: 82 y.o. Sex: female Date of Birth: 07/03/1930 Admit Date: 09/11/2016 Admitting Physician Therisa Doyne, MD ZOX:[id number] Beverely Low, MD  Brief Narrative: Patient is a 82 y.o. female with a recent history of T11 kyphoplasty on 11/16, A. fib on Coumadin, prior history of CVA without any significant residual deficits presented with sudden onset of back pain, found to have a right paraspinal hematoma. Admitted for further evaluation and treatment. See below for further details  Subjective: Back pain essentially the same-but seems to be able to move around much more freely in bed today.  Assessment/Plan: Back pain secondary to right paraspinal hematoma: Recent history of T11 kyphoplasty on 11/16 by IR-on anticoagulation with Coumadin-continue supportive care, physical exam/bloodwork do not indicate any worsening of hematoma at this time. IR following, tentative plans are for aspiration of the hematoma to decrease pressure affecting pain if her symptoms continue. Attempt to mobilize with physical therapy.  Persistent atrial fibrillation: Telemetry contineus to show atrial flutter/atrial fibrillation-continue nadolol for rate control. Given right paraspinal hematoma-Coumadin on hold-and now INR less than 1.5. CHADS2VASC score of around 5, difficult situation-patient and spouse (at bedside) aware of risk of stroke and contraindication for anticoagulation. Once okay with interventional radiology, suspect could resume anticoagulation as few days-? Switch to Eliquis discharge-patient will take about it and let us know.  Recent T11 kyphoplasty for T11 compression fracture: Back pain had improved significantly following kyphoplasty-current back pain is likely secondary to right paraspinal hematoma. Continue Supportive care.   Chronic diastolic heart failure: Clinically compensated, continue Lasix and follow  weights.  Hypertension: Controlled, continue with amlodipine and nadolol.  History of CVA: No significant residual deficits seen on exam-on Coumadin-currently on hold due to right paraspinal hematoma area  Hypokalemia: Repleted  Hypothyroidism: Continue with levothyroxine  Dyslipidemia: Continue with statin  DVT Prophylaxis: SCD's-given paraspinal hematoma  Code Status: Full code   Family Communication: None at bedside-spoken with spouse yesterday.  Disposition Plan: Remain inpatient-may need SNF vs HHPT on discharge.  Antimicrobial agents: None  Procedures: None  CONSULTS:  None  Time spent: 25 minutes-Greater than 50% of this time was spent in counseling, explanation of diagnosis, planning of further management, and coordination of care.  MEDICATIONS: Anti-infectives    None      Scheduled Meds: . acetaminophen  1,000 mg Oral Q8H  . amLODipine  5 mg Oral Daily  . atorvastatin  10 mg Oral q morning - 10a  .  HYDROmorphone (DILAUDID) injection  1 mg Intravenous Once  . levothyroxine  25 mcg Oral QAC breakfast  . nadolol  10 mg Oral Daily  . polyethylene glycol  17 g Oral Daily  . potassium chloride  10 mEq Oral Daily  . senna  1 tablet Oral QHS  . sodium chloride flush  3 mL Intravenous Q12H   Continuous Infusions: PRN Meds:.bisacodyl, diazepam, HYDROmorphone (DILAUDID) injection, methocarbamol (ROBAXIN)  IV, ondansetron **OR** ondansetron (ZOFRAN) IV, oxyCODONE, traMADol   PHYSICAL EXAM: Vital signs: Vitals:   09/12/16 2107 09/13/16 0248 09/13/16 0540 09/13/16 0540  BP: (!) 108/43 117/64 118/75 118/75  Pulse: 87 78 84 84  Resp: 18 18 18 18   Temp: 98.9 F (37.2 C) 97.7 F (36.5 C) 98.5 F (36.9 C) 98.5 F (36.9 C)  TempSrc: Oral Oral Oral Oral  SpO2: 97% 98% 95% 95%  Weight:      Height:       Filed Weights   09/11/16 1332  Weight: 60.3 kg (133 lb)   Body mass index is 24.33 kg/m.   General appearance :Awake, alert, not in any  distress. Speech Clear. Not toxic Looking Eyes:, pupils equally reactive to light and accomodation,no scleral icterus.Pink conjunctiva HEENT: Atraumatic and Normocephalic Neck: supple, no JVD. No cervical lymphadenopathy. No thyromegaly Resp:Good air entry bilaterally, no added sounds  CVS: S1 S2 irregular GI: Bowel sounds present, Non tender and not distended with no gaurding, rigidity or rebound.No organomegaly Extremities: B/L Lower Ext shows no edema, both legs are warm to touch Neurology:  speech clear, has approximately 5/5 strength all over-slightly limited due to pain in her going down her right lower extremity. Sensation is grossly intact.  Psychiatric: Normal judgment and insight. Alert and oriented x 3. Normal mood. Musculoskeletal:.No digital cyanosis Skin:No Rash, warm and dry-She does have a area of ecchymosis in her right flank. Wounds:N/A  I have personally reviewed following labs and imaging studies  LABORATORY DATA: CBC:  Recent Labs Lab 09/11/16 1351 09/12/16 0357 09/13/16 0734  WBC 9.4 10.6* 11.1*  NEUTROABS 7.9*  --   --   HGB 16.2* 14.9 13.5  HCT 47.6* 45.2 40.9  MCV 95.4 97.0 98.1  PLT 259 254 214    Basic Metabolic Panel:  Recent Labs Lab 09/11/16 1351 09/12/16 0357 09/13/16 0734  NA 137 139 137  K 3.1* 3.4* 4.5  CL 95* 96* 101  CO2 28 31 28   GLUCOSE 179* 153* 112*  BUN 11 13 15   CREATININE 1.09* 1.11* 1.14*  CALCIUM 8.7* 8.5* 8.3*  MG 1.2* 1.3*  --   PHOS  --  3.2  --     GFR: Estimated Creatinine Clearance: 31.4 mL/min (by C-G formula based on SCr of 1.14 mg/dL (H)).  Liver Function Tests:  Recent Labs Lab 09/12/16 0357  AST 48*  ALT 36  ALKPHOS 67  BILITOT 0.7  PROT 6.5  ALBUMIN 3.7   No results for input(s): LIPASE, AMYLASE in the last 168 hours. No results for input(s): AMMONIA in the last 168 hours.  Coagulation Profile:  Recent Labs Lab 09/08/16 1610 09/11/16 1351 09/12/16 1358 09/13/16 0734  INR 1.4 1.92 2.63  1.23    Cardiac Enzymes: No results for input(s): CKTOTAL, CKMB, CKMBINDEX, TROPONINI in the last 168 hours.  BNP (last 3 results) No results for input(s): PROBNP in the last 8760 hours.  HbA1C: No results for input(s): HGBA1C in the last 72 hours.  CBG: No results for input(s): GLUCAP in the last 168 hours.  Lipid Profile: No results for input(s): CHOL, HDL, LDLCALC, TRIG, CHOLHDL, LDLDIRECT in the last 72 hours.  Thyroid Function Tests:  Recent Labs  09/12/16 0357  TSH 1.832    Anemia Panel: No results for input(s): VITAMINB12, FOLATE, FERRITIN, TIBC, IRON, RETICCTPCT in the last 72 hours.  Urine analysis:    Component Value Date/Time   COLORURINE YELLOW 07/12/2014 1525   APPEARANCEUR CLEAR 07/12/2014 1525   LABSPEC 1.010 07/12/2014 1525   PHURINE 6.5 07/12/2014 1525   GLUCOSEU NEGATIVE 07/12/2014 1525   HGBUR NEGATIVE 07/12/2014 1525   BILIRUBINUR NEGATIVE 07/12/2014 1525   KETONESUR NEGATIVE 07/12/2014 1525   UROBILINOGEN 0.2 07/12/2014 1525   NITRITE NEGATIVE 07/12/2014 1525   LEUKOCYTESUR NEGATIVE 07/12/2014 1525    Sepsis Labs: Lactic Acid, Venous No results found for: LATICACIDVEN  MICROBIOLOGY: No results  found for this or any previous visit (from the past 240 hour(s)).  RADIOLOGY STUDIES/RESULTS: Dg Thoracic Spine 2 View  Result Date: 09/11/2016 CLINICAL DATA:  Acute right-sided thoracic spine pain. No known injury. EXAM: THORACIC SPINE 2 VIEWS COMPARISON:  MRI of August 11, 2016. FINDINGS: Atherosclerosis of thoracic aorta is noted. Mild dextroscoliosis of upper thoracic spine is noted. Status post kyphoplasty of T11. No acute fracture or spondylolisthesis is noted. IMPRESSION: Aortic atherosclerosis. Status post kyphoplasty of T11. No acute abnormality seen in thoracic spine. Electronically Signed   By: Lupita RaiderJames  Green Jr, M.D.   On: 09/11/2016 15:26   Mr Thoracic Spine Wo Contrast  Result Date: 09/11/2016 CLINICAL DATA:  Severe back pain. Status  post kyphoplasty on November 16. EXAM: MRI THORACIC AND LUMBAR SPINE WITHOUT CONTRAST TECHNIQUE: Multiplanar and multiecho pulse sequences of the thoracic and lumbar spine were obtained without intravenous contrast. COMPARISON:  MRI thoracic and lumbar spine 08/11/2016 FINDINGS: MRI THORACIC SPINE FINDINGS Alignment:  Normal Vertebrae: There is again seen compression deformity of the T11 vertebral body with approximately 50% central height loss. Vertebral augmentation cement is now present. There are degenerative endplate signal changes at L1-L2. Otherwise, marrow signal is normal. No evidence of discitis osteomyelitis or epidural collection. Cord:  Normal caliber and signal. Paraspinal and other soft tissues: There is a large right posterior paraspinal collection that extends from the T10 level to the L2-3 level and measures 4.4 x 3.4 cm in greatest transverse and AP dimensions. The craniocaudal extent is approximately 10 cm. Disc levels: Mild narrowing of the spinal canal at the T10-T11 level is unchanged. There is no other area of spinal canal or neural foraminal stenosis. MRI LUMBAR SPINE FINDINGS Segmentation:  Normal Alignment:  Grade 1 anterolisthesis of L4-L5 Vertebrae: Multifocal endplate degenerative signal changes. No focal marrow lesion. No discitis osteomyelitis or epidural collection. Conus medullaris: Extends to the L1 level and appears normal. Paraspinal and other soft tissues: As above, there is a collection with multiple fluid levels within the right-sided paraspinous musculature that measures approximately 4.4 x 3.4 x 10.4 cm. Disc levels: T12-L1: Normal disc space and facet joints. No spinal canal stenosis. No neuroforaminal stenosis. L1-L2: Mild disc bulge. No spinal canal stenosis. No neuroforaminal stenosis. L2-L3: Left foraminal disc protrusion. No spinal canal stenosis. Mild left lateral recess and left neuroforaminal stenosis. L3-L4: Left eccentric disc bulge. No spinal canal stenosis.  Moderate left lateral recess and left neuroforaminal stenosis. L4-L5: Diffuse disc bulge with mild narrowing of both lateral recesses. No spinal canal stenosis. Mild right neuroforaminal stenosis. L5-S1: Synovial cyst arising from the anterior aspect of the right facet joint is in close proximity to the exiting right L5 nerve root. There is bilateral fluid within the facet joints. No spinal canal stenosis. No neuroforaminal stenosis. IMPRESSION: 1. Hematoma within the right-sided paraspinous musculature extending from the T10 level to the L2-3 level, measuring 0.4 x 3.4 x 10.4 cm. 2. Status post vertebral augmentation procedure at T11 without progression of height loss. 3. No thoracic spinal canal or neural foraminal stenosis. 4. Moderate left lateral recess and left foraminal stenosis at L3-L4. This could serve as a source for left L3 and/or L4 distribution radiculopathy. 5. Synovial cyst arising from the right L5-S1 facet, in close proximity the exiting right L5 nerve root. Correlate for right L5 radiculopathy. Electronically Signed   By: Deatra RobinsonKevin  Herman M.D.   On: 09/11/2016 17:38   Mr Lumbar Spine Wo Contrast  Result Date: 09/11/2016 CLINICAL DATA:  Severe back  pain. Status post kyphoplasty on November 16. EXAM: MRI THORACIC AND LUMBAR SPINE WITHOUT CONTRAST TECHNIQUE: Multiplanar and multiecho pulse sequences of the thoracic and lumbar spine were obtained without intravenous contrast. COMPARISON:  MRI thoracic and lumbar spine 08/11/2016 FINDINGS: MRI THORACIC SPINE FINDINGS Alignment:  Normal Vertebrae: There is again seen compression deformity of the T11 vertebral body with approximately 50% central height loss. Vertebral augmentation cement is now present. There are degenerative endplate signal changes at L1-L2. Otherwise, marrow signal is normal. No evidence of discitis osteomyelitis or epidural collection. Cord:  Normal caliber and signal. Paraspinal and other soft tissues: There is a large right  posterior paraspinal collection that extends from the T10 level to the L2-3 level and measures 4.4 x 3.4 cm in greatest transverse and AP dimensions. The craniocaudal extent is approximately 10 cm. Disc levels: Mild narrowing of the spinal canal at the T10-T11 level is unchanged. There is no other area of spinal canal or neural foraminal stenosis. MRI LUMBAR SPINE FINDINGS Segmentation:  Normal Alignment:  Grade 1 anterolisthesis of L4-L5 Vertebrae: Multifocal endplate degenerative signal changes. No focal marrow lesion. No discitis osteomyelitis or epidural collection. Conus medullaris: Extends to the L1 level and appears normal. Paraspinal and other soft tissues: As above, there is a collection with multiple fluid levels within the right-sided paraspinous musculature that measures approximately 4.4 x 3.4 x 10.4 cm. Disc levels: T12-L1: Normal disc space and facet joints. No spinal canal stenosis. No neuroforaminal stenosis. L1-L2: Mild disc bulge. No spinal canal stenosis. No neuroforaminal stenosis. L2-L3: Left foraminal disc protrusion. No spinal canal stenosis. Mild left lateral recess and left neuroforaminal stenosis. L3-L4: Left eccentric disc bulge. No spinal canal stenosis. Moderate left lateral recess and left neuroforaminal stenosis. L4-L5: Diffuse disc bulge with mild narrowing of both lateral recesses. No spinal canal stenosis. Mild right neuroforaminal stenosis. L5-S1: Synovial cyst arising from the anterior aspect of the right facet joint is in close proximity to the exiting right L5 nerve root. There is bilateral fluid within the facet joints. No spinal canal stenosis. No neuroforaminal stenosis. IMPRESSION: 1. Hematoma within the right-sided paraspinous musculature extending from the T10 level to the L2-3 level, measuring 0.4 x 3.4 x 10.4 cm. 2. Status post vertebral augmentation procedure at T11 without progression of height loss. 3. No thoracic spinal canal or neural foraminal stenosis. 4.  Moderate left lateral recess and left foraminal stenosis at L3-L4. This could serve as a source for left L3 and/or L4 distribution radiculopathy. 5. Synovial cyst arising from the right L5-S1 facet, in close proximity the exiting right L5 nerve root. Correlate for right L5 radiculopathy. Electronically Signed   By: Deatra Robinson M.D.   On: 09/11/2016 17:38   Dg Radiologist Eval And Mgmt  Result Date: 08/27/2016 : Please see full consultation note in Epic. Electronically Signed   By: Marin Roberts M.D.   On: 08/27/2016 17:35   Dg Epidural Veno/verte Bropl  Result Date: 09/03/2016 INDICATION: Painful T11 compression fracture. EXAM: T11 VERTEBRAL AUGMENTATION VIA VERTEBROPLASTY TECHNIQUE. COMPARISON:  NONE. MEDICATIONS: As antibiotic prophylaxis, and staff was ordered pre-procedure and administered intravenously within 1 hour of incision. ANESTHESIA/SEDATION: Moderate (conscious) sedation was employed during this procedure. A total of Versed 1 mg and Fentanyl 75 mcg was administered intravenously. Moderate Sedation Time: 25 minutes. The patient's level of consciousness and vital signs were monitored continuously by radiology nursing throughout the procedure under my direct supervision. FLUOROSCOPY TIME:  Fluoroscopy Time: 2 minutes 44 seconds corresponding to a Dose  Area Product of 211.12 Gy*m2 COMPLICATIONS: None immediate. TECHNIQUE: Informed written consent was obtained from the patient after a thorough discussion of the procedural risks, benefits and alternatives. All questions were addressed. Maximal Sterile Barrier Technique was utilized including caps, mask, sterile gowns, sterile gloves, sterile drape, hand hygiene and skin antiseptic. A timeout was performed prior to the initiation of the procedure. 13 gauge Stryker needles were placed on the RIGHT and LEFT T11 pedicles, and advanced through the ventral 1/3 of the T11 vertebral body under intermittent fluoroscopic guidance. Methylmethacrylate  was reconstituted using a automatic mixture, and injected slowly under intermittent fluoroscopy to allow filling of both the RIGHT and LEFT vertebral body, particularly along the superior endplate compression. FINDINGS: The final postprocedural films demonstrate good filling of the vertebral body. There was filling of a small paravertebral vein on the LEFT, but no venous migration. IMPRESSION: 1. Technically successful T11 vertebral augmentation using vertebroplasty technique. Per CMS PQRS reporting requirements (PQRS Measure 24): Given the patient's age of greater than 50 and the fracture site ( spine), the patient should be tested for osteoporosis using DXA, and the appropriate treatment considered based on the DXA results. Electronically Signed   By: Elsie StainJohn T Curnes M.D.   On: 09/03/2016 10:02     LOS: 1 day   Jeoffrey MassedGHIMIRE,Xyler Terpening, MD  [facility] Pager:[phone]  If 7PM-7AM, please contact night-coverage [email or web address] Password [email or web address] 09/13/2016, 11:03 AM

## 2016-09-14 DIAGNOSIS — S22000A Wedge compression fracture of unspecified thoracic vertebra, initial encounter for closed fracture: Secondary | ICD-10-CM | POA: Diagnosis present

## 2016-09-14 DIAGNOSIS — T148XXA Other injury of unspecified body region, initial encounter: Secondary | ICD-10-CM

## 2016-09-14 DIAGNOSIS — Z9889 Other specified postprocedural states: Secondary | ICD-10-CM

## 2016-09-14 DIAGNOSIS — M549 Dorsalgia, unspecified: Secondary | ICD-10-CM | POA: Diagnosis present

## 2016-09-14 DIAGNOSIS — Z8673 Personal history of transient ischemic attack (TIA), and cerebral infarction without residual deficits: Secondary | ICD-10-CM

## 2016-09-14 DIAGNOSIS — I10 Essential (primary) hypertension: Secondary | ICD-10-CM | POA: Diagnosis present

## 2016-09-14 DIAGNOSIS — I5032 Chronic diastolic (congestive) heart failure: Secondary | ICD-10-CM | POA: Diagnosis present

## 2016-09-14 MED ORDER — TRAMADOL HCL 50 MG PO TABS
50.0000 mg | ORAL_TABLET | Freq: Four times a day (QID) | ORAL | Status: DC
  Administered 2016-09-14 – 2016-09-15 (×3): 50 mg via ORAL
  Filled 2016-09-14 (×3): qty 1

## 2016-09-14 MED ORDER — METHOCARBAMOL 500 MG PO TABS
500.0000 mg | ORAL_TABLET | Freq: Three times a day (TID) | ORAL | Status: DC
  Administered 2016-09-14 – 2016-09-15 (×3): 500 mg via ORAL
  Filled 2016-09-14 (×3): qty 1

## 2016-09-14 MED ORDER — OXYCODONE HCL 5 MG PO TABS
5.0000 mg | ORAL_TABLET | ORAL | Status: DC
  Administered 2016-09-14 – 2016-09-15 (×5): 5 mg via ORAL
  Filled 2016-09-14 (×5): qty 1

## 2016-09-14 NOTE — Progress Notes (Signed)
Referring Physician(s): Dr Windell NorfolkS Ghimire  Supervising Physician: Dr Gilmer MorJaime Wagner  [facility] Status:  [facility]  Chief Complaint:  Pt underwent T11 Vertebroplasty 09/03/16 Post hematoma and back pain  Subjective:  Pt did well after procedure for over 1 week Back pain had diminished greatly. Developed back pain 11/24; severe enough to merit hospitalization MR 11/24: IMPRESSION: 1. Hematoma within the right-sided paraspinous musculature extending from the T10 level to the L2-3 level, measuring 0.4 x 3.4 x 10.4 cm. 2. Status post vertebral augmentation procedure at T11 without progression of height loss. 3. No thoracic spinal canal or neural foraminal stenosis. 4. Moderate left lateral recess and left foraminal stenosis at L3-L4. This could serve as a source for left L3 and/or L4 distribution radiculopathy. 5. Synovial cyst arising from the right L5-S1 facet, in close proximity the exiting right L5 nerve root. Correlate for right L5 radiculopathy.  Pt now off coumadin with INR 1.2 today IR had seen pt yesterday:  Agree with PA note below.  If sx persist once INR has normalized we can attempt to aspirate as much as we can from the hematoma in an effort to decrease the pressure and local mass effect.  Signed, Sterling BigHeath K. McCullough, MD  Pt states pain has decreased some today. Plans to try PT today. Hematoma still apparent Rt flank/back   Allergies: Cozaar; Hydralazine; Hyzaar [losartan potassium-hctz]; Sulfa drugs cross reactors; and Lisinopril  Medications: Prior to Admission medications   Medication Sig Start Date End Date Taking? Authorizing Provider  alendronate (FOSAMAX) 70 MG tablet Take 1 tablet (70 mg total) by mouth every 7 (seven) days. Take with a full glass of water on an empty stomach. 05/27/16  Yes Sheliah HatchKatherine E Tabori, MD  amLODipine (NORVASC) 5 MG tablet Take 1 tablet (5 mg total) by mouth daily. 03/09/16  Yes Rosalio MacadamiaLori C Gerhardt, NP  atorvastatin (LIPITOR) 20 MG  tablet Take 0.5 tablets (10 mg total) by mouth every morning. 09/16/15  Yes Cassell Clementhomas Brackbill, MD  diazepam (VALIUM) 2 MG tablet Take 1 tablet (2 mg total) by mouth 2 (two) times daily as needed for anxiety. 11/21/15  Yes Cassell Clementhomas Brackbill, MD  enoxaparin (LOVENOX) 60 MG/0.6ML injection Inject 0.6 mLs (60 mg total) into the skin every 12 (twelve) hours. 09/08/16  Yes Chilton Siiffany Raemon, MD  furosemide (LASIX) 20 MG tablet Take 10 mg by mouth as needed for fluid or edema.   Yes Historical Provider, MD  levothyroxine (SYNTHROID, LEVOTHROID) 25 MCG tablet TAKE ONE TABLET BY MOUTH ONCE DAILY BEFORE  BREAKFAST 06/10/16  Yes Rosalio MacadamiaLori C Gerhardt, NP  nadolol (CORGARD) 20 MG tablet Take 0.5 tablets (10 mg total) by mouth daily. 06/25/16  Yes Chilton Siiffany [place]pitas, MD  nitroGLYCERIN (NITROSTAT) 0.4 MG SL tablet Place 0.4 mg under the tongue every 5 (five) minutes as needed (chest pain).    Yes Historical Provider, MD  potassium chloride (K-DUR) 10 MEQ tablet Take 1 tablet (10 mEq total) by mouth daily. 03/09/16  Yes Rosalio MacadamiaLori C Gerhardt, NP  tiZANidine (ZANAFLEX) 2 MG tablet TAKE ONE TABLET BY MOUTH EVERY 8 HOURS AS NEEDED FOR MUSCLE SPASM. 08/21/16  Yes Sheliah HatchKatherine E Tabori, MD  traMADol (ULTRAM) 50 MG tablet Take 50 mg by mouth. Take every 6 to 8 hours as needed for pain   Yes Historical Provider, MD  warfarin (COUMADIN) 5 MG tablet TAKE TABLET BY MOUTH AS DIRECTED BY COUMADIN CLINIC 06/24/16  Yes Chilton Siiffany Travilah, MD  Calcium Carbonate-Vitamin D (CALCIUM 600 + D PO) Take 1  tablet by mouth daily.     Historical Provider, MD  clotrimazole-betamethasone (LOTRISONE) cream Apply 1 application topically 2 (two) times daily. Patient not taking: Reported on 09/11/2016 04/29/16   Sheliah HatchKatherine E Tabori, MD  mirabegron ER (MYRBETRIQ) 25 MG TB24 tablet Take 25 mg by mouth daily. Reported on 04/29/2016    Historical Provider, MD     Vital Signs: BP 140/80 (BP Location: Right Leg)   Pulse 79   Temp 98.1 F (36.7 C) (Oral)   Resp 16   Ht 5\' 2"   (1.575 m)   Wt 140 lb (63.5 kg)   SpO2 99%   BMI 25.61 kg/m   Physical Exam  Constitutional: She is oriented to person, place, and time.  Abdominal: Soft. Bowel sounds are normal. There is no tenderness.  Musculoskeletal: Normal range of motion.  Neurological: She is alert and oriented to person, place, and time.  Skin: Skin is warm.  Rt side hematoma apparent. Markings of palpable firmness of hematoma are unchanged Minimal to moderate tenderness Bruising does extend outside markings ---but expected natural course of bruising to follow dependency  Psychiatric: She has a normal mood and affect. Her behavior is normal.  Nursing note and vitals reviewed.   Imaging: Dg Thoracic Spine 2 View  Result Date: 09/11/2016 CLINICAL DATA:  Acute right-sided thoracic spine pain. No known injury. EXAM: THORACIC SPINE 2 VIEWS COMPARISON:  MRI of August 11, 2016. FINDINGS: Atherosclerosis of thoracic aorta is noted. Mild dextroscoliosis of upper thoracic spine is noted. Status post kyphoplasty of T11. No acute fracture or spondylolisthesis is noted. IMPRESSION: Aortic atherosclerosis. Status post kyphoplasty of T11. No acute abnormality seen in thoracic spine. Electronically Signed   By: Lupita RaiderJames  Green Jr, M.D.   On: 09/11/2016 15:26   Mr Thoracic Spine Wo Contrast  Result Date: 09/11/2016 CLINICAL DATA:  Severe back pain. Status post kyphoplasty on November 16. EXAM: MRI THORACIC AND LUMBAR SPINE WITHOUT CONTRAST TECHNIQUE: Multiplanar and multiecho pulse sequences of the thoracic and lumbar spine were obtained without intravenous contrast. COMPARISON:  MRI thoracic and lumbar spine 08/11/2016 FINDINGS: MRI THORACIC SPINE FINDINGS Alignment:  Normal Vertebrae: There is again seen compression deformity of the T11 vertebral body with approximately 50% central height loss. Vertebral augmentation cement is now present. There are degenerative endplate signal changes at L1-L2. Otherwise, marrow signal is  normal. No evidence of discitis osteomyelitis or epidural collection. Cord:  Normal caliber and signal. Paraspinal and other soft tissues: There is a large right posterior paraspinal collection that extends from the T10 level to the L2-3 level and measures 4.4 x 3.4 cm in greatest transverse and AP dimensions. The craniocaudal extent is approximately 10 cm. Disc levels: Mild narrowing of the spinal canal at the T10-T11 level is unchanged. There is no other area of spinal canal or neural foraminal stenosis. MRI LUMBAR SPINE FINDINGS Segmentation:  Normal Alignment:  Grade 1 anterolisthesis of L4-L5 Vertebrae: Multifocal endplate degenerative signal changes. No focal marrow lesion. No discitis osteomyelitis or epidural collection. Conus medullaris: Extends to the L1 level and appears normal. Paraspinal and other soft tissues: As above, there is a collection with multiple fluid levels within the right-sided paraspinous musculature that measures approximately 4.4 x 3.4 x 10.4 cm. Disc levels: T12-L1: Normal disc space and facet joints. No spinal canal stenosis. No neuroforaminal stenosis. L1-L2: Mild disc bulge. No spinal canal stenosis. No neuroforaminal stenosis. L2-L3: Left foraminal disc protrusion. No spinal canal stenosis. Mild left lateral recess and left neuroforaminal stenosis. L3-L4: Left  eccentric disc bulge. No spinal canal stenosis. Moderate left lateral recess and left neuroforaminal stenosis. L4-L5: Diffuse disc bulge with mild narrowing of both lateral recesses. No spinal canal stenosis. Mild right neuroforaminal stenosis. L5-S1: Synovial cyst arising from the anterior aspect of the right facet joint is in close proximity to the exiting right L5 nerve root. There is bilateral fluid within the facet joints. No spinal canal stenosis. No neuroforaminal stenosis. IMPRESSION: 1. Hematoma within the right-sided paraspinous musculature extending from the T10 level to the L2-3 level, measuring 0.4 x 3.4 x 10.4  cm. 2. Status post vertebral augmentation procedure at T11 without progression of height loss. 3. No thoracic spinal canal or neural foraminal stenosis. 4. Moderate left lateral recess and left foraminal stenosis at L3-L4. This could serve as a source for left L3 and/or L4 distribution radiculopathy. 5. Synovial cyst arising from the right L5-S1 facet, in close proximity the exiting right L5 nerve root. Correlate for right L5 radiculopathy. Electronically Signed   By: Deatra Robinson M.D.   On: 09/11/2016 17:38   Mr Lumbar Spine Wo Contrast  Result Date: 09/11/2016 CLINICAL DATA:  Severe back pain. Status post kyphoplasty on November 16. EXAM: MRI THORACIC AND LUMBAR SPINE WITHOUT CONTRAST TECHNIQUE: Multiplanar and multiecho pulse sequences of the thoracic and lumbar spine were obtained without intravenous contrast. COMPARISON:  MRI thoracic and lumbar spine 08/11/2016 FINDINGS: MRI THORACIC SPINE FINDINGS Alignment:  Normal Vertebrae: There is again seen compression deformity of the T11 vertebral body with approximately 50% central height loss. Vertebral augmentation cement is now present. There are degenerative endplate signal changes at L1-L2. Otherwise, marrow signal is normal. No evidence of discitis osteomyelitis or epidural collection. Cord:  Normal caliber and signal. Paraspinal and other soft tissues: There is a large right posterior paraspinal collection that extends from the T10 level to the L2-3 level and measures 4.4 x 3.4 cm in greatest transverse and AP dimensions. The craniocaudal extent is approximately 10 cm. Disc levels: Mild narrowing of the spinal canal at the T10-T11 level is unchanged. There is no other area of spinal canal or neural foraminal stenosis. MRI LUMBAR SPINE FINDINGS Segmentation:  Normal Alignment:  Grade 1 anterolisthesis of L4-L5 Vertebrae: Multifocal endplate degenerative signal changes. No focal marrow lesion. No discitis osteomyelitis or epidural collection. Conus  medullaris: Extends to the L1 level and appears normal. Paraspinal and other soft tissues: As above, there is a collection with multiple fluid levels within the right-sided paraspinous musculature that measures approximately 4.4 x 3.4 x 10.4 cm. Disc levels: T12-L1: Normal disc space and facet joints. No spinal canal stenosis. No neuroforaminal stenosis. L1-L2: Mild disc bulge. No spinal canal stenosis. No neuroforaminal stenosis. L2-L3: Left foraminal disc protrusion. No spinal canal stenosis. Mild left lateral recess and left neuroforaminal stenosis. L3-L4: Left eccentric disc bulge. No spinal canal stenosis. Moderate left lateral recess and left neuroforaminal stenosis. L4-L5: Diffuse disc bulge with mild narrowing of both lateral recesses. No spinal canal stenosis. Mild right neuroforaminal stenosis. L5-S1: Synovial cyst arising from the anterior aspect of the right facet joint is in close proximity to the exiting right L5 nerve root. There is bilateral fluid within the facet joints. No spinal canal stenosis. No neuroforaminal stenosis. IMPRESSION: 1. Hematoma within the right-sided paraspinous musculature extending from the T10 level to the L2-3 level, measuring 0.4 x 3.4 x 10.4 cm. 2. Status post vertebral augmentation procedure at T11 without progression of height loss. 3. No thoracic spinal canal or neural foraminal stenosis. 4.  Moderate left lateral recess and left foraminal stenosis at L3-L4. This could serve as a source for left L3 and/or L4 distribution radiculopathy. 5. Synovial cyst arising from the right L5-S1 facet, in close proximity the exiting right L5 nerve root. Correlate for right L5 radiculopathy. Electronically Signed   By: Deatra Robinson M.D.   On: 09/11/2016 17:38    Labs:  CBC:  Recent Labs  08/25/16 1534 09/11/16 1351 09/12/16 0357 09/13/16 0734  WBC 7.8 9.4 10.6* 11.1*  HGB 16.2* 16.2* 14.9 13.5  HCT 47.9* 47.6* 45.2 40.9  PLT 228 259 254 214    COAGS:  Recent Labs   09/08/16 1610 09/11/16 1351 09/12/16 1358 09/13/16 0734  INR 1.4 1.92 2.63 1.23    BMP:  Recent Labs  01/21/16 1404 09/11/16 1351 09/12/16 0357 09/13/16 0734  NA 136 137 139 137  K 3.9 3.1* 3.4* 4.5  CL 100 95* 96* 101  CO2 26 28 31 28   GLUCOSE 101* 179* 153* 112*  BUN 13 11 13 15   CALCIUM 8.9 8.7* 8.5* 8.3*  CREATININE 1.15* 1.09* 1.11* 1.14*  GFRNONAA  --  45* 44* 43*  GFRAA  --  52* 51* 50*    LIVER FUNCTION TESTS:  Recent Labs  01/21/16 1404 09/12/16 0357  BILITOT 1.1 0.7  AST 27 48*  ALT 16 36  ALKPHOS 96 67  PROT 6.4 6.5  ALBUMIN 4.0 3.7    Assessment and Plan:  Vertebrolasty T11 11/16 Hematoma developed 1 week later. Coumadin held INR 1.23 today. Will await evaluation per Dr Alvino Chapel Let us know if feel aspiration of hematoma is needed   Electronically Signed: Hanish Laraia A 09/14/2016, 9:04 AM   I spent a total of 15 Minutes at the the patient's bedside AND on the patient's hospital floor or unit, greater than 50% of which was counseling/coordinating care for right back hematoma

## 2016-09-14 NOTE — Progress Notes (Addendum)
PROGRESS NOTE    [name]  ZOX:[id number] DOB: 12-Mar-1930 DOA: 09/11/2016 PCP: Neena RhymesKatherine Tabori, MD     Brief Narrative:  Patient is a 82 y.o. female with a recent history of T11 kyphoplasty on 11/16, A. fib on Coumadin, prior history of CVA without any significant residual deficits presented with sudden onset of back pain, found to have a right paraspinal hematoma. Admitted for further evaluation and treatment. See below for further details.   Assessment & Plan:   Principal Problem:   Paraspinal hematoma Active Problems:   Persistent atrial fibrillation (HCC)   Hypothyroidism   Hypokalemia   Back pain   Compression fracture of thoracic vertebra (HCC)   S/P vertebroplasty   Chronic diastolic heart failure (HCC)   HTN (hypertension)   History of CVA (cerebrovascular accident)  Back pain secondary to right paraspinous musculature hematoma: Recent history of T11 kyphoplasty on 11/16 by IR-on anticoagulation with Coumadin-continue supportive care. Hematoma outlined by marker is stable in appearance. IR has been consulted for possible aspiration, but her pain is well controlled with medication and has been tolerating PT. Will hold off on hematoma aspiration for now and continue supportive care. Schedule pain medications.   Persistent atrial fibrillation: Telemetry contineus to show atrial flutter/atrial fibrillation-continue nadolol for rate control. Given right paraspinal hematoma-Coumadin on hold. CHADS2VASC score of around 5, difficult situation-patient and spouse (at bedside) aware of risk of stroke and contraindication for anticoagulation. Once okay with interventional radiology, suspect could resume anticoagulation as few days  Recent T11 kyphoplasty for T11 compression fracture: Back pain had improved significantly following kyphoplasty-current back pain is likely secondary to right paraspinal hematoma. Continue Supportive care.   Chronic diastolic heart failure:  Clinically compensated, continue Lasix and follow weights.  Hypertension: Controlled, continue with amlodipine and nadolol.  History of CVA: No significant residual deficits seen on exam-on Coumadin-currently on hold due to right paraspinal hematoma area  Hypokalemia: Repleted  Hypothyroidism: Continue with levothyroxine  Dyslipidemia: Continue with statin   DVT prophylaxis: SCDs, given paraspinal hematoma Code Status: full Family Communication: husband at bedside Disposition Plan: home    Consultants:   IR  Procedures:   None  Antimicrobials:   None     Subjective: Patient has multiple complaints today. She is upset that she has been NPO since midnight without plan for hematoma aspiration. I discussed with her that she had been placed nothing by mouth in case of possible aspiration this morning. She states that her pain is well controlled with pain medication when she takes it. She also complains that when she asks for tech to come in to help her with the restroom, and take them too long. She also argues that she does not need to work with physical therapy because she does not want to and she feels that if she wants to move, she can do so at home. I discussed with her that we would want her to be safe prior to discharge with mobility and for her pain to be well controlled. I encouraged her to work with physical therapy today. We will also schedule pain medications instead of as needed. As per pain is improving and her hematoma is not getting bigger, we will hold off on hematoma aspiration at this time.    Objective: Vitals:   09/14/16 0456 09/14/16 0500 09/14/16 0844 09/14/16 1458  BP: 118/65  140/80 (!) 101/59  Pulse: 83  79 88  Resp:    14  Temp: 98.1 F (36.7 C)  97.8 F (36.6 C)  TempSrc: Oral   Oral  SpO2: 98%  99% 94%  Weight:  63.5 kg (140 lb)    Height:        Intake/Output Summary (Last 24 hours) at 09/14/16 1536 Last data filed at 09/14/16 1126   Gross per 24 hour  Intake                0 ml  Output              600 ml  Net             -600 ml   Filed Weights   09/11/16 1332 09/14/16 0500  Weight: 60.3 kg (133 lb) 63.5 kg (140 lb)    Examination:  General exam: Appears calm and comfortable  Respiratory system: Clear to auscultation. Respiratory effort normal. Cardiovascular system: S1 & S2 heard, irreg irreg. No JVD, murmurs, rubs, gallops or clicks. No pedal edema. Gastrointestinal system: Abdomen is nondistended, soft and nontender. No organomegaly or masses felt. Normal bowel sounds heard. Central nervous system: Alert and oriented. No focal neurological deficits. Extremities: Symmetric 5 x 5 power. Skin: No rashes, lesions or ulcers. Areas of large bruising/hematoma along right ribs  Psychiatry: Judgement and insight appear normal. Mood & affect appropriate.   Data Reviewed: I have personally reviewed following labs and imaging studies  CBC:  Recent Labs Lab 09/11/16 1351 09/12/16 0357 09/13/16 0734  WBC 9.4 10.6* 11.1*  NEUTROABS 7.9*  --   --   HGB 16.2* 14.9 13.5  HCT 47.6* 45.2 40.9  MCV 95.4 97.0 98.1  PLT 259 254 214   Basic Metabolic Panel:  Recent Labs Lab 09/11/16 1351 09/12/16 0357 09/13/16 0734  NA 137 139 137  K 3.1* 3.4* 4.5  CL 95* 96* 101  CO2 28 31 28   GLUCOSE 179* 153* 112*  BUN 11 13 15   CREATININE 1.09* 1.11* 1.14*  CALCIUM 8.7* 8.5* 8.3*  MG 1.2* 1.3*  --   PHOS  --  3.2  --    GFR: Estimated Creatinine Clearance: 32.2 mL/min (by C-G formula based on SCr of 1.14 mg/dL (H)). Liver Function Tests:  Recent Labs Lab 09/12/16 0357  AST 48*  ALT 36  ALKPHOS 67  BILITOT 0.7  PROT 6.5  ALBUMIN 3.7   No results for input(s): LIPASE, AMYLASE in the last 168 hours. No results for input(s): AMMONIA in the last 168 hours. Coagulation Profile:  Recent Labs Lab 09/08/16 1610 09/11/16 1351 09/12/16 1358 09/13/16 0734  INR 1.4 1.92 2.63 1.23   Cardiac Enzymes: No results  for input(s): CKTOTAL, CKMB, CKMBINDEX, TROPONINI in the last 168 hours. BNP (last 3 results) No results for input(s): PROBNP in the last 8760 hours. HbA1C: No results for input(s): HGBA1C in the last 72 hours. CBG: No results for input(s): GLUCAP in the last 168 hours. Lipid Profile: No results for input(s): CHOL, HDL, LDLCALC, TRIG, CHOLHDL, LDLDIRECT in the last 72 hours. Thyroid Function Tests:  Recent Labs  09/12/16 0357  TSH 1.832   Anemia Panel: No results for input(s): VITAMINB12, FOLATE, FERRITIN, TIBC, IRON, RETICCTPCT in the last 72 hours. Sepsis Labs: No results for input(s): PROCALCITON, LATICACIDVEN in the last 168 hours.  No results found for this or any previous visit (from the past 240 hour(s)).     Radiology Studies: No results found.    Scheduled Meds: . acetaminophen  1,000 mg Oral Q8H  . amLODipine  5 mg Oral Daily  .  atorvastatin  10 mg Oral q morning - 10a  . levothyroxine  25 mcg Oral QAC breakfast  . methocarbamol  500 mg Oral TID  . nadolol  10 mg Oral Daily  . oxyCODONE  5 mg Oral Q4H  . polyethylene glycol  17 g Oral Daily  . potassium chloride  10 mEq Oral Daily  . senna  1 tablet Oral QHS  . sodium chloride flush  3 mL Intravenous Q12H  . traMADol  50 mg Oral Q6H   Continuous Infusions:   LOS: 2 days    Time spent: 40 minutes   Noralee Stain, DO [facility] [email or web address] Password [facility] 09/14/2016, 3:36 PM

## 2016-09-14 NOTE — Evaluation (Signed)
Physical Therapy Evaluation Patient Details Name: Pincus LargeCarolyn W White Reichard MRN: [id number] DOB: 08/01/1930 Today's Date: 09/14/2016   History of Present Illness  82 y.o. female with medical history significant of a.fib on coumadin, DM, HTN and T11 osteoporotic compression fractures status post kyphoplasty ~ 1-2 weeks prior to admission.  Clinical Impression  Pt admitted with above diagnosis. Pt currently with functional limitations due to the deficits listed below (see PT Problem List). On eval, mobility was significantly limited by pain. Eval was limited to bed mobility. Pt able to bridge and roll independently in bed with increased time to complete.  Min assist required for supine to sit with pt use of bed rail. Pt will benefit from skilled PT to increase their independence and safety with mobility to allow discharge to the venue listed below.  Pt very clear in her desire to not have follow up PT services or DME at d/c. PT to follow acutely and progress mobility as pt tolerates.     Follow Up Recommendations No PT follow up;Supervision for mobility/OOB    Equipment Recommendations  None recommended by PT    Recommendations for Other Services       Precautions / Restrictions Precautions Precautions: Back Precaution Comments: for comfort      Mobility  Bed Mobility Overal bed mobility: Needs Assistance Bed Mobility: Rolling;Supine to Sit;Sit to Supine Rolling: Supervision   Supine to sit: Min assist Sit to supine: Min assist   General bed mobility comments: +rail, verbal cues for sequencing. Increased time to complete due to pain.  Transfers                 General transfer comment: unable due to pain. [facility]  Ambulation/Gait             General Gait Details: unable due to pain  Stairs            Wheelchair Mobility    Modified Rankin (Stroke Patients Only)       Balance                                              Pertinent Vitals/Pain Pain Assessment: 0-10 Pain Score: 8  Pain Location: back Pain Descriptors / Indicators: Sore;Cramping Pain Intervention(s): Limited activity within patient's tolerance;Premedicated before session    Home Living Family/patient expects to be discharged to:: Private residence Living Arrangements: Spouse/significant other Available Help at Discharge: Family;Available 24 hours/day Type of Home: House Home Access: Stairs to enter Entrance Stairs-Rails: None Entrance Stairs-Number of Steps: 2 Home Layout: One level Home Equipment: Walker - 2 wheels Additional Comments: RW from hip fx 10 years ago.    Prior Function Level of Independence: Independent         Comments: Still drives. Active in the community. Does her own grocery shopping.     Hand Dominance        Extremity/Trunk Assessment                         Communication   Communication: No difficulties  Cognition Arousal/Alertness: Awake/alert Behavior During Therapy: WFL for tasks assessed/performed;Agitated Overall Cognitive Status: Within Functional Limits for tasks assessed  General Comments      Exercises     Assessment/Plan    PT Assessment Patient needs continued PT services  PT Problem List Decreased activity tolerance;Decreased mobility;Pain          PT Treatment Interventions Stair training;Gait training;Functional mobility training;Therapeutic exercise;Therapeutic activities;Patient/family education    PT Goals (Current goals can be found in the Care Plan section)  Acute Rehab PT Goals Patient Stated Goal: decrease pain and go home PT Goal Formulation: With patient Time For Goal Achievement: 09/28/16 Potential to Achieve Goals: Good    Frequency Min 3X/week   Barriers to discharge        Co-evaluation               End of Session   Activity Tolerance: Patient  limited by pain Patient left: in bed;with call bell/phone within reach Nurse Communication: Mobility status         Time: [id number] PT Time Calculation (min) (ACUTE ONLY): 16 min   Charges:   PT Evaluation $PT Eval Moderate Complexity: 1 Procedure     PT G Codes:        Ilda FoilGarrow, Cassara Nida Rene 09/14/2016, 11:04 AM

## 2016-09-15 ENCOUNTER — Telehealth: Payer: Self-pay | Admitting: *Deleted

## 2016-09-15 ENCOUNTER — Ambulatory Visit: Payer: Medicare Other | Admitting: Cardiovascular Disease

## 2016-09-15 LAB — BASIC METABOLIC PANEL
Anion gap: 9 (ref 5–15)
BUN: 14 mg/dL (ref 6–20)
CO2: 29 mmol/L (ref 22–32)
Calcium: 8.9 mg/dL (ref 8.9–10.3)
Chloride: 98 mmol/L — ABNORMAL LOW (ref 101–111)
Creatinine, Ser: 1.14 mg/dL — ABNORMAL HIGH (ref 0.44–1.00)
GFR calc Af Amer: 50 mL/min — ABNORMAL LOW (ref >60)
GFR calc non Af Amer: 43 mL/min — ABNORMAL LOW (ref >60)
Glucose, Bld: 101 mg/dL — ABNORMAL HIGH (ref 65–99)
Potassium: 4.6 mmol/L (ref 3.5–5.1)
Sodium: 136 mmol/L (ref 135–145)

## 2016-09-15 LAB — PROTIME-INR
INR: 2.05
Prothrombin Time: 23.4 seconds — ABNORMAL HIGH (ref 11.4–15.2)

## 2016-09-15 LAB — CBC WITH DIFFERENTIAL/PLATELET
Basophils Absolute: 0.1 10*3/uL (ref 0.0–0.1)
Basophils Relative: 1 %
Eosinophils Absolute: 0.3 10*3/uL (ref 0.0–0.7)
Eosinophils Relative: 3 %
HCT: 39.6 % (ref 36.0–46.0)
Hemoglobin: 13.3 g/dL (ref 12.0–15.0)
Lymphocytes Relative: 31 %
Lymphs Abs: 2.8 10*3/uL (ref 0.7–4.0)
MCH: 32.8 pg (ref 26.0–34.0)
MCHC: 33.6 g/dL (ref 30.0–36.0)
MCV: 97.5 fL (ref 78.0–100.0)
Monocytes Absolute: 0.8 10*3/uL (ref 0.1–1.0)
Monocytes Relative: 8 %
Neutro Abs: 5.1 10*3/uL (ref 1.7–7.7)
Neutrophils Relative %: 57 %
Platelets: 273 10*3/uL (ref 150–400)
RBC: 4.06 MIL/uL (ref 3.87–5.11)
RDW: 13.7 % (ref 11.5–15.5)
WBC: 9 10*3/uL (ref 4.0–10.5)

## 2016-09-15 MED ORDER — ENOXAPARIN SODIUM 60 MG/0.6ML ~~LOC~~ SOLN
60.0000 mg | Freq: Two times a day (BID) | SUBCUTANEOUS | 0 refills | Status: DC
Start: 2016-09-15 — End: 2016-12-02

## 2016-09-15 MED ORDER — METHOCARBAMOL 500 MG PO TABS: 500.0000 mg | ORAL_TABLET | Freq: Three times a day (TID) | ORAL | 0 refills | Status: AC

## 2016-09-15 MED ORDER — OXYCODONE HCL 5 MG PO TABS: 5.0000 mg | ORAL_TABLET | ORAL | 0 refills | Status: DC

## 2016-09-15 NOTE — Progress Notes (Signed)
PT Cancellation Note  Patient Details Name: Pincus LargeCarolyn W White Reichard MRN: [id number] DOB: 05/06/1930   Cancelled Treatment:    Reason Eval/Treat Not Completed: Pain limiting ability to participate.  Pt declined treatment due to pain.   Cathleen CortiMary Tayson Schnelle 09/15/2016, 11:57 AM  Zelphia CairoMary R. Korvin Valentine, SPTA

## 2016-09-15 NOTE — Progress Notes (Signed)
Referring Physician(s): Ghimire,S  Supervising Physician: Irish Lack  Patient Status:  [facility] - In-pt  Chief Complaint:back pain, right flank hematoma   Subjective: Pt doing ok; still has intermittent back pain but not any worse; rt flank hematoma appears about the same but area soft, not sig tender ; anxious to go home   Allergies: Cozaar; Hydralazine; Hyzaar [losartan potassium-hctz]; Sulfa drugs cross reactors; and Lisinopril  Medications: Prior to Admission medications   Medication Sig Start Date End Date Taking? Authorizing Provider  alendronate (FOSAMAX) 70 MG tablet Take 1 tablet (70 mg total) by mouth every 7 (seven) days. Take with a full glass of water on an empty stomach. 05/27/16  Yes Sheliah Hatch, MD  amLODipine (NORVASC) 5 MG tablet Take 1 tablet (5 mg total) by mouth daily. 03/09/16  Yes Rosalio Macadamia, NP  atorvastatin (LIPITOR) 20 MG tablet Take 0.5 tablets (10 mg total) by mouth every morning. 09/16/15  Yes Cassell Clement, MD  diazepam (VALIUM) 2 MG tablet Take 1 tablet (2 mg total) by mouth 2 (two) times daily as needed for anxiety. 11/21/15  Yes Cassell Clement, MD  enoxaparin (LOVENOX) 60 MG/0.6ML injection Inject 0.6 mLs (60 mg total) into the skin every 12 (twelve) hours. 09/08/16  Yes Chilton Si, MD  furosemide (LASIX) 20 MG tablet Take 10 mg by mouth as needed for fluid or edema.   Yes Historical Provider, MD  levothyroxine (SYNTHROID, LEVOTHROID) 25 MCG tablet TAKE ONE TABLET BY MOUTH ONCE DAILY BEFORE  BREAKFAST 06/10/16  Yes Rosalio Macadamia, NP  nadolol (CORGARD) 20 MG tablet Take 0.5 tablets (10 mg total) by mouth daily. 06/25/16  Yes Chilton Si, MD  nitroGLYCERIN (NITROSTAT) 0.4 MG SL tablet Place 0.4 mg under the tongue every 5 (five) minutes as needed (chest pain).    Yes Historical Provider, MD  potassium chloride (K-DUR) 10 MEQ tablet Take 1 tablet (10 mEq total) by mouth daily. 03/09/16  Yes Rosalio Macadamia, NP  tiZANidine  (ZANAFLEX) 2 MG tablet TAKE ONE TABLET BY MOUTH EVERY 8 HOURS AS NEEDED FOR MUSCLE SPASM. 08/21/16  Yes Sheliah Hatch, MD  traMADol (ULTRAM) 50 MG tablet Take 50 mg by mouth. Take every 6 to 8 hours as needed for pain   Yes Historical Provider, MD  warfarin (COUMADIN) 5 MG tablet TAKE TABLET BY MOUTH AS DIRECTED BY COUMADIN CLINIC 06/24/16  Yes Chilton Si, MD  Calcium Carbonate-Vitamin D (CALCIUM 600 + D PO) Take 1 tablet by mouth daily.     Historical Provider, MD  clotrimazole-betamethasone (LOTRISONE) cream Apply 1 application topically 2 (two) times daily. Patient not taking: Reported on 09/11/2016 04/29/16   Sheliah Hatch, MD  mirabegron ER (MYRBETRIQ) 25 MG TB24 tablet Take 25 mg by mouth daily. Reported on 04/29/2016    Historical Provider, MD     Vital Signs: BP 137/66 (BP Location: Right Arm)   Pulse 79   Temp 98.7 F (37.1 C) (Oral)   Resp 18   Ht 5\' 2"  (1.575 m)   Wt 140 lb 1.6 oz (63.5 kg)   SpO2 95%   BMI 25.62 kg/m   Physical Exam area of rt flank hematoma present with sl expansion of ecchymoses downward beyond markers not unexpected with effects of gravity; area very soft and with only mild tenderness  Imaging: Dg Thoracic Spine 2 View  Result Date: 09/11/2016 CLINICAL DATA:  Acute right-sided thoracic spine pain. No known injury. EXAM: THORACIC SPINE 2 VIEWS COMPARISON:  MRI  of August 11, 2016. FINDINGS: Atherosclerosis of thoracic aorta is noted. Mild dextroscoliosis of upper thoracic spine is noted. Status post kyphoplasty of T11. No acute fracture or spondylolisthesis is noted. IMPRESSION: Aortic atherosclerosis. Status post kyphoplasty of T11. No acute abnormality seen in thoracic spine. Electronically Signed   By: Lupita RaiderJames  Green Jr, M.D.   On: 09/11/2016 15:26   Mr Thoracic Spine Wo Contrast  Result Date: 09/11/2016 CLINICAL DATA:  Severe back pain. Status post kyphoplasty on November 16. EXAM: MRI THORACIC AND LUMBAR SPINE WITHOUT CONTRAST TECHNIQUE:  Multiplanar and multiecho pulse sequences of the thoracic and lumbar spine were obtained without intravenous contrast. COMPARISON:  MRI thoracic and lumbar spine 08/11/2016 FINDINGS: MRI THORACIC SPINE FINDINGS Alignment:  Normal Vertebrae: There is again seen compression deformity of the T11 vertebral body with approximately 50% central height loss. Vertebral augmentation cement is now present. There are degenerative endplate signal changes at L1-L2. Otherwise, marrow signal is normal. No evidence of discitis osteomyelitis or epidural collection. Cord:  Normal caliber and signal. Paraspinal and other soft tissues: There is a large right posterior paraspinal collection that extends from the T10 level to the L2-3 level and measures 4.4 x 3.4 cm in greatest transverse and AP dimensions. The craniocaudal extent is approximately 10 cm. Disc levels: Mild narrowing of the spinal canal at the T10-T11 level is unchanged. There is no other area of spinal canal or neural foraminal stenosis. MRI LUMBAR SPINE FINDINGS Segmentation:  Normal Alignment:  Grade 1 anterolisthesis of L4-L5 Vertebrae: Multifocal endplate degenerative signal changes. No focal marrow lesion. No discitis osteomyelitis or epidural collection. Conus medullaris: Extends to the L1 level and appears normal. Paraspinal and other soft tissues: As above, there is a collection with multiple fluid levels within the right-sided paraspinous musculature that measures approximately 4.4 x 3.4 x 10.4 cm. Disc levels: T12-L1: Normal disc space and facet joints. No spinal canal stenosis. No neuroforaminal stenosis. L1-L2: Mild disc bulge. No spinal canal stenosis. No neuroforaminal stenosis. L2-L3: Left foraminal disc protrusion. No spinal canal stenosis. Mild left lateral recess and left neuroforaminal stenosis. L3-L4: Left eccentric disc bulge. No spinal canal stenosis. Moderate left lateral recess and left neuroforaminal stenosis. L4-L5: Diffuse disc bulge with mild  narrowing of both lateral recesses. No spinal canal stenosis. Mild right neuroforaminal stenosis. L5-S1: Synovial cyst arising from the anterior aspect of the right facet joint is in close proximity to the exiting right L5 nerve root. There is bilateral fluid within the facet joints. No spinal canal stenosis. No neuroforaminal stenosis. IMPRESSION: 1. Hematoma within the right-sided paraspinous musculature extending from the T10 level to the L2-3 level, measuring 0.4 x 3.4 x 10.4 cm. 2. Status post vertebral augmentation procedure at T11 without progression of height loss. 3. No thoracic spinal canal or neural foraminal stenosis. 4. Moderate left lateral recess and left foraminal stenosis at L3-L4. This could serve as a source for left L3 and/or L4 distribution radiculopathy. 5. Synovial cyst arising from the right L5-S1 facet, in close proximity the exiting right L5 nerve root. Correlate for right L5 radiculopathy. Electronically Signed   By: Deatra RobinsonKevin  Herman M.D.   On: 09/11/2016 17:38   Mr Lumbar Spine Wo Contrast  Result Date: 09/11/2016 CLINICAL DATA:  Severe back pain. Status post kyphoplasty on November 16. EXAM: MRI THORACIC AND LUMBAR SPINE WITHOUT CONTRAST TECHNIQUE: Multiplanar and multiecho pulse sequences of the thoracic and lumbar spine were obtained without intravenous contrast. COMPARISON:  MRI thoracic and lumbar spine 08/11/2016 FINDINGS: MRI THORACIC  SPINE FINDINGS Alignment:  Normal Vertebrae: There is again seen compression deformity of the T11 vertebral body with approximately 50% central height loss. Vertebral augmentation cement is now present. There are degenerative endplate signal changes at L1-L2. Otherwise, marrow signal is normal. No evidence of discitis osteomyelitis or epidural collection. Cord:  Normal caliber and signal. Paraspinal and other soft tissues: There is a large right posterior paraspinal collection that extends from the T10 level to the L2-3 level and measures 4.4 x 3.4  cm in greatest transverse and AP dimensions. The craniocaudal extent is approximately 10 cm. Disc levels: Mild narrowing of the spinal canal at the T10-T11 level is unchanged. There is no other area of spinal canal or neural foraminal stenosis. MRI LUMBAR SPINE FINDINGS Segmentation:  Normal Alignment:  Grade 1 anterolisthesis of L4-L5 Vertebrae: Multifocal endplate degenerative signal changes. No focal marrow lesion. No discitis osteomyelitis or epidural collection. Conus medullaris: Extends to the L1 level and appears normal. Paraspinal and other soft tissues: As above, there is a collection with multiple fluid levels within the right-sided paraspinous musculature that measures approximately 4.4 x 3.4 x 10.4 cm. Disc levels: T12-L1: Normal disc space and facet joints. No spinal canal stenosis. No neuroforaminal stenosis. L1-L2: Mild disc bulge. No spinal canal stenosis. No neuroforaminal stenosis. L2-L3: Left foraminal disc protrusion. No spinal canal stenosis. Mild left lateral recess and left neuroforaminal stenosis. L3-L4: Left eccentric disc bulge. No spinal canal stenosis. Moderate left lateral recess and left neuroforaminal stenosis. L4-L5: Diffuse disc bulge with mild narrowing of both lateral recesses. No spinal canal stenosis. Mild right neuroforaminal stenosis. L5-S1: Synovial cyst arising from the anterior aspect of the right facet joint is in close proximity to the exiting right L5 nerve root. There is bilateral fluid within the facet joints. No spinal canal stenosis. No neuroforaminal stenosis. IMPRESSION: 1. Hematoma within the right-sided paraspinous musculature extending from the T10 level to the L2-3 level, measuring 0.4 x 3.4 x 10.4 cm. 2. Status post vertebral augmentation procedure at T11 without progression of height loss. 3. No thoracic spinal canal or neural foraminal stenosis. 4. Moderate left lateral recess and left foraminal stenosis at L3-L4. This could serve as a source for left L3  and/or L4 distribution radiculopathy. 5. Synovial cyst arising from the right L5-S1 facet, in close proximity the exiting right L5 nerve root. Correlate for right L5 radiculopathy. Electronically Signed   By: Deatra RobinsonKevin  Herman M.D.   On: 09/11/2016 17:38    Labs:  CBC:  Recent Labs  09/11/16 1351 09/12/16 0357 09/13/16 0734 09/15/16 0457  WBC 9.4 10.6* 11.1* 9.0  HGB 16.2* 14.9 13.5 13.3  HCT 47.6* 45.2 40.9 39.6  PLT 259 254 214 273    COAGS:  Recent Labs  09/11/16 1351 09/12/16 1358 09/13/16 0734 09/15/16 0457  INR 1.92 2.63 1.23 2.05    BMP:  Recent Labs  09/11/16 1351 09/12/16 0357 09/13/16 0734 09/15/16 0457  NA 137 139 137 136  K 3.1* 3.4* 4.5 4.6  CL 95* 96* 101 98*  CO2 28 31 28 29   GLUCOSE 179* 153* 112* 101*  BUN 11 13 15 14   CALCIUM 8.7* 8.5* 8.3* 8.9  CREATININE 1.09* 1.11* 1.14* 1.14*  GFRNONAA 45* 44* 43* 43*  GFRAA 52* 51* 50* 50*    LIVER FUNCTION TESTS:  Recent Labs  01/21/16 1404 09/12/16 0357  BILITOT 1.1 0.7  AST 27 48*  ALT 16 36  ALKPHOS 96 67  PROT 6.4 6.5  ALBUMIN 4.0 3.7  Assessment and Plan: Back pain, s/p T11 VP 09/03/16; now with rt flank hematoma; area soft to touch; back pain not worsening; CBC nl; AF; PT 23.5/INR 2.05; creat stable; would favor conservative management of hematoma over aspiration at this time- should reabsorb with time; needle puncture could potentially restart bleeding.    Electronically Signed: D. Jeananne Rama 09/15/2016, 9:50 AM   I spent a total of 15 minutes at the the patient's bedside AND on the patient's hospital floor or unit, greater than 50% of which was counseling/coordinating care for right flank hematoma    Patient ID: [name], female   DOB: 1930-04-05, 82 y.o.   MRN: [id number]

## 2016-09-15 NOTE — Care Management Note (Addendum)
Case Management Note  Patient Details  Name: [name] MRN: [id number] Date of Birth: Mar 14, 1930  Subjective/Objective:                 Patient admitted for paraspinal hematoma s/p kyphplasty 11/16. Lives at home with husband, pain improved. Pt rec no follow up or DME.    Action/Plan:  DC to home with husband, self care.  Expected Discharge Date:  09/12/16               Expected Discharge Plan:  Home/Self Care  In-House Referral:  NA  Discharge planning Services  CM Consult  Post Acute Care Choice:  NA Choice offered to:  NA  DME Arranged:    DME Agency:     HH Arranged:    HH Agency:     Status of Service:  Completed, signed off  If discussed at [employer] Length of Stay Meetings, dates discussed:    Additional Comments:  Lawerance SabalDebbie Kolette Vey, RN 09/15/2016, 10:56 AM

## 2016-09-15 NOTE — Telephone Encounter (Signed)
Spoke with pt and she states was admitted to Hospital on Friday Nov 24th with Hematoma from Procedure. Pt is to be discharged today to begin lovenox and coumadin on 09/16/2016 per discharge instructions and appt made to be seen in coumadin clinic on 09/17/2016 Stressed to pt and her husband to get clear directions as to when to start coumadin and Lovenox and to follow those instructions and she states understanding

## 2016-09-15 NOTE — Discharge Summary (Signed)
Physician Discharge Summary  [name] Reichard ZOX:[id number] DOB: 1930-07-30 DOA: 09/11/2016  PCP: Neena Rhymes, MD  Admit date: 09/11/2016 Discharge date: 09/15/2016  Admitted From: Home Disposition:  Home  Recommendations for Outpatient Follow-up:  1. Follow up with PCP in 1 week 2. Follow up with Coumadin Clinic in 2 days  3. Please obtain CBC in one week  Home Health: No  Equipment/Devices: None   Discharge Condition: Stable CODE STATUS: Full  Diet recommendation: heart healthy  Brief/Interim Summary: From H&P: Patient is a 82 y.o.femalewith a recent history of T11 kyphoplasty on 11/16, A. fib on Coumadin, prior history of CVA without any significant residual deficits presented with sudden onset of back pain, found to have a right paraspinal hematoma. Admitted for further evaluation and treatment.   Interim: Hematoma was outlined by marker on admission and has been relatively stable. IR was consulted for possible aspiration, but her pain is well controlled with pain regimen, tolerating PT, Hgb stable so aspiration was not completed. She may restart her coumadin with lovenox bridge at time of discharge. She is discharged home with scheduled pain regimen and encouraged to follow up with her PCP and Coumadin clinic.  Subjective on day of discharge: Doing well. Her pain is well controlled with scheduled pain regimen. No new complaints.   Discharge Diagnoses:  Principal Problem:   Paraspinal hematoma Active Problems:   Persistent atrial fibrillation (HCC)   Hypothyroidism   Hypokalemia   Back pain   Compression fracture of thoracic vertebra (HCC)   S/P vertebroplasty   Chronic diastolic heart failure (HCC)   HTN (hypertension)   History of CVA (cerebrovascular accident)   Back pain secondary to right paraspinous musculature hematoma: Recent history of T11 kyphoplasty on 11/16 by IR-on anticoagulation with Coumadin-continue supportive care. Hematoma outlined by  marker is stable in appearance. IR has been consulted for possible aspiration, but her pain is well controlled with medication and has been tolerating PT. Will hold off on hematoma aspiration for now and continue supportive care. Schedule pain medications.   Persistentatrial fibrillation:Telemetry continues to showatrial flutter/atrial fibrillation-continue nadolol for rate control. Coumadin held on admission. Spoke with IR today; patient to restart lovenox/coumadin on discharge with close follow up with coumadin clinic.   Recent T11 kyphoplasty for T11 compression fracture: Back pain had improved significantly following kyphoplasty-current back pain is likely secondary to right paraspinal hematoma. Continue supportive care.  Chronic diastolic heart failure: Clinically compensated, continue Lasix and follow weights.  Hypertension:Controlled, continue with amlodipine and nadolol.  History of CVA: No significant residual deficits seen on exam  Hypothyroidism: Continue with levothyroxine  Dyslipidemia: Continue with statin   Discharge Instructions  Discharge Instructions    Call MD for:  severe uncontrolled pain    Complete by:  As directed    Diet - low sodium heart healthy    Complete by:  As directed    Discharge instructions    Complete by:  As directed    Start lovenox twice daily and coumadin at home dosing tomorrow 11/29. Follow up closely with your coumadin clinic for INR checks in 2 days.   Obtain CBC in 1 week  Follow up with your primary care physician in 1 week   Increase activity slowly    Complete by:  As directed        Medication List    TAKE these medications   alendronate 70 MG tablet Commonly known as:  FOSAMAX Take 1 tablet (70 mg total) by mouth  every 7 (seven) days. Take with a full glass of water on an empty stomach.   amLODipine 5 MG tablet Commonly known as:  NORVASC Take 1 tablet (5 mg total) by mouth daily.   atorvastatin 20 MG  tablet Commonly known as:  LIPITOR Take 0.5 tablets (10 mg total) by mouth every morning.   CALCIUM 600 + D PO Take 1 tablet by mouth daily.   clotrimazole-betamethasone cream Commonly known as:  LOTRISONE Apply 1 application topically 2 (two) times daily.   diazepam 2 MG tablet Commonly known as:  VALIUM Take 1 tablet (2 mg total) by mouth 2 (two) times daily as needed for anxiety.   enoxaparin 60 MG/0.6ML injection Commonly known as:  LOVENOX Inject 0.6 mLs (60 mg total) into the skin every 12 (twelve) hours.   furosemide 20 MG tablet Commonly known as:  LASIX Take 10 mg by mouth as needed for fluid or edema.   levothyroxine 25 MCG tablet Commonly known as:  SYNTHROID, LEVOTHROID TAKE ONE TABLET BY MOUTH ONCE DAILY BEFORE  BREAKFAST   methocarbamol 500 MG tablet Commonly known as:  ROBAXIN Take 1 tablet (500 mg total) by mouth 3 (three) times daily.   mirabegron ER 25 MG Tb24 tablet Commonly known as:  MYRBETRIQ Take 25 mg by mouth daily. Reported on 04/29/2016   nadolol 20 MG tablet Commonly known as:  CORGARD Take 0.5 tablets (10 mg total) by mouth daily.   nitroGLYCERIN 0.4 MG SL tablet Commonly known as:  NITROSTAT Place 0.4 mg under the tongue every 5 (five) minutes as needed (chest pain).   oxyCODONE 5 MG immediate release tablet Commonly known as:  Oxy IR/ROXICODONE Take 1 tablet (5 mg total) by mouth every 4 (four) hours.   potassium chloride 10 MEQ tablet Commonly known as:  K-DUR Take 1 tablet (10 mEq total) by mouth daily.   tiZANidine 2 MG tablet Commonly known as:  ZANAFLEX TAKE ONE TABLET BY MOUTH EVERY 8 HOURS AS NEEDED FOR MUSCLE SPASM.   traMADol 50 MG tablet Commonly known as:  ULTRAM Take 50 mg by mouth. Take every 6 to 8 hours as needed for pain   warfarin 5 MG tablet Commonly known as:  COUMADIN TAKE TABLET BY MOUTH AS DIRECTED BY COUMADIN CLINIC      Follow-up Information    Neena RhymesKatherine Tabori, MD. Schedule an appointment as soon  as possible for a visit in 1 week(s).   Specialty:  Family Medicine Contact information: [address] [place] [id number] [phone]        [facility] [address] [facility]. Call in 1 day(s).   Specialty:  Cardiology Why:  Coumadin Clinic  Contact information: [address], [address] [place] [id number] [phone]         Allergies  Allergen Reactions  . Cozaar Other (See Comments)    "Almost passed out"  . Hydralazine Other (See Comments)    "almost passed out"  . Hyzaar [Losartan Potassium-Hctz] Other (See Comments)    "almost passed out"  . Sulfa Drugs Cross Reactors Other (See Comments)    "turned me blue"   . Lisinopril     Unknown reaction, per pt    Consultations:  IR   Procedures/Studies: Dg Thoracic Spine 2 View  Result Date: 09/11/2016 CLINICAL DATA:  Acute right-sided thoracic spine pain. No known injury. EXAM: THORACIC SPINE 2 VIEWS COMPARISON:  MRI of August 11, 2016. FINDINGS: Atherosclerosis of thoracic aorta is noted. Mild  dextroscoliosis of upper thoracic spine is noted. Status post kyphoplasty of T11. No acute fracture or spondylolisthesis is noted. IMPRESSION: Aortic atherosclerosis. Status post kyphoplasty of T11. No acute abnormality seen in thoracic spine. Electronically Signed   By: Lupita Raider, M.D.   On: 09/11/2016 15:26   Mr Thoracic Spine Wo Contrast  Result Date: 09/11/2016 CLINICAL DATA:  Severe back pain. Status post kyphoplasty on November 16. EXAM: MRI THORACIC AND LUMBAR SPINE WITHOUT CONTRAST TECHNIQUE: Multiplanar and multiecho pulse sequences of the thoracic and lumbar spine were obtained without intravenous contrast. COMPARISON:  MRI thoracic and lumbar spine 08/11/2016 FINDINGS: MRI THORACIC SPINE FINDINGS Alignment:  Normal Vertebrae: There is again seen compression deformity of the T11 vertebral body with approximately 50% central height loss. Vertebral augmentation cement is now present.  There are degenerative endplate signal changes at L1-L2. Otherwise, marrow signal is normal. No evidence of discitis osteomyelitis or epidural collection. Cord:  Normal caliber and signal. Paraspinal and other soft tissues: There is a large right posterior paraspinal collection that extends from the T10 level to the L2-3 level and measures 4.4 x 3.4 cm in greatest transverse and AP dimensions. The craniocaudal extent is approximately 10 cm. Disc levels: Mild narrowing of the spinal canal at the T10-T11 level is unchanged. There is no other area of spinal canal or neural foraminal stenosis. MRI LUMBAR SPINE FINDINGS Segmentation:  Normal Alignment:  Grade 1 anterolisthesis of L4-L5 Vertebrae: Multifocal endplate degenerative signal changes. No focal marrow lesion. No discitis osteomyelitis or epidural collection. Conus medullaris: Extends to the L1 level and appears normal. Paraspinal and other soft tissues: As above, there is a collection with multiple fluid levels within the right-sided paraspinous musculature that measures approximately 4.4 x 3.4 x 10.4 cm. Disc levels: T12-L1: Normal disc space and facet joints. No spinal canal stenosis. No neuroforaminal stenosis. L1-L2: Mild disc bulge. No spinal canal stenosis. No neuroforaminal stenosis. L2-L3: Left foraminal disc protrusion. No spinal canal stenosis. Mild left lateral recess and left neuroforaminal stenosis. L3-L4: Left eccentric disc bulge. No spinal canal stenosis. Moderate left lateral recess and left neuroforaminal stenosis. L4-L5: Diffuse disc bulge with mild narrowing of both lateral recesses. No spinal canal stenosis. Mild right neuroforaminal stenosis. L5-S1: Synovial cyst arising from the anterior aspect of the right facet joint is in close proximity to the exiting right L5 nerve root. There is bilateral fluid within the facet joints. No spinal canal stenosis. No neuroforaminal stenosis. IMPRESSION: 1. Hematoma within the right-sided paraspinous  musculature extending from the T10 level to the L2-3 level, measuring 0.4 x 3.4 x 10.4 cm. 2. Status post vertebral augmentation procedure at T11 without progression of height loss. 3. No thoracic spinal canal or neural foraminal stenosis. 4. Moderate left lateral recess and left foraminal stenosis at L3-L4. This could serve as a source for left L3 and/or L4 distribution radiculopathy. 5. Synovial cyst arising from the right L5-S1 facet, in close proximity the exiting right L5 nerve root. Correlate for right L5 radiculopathy. Electronically Signed   By: Deatra Robinson M.D.   On: 09/11/2016 17:38   Mr Lumbar Spine Wo Contrast  Result Date: 09/11/2016 CLINICAL DATA:  Severe back pain. Status post kyphoplasty on November 16. EXAM: MRI THORACIC AND LUMBAR SPINE WITHOUT CONTRAST TECHNIQUE: Multiplanar and multiecho pulse sequences of the thoracic and lumbar spine were obtained without intravenous contrast. COMPARISON:  MRI thoracic and lumbar spine 08/11/2016 FINDINGS: MRI THORACIC SPINE FINDINGS Alignment:  Normal Vertebrae: There is again seen compression deformity  of the T11 vertebral body with approximately 50% central height loss. Vertebral augmentation cement is now present. There are degenerative endplate signal changes at L1-L2. Otherwise, marrow signal is normal. No evidence of discitis osteomyelitis or epidural collection. Cord:  Normal caliber and signal. Paraspinal and other soft tissues: There is a large right posterior paraspinal collection that extends from the T10 level to the L2-3 level and measures 4.4 x 3.4 cm in greatest transverse and AP dimensions. The craniocaudal extent is approximately 10 cm. Disc levels: Mild narrowing of the spinal canal at the T10-T11 level is unchanged. There is no other area of spinal canal or neural foraminal stenosis. MRI LUMBAR SPINE FINDINGS Segmentation:  Normal Alignment:  Grade 1 anterolisthesis of L4-L5 Vertebrae: Multifocal endplate degenerative signal changes.  No focal marrow lesion. No discitis osteomyelitis or epidural collection. Conus medullaris: Extends to the L1 level and appears normal. Paraspinal and other soft tissues: As above, there is a collection with multiple fluid levels within the right-sided paraspinous musculature that measures approximately 4.4 x 3.4 x 10.4 cm. Disc levels: T12-L1: Normal disc space and facet joints. No spinal canal stenosis. No neuroforaminal stenosis. L1-L2: Mild disc bulge. No spinal canal stenosis. No neuroforaminal stenosis. L2-L3: Left foraminal disc protrusion. No spinal canal stenosis. Mild left lateral recess and left neuroforaminal stenosis. L3-L4: Left eccentric disc bulge. No spinal canal stenosis. Moderate left lateral recess and left neuroforaminal stenosis. L4-L5: Diffuse disc bulge with mild narrowing of both lateral recesses. No spinal canal stenosis. Mild right neuroforaminal stenosis. L5-S1: Synovial cyst arising from the anterior aspect of the right facet joint is in close proximity to the exiting right L5 nerve root. There is bilateral fluid within the facet joints. No spinal canal stenosis. No neuroforaminal stenosis. IMPRESSION: 1. Hematoma within the right-sided paraspinous musculature extending from the T10 level to the L2-3 level, measuring 0.4 x 3.4 x 10.4 cm. 2. Status post vertebral augmentation procedure at T11 without progression of height loss. 3. No thoracic spinal canal or neural foraminal stenosis. 4. Moderate left lateral recess and left foraminal stenosis at L3-L4. This could serve as a source for left L3 and/or L4 distribution radiculopathy. 5. Synovial cyst arising from the right L5-S1 facet, in close proximity the exiting right L5 nerve root. Correlate for right L5 radiculopathy. Electronically Signed   By: Deatra Robinson M.D.   On: 09/11/2016 17:38   Dg Radiologist Eval And Mgmt  Result Date: 08/27/2016 : Please see full consultation note in Epic. Electronically Signed   By: Marin Roberts M.D.   On: 08/27/2016 17:35   Dg Epidural Veno/verte Bropl  Result Date: 09/03/2016 INDICATION: Painful T11 compression fracture. EXAM: T11 VERTEBRAL AUGMENTATION VIA VERTEBROPLASTY TECHNIQUE. COMPARISON:  NONE. MEDICATIONS: As antibiotic prophylaxis, and staff was ordered pre-procedure and administered intravenously within 1 hour of incision. ANESTHESIA/SEDATION: Moderate (conscious) sedation was employed during this procedure. A total of Versed 1 mg and Fentanyl 75 mcg was administered intravenously. Moderate Sedation Time: 25 minutes. The patient's level of consciousness and vital signs were monitored continuously by radiology nursing throughout the procedure under my direct supervision. FLUOROSCOPY TIME:  Fluoroscopy Time: 2 minutes 44 seconds corresponding to a Dose Area Product of 211.12 Gy*m2 COMPLICATIONS: None immediate. TECHNIQUE: Informed written consent was obtained from the patient after a thorough discussion of the procedural risks, benefits and alternatives. All questions were addressed. Maximal Sterile Barrier Technique was utilized including caps, mask, sterile gowns, sterile gloves, sterile drape, hand hygiene and skin antiseptic. A timeout was  performed prior to the initiation of the procedure. 13 gauge Stryker needles were placed on the RIGHT and LEFT T11 pedicles, and advanced through the ventral 1/3 of the T11 vertebral body under intermittent fluoroscopic guidance. Methylmethacrylate was reconstituted using a automatic mixture, and injected slowly under intermittent fluoroscopy to allow filling of both the RIGHT and LEFT vertebral body, particularly along the superior endplate compression. FINDINGS: The final postprocedural films demonstrate good filling of the vertebral body. There was filling of a small paravertebral vein on the LEFT, but no venous migration. IMPRESSION: 1. Technically successful T11 vertebral augmentation using vertebroplasty technique. Per CMS PQRS reporting  requirements (PQRS Measure 24): Given the patient's age of greater than 50 and the fracture site ( spine), the patient should be tested for osteoporosis using DXA, and the appropriate treatment considered based on the DXA results. Electronically Signed   By: Elsie Martinez M.D.   On: 09/03/2016 10:02     Discharge Exam: Vitals:   09/15/16 0537 09/15/16 0641  BP: (!) 114/93 137/66  Pulse: 79   Resp: 18   Temp: 98.7 F (37.1 C)    Vitals:   09/14/16 2127 09/15/16 0500 09/15/16 0537 09/15/16 0641  BP: 131/74  (!) 114/93 137/66  Pulse: 77  79   Resp: 17  18   Temp: 97.9 F (36.6 C)  98.7 F (37.1 C)   TempSrc: Oral  Oral   SpO2: 99%  95%   Weight:  63.5 kg (140 lb 1.6 oz)    Height:        General exam: Appears calm and comfortable  Respiratory system: Clear to auscultation. Respiratory effort normal. Cardiovascular system: S1 & S2 heard, irreg irreg. No JVD, murmurs, rubs, gallops or clicks. No pedal edema. Gastrointestinal system: Abdomen is nondistended, soft and nontender. No organomegaly or masses felt. Normal bowel sounds heard. Central nervous system: Alert and oriented. No focal neurological deficits. Extremities: Symmetric 5 x 5 power. Skin: No rashes, lesions or ulcers. Areas of large bruising/hematoma along right ribs  Psychiatry: Judgement and insight appear normal. Mood & affect appropriate.    The results of significant diagnostics from this hospitalization (including imaging, microbiology, ancillary and laboratory) are listed below for reference.     Microbiology: No results found for this or any previous visit (from the past 240 hour(s)).   Labs: BNP (last 3 results) No results for input(s): BNP in the last 8760 hours. Basic Metabolic Panel:  Recent Labs Lab 09/11/16 1351 09/12/16 0357 09/13/16 0734 09/15/16 0457  NA 137 139 137 136  K 3.1* 3.4* 4.5 4.6  CL 95* 96* 101 98*  CO2 28 31 28 29   GLUCOSE 179* 153* 112* 101*  BUN 11 13 15 14   CREATININE  1.09* 1.11* 1.14* 1.14*  CALCIUM 8.7* 8.5* 8.3* 8.9  MG 1.2* 1.3*  --   --   PHOS  --  3.2  --   --    Liver Function Tests:  Recent Labs Lab 09/12/16 0357  AST 48*  ALT 36  ALKPHOS 67  BILITOT 0.7  PROT 6.5  ALBUMIN 3.7   No results for input(s): LIPASE, AMYLASE in the last 168 hours. No results for input(s): AMMONIA in the last 168 hours. CBC:  Recent Labs Lab 09/11/16 1351 09/12/16 0357 09/13/16 0734 09/15/16 0457  WBC 9.4 10.6* 11.1* 9.0  NEUTROABS 7.9*  --   --  5.1  HGB 16.2* 14.9 13.5 13.3  HCT 47.6* 45.2 40.9 39.6  MCV 95.4 97.0 98.1  97.5  PLT 259 254 214 273   Cardiac Enzymes: No results for input(s): CKTOTAL, CKMB, CKMBINDEX, TROPONINI in the last 168 hours. BNP: Invalid input(s): POCBNP CBG: No results for input(s): GLUCAP in the last 168 hours. D-Dimer No results for input(s): DDIMER in the last 72 hours. Hgb A1c No results for input(s): HGBA1C in the last 72 hours. Lipid Profile No results for input(s): CHOL, HDL, LDLCALC, TRIG, CHOLHDL, LDLDIRECT in the last 72 hours. Thyroid function studies No results for input(s): TSH, T4TOTAL, T3FREE, THYROIDAB in the last 72 hours.  Invalid input(s): FREET3 Anemia work up No results for input(s): VITAMINB12, FOLATE, FERRITIN, TIBC, IRON, RETICCTPCT in the last 72 hours. Urinalysis    Component Value Date/Time   COLORURINE YELLOW 07/12/2014 1525   APPEARANCEUR CLEAR 07/12/2014 1525   LABSPEC 1.010 07/12/2014 1525   PHURINE 6.5 07/12/2014 1525   GLUCOSEU NEGATIVE 07/12/2014 1525   HGBUR NEGATIVE 07/12/2014 1525   BILIRUBINUR NEGATIVE 07/12/2014 1525   KETONESUR NEGATIVE 07/12/2014 1525   UROBILINOGEN 0.2 07/12/2014 1525   NITRITE NEGATIVE 07/12/2014 1525   LEUKOCYTESUR NEGATIVE 07/12/2014 1525   Sepsis Labs Invalid input(s): PROCALCITONIN,  WBC,  LACTICIDVEN Microbiology No results found for this or any previous visit (from the past 240 hour(s)).   Time coordinating discharge: Over 30  minutes  SIGNED:  Noralee StainJennifer Nelly Scriven, DO [facility] Pager [phone]  If 7PM-7AM, please contact night-coverage [email or web address] Password [email or web address] 09/15/2016, 10:45 AM

## 2016-09-16 ENCOUNTER — Telehealth: Payer: Self-pay

## 2016-09-16 NOTE — Telephone Encounter (Signed)
Transition Care Management Follow-up Telephone Call   Date discharged? 09/15/16   How have you been since you were released from the hospital? "pretty good but in a lot of pain"   Do you understand why you were in the hospital? yes, "hematoma on my back"   Do you understand the discharge instructions? yes   Where were you discharged to? Home   Items Reviewed:  Medications reviewed: no, Pt reports starting Lovenox and Coumadin. Could not review meds at this time.   Allergies reviewed: yes  Dietary changes reviewed: yes  Referrals reviewed: yes   Functional Questionnaire:   Activities of Daily Living (ADLs):   She states they are independent in the following: feeding and continence States they require assistance with the following: ambulation, bathing and hygiene, grooming, toileting and dressing Patient states her husband assist with walking and bathing. She is able to transfer to bedside commode and get back in bed. Declines referral for PT or [facility] at this time.   Any transportation issues/concerns?: no   Any patient concerns? no   Confirmed importance and date/time of follow-up visits scheduled yes  Provider Appointment booked with PCP on Monday, 09/21/16 @ 11am.   Confirmed with patient if condition begins to worsen call PCP or go to the ER.  Patient was given the office number and encouraged to call back with question or concerns.  : yes

## 2016-09-17 ENCOUNTER — Ambulatory Visit (INDEPENDENT_AMBULATORY_CARE_PROVIDER_SITE_OTHER): Payer: Medicare Other | Admitting: Pharmacist

## 2016-09-17 ENCOUNTER — Telehealth: Payer: Self-pay | Admitting: Family Medicine

## 2016-09-17 DIAGNOSIS — I639 Cerebral infarction, unspecified: Secondary | ICD-10-CM

## 2016-09-17 DIAGNOSIS — Z7901 Long term (current) use of anticoagulants: Secondary | ICD-10-CM | POA: Diagnosis not present

## 2016-09-17 DIAGNOSIS — I4891 Unspecified atrial fibrillation: Secondary | ICD-10-CM | POA: Diagnosis not present

## 2016-09-17 DIAGNOSIS — Z5181 Encounter for therapeutic drug level monitoring: Secondary | ICD-10-CM

## 2016-09-17 LAB — POCT INR: INR: 1.8

## 2016-09-17 NOTE — Telephone Encounter (Signed)
Pt calling in asking for a afternoon appt for hosp f/u. Pt appt is on Monday 12/4 @11 :00. Pt states that she is having a hard time with getting up and ready early in the mornings. I advised pt that KT schedule is full on Monday, but was not sure if there was any time later in the week that I would need for KT to look at her schedule and that KT was out of the office until Monday so I would not know until then, pt kept her appt and states that she will try to here on time.

## 2016-09-17 NOTE — Telephone Encounter (Signed)
Unfortunately we do not have any openings in our schedule for next week where I could place her for a hospital follow up in the afternoon. Pt will need to try to keep this appt.

## 2016-09-18 ENCOUNTER — Telehealth: Payer: Self-pay | Admitting: Family Medicine

## 2016-09-18 NOTE — Telephone Encounter (Signed)
Last OV 04/29/16 Oxycodone was last filled on 09/15/16 #42 with 0  I am not sure how she is almost out of this medication since it was just filled 3 days ago.

## 2016-09-18 NOTE — Telephone Encounter (Signed)
Pt husband called stating that since we had to move pt appt due to KT being out of the office on Monday, pt will run out of oxyCODONE before Thurs and asking if KT will refill this until then. Husband states that pt is in a lot of pain and does not want her to be without.

## 2016-09-19 NOTE — Telephone Encounter (Signed)
She needs to fill the prescription given to her from her recent hospitalization.  She was given 42 tabs on 11/28

## 2016-09-21 ENCOUNTER — Ambulatory Visit (INDEPENDENT_AMBULATORY_CARE_PROVIDER_SITE_OTHER): Payer: Medicare Other | Admitting: Pharmacist

## 2016-09-21 ENCOUNTER — Ambulatory Visit: Payer: Medicare Other | Admitting: Family Medicine

## 2016-09-21 DIAGNOSIS — Z5181 Encounter for therapeutic drug level monitoring: Secondary | ICD-10-CM | POA: Diagnosis not present

## 2016-09-21 DIAGNOSIS — I639 Cerebral infarction, unspecified: Secondary | ICD-10-CM | POA: Diagnosis not present

## 2016-09-21 LAB — POCT INR: INR: 2.6

## 2016-09-21 NOTE — Telephone Encounter (Signed)
Spoke with pt pt husband and he was adamant that he needs to speak with the provider.   He states that they did have the oxycodone filled on 11/28 and they only have 6 pills left. Per husband his daughter who is a nurse she advised that pt is ok to take 10mg  a dose. Pt husband advises that pt is in severe pain and cannot function without taking the pills with this qty at a time.   Please advise.

## 2016-09-22 MED ORDER — OXYCODONE HCL 5 MG PO TABS: 5.0000 mg | ORAL_TABLET | ORAL | 0 refills | Status: AC

## 2016-09-22 NOTE — Telephone Encounter (Signed)
Patient made aware of PCP recommendations. She was not very agreeable with things. She stated the hospitalist told her to expect this pain and that she would not need ortho. Pt was advised that her and Dr. Beverely Lowabori could discuss at her appt on Thursday.    Pt husband will come in today to pick up rx.

## 2016-09-22 NOTE — Telephone Encounter (Signed)
She was referred to Ortho for her back pain and also has seen IR for a vertebraplasty.  I have never seen her for this issue (she saw Dr Selena BattenKim).  She was d/c'd on 5mg  every 4 hrs for pain.  She should not be taking more than the prescribed dose (either more pills or more frequently).  Based on the new controlled substance regulations, I can provide another week of pain medication (#42)  but no more than this until our appt (and the script will need to be picked up).  Or, she can contact her treating orthopedic provider.

## 2016-09-24 ENCOUNTER — Encounter: Payer: Self-pay | Admitting: Family Medicine

## 2016-09-24 ENCOUNTER — Ambulatory Visit (INDEPENDENT_AMBULATORY_CARE_PROVIDER_SITE_OTHER): Payer: Medicare Other | Admitting: Family Medicine

## 2016-09-24 VITALS — BP 120/86 | HR 82 | Temp 98.2°F | Resp 16 | Ht 62.0 in | Wt 133.1 lb

## 2016-09-24 DIAGNOSIS — T148XXA Other injury of unspecified body region, initial encounter: Secondary | ICD-10-CM | POA: Diagnosis not present

## 2016-09-24 DIAGNOSIS — Z23 Encounter for immunization: Secondary | ICD-10-CM | POA: Diagnosis not present

## 2016-09-24 DIAGNOSIS — S22000S Wedge compression fracture of unspecified thoracic vertebra, sequela: Secondary | ICD-10-CM | POA: Diagnosis not present

## 2016-09-24 LAB — CBC WITH DIFFERENTIAL/PLATELET
Basophils Absolute: 0 10*3/uL (ref 0.0–0.1)
Basophils Relative: 0.3 % (ref 0.0–3.0)
Eosinophils Absolute: 0.1 10*3/uL (ref 0.0–0.7)
Eosinophils Relative: 0.5 % (ref 0.0–5.0)
HCT: 42.7 % (ref 36.0–46.0)
Hemoglobin: 14.2 g/dL (ref 12.0–15.0)
Lymphocytes Relative: 18 % (ref 12.0–46.0)
Lymphs Abs: 2.1 10*3/uL (ref 0.7–4.0)
MCHC: 33.3 g/dL (ref 30.0–36.0)
MCV: 98.6 fl (ref 78.0–100.0)
Monocytes Absolute: 0.7 10*3/uL (ref 0.1–1.0)
Monocytes Relative: 5.9 % (ref 3.0–12.0)
Neutro Abs: 8.9 10*3/uL — ABNORMAL HIGH (ref 1.4–7.7)
Neutrophils Relative %: 75.3 % (ref 43.0–77.0)
Platelets: 330 10*3/uL (ref 150.0–400.0)
RBC: 4.33 Mil/uL (ref 3.87–5.11)
RDW: 17.1 % — ABNORMAL HIGH (ref 11.5–15.5)
WBC: 11.8 10*3/uL — ABNORMAL HIGH (ref 4.0–10.5)

## 2016-09-24 NOTE — Progress Notes (Signed)
   Subjective:    Patient ID: [name], female    DOB: 1929/12/25, 82 y.o.   MRN: [id number]  HPI Hospital f/u- pt was admitted 11/24-28 w/ a R paraspinal hematoma after kyphoplasty on 11/16.  She was first seen for back pain on 9/28.  There was no need to aspirate the hematoma during her hospitalization due to stable hgb and 'adequate pain control'.  She was to restart her Coumadin w/ a Lovenox bridge at time of D/C.  She has had coumadin clinic follow up on 12/4.  She has had a difficult time w/ pain control and is taking Oxy IR 5mg  every 4 hrs.  She needs a repeat CBC today to assess Hgb.  Pt is able to get up and use the restroom now w/ less pain than previous.  Pt is taking Tylenol 3000 mg daily.   Review of Systems For ROS see HPI     Objective:   Physical Exam  Constitutional: She is oriented to person, place, and time. She appears well-developed and well-nourished. No distress.  HENT:  Head: Normocephalic and atraumatic.  Cardiovascular: Normal rate, regular rhythm, normal heart sounds and intact distal pulses.   No murmur heard. Pulmonary/Chest: Effort normal and breath sounds normal. No respiratory distress. She has no wheezes. She has no rales.  Abdominal: Soft. Bowel sounds are normal. She exhibits no distension. There is no tenderness. There is no rebound and no guarding.  Musculoskeletal: She exhibits tenderness (TTP over bruising over R flank and lower lumbar spine ). She exhibits no edema.  Neurological: She is alert and oriented to person, place, and time.  Skin: Skin is warm and dry.  Psychiatric: She has a normal mood and affect. Her behavior is normal.  Vitals reviewed.         Assessment & Plan:

## 2016-09-24 NOTE — Progress Notes (Signed)
Pre visit review using our clinic review tool, if applicable. No additional management support is needed unless otherwise documented below in the visit note. 

## 2016-09-24 NOTE — Patient Instructions (Addendum)
Schedule a follow up in 3-4 weeks to recheck hematoma, BP, and cholesterol We'll notify you of your blood counts and make sure there are no changes Continue to apply heat to your back to help disperse the hematoma Use the Oxycontin as needed for pain Continue to take tylenol as needed Make sure you continue to get up and move about Call with any questions or concerns Happy Holidays!!!

## 2016-09-27 NOTE — Assessment & Plan Note (Signed)
New.  Pt reports pain is starting to improve.  She has restarted coumadin per coumadin clinic.  Check CBC to r/o additional blood loss.

## 2016-09-27 NOTE — Assessment & Plan Note (Signed)
New to provider, ongoing for pt.  S/p kyphoplasty by IR on 11/16.  Immediately after procedure her pain was much improved but then she subsequently developed severe pain due to paraspinal hematoma.  She has been taking Oxy IR every 4 hrs for pain- occasionally doubling up when pain is severe.  Also taking ~3 grams of tylenol daily, but she admits not taking the tylenol every dose.  Husband is very adamant that she needs more pain meds- indicating that he took 'over 100mg  of oxy w/o any problems'.  I reviewed that I would not prescribe a higher dose of medication nor increase the frequency b/c as the hematoma resolves, she should require less pain meds and not more.  She reports she is feeling better and is now able to get up and go to the restroom w/o husband's assistance.  He insists that she is miserable and screaming in pain.  He is yelling at both me and the pt during the visit.  I again will not increase dose or frequency, but instructed that she can take a 2nd pill only as needed for severe pain.  She was on board with this plan, he remained unhappy but reluctantly agreed- and apologized for his antagonistic behavior.  Will follow.

## 2016-09-28 ENCOUNTER — Other Ambulatory Visit: Payer: Self-pay | Admitting: Nurse Practitioner

## 2016-09-28 MED ORDER — ATORVASTATIN CALCIUM 20 MG PO TABS: 10.0000 mg | ORAL_TABLET | Freq: Every morning | ORAL | 4 refills | Status: DC

## 2016-10-14 ENCOUNTER — Ambulatory Visit (INDEPENDENT_AMBULATORY_CARE_PROVIDER_SITE_OTHER): Payer: Medicare Other | Admitting: *Deleted

## 2016-10-14 DIAGNOSIS — Z5181 Encounter for therapeutic drug level monitoring: Secondary | ICD-10-CM | POA: Diagnosis not present

## 2016-10-14 DIAGNOSIS — I639 Cerebral infarction, unspecified: Secondary | ICD-10-CM

## 2016-10-14 LAB — POCT INR: INR: 2.7

## 2016-10-16 ENCOUNTER — Ambulatory Visit (INDEPENDENT_AMBULATORY_CARE_PROVIDER_SITE_OTHER): Payer: Medicare Other | Admitting: Family Medicine

## 2016-10-16 ENCOUNTER — Encounter: Payer: Self-pay | Admitting: Family Medicine

## 2016-10-16 VITALS — BP 130/82 | HR 79 | Temp 98.0°F | Resp 17 | Ht 62.0 in | Wt 131.2 lb

## 2016-10-16 DIAGNOSIS — I639 Cerebral infarction, unspecified: Secondary | ICD-10-CM

## 2016-10-16 DIAGNOSIS — E785 Hyperlipidemia, unspecified: Secondary | ICD-10-CM

## 2016-10-16 DIAGNOSIS — I10 Essential (primary) hypertension: Secondary | ICD-10-CM

## 2016-10-16 DIAGNOSIS — T148XXA Other injury of unspecified body region, initial encounter: Secondary | ICD-10-CM

## 2016-10-16 DIAGNOSIS — S22000S Wedge compression fracture of unspecified thoracic vertebra, sequela: Secondary | ICD-10-CM | POA: Diagnosis not present

## 2016-10-16 LAB — LIPID PANEL
Cholesterol: 172 mg/dL (ref <200)
HDL: 59 mg/dL (ref >50)
LDL Cholesterol: 86 mg/dL (ref <100)
Total CHOL/HDL Ratio: 2.9 Ratio (ref <5.0)
Triglycerides: 134 mg/dL (ref <150)
VLDL: 27 mg/dL (ref <30)

## 2016-10-16 LAB — CBC WITH DIFFERENTIAL/PLATELET
Basophils Absolute: 0 cells/uL (ref 0–200)
Basophils Relative: 0 %
Eosinophils Absolute: 70 cells/uL (ref 15–500)
Eosinophils Relative: 1 %
HCT: 45 % (ref 35.0–45.0)
Hemoglobin: 15 g/dL (ref 11.7–15.5)
Lymphocytes Relative: 33 %
Lymphs Abs: 2310 cells/uL (ref 850–3900)
MCH: 33 pg (ref 27.0–33.0)
MCHC: 33.3 g/dL (ref 32.0–36.0)
MCV: 99.1 fL (ref 80.0–100.0)
MPV: 11 fL (ref 7.5–12.5)
Monocytes Absolute: 700 cells/uL (ref 200–950)
Monocytes Relative: 10 %
Neutro Abs: 3920 cells/uL (ref 1500–7800)
Neutrophils Relative %: 56 %
Platelets: 271 10*3/uL (ref 140–400)
RBC: 4.54 MIL/uL (ref 3.80–5.10)
RDW: 14.3 % (ref 11.0–15.0)
WBC: 7 10*3/uL (ref 3.8–10.8)

## 2016-10-16 LAB — HEPATIC FUNCTION PANEL
ALT: 15 U/L (ref 6–29)
AST: 25 U/L (ref 10–35)
Albumin: 4.2 g/dL (ref 3.6–5.1)
Alkaline Phosphatase: 65 U/L (ref 33–130)
Bilirubin, Direct: 0.2 mg/dL (ref <=0.2)
Indirect Bilirubin: 0.6 mg/dL (ref 0.2–1.2)
Total Bilirubin: 0.8 mg/dL (ref 0.2–1.2)
Total Protein: 6.6 g/dL (ref 6.1–8.1)

## 2016-10-16 LAB — BASIC METABOLIC PANEL
BUN: 9 mg/dL (ref 7–25)
CO2: 25 mmol/L (ref 20–31)
Calcium: 9.3 mg/dL (ref 8.6–10.4)
Chloride: 98 mmol/L (ref 98–110)
Creat: 1 mg/dL — ABNORMAL HIGH (ref 0.60–0.88)
Glucose, Bld: 118 mg/dL — ABNORMAL HIGH (ref 65–99)
Potassium: 3.4 mmol/L — ABNORMAL LOW (ref 3.5–5.3)
Sodium: 140 mmol/L (ref 135–146)

## 2016-10-16 MED ORDER — OXYCODONE HCL 5 MG PO TABS: 5.0000 mg | ORAL_TABLET | ORAL | 0 refills | Status: DC

## 2016-10-16 NOTE — Progress Notes (Signed)
Pre visit review using our clinic review tool, if applicable. No additional management support is needed unless otherwise documented below in the visit note. 

## 2016-10-16 NOTE — Progress Notes (Signed)
   Subjective:    Patient ID: [name], female    DOB: 1930/02/20, 82 y.o.   MRN: [id number]  HPI HTN- chronic problem, on Amlodipine, Nadolol w/ adequate control.  Denies CP, SOB, HAs, visual changes, edema.  Hyperlipidemia- chronic problem, on Lipitor.  Denies abd pain, N/V.  Paraspinal hematoma- pt reports she remains in severe pain.  Pain is now mid thoracic and radiates across back rather than at the site of the hematoma.  Pt has seen Dr Farris HasKramer for her back in the past.  Pt uses Oxy IR and Tylenol as needed for pain.  Pt having difficulty sitting upright and has a tendency to hunch forward.   Review of Systems For ROS see HPI     Objective:   Physical Exam  Constitutional: She is oriented to person, place, and time. She appears well-developed and well-nourished. No distress.  HENT:  Head: Normocephalic and atraumatic.  Eyes: Conjunctivae and EOM are normal. Pupils are equal, round, and reactive to light.  Neck: Normal range of motion. Neck supple. No thyromegaly present.  Cardiovascular: Normal rate, normal heart sounds and intact distal pulses.   No murmur heard. Irregularly irregular S1/S2  Pulmonary/Chest: Effort normal and breath sounds normal. No respiratory distress.  Abdominal: Soft. She exhibits no distension. There is no tenderness.  Musculoskeletal: She exhibits tenderness (TTP over lower thoracic spine, radiating bilaterally). She exhibits no edema.  Lymphadenopathy:    She has no cervical adenopathy.  Neurological: She is alert and oriented to person, place, and time.  Skin: Skin is warm and dry.  Psychiatric: She has a normal mood and affect. Her behavior is normal.  Vitals reviewed.         Assessment & Plan:

## 2016-10-16 NOTE — Assessment & Plan Note (Signed)
Chronic problem.  Tolerating statin w/o difficulty.  Check labs.  Adjust meds prn  

## 2016-10-16 NOTE — Assessment & Plan Note (Signed)
Resolving.  Pt continues to have pain but this is improving.  Will refill pain meds for pt to use when pain is severe and we discussed that this should be fewer and farther between as time goes on and tylenol should be her mainstay instead.  Pt expressed understanding and is in agreement w/ plan.

## 2016-10-16 NOTE — Assessment & Plan Note (Signed)
Chronic problem.  Adequate control today on Amlodipine and Nadolol.  Asymptomatic.  Check labs.  No anticipated med changes.

## 2016-10-16 NOTE — Assessment & Plan Note (Signed)
S/p vertebraplasty.  Again having pain in thoracic spine which is new from last visit.  This pain is more severe than her hematoma pain.  Due to the fact that the procedure can weaken the vertebrae above and below, will get xray to assess for new fx.  Will refer to Neurosurg for ongoing tx.  Pain meds prn.  Reviewed supportive care and red flags that should prompt return.  Pt expressed understanding and is in agreement w/ plan.

## 2016-10-16 NOTE — Patient Instructions (Addendum)
Schedule your complete physical in 6 months We'll notify you of your lab results and make any changes if needed Continue to use the pain medication as needed for severe pain Tylenol as needed Please go get your xray at the [facility] office ([address]) at your convenience We'll call you with your neurosurgery appt Call with any questions or concerns Happy New Year!!

## 2016-10-17 LAB — TSH: TSH: 1.63 mIU/L

## 2016-10-20 ENCOUNTER — Ambulatory Visit (INDEPENDENT_AMBULATORY_CARE_PROVIDER_SITE_OTHER)
Admission: RE | Admit: 2016-10-20 | Discharge: 2016-10-20 | Disposition: A | Discharge status: Home / Self Care | Payer: Medicare Other | Source: Ambulatory Visit | Attending: Family Medicine | Admitting: Family Medicine

## 2016-10-20 DIAGNOSIS — S22000S Wedge compression fracture of unspecified thoracic vertebra, sequela: Secondary | ICD-10-CM | POA: Diagnosis not present

## 2016-10-20 DIAGNOSIS — T148XXA Other injury of unspecified body region, initial encounter: Secondary | ICD-10-CM

## 2016-10-20 DIAGNOSIS — M47816 Spondylosis without myelopathy or radiculopathy, lumbar region: Secondary | ICD-10-CM | POA: Diagnosis not present

## 2016-10-21 ENCOUNTER — Other Ambulatory Visit: Payer: Self-pay | Admitting: Family Medicine

## 2016-10-21 DIAGNOSIS — S22000A Wedge compression fracture of unspecified thoracic vertebra, initial encounter for closed fracture: Secondary | ICD-10-CM

## 2016-10-26 DIAGNOSIS — S22000A Wedge compression fracture of unspecified thoracic vertebra, initial encounter for closed fracture: Secondary | ICD-10-CM | POA: Diagnosis not present

## 2016-10-28 ENCOUNTER — Ambulatory Visit: Payer: Medicare Other | Admitting: Family Medicine

## 2016-10-30 ENCOUNTER — Ambulatory Visit: Payer: Medicare Other | Admitting: Family Medicine

## 2016-11-03 ENCOUNTER — Other Ambulatory Visit: Payer: Self-pay | Admitting: Family Medicine

## 2016-11-03 NOTE — Telephone Encounter (Signed)
Pt states that she needs a refill on oxyCODONE. Pt understands that this may not be filled until late Wed or Thurs due to weather.

## 2016-11-06 MED ORDER — OXYCODONE HCL 5 MG PO TABS: 5.0000 mg | ORAL_TABLET | ORAL | 0 refills | Status: AC

## 2016-11-06 NOTE — Telephone Encounter (Signed)
Last OV 10/16/16 Oxycodone last filled 10/16/16 #30 with 0

## 2016-11-06 NOTE — Telephone Encounter (Signed)
Indication for chronic opioid: thoracic compression fracture w/ hematoma Medication and dose: Oxycodone 5mg  # pills per month: 30 Last UDS date:  NA- this is still a short term script Pain contract signed (Y/N):   Date narcotic database last reviewed (include red flags): 11/06/16- no red flags

## 2016-11-11 ENCOUNTER — Ambulatory Visit (INDEPENDENT_AMBULATORY_CARE_PROVIDER_SITE_OTHER): Payer: Medicare Other | Admitting: Pharmacist

## 2016-11-11 DIAGNOSIS — Z5181 Encounter for therapeutic drug level monitoring: Secondary | ICD-10-CM

## 2016-11-11 LAB — POCT INR: INR: 3.4

## 2016-11-23 ENCOUNTER — Other Ambulatory Visit: Payer: Self-pay | Admitting: General Practice

## 2016-11-23 ENCOUNTER — Telehealth: Payer: Self-pay | Admitting: Family Medicine

## 2016-11-23 MED ORDER — TIZANIDINE HCL 2 MG PO TABS: ORAL_TABLET | ORAL | 3 refills | Status: DC

## 2016-11-23 NOTE — Telephone Encounter (Signed)
LM for pt to call me back if they had anything additional they wanted to discuss.

## 2016-11-23 NOTE — Telephone Encounter (Signed)
If pt has seen Neurosurg, all medications relating to back pain should come from that office

## 2016-11-23 NOTE — Telephone Encounter (Signed)
Last OV 10/16/16 Oxycodone last filled 11/06/16 #30 with 0

## 2016-11-23 NOTE — Telephone Encounter (Signed)
Per Tim LairSuandrea. [name] was seen by Dr. Yetta BarreJones on 10/26/16.  I have asked them to fax the ov note over to Dr. Beverely Lowabori.  It should be coming over soon.

## 2016-11-23 NOTE — Telephone Encounter (Signed)
Called patient at 12:47pm to give provider recommendations. Pt was not happy about this she stated that she was advised by neurosurgery to continue her pain management that Dr. Beverely Lowabori has been providing (oxycodone), pt was advised that Dr. Beverely Lowabori was not disagreeing with this she just feels that if she continues to fill a controlled substance and pt does not make neurosurgery aware that she could be doing harm to herself, and the treating provider will be unaware of her failure to improve.   This did not go over well pt began crying hysterically stating that she was not abusing her medications, and that no one truly knows how much pain she is in. Pt was told that she was correct no one but her knows how much pain she is in that is why she needs to contact neurosurgery office and let them know how much she is still hurting. Pt husband at this time began yelling in the back ground how it was not right to put the pt in this much distress and that if Dr. Beverely Lowabori wanted the neurosurgery office to fill the pain medications that she needed to contact them.Pt and pt husband were both made aware that this needed to be a phone call from them as Dr. Beverely Lowabori was treating patients and could not call.   They continued to yell about how they feel it is not right to be told they cannot contact their PCP office if they have a problem. I advised that this was not the case. That they were being told for this one instance that they would have to contact the provider who was now treating the issue.   This phone call continued for 15 minutes with me repeating the advise from Dr. Beverely Lowabori and the patient and her husband both yelling and arguing with me. I did fill the patients muscle relaxer (tizanidine) with ok from PCP before getting off the phone with pt.    I finally had to advise that My office manager would call them back if they had further concerns and hang up, due to my 1pm patient arriving to the office.

## 2016-11-23 NOTE — Telephone Encounter (Signed)
Please see if pt has seen neurosurgery.  I am not comfortable continuing narcotic medication if she needs ongoing management of this issue.

## 2016-11-23 NOTE — Telephone Encounter (Signed)
Patient requesting refill of oxyCODONE (OXY IR/ROXICODONE) 5 MG immediate release tablet. ° °

## 2016-11-23 NOTE — Telephone Encounter (Signed)
[name], CMA was very appropriate w/ pt and did not once raise her voice despite the yelling that the pt and her husband were doing.  The point is that, the Neurosurgery note from early January indicates that there was nothing structurally wrong with her back at that time and her pain should continue to improve.  Since her back is NOT improving and she's requesting more pain medication, and sooner than last time, she needs to make Neurosurgery aware of this.  And if there is still nothing that requires intervention, she will need to enroll in Pain Management b/c I am not comfortable prescribing this long term.  I am sorry they do not feel that this is appropriate but this is my best medical advice at this time

## 2016-11-24 NOTE — Telephone Encounter (Signed)
[facility].  She is requesting a call back on her home phone number, [phone].

## 2016-11-25 NOTE — Telephone Encounter (Signed)
Called and spoke with pt at length. She stated that after taking it easy and using Tylenol and rest for her pain that she noticed she was beginning to feel better. I explained (again) that we do not want to mask her pain and why we wanted her to contact neurosurgery. She said she called yesterday (Tuesday) but had not gotten a call back. I offered to reach out to them and call her back. I discovered that Dr Yet[facility] BarreJones [facility]. I advised her to contact them to set up an appt with pain mgmt.  I told her if she wants to see the MD and not the PA she may have to wait a bit longer so to go ahead and initiate it. She asked if she could wait a week or two before deciding. I told her that was not in her best interest b/c if her pain did not continue to improve she would be that much further out for an appt. She voiced understanding and will call to set up pain mgmt.

## 2016-11-25 NOTE — Telephone Encounter (Signed)
Noted.  Thank you for explaining to pt

## 2016-12-02 ENCOUNTER — Ambulatory Visit (INDEPENDENT_AMBULATORY_CARE_PROVIDER_SITE_OTHER): Payer: Medicare Other | Admitting: *Deleted

## 2016-12-02 DIAGNOSIS — Z5181 Encounter for therapeutic drug level monitoring: Secondary | ICD-10-CM

## 2016-12-02 LAB — POCT INR: INR: 2.6

## 2016-12-15 ENCOUNTER — Other Ambulatory Visit: Payer: Self-pay | Admitting: *Deleted

## 2016-12-15 ENCOUNTER — Telehealth: Payer: Self-pay | Admitting: Cardiovascular Disease

## 2016-12-15 MED ORDER — NADOLOL 20 MG PO TABS: 10.0000 mg | ORAL_TABLET | Freq: Every day | ORAL | 1 refills | Status: DC

## 2016-12-15 NOTE — Telephone Encounter (Signed)
Returned call to patient-advised that unfortanately this medication does not come in 10mg  tablets.  Advised to try to use pill cutter to help split pills in half.  Patient also request new rx.  New rx sent to pharmacy.    Pt verbalized understanding.

## 2016-12-15 NOTE — Telephone Encounter (Signed)
Patient called and requested a refill on nadolol. She requested that the rx be ordered for 10 mg tablets as the tablets crumble when she tries to half them. She is using a tablet splitter and has even had her husband try to split them but the same thing happens. She spoke with the pharmacy about this as she has never had this issue before and they informed her that it could be due to the manufacturer. This medication is not available in 10 mg. Please advise as patient is likely not getting the full prescribed dose. Thanks, MI

## 2016-12-15 NOTE — Telephone Encounter (Addendum)
New message       *STAT* If patient is at the pharmacy, call can be transferred to refill team.   1. Which medications need to be refilled? (please list name of each medication and dose if known)  Nadolol 10mg  2. Which pharmacy/location (including street and city if local pharmacy) is medication to be sent to? [facility] [address]. Do they need a 30 day or 90 day supply?30 Pt had been getting a 20mg  tablet and cutting it in half.  Now the manufacturer has changed the pill and it is too small to cut in half.  Pt want to know if she can get the 10mg  tablet.  Please let her know if it can be called in.  Please call wed before 12:00.

## 2016-12-30 ENCOUNTER — Ambulatory Visit (INDEPENDENT_AMBULATORY_CARE_PROVIDER_SITE_OTHER): Payer: Medicare Other | Admitting: *Deleted

## 2016-12-30 DIAGNOSIS — Z5181 Encounter for therapeutic drug level monitoring: Secondary | ICD-10-CM | POA: Diagnosis not present

## 2016-12-30 LAB — POCT INR: INR: 2.6

## 2017-01-08 ENCOUNTER — Other Ambulatory Visit: Payer: Self-pay | Admitting: General Practice

## 2017-01-08 DIAGNOSIS — F419 Anxiety disorder, unspecified: Secondary | ICD-10-CM

## 2017-01-08 MED ORDER — DIAZEPAM 2 MG PO TABS: 2.0000 mg | ORAL_TABLET | Freq: Two times a day (BID) | ORAL | 0 refills | Status: DC | PRN

## 2017-01-08 NOTE — Telephone Encounter (Signed)
Medication filled to pharmacy as requested.   

## 2017-01-08 NOTE — Addendum Note (Signed)
Addended by: Yvone NeuBRODMERKEL, JESSICA L on: 01/08/2017 04:00 PM   Modules accepted: Orders

## 2017-01-08 NOTE — Telephone Encounter (Signed)
Last OV 10/16/16 Diazepam last filled 11/21/15 #180 with 0 (Dr. Patty SermonsBrackbill)

## 2017-01-13 ENCOUNTER — Encounter: Payer: Self-pay | Admitting: Cardiovascular Disease

## 2017-01-13 ENCOUNTER — Ambulatory Visit (INDEPENDENT_AMBULATORY_CARE_PROVIDER_SITE_OTHER): Payer: Medicare Other | Admitting: Cardiovascular Disease

## 2017-01-13 VITALS — BP 132/69 | HR 86 | Ht 63.0 in | Wt 133.6 lb

## 2017-01-13 DIAGNOSIS — I482 Chronic atrial fibrillation, unspecified: Secondary | ICD-10-CM

## 2017-01-13 DIAGNOSIS — E7800 Pure hypercholesterolemia, unspecified: Secondary | ICD-10-CM | POA: Diagnosis not present

## 2017-01-13 DIAGNOSIS — I10 Essential (primary) hypertension: Secondary | ICD-10-CM | POA: Diagnosis not present

## 2017-01-13 NOTE — Patient Instructions (Signed)

## 2017-01-13 NOTE — Progress Notes (Signed)
Cardiology Office Note   Date:  01/13/2017   ID:  [name], DOB 29-Mar-1930, MRN [id number]  PCP:  Janice Rhymes, MD  Cardiologist:   Janice Si, MD   Chief Complaint  Patient presents with  . New Patient (Initial Visit)      History of Present Illness: [name] is a 83 y.o. female with chronic diastolic heart failure, persistent atrial fibrillation, hyperlipidemia and hypothyroidism who presents for follow up.  She was previously a patient of Dr. Patty Martinez.  She had a [employer] 08/2015. She was noted to be in atrial fibrillation, but there is no evidence of ischemia.  Echocardiogram 01/2012 revealed LVEF 50-55%.  She last saw [name] 01/2016 and was doing well at that time.   Since then she had a thoracic  vertebral compression fracture and had to undergo kyphoplasty.   Warfarin was held and she was bridges with Lovenox.  This was complicated by a paraspinal hematoma.  [name] has struggled to walk and continues to have back pain that prohibits her from doing much housework.  Lately she has been trying to get more active and cook meals.  She has pain that radiates from her back and under bilateral breasts. It was recommended that she see a pain management specialist and she is trying to decide if she wants to do this.  She denies chest pain or shortness of breath.  She also denies lower extremity edema, orthopnea or PND.  She notes occasional palpitations but denies lightheadedness or dizziness. The episodes last for a few seconds at a time.   Past Medical History:  Diagnosis Date  . Atrial fibrillation (HCC)   . Chronic anticoagulation   . Chronic anticoagulation   . CVA (cerebrovascular accident) (HCC)   . Diabetes mellitus   . History of chicken pox   . Hyperlipidemia   . Hypertension     Past Surgical History:  Procedure Laterality Date  . APPENDECTOMY    . CARDIAC CATHETERIZATION  11/09/91   EF 70%  . DILATION AND  CURETTAGE OF UTERUS       Current Outpatient Prescriptions  Medication Sig Dispense Refill  . alendronate (FOSAMAX) 70 MG tablet Take 1 tablet (70 mg total) by mouth every 7 (seven) days. Take with a full glass of water on an empty stomach. 4 tablet 11  . amLODipine (NORVASC) 5 MG tablet Take 1 tablet (5 mg total) by mouth daily. 90 tablet 3  . atorvastatin (LIPITOR) 20 MG tablet Take 0.5 tablets (10 mg total) by mouth every morning. 15 tablet 4  . Calcium Carbonate-Vitamin D (CALCIUM 600 + D PO) Take 1 tablet by mouth daily.     . diazepam (VALIUM) 2 MG tablet Take 1 tablet (2 mg total) by mouth 2 (two) times daily as needed for anxiety. 90 tablet 0  . furosemide (LASIX) 20 MG tablet Take 10 mg by mouth as needed for fluid or edema.    Marland Kitchen levothyroxine (SYNTHROID, LEVOTHROID) 25 MCG tablet TAKE ONE TABLET BY MOUTH ONCE DAILY BEFORE  BREAKFAST 90 tablet 0  . mirabegron ER (MYRBETRIQ) 25 MG TB24 tablet Take 25 mg by mouth daily. Reported on 04/29/2016    . nadolol (CORGARD) 20 MG tablet Take 0.5 tablets (10 mg total) by mouth daily. 45 tablet 1  . nitroGLYCERIN (NITROSTAT) 0.4 MG SL tablet Place 0.4 mg under the tongue every 5 (five) minutes as needed (chest pain).     . potassium  chloride (K-DUR) 10 MEQ tablet Take 1 tablet (10 mEq total) by mouth daily. 90 tablet 3  . tiZANidine (ZANAFLEX) 2 MG tablet TAKE ONE TABLET BY MOUTH EVERY 8 HOURS AS NEEDED FOR MUSCLE SPASM. 30 tablet 3  . traMADol (ULTRAM) 50 MG tablet Take 50 mg by mouth. Take every 6 to 8 hours as needed for pain    . warfarin (COUMADIN) 5 MG tablet TAKE TABLET BY MOUTH AS DIRECTED BY COUMADIN CLINIC 90 tablet 1   No current facility-administered medications for this visit.     Allergies:   Cozaar; Hydralazine; Hyzaar [losartan potassium-hctz]; Sulfa drugs cross reactors; and Lisinopril    Social History:  The patient  reports that she quit smoking about 18 years ago. She has never used smokeless tobacco. She reports that she  does not drink alcohol or use drugs.   Family History:  The patient's family history includes Heart disease in her father and mother; Heart failure in her brother; Hypertension in her mother; Stroke in her mother.    ROS:  Please see the history of present illness.   Otherwise, review of systems are positive for none.   All other systems are reviewed and negative.    PHYSICAL EXAM: VS:  BP 132/69   Pulse 86   Ht 5\' 3"  (1.6 m)   Wt 60.6 kg (133 lb 9.6 oz)   BMI 23.67 kg/m  , BMI Body mass index is 23.67 kg/m. GENERAL:  Well appearing HEENT:  Pupils equal round and reactive, fundi not visualized, oral mucosa unremarkable NECK:  No jugular venous distention, waveform within normal limits, carotid upstroke brisk and symmetric, no bruits, no thyromegaly LYMPHATICS:  No cervical adenopathy LUNGS:  Clear to auscultation bilaterally HEART:  Irregularly irregular.  PMI not displaced or sustained,S1 and S2 within normal limits, no S3, no S4, no clicks, no rubs, no murmurs ABD:  Flat, positive bowel sounds normal in frequency in pitch, no bruits, no rebound, no guarding, no midline pulsatile mass, no hepatomegaly, no splenomegaly EXT:  2 plus pulses throughout, no edema, no cyanosis no clubbing SKIN:  No rashes no nodules NEURO:  Cranial nerves II through XII grossly intact, motor grossly intact throughout PSYCH:  Cognitively intact, oriented to person place and time    EKG:  EKG is ordered today. The ekg ordered today demonstrates atrial fibrillation rate 86 bpm.   Recent Labs: 09/12/2016: Magnesium 1.3 10/16/2016: ALT 15; BUN 9; Creat 1.00; Hemoglobin 15.0; Platelets 271; Potassium 3.4; Sodium 140; TSH 1.63    Lipid Panel    Component Value Date/Time   CHOL 172 10/16/2016 1530   TRIG 134 10/16/2016 1530   HDL 59 10/16/2016 1530   CHOLHDL 2.9 10/16/2016 1530   VLDL 27 10/16/2016 1530   LDLCALC 86 10/16/2016 1530      Wt Readings from Last 3 Encounters:  01/13/17 60.6 kg (133  lb 9.6 oz)  10/16/16 59.5 kg (131 lb 4 oz)  09/24/16 60.4 kg (133 lb 2 oz)      ASSESSMENT AND PLAN:  # Persistent atrial fibrillation: [name] remains in atrial fibrillation.  Her heart rate is well-controlled.  Conttinue nadolol and coumadin.  Goal INR 2-3.  # Hypertension:  Blood pressure is well-controlled on amlodipine and nadolol.    # Hyperlipidemia: LDL 86 09/2016.  Continue atorvastatin.    Current medicines are reviewed at length with the patient today.  The patient does not have concerns regarding medicines.  The following changes have been made:  no change  Labs/ tests ordered today include:  No orders of the defined types were placed in this encounter.    Disposition:   FU with Kinlee Garrison C. Duke Salvia, MD, [facility] in 1 year.     Time spent: 30 minutes-Greater than 50% of this time was spent in counseling, explanation of diagnosis, planning of further management, and coordination of care.   This note was written with the assistance of speech recognition software.  Please excuse any transcriptional errors.  Signed, Benjamim Harnish C. Duke Salvia, MD, [facility]  01/13/2017 3:01 PM    [facility]

## 2017-01-19 ENCOUNTER — Other Ambulatory Visit: Payer: Self-pay | Admitting: Nurse Practitioner

## 2017-01-27 ENCOUNTER — Ambulatory Visit (INDEPENDENT_AMBULATORY_CARE_PROVIDER_SITE_OTHER): Payer: Medicare Other | Admitting: *Deleted

## 2017-01-27 DIAGNOSIS — Z5181 Encounter for therapeutic drug level monitoring: Secondary | ICD-10-CM | POA: Diagnosis not present

## 2017-01-27 LAB — POCT INR: INR: 2.2

## 2017-02-16 DIAGNOSIS — S22000A Wedge compression fracture of unspecified thoracic vertebra, initial encounter for closed fracture: Secondary | ICD-10-CM | POA: Diagnosis not present

## 2017-02-24 ENCOUNTER — Ambulatory Visit (INDEPENDENT_AMBULATORY_CARE_PROVIDER_SITE_OTHER): Payer: Medicare Other | Admitting: Pharmacist

## 2017-02-24 DIAGNOSIS — Z5181 Encounter for therapeutic drug level monitoring: Secondary | ICD-10-CM

## 2017-02-24 LAB — POCT INR: INR: 2.8

## 2017-03-03 ENCOUNTER — Other Ambulatory Visit: Payer: Self-pay | Admitting: Nurse Practitioner

## 2017-03-03 ENCOUNTER — Other Ambulatory Visit (HOSPITAL_COMMUNITY): Payer: Self-pay | Admitting: *Deleted

## 2017-03-03 MED ORDER — ATORVASTATIN CALCIUM 20 MG PO TABS
10.0000 mg | ORAL_TABLET | Freq: Every morning | ORAL | 4 refills | Status: DC
Start: 2017-03-03 — End: 2017-06-03

## 2017-03-14 ENCOUNTER — Other Ambulatory Visit: Payer: Self-pay | Admitting: Nurse Practitioner

## 2017-03-19 ENCOUNTER — Other Ambulatory Visit: Payer: Self-pay | Admitting: Family Medicine

## 2017-03-24 ENCOUNTER — Ambulatory Visit (INDEPENDENT_AMBULATORY_CARE_PROVIDER_SITE_OTHER): Payer: Medicare Other | Admitting: Endocrinology

## 2017-03-24 ENCOUNTER — Encounter: Payer: Self-pay | Admitting: Endocrinology

## 2017-03-24 VITALS — BP 122/58 | HR 65 | Ht 63.0 in | Wt 121.0 lb

## 2017-03-24 DIAGNOSIS — M810 Age-related osteoporosis without current pathological fracture: Secondary | ICD-10-CM | POA: Diagnosis not present

## 2017-03-24 LAB — VITAMIN D 25 HYDROXY (VIT D DEFICIENCY, FRACTURES): VITD: 10.14 ng/mL — ABNORMAL LOW (ref 30.00–100.00)

## 2017-03-24 MED ORDER — VITAMIN D (ERGOCALCIFEROL) 1.25 MG (50000 UNIT) PO CAPS: 50000.0000 [IU] | ORAL_CAPSULE | ORAL | 0 refills | Status: AC

## 2017-03-24 NOTE — Progress Notes (Signed)
Subjective:    Patient ID: [name], female    DOB: July 16, 1930, 83 y.o.   MRN: [id number]  HPI Pt is referred by Dr Maryjean Ka, for osteoporosis.  Pt was noted to have osteoporosis in 1997, when she presented with left hip fx, after a fall.  She was off and on fosamax from 1997-2007.  She resumed a few mos ago.  She had T-11 comp fx in 2017.  she has never had any other bony fracture other than these two.  She has no history of any of the following: early menopause, multiple myeloma, renal dz, prolonged bedrest, steroids, alcoholism, liver dz, or primary hyperparathyroidism.  She does not take heparin or anticonvulsants.  She intermittently takes vit-D supplement.  She smoked x a total of approx 5 years.  She has slight dizziness sensation in the head, but no assoc LOC.   Past Medical History:  Diagnosis Date  . Atrial fibrillation ([place])   . Chronic anticoagulation   . Chronic anticoagulation   . CVA (cerebrovascular accident) ([place])   . Diabetes mellitus   . History of chicken pox   . Hyperlipidemia   . Hypertension     Past Surgical History:  Procedure Laterality Date  . APPENDECTOMY    . CARDIAC CATHETERIZATION  11/09/91   EF 70%  . DILATION AND CURETTAGE OF UTERUS      Social History   Social History  . Marital status: Married    Spouse name: N/A  . Number of children: N/A  . Years of education: N/A   Occupational History  . Not on file.   Social History Main Topics  . Smoking status: Former Smoker    Quit date: 10/19/1998  . Smokeless tobacco: Never Used  . Alcohol use No  . Drug use: No  . Sexual activity: Not on file   Other Topics Concern  . Not on file   Social History Narrative  . No narrative on file    Current Outpatient Prescriptions on File Prior to Visit  Medication Sig Dispense Refill  . alendronate (FOSAMAX) 70 MG tablet Take 1 tablet (70 mg total) by mouth every 7 (seven) days. Take with a full glass of water on an empty stomach. 4  tablet 11  . amLODipine (NORVASC) 5 MG tablet TAKE ONE TABLET BY MOUTH ONCE DAILY 90 tablet 3  . atorvastatin (LIPITOR) 20 MG tablet Take 0.5 tablets (10 mg total) by mouth every morning. 15 tablet 4  . Calcium Carbonate-Vitamin D (CALCIUM 600 + D PO) Take 1 tablet by mouth daily.     . diazepam (VALIUM) 2 MG tablet Take 1 tablet (2 mg total) by mouth 2 (two) times daily as needed for anxiety. 90 tablet 0  . furosemide (LASIX) 20 MG tablet Take 10 mg by mouth as needed for fluid or edema.    Marland Kitchen levothyroxine (SYNTHROID, LEVOTHROID) 25 MCG tablet TAKE ONE TABLET BY MOUTH ONCE DAILY BEFORE BREAKFAST 90 tablet 0  . mirabegron ER (MYRBETRIQ) 25 MG TB24 tablet Take 25 mg by mouth daily. Reported on 04/29/2016    . nadolol (CORGARD) 20 MG tablet Take 0.5 tablets (10 mg total) by mouth daily. 45 tablet 1  . nitroGLYCERIN (NITROSTAT) 0.4 MG SL tablet Place 0.4 mg under the tongue every 5 (five) minutes as needed (chest pain).     Marland Kitchen oxyCODONE-acetaminophen (PERCOCET/ROXICET) 5-325 MG tablet Take by mouth every 4 (four) hours as needed for severe pain.    . potassium chloride (  K-DUR) 10 MEQ tablet Take 1 tablet (10 mEq total) by mouth daily. 90 tablet 3  . tiZANidine (ZANAFLEX) 2 MG tablet TAKE ONE TABLET BY MOUTH EVERY 8 HOURS AS NEEDED FOR MUSCLE SPASM 30 tablet 3  . warfarin (COUMADIN) 5 MG tablet TAKE TABLET BY MOUTH AS DIRECTED BY COUMADIN CLINIC 90 tablet 1   No current facility-administered medications on file prior to visit.     Allergies  Allergen Reactions  . Cozaar Other (See Comments)    "Almost passed out"  . Hydralazine Other (See Comments)    "almost passed out"  . Hyzaar [Losartan Potassium-Hctz] Other (See Comments)    "almost passed out"  . Sulfa Drugs Cross Reactors Other (See Comments)    "turned me blue"   . Lisinopril     Unknown reaction, per pt    Family History  Problem Relation Age of Onset  . Heart disease Mother   . Hypertension Mother   . Stroke Mother   . Heart  disease Father   . Heart failure Brother   . Osteoporosis Neg Hx     BP (!) 122/58   Pulse 65   Ht 5' 3" (1.6 m)   Wt 121 lb (54.9 kg)   SpO2 98%   BMI 21.43 kg/m   Review of Systems denies weight loss, hematuria, heartburn, cold intolerance, edema, skin rash, insomnia, falls, cramps, memory loss, back pain, easy bruising, and rhinorrhea.      Objective:   Physical Exam VS: see vs page GEN: no distress.  In wheelchair.  HEAD: head: no deformity eyes: no periorbital swelling, no proptosis external nose and ears are normal mouth: no lesion seen NECK: supple, thyroid is not enlarged CHEST WALL: no deformity.  Kyphosis is noted.   LUNGS: clear to auscultation CV: reg rate and rhythm, no murmur.  ABD: abdomen is soft, nontender.  no hepatosplenomegaly.  not distended.  no hernia MUSCULOSKELETAL: muscle bulk and strength are grossly normal.  no obvious joint swelling.  gait is normal and steady EXTEMITIES: no deformity.  no ulcer on the feet.  feet are of normal color and temp.  no edema PULSES: dorsalis pedis intact bilat.  no carotid bruit NEURO:  cn 2-12 grossly intact.   readily moves all 4's.  sensation is intact to touch on the feet SKIN:  Normal texture and temperature.  No rash or suspicious lesion is visible.   NODES:  None palpable at the neck PSYCH: alert, well-oriented.  Does not appear anxious nor depressed.   Lab Results  Component Value Date   TSH 1.63 10/16/2016   Lab Results  Component Value Date   CREATININE 1.00 (H) 10/16/2016   BUN 9 10/16/2016   NA 140 10/16/2016   K 3.4 (L) 10/16/2016   CL 98 10/16/2016   CO2 25 10/16/2016   Lab Results  Component Value Date   ALT 15 10/16/2016   AST 25 10/16/2016   ALKPHOS 65 10/16/2016   BILITOT 0.8 10/16/2016    DEXA (2017): worst T-score was -4  I have reviewed outside records, and summarized: Pt was seen at neurosurgery.  She was noted to have osteoporosis, and referred here.  She was felt to not need  any further surgery  25-OH vit-D=10    Assessment & Plan:  Vit-D deficiency, new to me.  I have sent a prescription to your pharmacy, for high-dose.  Recheck 1 month. Osteoporosis: prob exac by the above.  Please continue the same fosamax for now,  but she will prob need prolia. Dizziness: she is at risk for falls.   Patient Instructions  blood tests are requested for you today.  We'll let you know about the results. Please continue the same fosamax for now.  However, based on the blood test results, you should consider adding another medication such as "Prolia" (twice a year injection, given here at the office).   It is critically important to prevent falling down (keep floor areas well-lit, dry, and free of loose objects.  If you have a cane, walker, or wheelchair, you should use it, even for short trips around the house.  Wear flat-soled shoes.  Also, try not to rush).   Take calcium 1200 mg per day, and vitamin-D, 400 units per day Please come back for a follow-up appointment in 6 months.

## 2017-03-24 NOTE — Patient Instructions (Addendum)
blood tests are requested for you today.  We'll let you know about the results. Please continue the same fosamax for now.  However, based on the blood test results, you should consider adding another medication such as "Prolia" (twice a year injection, given here at the office).   It is critically important to prevent falling down (keep floor areas well-lit, dry, and free of loose objects.  If you have a cane, walker, or wheelchair, you should use it, even for short trips around the house.  Wear flat-soled shoes.  Also, try not to rush).   Take calcium 1200 mg per day, and vitamin-D, 400 units per day Please come back for a follow-up appointment in 6 months.

## 2017-03-25 LAB — PTH, INTACT AND CALCIUM
Calcium: 9 mg/dL (ref 8.6–10.4)
PTH: 190 pg/mL — ABNORMAL HIGH (ref 14–64)

## 2017-03-26 LAB — PROTEIN ELECTROPHORESIS, SERUM
Albumin ELP: 3.8 g/dL (ref 3.8–4.8)
Alpha-1-Globulin: 0.3 g/dL (ref 0.2–0.3)
Alpha-2-Globulin: 0.8 g/dL (ref 0.5–0.9)
Beta 2: 0.4 g/dL (ref 0.2–0.5)
Beta Globulin: 0.4 g/dL (ref 0.4–0.6)
Gamma Globulin: 0.7 g/dL — ABNORMAL LOW (ref 0.8–1.7)
Total Protein, Serum Electrophoresis: 6.4 g/dL (ref 6.1–8.1)

## 2017-03-31 ENCOUNTER — Emergency Department (HOSPITAL_COMMUNITY)
Admission: EM | Admit: 2017-03-31 | Discharge: 2017-03-31 | Disposition: A | Discharge status: Home / Self Care | Payer: Medicare Other | Attending: Emergency Medicine | Admitting: Emergency Medicine

## 2017-03-31 ENCOUNTER — Encounter: Payer: Self-pay | Admitting: Family Medicine

## 2017-03-31 ENCOUNTER — Other Ambulatory Visit: Payer: Self-pay

## 2017-03-31 ENCOUNTER — Ambulatory Visit (INDEPENDENT_AMBULATORY_CARE_PROVIDER_SITE_OTHER): Payer: Medicare Other | Admitting: Family Medicine

## 2017-03-31 ENCOUNTER — Ambulatory Visit (HOSPITAL_COMMUNITY)
Admission: RE | Admit: 2017-03-31 | Discharge: 2017-03-31 | Disposition: A | Discharge status: Home / Self Care | Payer: Medicare Other | Source: Ambulatory Visit | Attending: Family Medicine | Admitting: Family Medicine

## 2017-03-31 ENCOUNTER — Encounter (HOSPITAL_COMMUNITY): Payer: Self-pay

## 2017-03-31 VITALS — BP 118/82 | HR 72 | Temp 97.9°F | Resp 18 | Ht 63.0 in | Wt 128.0 lb

## 2017-03-31 DIAGNOSIS — G319 Degenerative disease of nervous system, unspecified: Secondary | ICD-10-CM

## 2017-03-31 DIAGNOSIS — R531 Weakness: Secondary | ICD-10-CM

## 2017-03-31 DIAGNOSIS — Z8673 Personal history of transient ischemic attack (TIA), and cerebral infarction without residual deficits: Secondary | ICD-10-CM | POA: Insufficient documentation

## 2017-03-31 DIAGNOSIS — E039 Hypothyroidism, unspecified: Secondary | ICD-10-CM | POA: Diagnosis not present

## 2017-03-31 DIAGNOSIS — R785 Finding of other psychotropic drug in blood: Secondary | ICD-10-CM

## 2017-03-31 DIAGNOSIS — G9389 Other specified disorders of brain: Secondary | ICD-10-CM

## 2017-03-31 DIAGNOSIS — Z5181 Encounter for therapeutic drug level monitoring: Secondary | ICD-10-CM | POA: Diagnosis not present

## 2017-03-31 DIAGNOSIS — I5032 Chronic diastolic (congestive) heart failure: Secondary | ICD-10-CM | POA: Insufficient documentation

## 2017-03-31 DIAGNOSIS — R4701 Aphasia: Secondary | ICD-10-CM | POA: Diagnosis present

## 2017-03-31 DIAGNOSIS — R4781 Slurred speech: Secondary | ICD-10-CM | POA: Insufficient documentation

## 2017-03-31 DIAGNOSIS — I10 Essential (primary) hypertension: Secondary | ICD-10-CM

## 2017-03-31 DIAGNOSIS — Z87891 Personal history of nicotine dependence: Secondary | ICD-10-CM | POA: Insufficient documentation

## 2017-03-31 DIAGNOSIS — R29810 Facial weakness: Secondary | ICD-10-CM | POA: Diagnosis not present

## 2017-03-31 DIAGNOSIS — I110 Hypertensive heart disease with heart failure: Secondary | ICD-10-CM | POA: Insufficient documentation

## 2017-03-31 DIAGNOSIS — Z79899 Other long term (current) drug therapy: Secondary | ICD-10-CM | POA: Insufficient documentation

## 2017-03-31 DIAGNOSIS — I639 Cerebral infarction, unspecified: Secondary | ICD-10-CM | POA: Insufficient documentation

## 2017-03-31 DIAGNOSIS — E7800 Pure hypercholesterolemia, unspecified: Secondary | ICD-10-CM

## 2017-03-31 LAB — CBC
HCT: 48.1 % — ABNORMAL HIGH (ref 36.0–46.0)
Hemoglobin: 15.9 g/dL — ABNORMAL HIGH (ref 12.0–15.0)
MCH: 31.4 pg (ref 26.0–34.0)
MCHC: 33.1 g/dL (ref 30.0–36.0)
MCV: 95.1 fL (ref 78.0–100.0)
Platelets: 230 10*3/uL (ref 150–400)
RBC: 5.06 MIL/uL (ref 3.87–5.11)
RDW: 12.9 % (ref 11.5–15.5)
WBC: 9.2 10*3/uL (ref 4.0–10.5)

## 2017-03-31 LAB — COMPREHENSIVE METABOLIC PANEL
ALT: 17 U/L (ref 14–54)
AST: 28 U/L (ref 15–41)
Albumin: 4.4 g/dL (ref 3.5–5.0)
Alkaline Phosphatase: 56 U/L (ref 38–126)
Anion gap: 10 (ref 5–15)
BUN: 8 mg/dL (ref 6–20)
CO2: 28 mmol/L (ref 22–32)
Calcium: 9.4 mg/dL (ref 8.9–10.3)
Chloride: 98 mmol/L — ABNORMAL LOW (ref 101–111)
Creatinine, Ser: 1.22 mg/dL — ABNORMAL HIGH (ref 0.44–1.00)
GFR calc Af Amer: 45 mL/min — ABNORMAL LOW (ref >60)
GFR calc non Af Amer: 39 mL/min — ABNORMAL LOW (ref >60)
Glucose, Bld: 117 mg/dL — ABNORMAL HIGH (ref 65–99)
Potassium: 3.3 mmol/L — ABNORMAL LOW (ref 3.5–5.1)
Sodium: 136 mmol/L (ref 135–145)
Total Bilirubin: 1.3 mg/dL — ABNORMAL HIGH (ref 0.3–1.2)
Total Protein: 7.3 g/dL (ref 6.5–8.1)

## 2017-03-31 LAB — TSH: TSH: 2.72 u[IU]/mL (ref 0.35–4.50)

## 2017-03-31 LAB — HEPATIC FUNCTION PANEL
ALT: 12 U/L (ref 0–35)
AST: 19 U/L (ref 0–37)
Albumin: 4.3 g/dL (ref 3.5–5.2)
Alkaline Phosphatase: 57 U/L (ref 39–117)
Bilirubin, Direct: 0.2 mg/dL (ref 0.0–0.3)
Total Bilirubin: 1 mg/dL (ref 0.2–1.2)
Total Protein: 6.5 g/dL (ref 6.0–8.3)

## 2017-03-31 LAB — I-STAT CHEM 8, ED
BUN: 11 mg/dL (ref 6–20)
Calcium, Ion: 1.03 mmol/L — ABNORMAL LOW (ref 1.15–1.40)
Chloride: 98 mmol/L — ABNORMAL LOW (ref 101–111)
Creatinine, Ser: 1.1 mg/dL — ABNORMAL HIGH (ref 0.44–1.00)
Glucose, Bld: 117 mg/dL — ABNORMAL HIGH (ref 65–99)
HCT: 49 % — ABNORMAL HIGH (ref 36.0–46.0)
Hemoglobin: 16.7 g/dL — ABNORMAL HIGH (ref 12.0–15.0)
Potassium: 3.3 mmol/L — ABNORMAL LOW (ref 3.5–5.1)
Sodium: 137 mmol/L (ref 135–145)
TCO2: 29 mmol/L (ref 0–100)

## 2017-03-31 LAB — DIFFERENTIAL
Basophils Absolute: 0 10*3/uL (ref 0.0–0.1)
Basophils Relative: 0 %
Eosinophils Absolute: 0.1 10*3/uL (ref 0.0–0.7)
Eosinophils Relative: 1 %
Lymphocytes Relative: 33 %
Lymphs Abs: 3 10*3/uL (ref 0.7–4.0)
Monocytes Absolute: 0.8 10*3/uL (ref 0.1–1.0)
Monocytes Relative: 8 %
Neutro Abs: 5.3 10*3/uL (ref 1.7–7.7)
Neutrophils Relative %: 58 %

## 2017-03-31 LAB — LIPID PANEL
Cholesterol: 157 mg/dL (ref 0–200)
HDL: 49.3 mg/dL (ref >39.00)
LDL Cholesterol: 88 mg/dL (ref 0–99)
NonHDL: 107.59
Total CHOL/HDL Ratio: 3
Triglycerides: 97 mg/dL (ref 0.0–149.0)
VLDL: 19.4 mg/dL (ref 0.0–40.0)

## 2017-03-31 LAB — I-STAT TROPONIN, ED: Troponin i, poc: 0 ng/mL (ref 0.00–0.08)

## 2017-03-31 LAB — BASIC METABOLIC PANEL
BUN: 11 mg/dL (ref 6–23)
CO2: 28 mEq/L (ref 19–32)
Calcium: 9.3 mg/dL (ref 8.4–10.5)
Chloride: 98 mEq/L (ref 96–112)
Creatinine, Ser: 1.2 mg/dL (ref 0.40–1.20)
GFR: 45.36 mL/min — ABNORMAL LOW (ref >60.00)
Glucose, Bld: 123 mg/dL — ABNORMAL HIGH (ref 70–99)
Potassium: 3.1 mEq/L — ABNORMAL LOW (ref 3.5–5.1)
Sodium: 138 mEq/L (ref 135–145)

## 2017-03-31 LAB — PROTIME-INR
INR: 3.01
Prothrombin Time: 31.9 seconds — ABNORMAL HIGH (ref 11.4–15.2)

## 2017-03-31 LAB — APTT: aPTT: 39 seconds — ABNORMAL HIGH (ref 24–36)

## 2017-03-31 NOTE — Assessment & Plan Note (Signed)
Chronic problem.  Currently asymptomatic.  Check labs.  Adjust meds prn  

## 2017-03-31 NOTE — ED Provider Notes (Signed)
[facility]-EMERGENCY DEPT Provider Note   CSN: [id number] Arrival date & time: 03/31/17  1736     History   Chief Complaint Chief Complaint  Patient presents with  . Aphasia    HPI [name] is a 83 y.o. female.  -year-old female who presents with one week of weakness that she noticed in a doctor's office when she went to stand the weakness has persisted.  She also noted at that time that her speech was slightly slurred and she had weakness of her right upper arm having trouble pushing buttons or turning knobs Today at her doctor's office.  They were concerned about acute stroke center for outpatient CT scan and then evaluation in the emergency department. Patient states that she is not having any difficulty swallowing,sleeping or ambulating.  Denies any headache, visual disturbance.  Her husband states that she appears to be off balance when she first stands No recent illnesses Has a history of atrial fibrillation on chronic anticoagulation, CVA in 2010.  Diabetes.  Hyperlipidemia and hypertension.  She states her been no medication changes and she is compliant with her medications      Past Medical History:  Diagnosis Date  . Atrial fibrillation ([facility])   . Chronic anticoagulation   . Chronic anticoagulation   . CVA (cerebrovascular accident) ([facility])   . Diabetes mellitus   . History of chicken pox   . Hyperlipidemia   . Hypertension     Patient Active Problem List   Diagnosis Date Noted  . Slurring of speech 03/31/2017  . Back pain 09/14/2016  . Compression fracture of thoracic vertebra ([facility]) 09/14/2016  . S/P vertebroplasty 09/14/2016  . Chronic diastolic heart failure ([facility]) 09/14/2016  . HTN (hypertension) 09/14/2016  . History of CVA (cerebrovascular accident) 09/14/2016  . Hypokalemia 09/11/2016  . Paraspinal hematoma 09/11/2016  . Osteoporosis 05/27/2016  . Hypothyroidism 09/11/2014  . Encounter for therapeutic drug monitoring 11/28/2013  .  Persistent atrial fibrillation ([facility]) 12/15/2012  . HLD (hyperlipidemia) 03/27/2011    Past Surgical History:  Procedure Laterality Date  . APPENDECTOMY    . CARDIAC CATHETERIZATION  11/09/91   EF 70%  . DILATION AND CURETTAGE OF UTERUS      OB History    No data available       Home Medications    Prior to Admission medications   Medication Sig Start Date End Date Taking? Authorizing Provider  alendronate (FOSAMAX) 70 MG tablet Take 1 tablet (70 mg total) by mouth every 7 (seven) days. Take with a full glass of water on an empty stomach. 05/27/16  Yes Sheliah Hatch, MD  amLODipine (NORVASC) 5 MG tablet TAKE ONE TABLET BY MOUTH ONCE DAILY 03/16/17  Yes Rosalio Macadamia, NP  atorvastatin (LIPITOR) 20 MG tablet Take 0.5 tablets (10 mg total) by mouth every morning. 03/03/17  Yes Chilton Si, MD  Calcium Carbonate-Vitamin D (CALCIUM 600 + D PO) Take 1 tablet by mouth daily.    Yes [provider]  diazepam (VALIUM) 2 MG tablet Take 1 tablet (2 mg total) by mouth 2 (two) times daily as needed for anxiety. 01/08/17  Yes Sheliah Hatch, MD  furosemide (LASIX) 20 MG tablet Take 10 mg by mouth as needed for fluid or edema.   Yes [provider]  levothyroxine (SYNTHROID, LEVOTHROID) 25 MCG tablet TAKE ONE TABLET BY MOUTH ONCE DAILY BEFORE BREAKFAST 01/20/17  Yes Rosalio Macadamia, NP  mirabegron ER (MYRBETRIQ) 25 MG TB24 tablet Take 25  mg by mouth daily. Reported on 04/29/2016   Yes [provider]  nadolol (CORGARD) 20 MG tablet Take 0.5 tablets (10 mg total) by mouth daily. 12/15/16  Yes Chilton Siandolph, Tiffany, MD  nitroGLYCERIN (NITROSTAT) 0.4 MG SL tablet Place 0.4 mg under the tongue every 5 (five) minutes as needed (chest pain).    Yes [provider]  oxyCODONE-acetaminophen (PERCOCET/ROXICET) 5-325 MG tablet Take by mouth every 4 (four) hours as needed for severe pain.   Yes [provider]  potassium chloride (K-DUR) 10 MEQ tablet Take 1  tablet (10 mEq total) by mouth daily. 03/09/16  Yes Rosalio MacadamiaGerhardt, Lori C, NP  tiZANidine (ZANAFLEX) 2 MG tablet TAKE ONE TABLET BY MOUTH EVERY 8 HOURS AS NEEDED FOR MUSCLE SPASM 03/19/17  Yes Sheliah Hatchabori, Katherine E, MD  Vitamin D, Ergocalciferol, (DRISDOL) 50000 units CAPS capsule Take 1 capsule (50,000 Units total) by mouth 3 (three) times a week. 03/24/17 04/18/17 Yes Romero BellingEllison, Sean, MD  warfarin (COUMADIN) 5 MG tablet TAKE TABLET BY MOUTH AS DIRECTED BY COUMADIN CLINIC Patient taking differently: TAKE TABLET BY MOUTH AS DIRECTED BY COUMADIN CLINIC take 5mg  mon. wed. and friday. 2.5 tues thurs sat. and sunday 06/24/16  Yes Chilton Siandolph, Tiffany, MD    Family History Family History  Problem Relation Age of Onset  . Heart disease Mother   . Hypertension Mother   . Stroke Mother   . Heart disease Father   . Heart failure Brother   . Osteoporosis Neg Hx     Social History Social History  Substance Use Topics  . Smoking status: Former Smoker    Quit date: 10/19/1998  . Smokeless tobacco: Never Used  . Alcohol use No     Allergies   Cozaar; Hydralazine; Hyzaar [losartan potassium-hctz]; Sulfa drugs cross reactors; and Lisinopril   Review of Systems Review of Systems  Constitutional: Negative for fever.  Respiratory: Negative for shortness of breath.   Cardiovascular: Negative for chest pain.  Musculoskeletal: Positive for back pain.  Neurological: Positive for weakness. Negative for headaches.  All other systems reviewed and are negative.    Physical Exam Updated Vital Signs BP 134/84   Pulse 77   Temp 97.8 F (36.6 C)   Resp (!) 22   SpO2 97%   Physical Exam  Constitutional: She is oriented to person, place, and time. She appears well-developed and well-nourished. No distress.  HENT:  Head: Normocephalic.  Nose: Nose normal.  Eyes: Pupils are equal, round, and reactive to light.  Neck: Normal range of motion.  Cardiovascular: Normal rate.   Pulmonary/Chest: Effort normal.    Neurological: She is alert and oriented to person, place, and time.  Speech is slightly muffled Slight weakness of grasp on right  Nursing note and vitals reviewed.    ED Treatments / Results  Labs (all labs ordered are listed, but only abnormal results are displayed) Labs Reviewed  PROTIME-INR - Abnormal; Notable for the following:       Result Value   Prothrombin Time 31.9 (*)    All other components within normal limits  APTT - Abnormal; Notable for the following:    aPTT 39 (*)    All other components within normal limits  CBC - Abnormal; Notable for the following:    Hemoglobin 15.9 (*)    HCT 48.1 (*)    All other components within normal limits  COMPREHENSIVE METABOLIC PANEL - Abnormal; Notable for the following:    Potassium 3.3 (*)    Chloride 98 (*)  Glucose, Bld 117 (*)    Creatinine, Ser 1.22 (*)    Total Bilirubin 1.3 (*)    GFR calc non Af Amer 39 (*)    GFR calc Af Amer 45 (*)    All other components within normal limits  I-STAT CHEM 8, ED - Abnormal; Notable for the following:    Potassium 3.3 (*)    Chloride 98 (*)    Creatinine, Ser 1.10 (*)    Glucose, Bld 117 (*)    Calcium, Ion 1.03 (*)    Hemoglobin 16.7 (*)    HCT 49.0 (*)    All other components within normal limits  DIFFERENTIAL  I-STAT TROPOININ, ED    EKG  EKG Interpretation None       Radiology Ct Head Wo Contrast  Result Date: 03/31/2017 CLINICAL DATA:  Left facial droop, right arm weakness x1 week and slurring of speech. EXAM: CT HEAD WITHOUT CONTRAST TECHNIQUE: Contiguous axial images were obtained from the base of the skull through the vertex without intravenous contrast. COMPARISON:  MRI 04/16/2006 FINDINGS: Brain: Chronic remote infarct of the right cerebellum with 15 mm focus of encephalomalacia. Chronic moderate degree of small vessel ischemia in the periventricular and subcortical white matter. Cerebral atrophy with mild moderate sulcal and ventricular prominence. No  intra-axial mass nor extra-axial fluid collections. No large vascular territory infarcts. Small insular and basal ganglial hypodensities may represent chronic mild prominence perivascular CSF space or tiny lacunar infarcts. Vascular: Moderate atherosclerosis of the carotid siphons. No hyperdense appearing vessels. Skull: Negative for fracture or focal lesion. Sinuses/Orbits: Bilateral lens surgeries. Intact orbits and globes. Clear mastoids bilaterally. Minimal mucosal thickening of the ethmoid sinus. Other: None IMPRESSION: 1. Chronic cerebral atrophy with moderate degree of small vessel ischemia. 2. Chronic right cerebellar infarct with encephalomalacia. 3. No acute intracranial appearing abnormality. Electronically Signed   By: Tollie Eth M.D.   On: 03/31/2017 16:45    Procedures Procedures (including critical care time)  Medications Ordered in ED Medications - No data to display   Initial Impression / Assessment and Plan / ED Course  I have reviewed the triage vital signs and the nursing notes.  Pertinent labs & imaging results that were available during my care of the patient were reviewed by me and considered in my medical decision making (see chart for details).     Patient symptoms are very minor with slurred slightly slurred speech and trouble turning lamps ct scan shows no acute stroke.  INR is 3 so she is sufficiently anticoagulated for atrial fibrillation.  After discussion with Dr. Jeraldine Loots is felt she is safe to go home and follow-up with her primary care physician  Final Clinical Impressions(s) / ED Diagnoses   Final diagnoses:  Weakness  Slurred speech    New Prescriptions New Prescriptions   No medications on file     Earley Favor, NP 03/31/17 2124    Gerhard Munch, MD 04/01/17 1235

## 2017-03-31 NOTE — Assessment & Plan Note (Signed)
Chronic problem.  Tolerating statin w/o difficulty.  Check labs.  Adjust meds prn  

## 2017-03-31 NOTE — ED Triage Notes (Signed)
Pt presents to the ed for slurred speech and being off balance for a week now. She is also having disc problems in her back and is having pain from that, she went to the doctor for that today and they sent her here for her slurred speech. History of tia

## 2017-03-31 NOTE — Progress Notes (Signed)
   Subjective:    Patient ID: [name], female    DOB: July 13, 1930, 83 y.o.   MRN: [id number]  HPI HTN- chronic problem, on Amlodipine 5mg , Lasix 20mg  daily, nadolol 10mg  daily w/ good control.  No CP, SOB, HAs, visual changes, edema.  Hyperlipidemia- chronic problem, on Lipitor 20mg  daily.  Denies abd pain, N/V, myalgias  Hypothyroid- chronic problem, on Levothyroxine 25mcg daily.  Denies fatigue, changes to skin/hair/nails  Possible stroke- pt has hx of CVA.  States that she was at Dr George HughEllison's office 1 week ago for osteoporosis when she stood up and got weak/dizzy.  Since then has had R arm weakness and slurred speech.  On Coumadin.  Review of Systems For ROS see HPI     Objective:   Physical Exam  Constitutional: She is oriented to person, place, and time. She appears well-developed and well-nourished. No distress.  HENT:  Head: Normocephalic and atraumatic.  Eyes: Conjunctivae and EOM are normal. Pupils are equal, round, and reactive to light.  Neck: Normal range of motion. Neck supple. No thyromegaly present.  Cardiovascular: Normal rate, normal heart sounds and intact distal pulses.   No murmur heard. Irregular S1/S2  Pulmonary/Chest: Effort normal and breath sounds normal. No respiratory distress.  Abdominal: Soft. She exhibits no distension. There is no tenderness.  Musculoskeletal: She exhibits no edema.  Lymphadenopathy:    She has no cervical adenopathy.  Neurological: She is alert and oriented to person, place, and time. A cranial nerve deficit (tongue is deviating to R, unable to shrug L shoulder, L facial droop) is present. Coordination (shuffling gait) abnormal.  Skin: Skin is warm and dry.  Psychiatric: She has a normal mood and affect. Her behavior is normal.  Vitals reviewed.         Assessment & Plan:

## 2017-03-31 NOTE — Discharge Instructions (Signed)
Tonight your labs are normal your INR is 3, your CT Scan shows no acute findings. Please make an appointment with your PCP for follow up evaluations

## 2017-03-31 NOTE — Assessment & Plan Note (Signed)
Chronic problem.  Well controlled today.  Hx of good control but it sounds as if she had an acute CVA on 6/6.  Check labs.  No plans to adjust meds at this time.  Will follow.

## 2017-03-31 NOTE — Patient Instructions (Addendum)
Schedule your Annual Wellness Visit w/ Selena BattenKim at your convenience and a follow up with me in 6 weeks We'll call you with your CT results and Neuro appt for the suspected stroke If you have any change in speech, additional weakness, confusion, or any other concerns, CALL 911!!! Call with any questions or concerns Hang in there!!!

## 2017-03-31 NOTE — Progress Notes (Signed)
Pre visit review using our clinic review tool, if applicable. No additional management support is needed unless otherwise documented below in the visit note. 

## 2017-03-31 NOTE — ED Notes (Signed)
The pts family is very angry  He wants to take his wife home  No discharge instructions are written.  edp is tied up with a cpr he keeps pacing in the hallway  He is talking about the poor service that is here

## 2017-03-31 NOTE — Assessment & Plan Note (Signed)
New.  Pt reports she was last 'normal' on 6/6 while at Endocrinology.  When she got out of her chair in the waiting room, she felt weak and unsteady and was placed in a wheelchair.  Husband reports that by the time they returned home from appt she was slurring her speech and drooling from L sided facial droop.  Today, pt still has L sided facial droop, tongue deviates towards right when sticking out, and absence of L shoulder shrug.  These findings in combination w/ hx of CVA and chronic use of coumadin are very concerning for acute stroke.  Attempted to get stat head CT and stat neuro appt but neuro indicated that pt needs to first have ER w/u.  Called and spoke w/ ER RN to alert them to the situation- pt name and DOB given.  Husband refused EMS transport and indicated he would take her [place].  Will follow closely.

## 2017-03-31 NOTE — ED Notes (Signed)
Pt checked in for a CT of a head. It was an order by their doctor. Pt is here to check in to see the ER doctor for an evaluation. Pt has had stroke symptoms since June 6.

## 2017-04-01 ENCOUNTER — Other Ambulatory Visit: Payer: Self-pay | Admitting: *Deleted

## 2017-04-01 ENCOUNTER — Telehealth: Payer: Self-pay | Admitting: Family Medicine

## 2017-04-01 NOTE — Telephone Encounter (Signed)
Nothing acute (however pt had her sxs x7 days). Chronic atrophy w/ small vessel ischemia (decreased blood flow) Old R cerebellar stroke seen  Recommend follow up with neuro ASAP b/c she had all signs and sxs of a stroke

## 2017-04-01 NOTE — Telephone Encounter (Signed)
Patient is aware of CT scan.  She is upset that she has not been called about her labs from the ER.  She has called them and left a message asking for results.  No one has called her and she said that she just wants to make sure everything is okay with her labs

## 2017-04-01 NOTE — Telephone Encounter (Signed)
Patient is aware that if anything is abnormal with any of her labs she will be notified.  Patient appreciated the call back.

## 2017-04-01 NOTE — Telephone Encounter (Signed)
Pt called wanting her results from CT scan from yesterday

## 2017-04-05 ENCOUNTER — Other Ambulatory Visit: Payer: Self-pay | Admitting: Family Medicine

## 2017-04-05 DIAGNOSIS — E876 Hypokalemia: Secondary | ICD-10-CM

## 2017-04-07 ENCOUNTER — Ambulatory Visit (INDEPENDENT_AMBULATORY_CARE_PROVIDER_SITE_OTHER): Payer: Medicare Other | Admitting: *Deleted

## 2017-04-07 DIAGNOSIS — Z5181 Encounter for therapeutic drug level monitoring: Secondary | ICD-10-CM | POA: Diagnosis not present

## 2017-04-07 LAB — POCT INR: INR: 3.1

## 2017-04-12 ENCOUNTER — Other Ambulatory Visit (INDEPENDENT_AMBULATORY_CARE_PROVIDER_SITE_OTHER): Payer: Medicare Other

## 2017-04-12 DIAGNOSIS — E876 Hypokalemia: Secondary | ICD-10-CM | POA: Diagnosis not present

## 2017-04-12 LAB — BASIC METABOLIC PANEL
BUN: 10 mg/dL (ref 6–23)
CO2: 30 mEq/L (ref 19–32)
Calcium: 9.8 mg/dL (ref 8.4–10.5)
Chloride: 98 mEq/L (ref 96–112)
Creatinine, Ser: 1.23 mg/dL — ABNORMAL HIGH (ref 0.40–1.20)
GFR: 44.08 mL/min — ABNORMAL LOW (ref >60.00)
Glucose, Bld: 109 mg/dL — ABNORMAL HIGH (ref 70–99)
Potassium: 4.3 mEq/L (ref 3.5–5.1)
Sodium: 136 mEq/L (ref 135–145)

## 2017-04-22 ENCOUNTER — Ambulatory Visit (INDEPENDENT_AMBULATORY_CARE_PROVIDER_SITE_OTHER): Payer: Medicare Other | Admitting: Endocrinology

## 2017-04-22 ENCOUNTER — Encounter: Payer: Self-pay | Admitting: Endocrinology

## 2017-04-22 DIAGNOSIS — E559 Vitamin D deficiency, unspecified: Secondary | ICD-10-CM

## 2017-04-22 DIAGNOSIS — M810 Age-related osteoporosis without current pathological fracture: Secondary | ICD-10-CM | POA: Diagnosis not present

## 2017-04-22 LAB — BASIC METABOLIC PANEL
BUN: 12 mg/dL (ref 6–23)
CO2: 28 mEq/L (ref 19–32)
Calcium: 9.9 mg/dL (ref 8.4–10.5)
Chloride: 97 mEq/L (ref 96–112)
Creatinine, Ser: 1.23 mg/dL — ABNORMAL HIGH (ref 0.40–1.20)
GFR: 44.08 mL/min — ABNORMAL LOW (ref >60.00)
Glucose, Bld: 116 mg/dL — ABNORMAL HIGH (ref 70–99)
Potassium: 4.2 mEq/L (ref 3.5–5.1)
Sodium: 135 mEq/L (ref 135–145)

## 2017-04-22 NOTE — Patient Instructions (Addendum)
blood tests are requested for you today.  We'll let you know about the results. Please continue the same fosamax for now.  However, based on the blood test results, you should consider adding another medication such as "Prolia" (twice a year injection, given here at the office).   It is critically important to prevent falling down (keep floor areas well-lit, dry, and free of loose objects.  If you have a cane, walker, or wheelchair, you should use it, even for short trips around the house.  Wear flat-soled shoes.  Also, try not to rush).   Take calcium 1000 mg per day, and vitamin-D, 2000 units per day.   Please come back for a follow-up appointment in 6 months.

## 2017-04-22 NOTE — Progress Notes (Signed)
Subjective:    Patient ID: [name], female    DOB: 1930-05-17, 83 y.o.   MRN: [id number]  HPI Pt returns for f/u of osteoporosis (dx'ed 1997, when she presented with left hip fx, after a fall.  She was off and on fosamax from 1997-2007.  She resumed in early 2018  She had T-11 comp fx in 2017.  she has never had any other bony fracture other than these two; only possible causes found were vit-D deficiency and PMH of smoking).  She has recently finished high-dose vit-D rx.  pt states she feels no different.  Main symptom is low-back pain.   Past Medical History:  Diagnosis Date  . Atrial fibrillation (HCC)   . Chronic anticoagulation   . Chronic anticoagulation   . CVA (cerebrovascular accident) (HCC)   . Diabetes mellitus   . History of chicken pox   . Hyperlipidemia   . Hypertension     Past Surgical History:  Procedure Laterality Date  . APPENDECTOMY    . CARDIAC CATHETERIZATION  11/09/91   EF 70%  . DILATION AND CURETTAGE OF UTERUS      Social History   Social History  . Marital status: Married    Spouse name: N/A  . Number of children: N/A  . Years of education: N/A   Occupational History  . Not on file.   Social History Main Topics  . Smoking status: Former Smoker    Quit date: 10/19/1998  . Smokeless tobacco: Never Used  . Alcohol use No  . Drug use: No  . Sexual activity: Not on file   Other Topics Concern  . Not on file   Social History Narrative  . No narrative on file    Current Outpatient Prescriptions on File Prior to Visit  Medication Sig Dispense Refill  . alendronate (FOSAMAX) 70 MG tablet Take 1 tablet (70 mg total) by mouth every 7 (seven) days. Take with a full glass of water on an empty stomach. 4 tablet 11  . amLODipine (NORVASC) 5 MG tablet TAKE ONE TABLET BY MOUTH ONCE DAILY 90 tablet 3  . atorvastatin (LIPITOR) 20 MG tablet Take 0.5 tablets (10 mg total) by mouth every morning. 15 tablet 4  . Calcium Carbonate-Vitamin D  (CALCIUM 600 + D PO) Take 1 tablet by mouth daily.     . diazepam (VALIUM) 2 MG tablet Take 1 tablet (2 mg total) by mouth 2 (two) times daily as needed for anxiety. 90 tablet 0  . furosemide (LASIX) 20 MG tablet Take 10 mg by mouth as needed for fluid or edema.    Marland Kitchen levothyroxine (SYNTHROID, LEVOTHROID) 25 MCG tablet TAKE ONE TABLET BY MOUTH ONCE DAILY BEFORE BREAKFAST 90 tablet 0  . mirabegron ER (MYRBETRIQ) 25 MG TB24 tablet Take 25 mg by mouth daily. Reported on 04/29/2016    . nadolol (CORGARD) 20 MG tablet Take 0.5 tablets (10 mg total) by mouth daily. 45 tablet 1  . nitroGLYCERIN (NITROSTAT) 0.4 MG SL tablet Place 0.4 mg under the tongue every 5 (five) minutes as needed (chest pain).     Marland Kitchen oxyCODONE-acetaminophen (PERCOCET/ROXICET) 5-325 MG tablet Take by mouth every 4 (four) hours as needed for severe pain.    . potassium chloride (K-DUR) 10 MEQ tablet Take 1 tablet (10 mEq total) by mouth daily. 90 tablet 3  . tiZANidine (ZANAFLEX) 2 MG tablet TAKE ONE TABLET BY MOUTH EVERY 8 HOURS AS NEEDED FOR MUSCLE SPASM 30 tablet 3  .  warfarin (COUMADIN) 5 MG tablet TAKE TABLET BY MOUTH AS DIRECTED BY COUMADIN CLINIC (Patient taking differently: TAKE TABLET BY MOUTH AS DIRECTED BY COUMADIN CLINIC take 5mg  mon. wed. and friday. 2.5 tues thurs sat. and sunday) 90 tablet 1   No current facility-administered medications on file prior to visit.     Allergies  Allergen Reactions  . Cozaar Other (See Comments)    "Almost passed out"  . Hydralazine Other (See Comments)    "almost passed out"  . Hyzaar [Losartan Potassium-Hctz] Other (See Comments)    "almost passed out"  . Sulfa Drugs Cross Reactors Other (See Comments)    "turned me blue"   . Lisinopril     Unknown reaction, per pt    Family History  Problem Relation Age of Onset  . Heart disease Mother   . Hypertension Mother   . Stroke Mother   . Heart disease Father   . Heart failure Brother   . Osteoporosis Neg Hx     BP 112/70    Pulse 69   Ht 5\' 3"  (1.6 m)   Wt 128 lb (58.1 kg)   SpO2 97%   BMI 22.67 kg/m   Review of Systems Denies leg cramps.     Objective:   Physical Exam VITAL SIGNS:  See vs page GENERAL: no distress Gait: slow but steady.  She does not have her cane.       Assessment & Plan:  Osteoporosis, persistent Vit-D deficiency: due for recheck.  Fall risk: noncompliant with advise to use her cane.  We discussed risks.   Patient Instructions  blood tests are requested for you today.  We'll let you know about the results. Please continue the same fosamax for now.  However, based on the blood test results, you should consider adding another medication such as "Prolia" (twice a year injection, given here at the office).   It is critically important to prevent falling down (keep floor areas well-lit, dry, and free of loose objects.  If you have a cane, walker, or wheelchair, you should use it, even for short trips around the house.  Wear flat-soled shoes.  Also, try not to rush).   Take calcium 1000 mg per day, and vitamin-D, 2000 units per day.   Please come back for a follow-up appointment in 6 months.

## 2017-04-23 LAB — PTH, INTACT AND CALCIUM
Calcium: 9.8 mg/dL (ref 8.6–10.4)
PTH: 170 pg/mL — ABNORMAL HIGH (ref 14–64)

## 2017-04-23 LAB — VITAMIN D 25 HYDROXY (VIT D DEFICIENCY, FRACTURES): VITD: 70.13 ng/mL (ref 30.00–100.00)

## 2017-04-26 ENCOUNTER — Other Ambulatory Visit: Payer: Self-pay

## 2017-04-26 LAB — PTH, INTACT AND CALCIUM

## 2017-04-27 ENCOUNTER — Ambulatory Visit (INDEPENDENT_AMBULATORY_CARE_PROVIDER_SITE_OTHER): Payer: Medicare Other | Admitting: *Deleted

## 2017-04-27 DIAGNOSIS — Z5181 Encounter for therapeutic drug level monitoring: Secondary | ICD-10-CM

## 2017-04-27 LAB — POCT INR: INR: 3.2

## 2017-04-28 DIAGNOSIS — S22000A Wedge compression fracture of unspecified thoracic vertebra, initial encounter for closed fracture: Secondary | ICD-10-CM | POA: Diagnosis not present

## 2017-04-30 ENCOUNTER — Telehealth: Payer: Self-pay | Admitting: Pharmacist

## 2017-04-30 NOTE — Telephone Encounter (Signed)
Received faxed request for clearance to hold warfarin for 5 days for spinal injection with [facility]. Pt on warfarin for Afib with history of CVA. Per protocol will need lovenox bridge while coumadin on hold. Procedure unscheduled currently.   Form faxed back to number provided [phone]

## 2017-05-03 ENCOUNTER — Telehealth: Payer: Self-pay | Admitting: Family Medicine

## 2017-05-03 NOTE — Telephone Encounter (Signed)
Missed call RE prolia injection.   Wants to know how/where it's administered.  Thank you,  -LL

## 2017-05-04 ENCOUNTER — Telehealth: Payer: Self-pay

## 2017-05-04 NOTE — Telephone Encounter (Signed)
Patient called back to ask where the injection would be on the body vecause she has had trouble with a hematoma in the past- I explained that this injection would be in the fatty tissue not the muscle- patient stated an understanding- I made patient aware that this injection will cost $0

## 2017-05-04 NOTE — Telephone Encounter (Signed)
Spoke with patient and she is scheduled to come in for prolia injection 06/11/17

## 2017-05-11 ENCOUNTER — Other Ambulatory Visit: Payer: Self-pay

## 2017-05-11 ENCOUNTER — Ambulatory Visit (INDEPENDENT_AMBULATORY_CARE_PROVIDER_SITE_OTHER): Payer: Medicare Other

## 2017-05-11 ENCOUNTER — Ambulatory Visit (INDEPENDENT_AMBULATORY_CARE_PROVIDER_SITE_OTHER): Payer: Medicare Other | Admitting: *Deleted

## 2017-05-11 DIAGNOSIS — Z5181 Encounter for therapeutic drug level monitoring: Secondary | ICD-10-CM

## 2017-05-11 DIAGNOSIS — M810 Age-related osteoporosis without current pathological fracture: Secondary | ICD-10-CM

## 2017-05-11 LAB — POCT INR: INR: 3

## 2017-05-11 MED ORDER — DENOSUMAB 60 MG/ML ~~LOC~~ SOLN
60.0000 mg | Freq: Once | SUBCUTANEOUS | Status: AC
Start: 2017-05-11 — End: 2017-05-11
  Administered 2017-05-11: 60 mg via SUBCUTANEOUS

## 2017-05-11 MED ORDER — DENOSUMAB 60 MG/ML ~~LOC~~ SOLN: 60.0000 mg | Freq: Once | SUBCUTANEOUS | Status: DC

## 2017-05-12 ENCOUNTER — Other Ambulatory Visit: Payer: Self-pay | Admitting: Nurse Practitioner

## 2017-05-12 DIAGNOSIS — E039 Hypothyroidism, unspecified: Secondary | ICD-10-CM

## 2017-05-12 NOTE — Telephone Encounter (Signed)
Dr. Duke Salviaandolph is the primary cardiologist. Please address Thanks

## 2017-05-12 NOTE — Telephone Encounter (Signed)
This is Dr. Bloomfield's pt.  °

## 2017-05-13 ENCOUNTER — Ambulatory Visit: Payer: Self-pay | Admitting: Neurology

## 2017-05-14 ENCOUNTER — Other Ambulatory Visit: Payer: Self-pay | Admitting: Nurse Practitioner

## 2017-05-20 ENCOUNTER — Telehealth: Payer: Self-pay | Admitting: Cardiovascular Disease

## 2017-05-20 ENCOUNTER — Telehealth: Payer: Self-pay | Admitting: Family Medicine

## 2017-05-20 MED ORDER — LEVOTHYROXINE SODIUM 25 MCG PO TABS: ORAL_TABLET | ORAL | 0 refills | Status: DC

## 2017-05-20 NOTE — Telephone Encounter (Signed)
New message ° ° ° °Pt is calling asking for a call back. She did not say what it was about. °

## 2017-05-20 NOTE — Telephone Encounter (Signed)
Pt called to find out where she should get refill for her synthroid - was last filled by [name] as courtesy to patient, w instruction that further refills should come from PCP office going forward. Pt states [name] always used to fill this medication so she thought she should check w cardiology first. Explained to patient this is not a cardiac medication and per Lori's recommendations she should contact PCP for refills. She voiced she will contact PCP office. Aware to contact us if any further inquiries or assistance needed. Pt verbalized understanding and thanks.

## 2017-05-20 NOTE — Telephone Encounter (Signed)
Pt calling asking if KT would over see her Rx for levothyroxine, pt states that her doctor has retired and has been advise that her pcp can refill this for her. Pt is aware that KT is out of the office until Wed 8/8. She states that she will call that office and see if one more refill can be done then have KT refill next time.

## 2017-05-20 NOTE — Telephone Encounter (Signed)
Pt informed

## 2017-05-20 NOTE — Telephone Encounter (Signed)
Levothyroxine medication was filled today. TSH on 04/10/2017 were normal and per pharmacy last PPC advised no further refills by their office.

## 2017-06-01 ENCOUNTER — Ambulatory Visit (INDEPENDENT_AMBULATORY_CARE_PROVIDER_SITE_OTHER): Payer: Medicare Other | Admitting: *Deleted

## 2017-06-01 DIAGNOSIS — Z5181 Encounter for therapeutic drug level monitoring: Secondary | ICD-10-CM

## 2017-06-01 LAB — POCT INR: INR: 2.1

## 2017-06-03 ENCOUNTER — Other Ambulatory Visit (HOSPITAL_COMMUNITY): Payer: Self-pay | Admitting: Cardiology

## 2017-06-03 MED ORDER — ATORVASTATIN CALCIUM 20 MG PO TABS: 10.0000 mg | ORAL_TABLET | Freq: Every morning | ORAL | 0 refills | Status: DC

## 2017-06-08 ENCOUNTER — Other Ambulatory Visit: Payer: Self-pay | Admitting: Family Medicine

## 2017-06-08 ENCOUNTER — Other Ambulatory Visit: Payer: Self-pay | Admitting: Cardiovascular Disease

## 2017-06-08 DIAGNOSIS — M810 Age-related osteoporosis without current pathological fracture: Secondary | ICD-10-CM

## 2017-06-08 NOTE — Telephone Encounter (Signed)
Please review for refill, thanks ! 

## 2017-06-15 ENCOUNTER — Ambulatory Visit: Payer: Medicare Other

## 2017-06-16 DIAGNOSIS — M810 Age-related osteoporosis without current pathological fracture: Secondary | ICD-10-CM | POA: Diagnosis not present

## 2017-06-22 ENCOUNTER — Ambulatory Visit (INDEPENDENT_AMBULATORY_CARE_PROVIDER_SITE_OTHER): Payer: Medicare Other | Admitting: *Deleted

## 2017-06-22 DIAGNOSIS — Z8673 Personal history of transient ischemic attack (TIA), and cerebral infarction without residual deficits: Secondary | ICD-10-CM | POA: Diagnosis not present

## 2017-06-22 DIAGNOSIS — Z5181 Encounter for therapeutic drug level monitoring: Secondary | ICD-10-CM | POA: Diagnosis not present

## 2017-06-22 DIAGNOSIS — I481 Persistent atrial fibrillation: Secondary | ICD-10-CM | POA: Diagnosis not present

## 2017-06-22 DIAGNOSIS — I4819 Other persistent atrial fibrillation: Secondary | ICD-10-CM

## 2017-06-22 LAB — POCT INR: INR: 2.3

## 2017-06-22 MED ORDER — ENOXAPARIN SODIUM 60 MG/0.6ML ~~LOC~~ SOLN: 60.0000 mg | Freq: Two times a day (BID) | SUBCUTANEOUS | 1 refills | Status: DC

## 2017-06-22 NOTE — Patient Instructions (Addendum)
06/22/17: Last dose of Coumadin.  06/23/17: No Coumadin or Lovenox.  06/24/17: Inject Lovenox 60mg  in the fatty abdominal tissue at least 2 inches from the belly button twice a day about 12 hours apart, 9am and 9pm rotate sites. No Coumadin.  06/25/17: Inject Lovenox in the fatty tissue every 12 hours, 9am and 9pm. No Coumadin.  06/26/17: Inject Lovenox in the fatty tissue every 12 hours, 9am and 9pm. No Coumadin.  06/27/17: Inject Lovenox in the fatty tissue in the morning at 9am (No PM dose). No Coumadin.  06/28/17: Procedure Day - No Lovenox - Resume Coumadin in the evening or as directed by doctor.  06/29/17: Resume Lovenox inject in the fatty tissue every 12 hours, 9am and 9pm and take Coumadin.  06/30/17: Inject Lovenox in the fatty tissue every 12 hours, 9am and 9pm and take Coumadin.  07/01/17: Inject Lovenox in the fatty tissue every 12 hours, 9am and 9pm and take Coumadin.  07/02/17: Inject Lovenox in the fatty tissue every 12 hours, 9am and 9pm and take Coumadin.  07/03/17: Inject Lovenox in the fatty tissue every 12 hours,9am and 9pm and take Coumadin.  07/04/17: Inject Lovenox in the fatty tissue every 12 hours, 9am and 9pm and take Coumadin  07/05/17: Inject Lovenox in the fatty tissue every at 9am & report to Coumadin appt to check INR.

## 2017-06-28 DIAGNOSIS — M546 Pain in thoracic spine: Secondary | ICD-10-CM | POA: Diagnosis not present

## 2017-06-28 DIAGNOSIS — G8929 Other chronic pain: Secondary | ICD-10-CM | POA: Diagnosis not present

## 2017-07-05 ENCOUNTER — Ambulatory Visit (INDEPENDENT_AMBULATORY_CARE_PROVIDER_SITE_OTHER): Payer: Medicare Other

## 2017-07-05 DIAGNOSIS — Z5181 Encounter for therapeutic drug level monitoring: Secondary | ICD-10-CM

## 2017-07-05 DIAGNOSIS — Z8673 Personal history of transient ischemic attack (TIA), and cerebral infarction without residual deficits: Secondary | ICD-10-CM | POA: Diagnosis not present

## 2017-07-05 LAB — POCT INR: INR: 1.7

## 2017-07-12 ENCOUNTER — Ambulatory Visit (INDEPENDENT_AMBULATORY_CARE_PROVIDER_SITE_OTHER): Payer: Medicare Other | Admitting: Pharmacist

## 2017-07-12 DIAGNOSIS — Z5181 Encounter for therapeutic drug level monitoring: Secondary | ICD-10-CM | POA: Diagnosis not present

## 2017-07-12 LAB — POCT INR: INR: 1.5

## 2017-07-16 ENCOUNTER — Other Ambulatory Visit: Payer: Self-pay | Admitting: Family Medicine

## 2017-07-20 ENCOUNTER — Other Ambulatory Visit: Payer: Self-pay | Admitting: Cardiovascular Disease

## 2017-07-20 NOTE — Telephone Encounter (Signed)
Please review for refill, Thanks !  

## 2017-07-21 ENCOUNTER — Ambulatory Visit (INDEPENDENT_AMBULATORY_CARE_PROVIDER_SITE_OTHER): Payer: Medicare Other | Admitting: *Deleted

## 2017-07-21 DIAGNOSIS — Z5181 Encounter for therapeutic drug level monitoring: Secondary | ICD-10-CM

## 2017-07-21 DIAGNOSIS — Z8673 Personal history of transient ischemic attack (TIA), and cerebral infarction without residual deficits: Secondary | ICD-10-CM

## 2017-07-21 LAB — POCT INR: INR: 2.2

## 2017-07-23 ENCOUNTER — Other Ambulatory Visit: Payer: Self-pay

## 2017-07-23 MED ORDER — ATORVASTATIN CALCIUM 20 MG PO TABS: 10.0000 mg | ORAL_TABLET | Freq: Every morning | ORAL | 6 refills | Status: DC

## 2017-07-28 DIAGNOSIS — M546 Pain in thoracic spine: Secondary | ICD-10-CM | POA: Diagnosis not present

## 2017-08-04 ENCOUNTER — Ambulatory Visit (INDEPENDENT_AMBULATORY_CARE_PROVIDER_SITE_OTHER): Payer: Medicare Other | Admitting: *Deleted

## 2017-08-04 DIAGNOSIS — I481 Persistent atrial fibrillation: Secondary | ICD-10-CM | POA: Diagnosis not present

## 2017-08-04 DIAGNOSIS — I4819 Other persistent atrial fibrillation: Secondary | ICD-10-CM

## 2017-08-04 DIAGNOSIS — Z8673 Personal history of transient ischemic attack (TIA), and cerebral infarction without residual deficits: Secondary | ICD-10-CM | POA: Diagnosis not present

## 2017-08-04 DIAGNOSIS — Z5181 Encounter for therapeutic drug level monitoring: Secondary | ICD-10-CM | POA: Diagnosis not present

## 2017-08-04 LAB — POCT INR: INR: 1.9

## 2017-08-18 ENCOUNTER — Ambulatory Visit (INDEPENDENT_AMBULATORY_CARE_PROVIDER_SITE_OTHER): Payer: Medicare Other | Admitting: Pharmacist Clinician (PhC)/ Clinical Pharmacy Specialist

## 2017-08-18 DIAGNOSIS — Z8673 Personal history of transient ischemic attack (TIA), and cerebral infarction without residual deficits: Secondary | ICD-10-CM

## 2017-08-18 DIAGNOSIS — Z5181 Encounter for therapeutic drug level monitoring: Secondary | ICD-10-CM

## 2017-08-18 LAB — POCT INR: INR: 1.6

## 2017-09-01 ENCOUNTER — Other Ambulatory Visit: Payer: Self-pay | Admitting: Family Medicine

## 2017-09-01 ENCOUNTER — Ambulatory Visit (INDEPENDENT_AMBULATORY_CARE_PROVIDER_SITE_OTHER): Payer: Medicare Other

## 2017-09-01 DIAGNOSIS — Z8673 Personal history of transient ischemic attack (TIA), and cerebral infarction without residual deficits: Secondary | ICD-10-CM | POA: Diagnosis not present

## 2017-09-01 DIAGNOSIS — Z5181 Encounter for therapeutic drug level monitoring: Secondary | ICD-10-CM

## 2017-09-01 DIAGNOSIS — M546 Pain in thoracic spine: Secondary | ICD-10-CM | POA: Diagnosis not present

## 2017-09-01 LAB — POCT INR: INR: 2.4

## 2017-09-01 NOTE — Patient Instructions (Signed)
Continue on same dosage 1 tablet daily except 1/2 tablet each Tuesday, Thursday and Saturday.  Repeat INR in 3 weeks

## 2017-09-01 NOTE — Telephone Encounter (Signed)
Please advise if ok to fill? Pt last saw you on 03/31/17, per that note she was to follow up with you in 6 weeks due to suspected stroke. Pt has not had any appts with you since then and nothing scheduled.

## 2017-09-02 NOTE — Telephone Encounter (Signed)
Ok for refill- please encourage pt to schedule a f/u next month for a 6 month visit

## 2017-09-03 NOTE — Telephone Encounter (Signed)
Called pt and LMOVM for her to call office and schedule a 6 month follow up with Dr. Beverely Lowabori next month.    [facility] / Schedule patient.

## 2017-09-09 ENCOUNTER — Encounter (HOSPITAL_COMMUNITY): Payer: Self-pay | Admitting: *Deleted

## 2017-09-09 ENCOUNTER — Emergency Department (HOSPITAL_COMMUNITY): Payer: Medicare Other

## 2017-09-09 ENCOUNTER — Emergency Department (HOSPITAL_COMMUNITY)
Admission: EM | Admit: 2017-09-09 | Discharge: 2017-09-09 | Disposition: A | Discharge status: Home / Self Care | Payer: Medicare Other | Attending: Emergency Medicine | Admitting: Emergency Medicine

## 2017-09-09 DIAGNOSIS — Z8673 Personal history of transient ischemic attack (TIA), and cerebral infarction without residual deficits: Secondary | ICD-10-CM | POA: Diagnosis not present

## 2017-09-09 DIAGNOSIS — E785 Hyperlipidemia, unspecified: Secondary | ICD-10-CM | POA: Diagnosis not present

## 2017-09-09 DIAGNOSIS — Z7901 Long term (current) use of anticoagulants: Secondary | ICD-10-CM | POA: Insufficient documentation

## 2017-09-09 DIAGNOSIS — S52692A Other fracture of lower end of left ulna, initial encounter for closed fracture: Secondary | ICD-10-CM | POA: Diagnosis not present

## 2017-09-09 DIAGNOSIS — E119 Type 2 diabetes mellitus without complications: Secondary | ICD-10-CM | POA: Insufficient documentation

## 2017-09-09 DIAGNOSIS — I110 Hypertensive heart disease with heart failure: Secondary | ICD-10-CM | POA: Insufficient documentation

## 2017-09-09 DIAGNOSIS — S52602A Unspecified fracture of lower end of left ulna, initial encounter for closed fracture: Secondary | ICD-10-CM

## 2017-09-09 DIAGNOSIS — Z87891 Personal history of nicotine dependence: Secondary | ICD-10-CM | POA: Diagnosis not present

## 2017-09-09 DIAGNOSIS — I5032 Chronic diastolic (congestive) heart failure: Secondary | ICD-10-CM | POA: Diagnosis not present

## 2017-09-09 DIAGNOSIS — S52502A Unspecified fracture of the lower end of left radius, initial encounter for closed fracture: Secondary | ICD-10-CM | POA: Diagnosis not present

## 2017-09-09 DIAGNOSIS — W010XXA Fall on same level from slipping, tripping and stumbling without subsequent striking against object, initial encounter: Secondary | ICD-10-CM | POA: Diagnosis not present

## 2017-09-09 DIAGNOSIS — Y929 Unspecified place or not applicable: Secondary | ICD-10-CM | POA: Insufficient documentation

## 2017-09-09 DIAGNOSIS — S6992XA Unspecified injury of left wrist, hand and finger(s), initial encounter: Secondary | ICD-10-CM | POA: Diagnosis present

## 2017-09-09 DIAGNOSIS — E039 Hypothyroidism, unspecified: Secondary | ICD-10-CM | POA: Insufficient documentation

## 2017-09-09 DIAGNOSIS — Y998 Other external cause status: Secondary | ICD-10-CM | POA: Insufficient documentation

## 2017-09-09 DIAGNOSIS — I480 Paroxysmal atrial fibrillation: Secondary | ICD-10-CM | POA: Diagnosis not present

## 2017-09-09 DIAGNOSIS — Z79899 Other long term (current) drug therapy: Secondary | ICD-10-CM | POA: Insufficient documentation

## 2017-09-09 DIAGNOSIS — Y939 Activity, unspecified: Secondary | ICD-10-CM | POA: Insufficient documentation

## 2017-09-09 DIAGNOSIS — S52612A Displaced fracture of left ulna styloid process, initial encounter for closed fracture: Secondary | ICD-10-CM | POA: Diagnosis not present

## 2017-09-09 MED ORDER — OXYCODONE-ACETAMINOPHEN 5-325 MG PO TABS
1.0000 | ORAL_TABLET | Freq: Once | ORAL | Status: AC
  Administered 2017-09-09: 1 via ORAL
  Filled 2017-09-09: qty 1

## 2017-09-09 NOTE — ED Notes (Signed)
States she was trying to straighten a rug last pm and lost her balance and fell backwards. States she put her left hand back and landed on her wrist. C/o pain and swelling to left wrist. Bruising noted. C/o lower back pain however states she is currently being treated for her back problems.

## 2017-09-09 NOTE — Discharge Instructions (Addendum)
It was our pleasure to provide your ER care today - we hope that you feel better.  Wear splint. Elevate arm/wrist.   Icepack/cold to sore area.   Take your pain medication as need.  Follow up with your orthopedist in the coming week - call office tomorrow morning to arrange appointment.   Return to ER if worse, new symptoms, intractable pain, headache, other concern.

## 2017-09-09 NOTE — ED Provider Notes (Signed)
[facility] EMERGENCY DEPARTMENT Provider Note   CSN: [id number] Arrival date & time: 09/09/17  1134     History   Chief Complaint Chief Complaint  Patient presents with  . Fall  . Hand Pain  . Wrist Pain    HPI [name] [name] is a 83 y.o. female.  Patient c/o fall at home yesterday. Patient was trying to move/adjust position of run, and fell back onto outstretched left wrist/arm. C/o left wrist pain since fall. Pain is constant, dull, moderate. Worse w palpation. Skin intact. Denies numbness/weakness. Is right hand dominant. No faintness or dizziness prior to fall, states just lost balance when adjusting rug. Denies hitting head. No headache. No neck or back injury.  Pt indicates otherwise feels well/normal, at her baseline.   The history is provided by the patient.  Fall  Pertinent negatives include no chest pain, no abdominal pain, no headaches and no shortness of breath.  Hand Pain  Pertinent negatives include no chest pain, no abdominal pain, no headaches and no shortness of breath.  Wrist Pain  Pertinent negatives include no chest pain, no abdominal pain, no headaches and no shortness of breath.    Past Medical History:  Diagnosis Date  . Atrial fibrillation (HCC)   . Chronic anticoagulation   . Chronic anticoagulation   . CVA (cerebrovascular accident) (HCC)   . Diabetes mellitus   . History of chicken pox   . Hyperlipidemia   . Hypertension     Patient Active Problem List   Diagnosis Date Noted  . Vitamin D deficiency 04/22/2017  . Slurring of speech 03/31/2017  . Back pain 09/14/2016  . Compression fracture of thoracic vertebra (HCC) 09/14/2016  . S/P vertebroplasty 09/14/2016  . Chronic diastolic heart failure (HCC) 09/14/2016  . HTN (hypertension) 09/14/2016  . History of CVA (cerebrovascular accident) 09/14/2016  . Hypokalemia 09/11/2016  . Paraspinal hematoma 09/11/2016  . Osteoporosis 05/27/2016  . Hypothyroidism  09/11/2014  . Encounter for therapeutic drug monitoring 11/28/2013  . Persistent atrial fibrillation (HCC) 12/15/2012  . HLD (hyperlipidemia) 03/27/2011    Past Surgical History:  Procedure Laterality Date  . APPENDECTOMY    . BACK SURGERY    . CARDIAC CATHETERIZATION  11/09/91   EF 70%  . DILATION AND CURETTAGE OF UTERUS      OB History    No data available       Home Medications    Prior to Admission medications   Medication Sig Start Date End Date Taking? Authorizing Provider  alendronate (FOSAMAX) 70 MG tablet TAKE ONE TABLET BY MOUTH EVERY 7 DAYS WITH A FULL GLASS OF WATER ON AN EMPTY STOMACH 06/09/17   Sheliah Hatch, MD  amLODipine (NORVASC) 5 MG tablet TAKE ONE TABLET BY MOUTH ONCE DAILY 03/16/17   Rosalio Macadamia, NP  atorvastatin (LIPITOR) 20 MG tablet Take 0.5 tablets (10 mg total) by mouth every morning. 07/23/17   Chilton Si, MD  diazepam (VALIUM) 2 MG tablet Take 1 tablet (2 mg total) by mouth 2 (two) times daily as needed for anxiety. 01/08/17   Sheliah Hatch, MD  DULoxetine (CYMBALTA) 30 MG capsule Take 30 mg by mouth daily.    [provider]  furosemide (LASIX) 20 MG tablet Take 10 mg by mouth as needed for fluid or edema.    [provider]  levothyroxine (SYNTHROID, LEVOTHROID) 25 MCG tablet TAKE 1 TABLET BY MOUTH ONCE DAILY BEFORE BREAKFAST 09/02/17   Sheliah Hatch, MD  mirabegron ER (MYRBETRIQ) 25 MG TB24 tablet Take 25 mg by mouth daily. Reported on 04/29/2016    [provider]  nadolol (CORGARD) 20 MG tablet TAKE 1/2 (ONE-HALF) TABLET BY MOUTH ONCE DAILY 06/08/17   Chilton Siandolph, Tiffany, MD  nitroGLYCERIN (NITROSTAT) 0.4 MG SL tablet Place 0.4 mg under the tongue every 5 (five) minutes as needed (chest pain).     [provider]  oxyCODONE-acetaminophen (PERCOCET/ROXICET) 5-325 MG tablet Take by mouth every 4 (four) hours as needed for severe pain.    [provider]  potassium chloride (K-DUR) 10  MEQ tablet TAKE ONE TABLET BY MOUTH ONCE DAILY 05/12/17   Chilton Siandolph, Tiffany, MD  tiZANidine (ZANAFLEX) 2 MG tablet TAKE 1 TABLET BY MOUTH EVERY 8 HOURS AS NEEDED FOR MUSCLE SPASM 07/16/17   Sheliah Hatchabori, Katherine E, MD  warfarin (COUMADIN) 5 MG tablet TAKE TABLET BY MOUTH AS DIRECTED BY COUMADIN CLINIC 07/20/17   Chilton Siandolph, Tiffany, MD    Family History Family History  Problem Relation Age of Onset  . Heart disease Mother   . Hypertension Mother   . Stroke Mother   . Heart disease Father   . Heart failure Brother   . Osteoporosis Neg Hx     Social History Social History   Tobacco Use  . Smoking status: Former Smoker    Last attempt to quit: 10/19/1998    Years since quitting: 18.9  . Smokeless tobacco: Never Used  Substance Use Topics  . Alcohol use: No  . Drug use: No     Allergies   Cozaar; Hydralazine; Hyzaar [losartan potassium-hctz]; Sulfa drugs cross reactors; and Lisinopril   Review of Systems Review of Systems  Constitutional: Negative for fever.  HENT: Negative for sore throat.   Eyes: Negative for redness.  Respiratory: Negative for shortness of breath.   Cardiovascular: Negative for chest pain.  Gastrointestinal: Negative for abdominal pain.  Genitourinary: Negative for flank pain.  Musculoskeletal: Negative for neck pain.  Skin: Negative for wound.  Neurological: Negative for weakness, numbness and headaches.  Hematological:       Is on anticoag therapy, no abn bruising or bleeding.   Psychiatric/Behavioral: Negative for confusion.     Physical Exam Updated Vital Signs BP (!) 176/58 (BP Location: Left Arm)   Pulse 86   Temp 97.9 F (36.6 C) (Oral)   Resp 15   SpO2 96%   Physical Exam  Constitutional: She appears well-developed and well-nourished. No distress.  HENT:  Head: Atraumatic.  Eyes: Conjunctivae are normal. Pupils are equal, round, and reactive to light. No scleral icterus.  Neck: Neck supple. No tracheal deviation present.  Cardiovascular:  Normal rate, normal heart sounds and intact distal pulses.  Pulmonary/Chest: Effort normal and breath sounds normal. No respiratory distress.  Abdominal: Normal appearance. She exhibits no distension. There is no tenderness.  Musculoskeletal:  sts and tenderness left wrist. Radial pulse 2+. Skin intact. CTLS spine, non tender, aligned, no step off.   Neurological: She is alert.  Speech normal. Motor intact bil. L hand, rad/med/uln n fxn intact, motor/sens.   Skin: Skin is warm and dry. No rash noted. She is not diaphoretic.  Psychiatric: She has a normal mood and affect.  Nursing note and vitals reviewed.    ED Treatments / Results  Labs (all labs ordered are listed, but only abnormal results are displayed) Labs Reviewed - No data to display  EKG  EKG Interpretation None       Radiology Dg Wrist Complete Left  Result Date: 09/09/2017 CLINICAL DATA:  Fall. EXAM: LEFT WRIST - COMPLETE 3+ VIEW COMPARISON:  None. FINDINGS: Acute, impacted fracture of the distal radius with slight dorsal angulation and mild radial displacement. Minimally displaced fracture through the ulnar styloid. Degenerative changes of the [facility]. Diffuse osteopenia. Diffuse soft tissue swelling about the wrist and distal forearm. Atherosclerotic vascular calcifications. IMPRESSION: 1. Acute, impacted fracture of the distal radius with slight dorsal angulation. 2. Minimally displaced fracture through the ulnar styloid. Electronically Signed   By: Obie DredgeWilliam T Derry M.D.   On: 09/09/2017 12:53    Procedures Procedures (including critical care time)  Medications Ordered in ED Medications - No data to display   Initial Impression / Assessment and Plan / ED Course  I have reviewed the triage vital signs and the nursing notes.  Pertinent labs & imaging results that were available during my care of the patient were reviewed by me and considered in my medical decision making (see chart for details).  Xrays.    Reviewed nursing notes and prior charts for additional history.   Discussed xrays w pt. sugartong splint.  Pt indicates took pain med already today, and has adequate supply at home.  Indicates will f/u at [facility], her ortho doc.     Final Clinical Impressions(s) / ED Diagnoses   Final diagnoses:  None    ED Discharge Orders    None       Cathren LaineSteinl, Devere Brem, MD 09/09/17 1314

## 2017-09-09 NOTE — ED Triage Notes (Addendum)
To ED for eval of left hand/wrist/arm pain since losing her balance and falling backwards yesterday. With brace on. Able to move digits. Unable to remove ring. Appears in nad. Per husband pt has had multiple falls over the past few weeks from unknown reason. Pt denies ever feeling dizzy

## 2017-09-09 NOTE — ED Notes (Signed)
Got patient hooked up to the monitor patient is resting with call bell in reach and family at bedside 

## 2017-09-09 NOTE — Progress Notes (Signed)
Orthopedic Tech Progress Note Patient Details:  [name] Jul 02, 1930 [id number]  Ortho Devices Type of Ortho Device: Arm sling, Ace wrap, Sugartong splint Ortho Device/Splint Interventions: Application   [name] 09/09/2017, 1:27 PM

## 2017-09-13 NOTE — ED Notes (Signed)
09/13/2017, Pt.s husband called and requested information on the Orthopedic surgeon that was recommended to his wife.  Explained to pt.'s husband , the follow-up information is on his discharge instructions.  Pt.'s husband stated, "I called but could not get through."  I explained to pt.'s husband that he may need to wait on the phone to speak to someone to schedule an appointment.  Pt. Verbalized understanding.

## 2017-09-14 DIAGNOSIS — M25532 Pain in left wrist: Secondary | ICD-10-CM | POA: Diagnosis not present

## 2017-09-15 ENCOUNTER — Other Ambulatory Visit: Payer: Self-pay | Admitting: Anesthesiology

## 2017-09-15 ENCOUNTER — Ambulatory Visit: Payer: Medicare Other

## 2017-09-15 DIAGNOSIS — G8929 Other chronic pain: Secondary | ICD-10-CM

## 2017-09-15 DIAGNOSIS — M546 Pain in thoracic spine: Principal | ICD-10-CM

## 2017-09-22 ENCOUNTER — Ambulatory Visit (INDEPENDENT_AMBULATORY_CARE_PROVIDER_SITE_OTHER): Payer: Medicare Other | Admitting: *Deleted

## 2017-09-22 DIAGNOSIS — I639 Cerebral infarction, unspecified: Secondary | ICD-10-CM

## 2017-09-22 DIAGNOSIS — Z5181 Encounter for therapeutic drug level monitoring: Secondary | ICD-10-CM | POA: Diagnosis not present

## 2017-09-22 LAB — POCT INR
INR: 2.6
INR: 2.6

## 2017-09-22 NOTE — Patient Instructions (Signed)
Continue on same dosage 1 tablet daily except 1/2 tablet each Tuesday, Thursday and Saturday.  Repeat INR in 4 weeks. Call us with any pending procedure, new medication or concerns to coumadin clinic # [phone].

## 2017-09-23 DIAGNOSIS — S42294A Other nondisplaced fracture of upper end of right humerus, initial encounter for closed fracture: Secondary | ICD-10-CM | POA: Diagnosis not present

## 2017-09-23 DIAGNOSIS — M25532 Pain in left wrist: Secondary | ICD-10-CM | POA: Diagnosis not present

## 2017-09-24 ENCOUNTER — Encounter: Payer: Self-pay | Admitting: Endocrinology

## 2017-09-24 ENCOUNTER — Ambulatory Visit (INDEPENDENT_AMBULATORY_CARE_PROVIDER_SITE_OTHER): Payer: Medicare Other | Admitting: Endocrinology

## 2017-09-24 VITALS — BP 122/80 | HR 100 | Wt 126.0 lb

## 2017-09-24 DIAGNOSIS — I639 Cerebral infarction, unspecified: Secondary | ICD-10-CM | POA: Diagnosis not present

## 2017-09-24 DIAGNOSIS — M810 Age-related osteoporosis without current pathological fracture: Secondary | ICD-10-CM

## 2017-09-24 MED ORDER — DENOSUMAB 60 MG/ML ~~LOC~~ SOLN
60.0000 mg | Freq: Once | SUBCUTANEOUS | Status: AC
  Administered 2017-09-24: 60 mg via SUBCUTANEOUS

## 2017-09-24 NOTE — Progress Notes (Signed)
Subjective:    Patient ID: [name], female    DOB: [birth year], 83 y.o.   MRN: [id number]  HPI Pt returns for f/u of osteoporosis (dx'ed 1997, when she presented with left hip fx, after a fall.  She was off and on fosamax from 1997-2007.  She resumed in early 2018  She had T-11 comp fx in 2017; only possible causes found were vit-D deficiency and PMH of smoking; she is not a candidate for Forteo, due to high PTH level).  She has recently finished high-dose vit-D rx.  pt states she feels no different.  She fx left forearm 2 weeks ago, when she fell.  She takes OTC vit-D intermittently.  She had 2nd dose of prolia today.   Past Medical History:  Diagnosis Date  . Atrial fibrillation (HCC)   . Chronic anticoagulation   . Chronic anticoagulation   . CVA (cerebrovascular accident) (HCC)   . Diabetes mellitus   . History of chicken pox   . Hyperlipidemia   . Hypertension     Past Surgical History:  Procedure Laterality Date  . APPENDECTOMY    . BACK SURGERY    . CARDIAC CATHETERIZATION  11/09/91   EF 70%  . DILATION AND CURETTAGE OF UTERUS      Social History   Socioeconomic History  . Marital status: Married    Spouse name: Not on file  . Number of children: Not on file  . Years of education: Not on file  . Highest education level: Not on file  Social Needs  . Financial resource strain: Not on file  . Food insecurity - worry: Not on file  . Food insecurity - inability: Not on file  . Transportation needs - medical: Not on file  . Transportation needs - non-medical: Not on file  Occupational History  . Not on file  Tobacco Use  . Smoking status: Former Smoker    Last attempt to quit: 10/19/1998    Years since quitting: 18.9  . Smokeless tobacco: Never Used  Substance and Sexual Activity  . Alcohol use: No  . Drug use: No  . Sexual activity: Not on file  Other Topics Concern  . Not on file  Social History Narrative  . Not on file    Current Outpatient  Medications on File Prior to Visit  Medication Sig Dispense Refill  . alendronate (FOSAMAX) 70 MG tablet TAKE ONE TABLET BY MOUTH EVERY 7 DAYS WITH A FULL GLASS OF WATER ON AN EMPTY STOMACH 4 tablet 11  . amLODipine (NORVASC) 5 MG tablet TAKE ONE TABLET BY MOUTH ONCE DAILY 90 tablet 3  . atorvastatin (LIPITOR) 20 MG tablet Take 0.5 tablets (10 mg total) by mouth every morning. 30 tablet 6  . diazepam (VALIUM) 2 MG tablet Take 1 tablet (2 mg total) by mouth 2 (two) times daily as needed for anxiety. 90 tablet 0  . DULoxetine (CYMBALTA) 30 MG capsule Take 30 mg by mouth daily.    . furosemide (LASIX) 20 MG tablet Take 10 mg by mouth as needed for fluid or edema.    Marland Kitchen. levothyroxine (SYNTHROID, LEVOTHROID) 25 MCG tablet TAKE 1 TABLET BY MOUTH ONCE DAILY BEFORE BREAKFAST 90 tablet 0  . nadolol (CORGARD) 20 MG tablet TAKE 1/2 (ONE-HALF) TABLET BY MOUTH ONCE DAILY 45 tablet 1  . nitroGLYCERIN (NITROSTAT) 0.4 MG SL tablet Place 0.4 mg under the tongue every 5 (five) minutes as needed (chest pain).     Marland Kitchen. oxyCODONE-acetaminophen (  PERCOCET/ROXICET) 5-325 MG tablet Take by mouth every 4 (four) hours as needed for severe pain.    . potassium chloride (K-DUR) 10 MEQ tablet TAKE ONE TABLET BY MOUTH ONCE DAILY 90 tablet 3  . pregabalin (LYRICA) 50 MG capsule Take 50 mg by mouth 2 (two) times daily.    Marland Kitchen. tiZANidine (ZANAFLEX) 2 MG tablet TAKE 1 TABLET BY MOUTH EVERY 8 HOURS AS NEEDED FOR MUSCLE SPASM 30 tablet 3  . warfarin (COUMADIN) 5 MG tablet TAKE TABLET BY MOUTH AS DIRECTED BY COUMADIN CLINIC 90 tablet 1   No current facility-administered medications on file prior to visit.     Allergies  Allergen Reactions  . Cozaar Other (See Comments)    "Almost passed out"  . Hydralazine Other (See Comments)    "almost passed out"  . Hyzaar [Losartan Potassium-Hctz] Other (See Comments)    "almost passed out"  . Sulfa Drugs Cross Reactors Other (See Comments)    "turned me blue"   . Lisinopril     Unknown  reaction, per pt    Family History  Problem Relation Age of Onset  . Heart disease Mother   . Hypertension Mother   . Stroke Mother   . Heart disease Father   . Heart failure Brother   . Osteoporosis Neg Hx     BP 122/80 (BP Location: Left Arm, Patient Position: Sitting, Cuff Size: Normal)   Pulse 100   Wt 126 lb (57.2 kg)   SpO2 97%   BMI 22.32 kg/m    Review of Systems Denies LOC.     Objective:   Physical Exam VITAL SIGNS:  See vs page GENERAL: no distress.  In wheelchair.   Chest wall: no kyphosis.    Lab Results  Component Value Date   TSH 2.72 03/31/2017   Lab Results  Component Value Date   PTH 170 (H) 04/22/2017   PTH CANCELED 04/22/2017   CALCIUM 9.8 04/22/2017   CALCIUM 9.9 04/22/2017   CALCIUM CANCELED 04/22/2017   CAION 1.03 (L) 03/31/2017   PHOS 3.2 09/12/2016       Assessment & Plan:  Osteoporosis, on multiple rx.  Due for recheck  Patient Instructions  Please continue the same fosamax and prolia for now.  It is critically important to prevent falling down (keep floor areas well-lit, dry, and free of loose objects.  If you have a cane, walker, or wheelchair, you should use it, even for short trips around the house.  Wear flat-soled shoes.  Also, try not to rush).   Take calcium 1000 mg per day, and vitamin-D, 2000 units per day.   Please come back for a follow-up appointment in 6 months.

## 2017-09-24 NOTE — Patient Instructions (Addendum)
Please continue the same fosamax and prolia for now.  It is critically important to prevent falling down (keep floor areas well-lit, dry, and free of loose objects.  If you have a cane, walker, or wheelchair, you should use it, even for short trips around the house.  Wear flat-soled shoes.  Also, try not to rush).   Take calcium 1000 mg per day, and vitamin-D, 2000 units per day.   Please come back for a follow-up appointment in 6 months.

## 2017-09-27 ENCOUNTER — Other Ambulatory Visit: Payer: Medicare Other

## 2017-10-01 ENCOUNTER — Other Ambulatory Visit: Payer: Medicare Other

## 2017-10-05 ENCOUNTER — Other Ambulatory Visit: Payer: Medicare Other

## 2017-10-07 DIAGNOSIS — M25511 Pain in right shoulder: Secondary | ICD-10-CM | POA: Diagnosis not present

## 2017-10-07 DIAGNOSIS — M25532 Pain in left wrist: Secondary | ICD-10-CM | POA: Diagnosis not present

## 2017-10-08 ENCOUNTER — Telehealth: Payer: Self-pay

## 2017-10-08 NOTE — Telephone Encounter (Signed)
Spoke with patient regarding AWV. Patient requesting to wait a few months to complete d/t recent falls and not feeling good. Confirmed f/u appt with PCP on Wednesday, 10/13/17.

## 2017-10-13 ENCOUNTER — Ambulatory Visit (INDEPENDENT_AMBULATORY_CARE_PROVIDER_SITE_OTHER): Payer: Medicare Other | Admitting: Family Medicine

## 2017-10-13 ENCOUNTER — Encounter: Payer: Self-pay | Admitting: Family Medicine

## 2017-10-13 VITALS — BP 108/70 | HR 65 | Temp 97.7°F | Resp 14 | Ht 63.0 in | Wt 123.0 lb

## 2017-10-13 DIAGNOSIS — I639 Cerebral infarction, unspecified: Secondary | ICD-10-CM

## 2017-10-13 DIAGNOSIS — M810 Age-related osteoporosis without current pathological fracture: Secondary | ICD-10-CM

## 2017-10-13 DIAGNOSIS — R296 Repeated falls: Secondary | ICD-10-CM | POA: Diagnosis not present

## 2017-10-13 DIAGNOSIS — I10 Essential (primary) hypertension: Secondary | ICD-10-CM | POA: Diagnosis not present

## 2017-10-13 DIAGNOSIS — Z23 Encounter for immunization: Secondary | ICD-10-CM | POA: Diagnosis not present

## 2017-10-13 DIAGNOSIS — E7800 Pure hypercholesterolemia, unspecified: Secondary | ICD-10-CM | POA: Diagnosis not present

## 2017-10-13 NOTE — Assessment & Plan Note (Signed)
Chronic problem.  Possibly over controlled given her recent falls that she can't explain.  Will decrease Amlodipine to 1/2 tab and have pt monitor BP (she has cardiology appt upcoming).  Given her hx of Afib, will not adjust her Nadolol.  Will follow.

## 2017-10-13 NOTE — Assessment & Plan Note (Signed)
Pt has had 2 broken bones resulting from falls from standing position.  Now following w/ Dr Everardo AllEllison and is on Prolia.  Will follow.

## 2017-10-13 NOTE — Assessment & Plan Note (Signed)
Pt has had 2 recent falls that have resulted in a broken wrist and a broken humerus.  The first fall pt feels she got tangled in a rug but the 2nd fall occurred when she was dizzy for an unknown reason.  Given low BP today, concern for orthostasis.  Will decrease Amlodipine to 1/2 tab daily.  Pt expressed understanding and is in agreement w/ plan.

## 2017-10-13 NOTE — Patient Instructions (Addendum)
Follow up in 6 months to recheck BP and cholesterol and schedule your Medicare Wellness Visit w/ Selena BattenKim at the same time [facility] of your lab results and make any changes if needed DECREASE the Amlodipine to 1/2 tab daily to prevent the blood pressure from dropping low and causing falls Call with any questions or concerns Happy New Year!!!

## 2017-10-13 NOTE — Assessment & Plan Note (Signed)
Chronic problem.  Tolerating statin w/o difficulty.  Check labs.  Adjust meds prn  

## 2017-10-13 NOTE — Progress Notes (Signed)
   Subjective:    Patient ID: [name], female    DOB: 15-Jan-1930, 83 y.o.   MRN: [id number]  HPI HTN- chronic problem, on Amlodipine 5 mg, Lasix 20mg  daily, Nadolol 20mg  daily w/ good control.  Denies CP, SOB, HAs, visual changes, edema.  Hyperlipidemia- chronic problem, on Lipitor 20mg  daily.  Denies abd pain, N/V.  Osteoporosis- pt had a fall from a standing position and broke her L wrist.  In a subsequent fall from standing she broke her R humerus.  Saw Dr Everardo AllEllison earlier this month.  Currently on Fosamax.  Pt is following w/ [facility]- Dr Everardo [facility])   Review of Systems For ROS see HPI     Objective:   Physical Exam  Constitutional: She is oriented to person, place, and time. She appears well-developed and well-nourished. No distress.  HENT:  Head: Normocephalic and atraumatic.  Eyes: Conjunctivae and EOM are normal. Pupils are equal, round, and reactive to light.  Neck: Normal range of motion. Neck supple. No thyromegaly present.  Cardiovascular: Normal rate, regular rhythm, normal heart sounds and intact distal pulses.  No murmur heard. Pulmonary/Chest: Effort normal and breath sounds normal. No respiratory distress.  Abdominal: Soft. She exhibits no distension. There is no tenderness.  Musculoskeletal: She exhibits no edema.  L arm in short arm cast R arm in sling  Lymphadenopathy:    She has no cervical adenopathy.  Neurological: She is alert and oriented to person, place, and time.  Skin: Skin is warm and dry.  Psychiatric: She has a normal mood and affect. Her behavior is normal.  Vitals reviewed.         Assessment & Plan:

## 2017-10-14 LAB — CBC WITH DIFFERENTIAL/PLATELET
Basophils Absolute: 59 cells/uL (ref 0–200)
Basophils Relative: 0.9 %
Eosinophils Absolute: 111 cells/uL (ref 15–500)
Eosinophils Relative: 1.7 %
HCT: 41.3 % (ref 35.0–45.0)
Hemoglobin: 14 g/dL (ref 11.7–15.5)
Lymphs Abs: 2197 cells/uL (ref 850–3900)
MCH: 32 pg (ref 27.0–33.0)
MCHC: 33.9 g/dL (ref 32.0–36.0)
MCV: 94.5 fL (ref 80.0–100.0)
MPV: 10.8 fL (ref 7.5–12.5)
Monocytes Relative: 8.9 %
Neutro Abs: 3556 cells/uL (ref 1500–7800)
Neutrophils Relative %: 54.7 %
Platelets: 249 10*3/uL (ref 140–400)
RBC: 4.37 10*6/uL (ref 3.80–5.10)
RDW: 11.7 % (ref 11.0–15.0)
Total Lymphocyte: 33.8 %
WBC mixed population: 579 cells/uL (ref 200–950)
WBC: 6.5 10*3/uL (ref 3.8–10.8)

## 2017-10-14 LAB — BASIC METABOLIC PANEL
BUN: 17 mg/dL (ref 7–25)
CO2: 27 mmol/L (ref 20–32)
Calcium: 8.7 mg/dL (ref 8.6–10.4)
Chloride: 104 mmol/L (ref 98–110)
Creat: 0.83 mg/dL (ref 0.60–0.88)
Glucose, Bld: 118 mg/dL — ABNORMAL HIGH (ref 65–99)
Potassium: 4.1 mmol/L (ref 3.5–5.3)
Sodium: 140 mmol/L (ref 135–146)

## 2017-10-14 LAB — LIPID PANEL
Cholesterol: 160 mg/dL (ref <200)
HDL: 64 mg/dL (ref >50)
LDL Cholesterol (Calc): 78 mg/dL (calc)
Non-HDL Cholesterol (Calc): 96 mg/dL (calc) (ref <130)
Total CHOL/HDL Ratio: 2.5 (calc) (ref <5.0)
Triglycerides: 95 mg/dL (ref <150)

## 2017-10-14 LAB — HEPATIC FUNCTION PANEL
AG Ratio: 2.1 (calc) (ref 1.0–2.5)
ALT: 7 U/L (ref 6–29)
AST: 16 U/L (ref 10–35)
Albumin: 4.1 g/dL (ref 3.6–5.1)
Alkaline phosphatase (APISO): 116 U/L (ref 33–130)
Bilirubin, Direct: 0.2 mg/dL (ref 0.0–0.2)
Globulin: 2 g/dL (calc) (ref 1.9–3.7)
Indirect Bilirubin: 0.6 mg/dL (calc) (ref 0.2–1.2)
Total Bilirubin: 0.8 mg/dL (ref 0.2–1.2)
Total Protein: 6.1 g/dL (ref 6.1–8.1)

## 2017-10-14 LAB — TSH: TSH: 1.87 mIU/L (ref 0.40–4.50)

## 2017-10-20 ENCOUNTER — Ambulatory Visit (INDEPENDENT_AMBULATORY_CARE_PROVIDER_SITE_OTHER): Payer: Medicare Other | Admitting: *Deleted

## 2017-10-20 DIAGNOSIS — Z5181 Encounter for therapeutic drug level monitoring: Secondary | ICD-10-CM

## 2017-10-20 LAB — POCT INR: INR: 3

## 2017-10-20 NOTE — Patient Instructions (Signed)
Description   Continue on same dosage 1 tablet daily except 1/2 tablet each Tuesday, Thursday and Saturday.  Repeat INR in 4 weeks. Call us with any pending procedure, new medication or concerns to coumadin clinic # [phone].

## 2017-10-22 DIAGNOSIS — M25532 Pain in left wrist: Secondary | ICD-10-CM | POA: Diagnosis not present

## 2017-10-22 DIAGNOSIS — M25511 Pain in right shoulder: Secondary | ICD-10-CM | POA: Diagnosis not present

## 2017-11-02 DIAGNOSIS — M810 Age-related osteoporosis without current pathological fracture: Secondary | ICD-10-CM | POA: Diagnosis not present

## 2017-11-02 DIAGNOSIS — M546 Pain in thoracic spine: Secondary | ICD-10-CM | POA: Diagnosis not present

## 2017-11-11 ENCOUNTER — Other Ambulatory Visit: Payer: Self-pay | Admitting: Family Medicine

## 2017-11-11 DIAGNOSIS — R531 Weakness: Secondary | ICD-10-CM | POA: Diagnosis not present

## 2017-11-11 DIAGNOSIS — F419 Anxiety disorder, unspecified: Secondary | ICD-10-CM

## 2017-11-11 DIAGNOSIS — M25511 Pain in right shoulder: Secondary | ICD-10-CM | POA: Diagnosis not present

## 2017-11-11 DIAGNOSIS — M25611 Stiffness of right shoulder, not elsewhere classified: Secondary | ICD-10-CM | POA: Diagnosis not present

## 2017-11-11 DIAGNOSIS — S42201D Unspecified fracture of upper end of right humerus, subsequent encounter for fracture with routine healing: Secondary | ICD-10-CM | POA: Diagnosis not present

## 2017-11-12 NOTE — Telephone Encounter (Signed)
Medication filled to pharmacy as requested.   

## 2017-11-12 NOTE — Telephone Encounter (Signed)
Last OV 10/13/17 Diazepam last filled 01/08/17 #90 with 0

## 2017-11-17 ENCOUNTER — Ambulatory Visit (INDEPENDENT_AMBULATORY_CARE_PROVIDER_SITE_OTHER): Payer: Medicare Other | Admitting: *Deleted

## 2017-11-17 DIAGNOSIS — Z5181 Encounter for therapeutic drug level monitoring: Secondary | ICD-10-CM

## 2017-11-17 DIAGNOSIS — I639 Cerebral infarction, unspecified: Secondary | ICD-10-CM

## 2017-11-17 DIAGNOSIS — S42201D Unspecified fracture of upper end of right humerus, subsequent encounter for fracture with routine healing: Secondary | ICD-10-CM | POA: Diagnosis not present

## 2017-11-17 DIAGNOSIS — R531 Weakness: Secondary | ICD-10-CM | POA: Diagnosis not present

## 2017-11-17 DIAGNOSIS — M25511 Pain in right shoulder: Secondary | ICD-10-CM | POA: Diagnosis not present

## 2017-11-17 DIAGNOSIS — M25611 Stiffness of right shoulder, not elsewhere classified: Secondary | ICD-10-CM | POA: Diagnosis not present

## 2017-11-17 LAB — POCT INR: INR: 3.9

## 2017-11-17 NOTE — Patient Instructions (Signed)
Description   Do not take any Coumadin tomorrow (already taken today's dose) and take 1/2 tablet on Friday then continue on same dosage 1 tablet daily except 1/2 tablet each Tuesday, Thursday and Saturday.  Repeat INR in 3 weeks. Call us with any pending procedure, new medication or concerns to coumadin clinic # [phone].

## 2017-11-18 DIAGNOSIS — M25532 Pain in left wrist: Secondary | ICD-10-CM | POA: Diagnosis not present

## 2017-11-18 DIAGNOSIS — M25511 Pain in right shoulder: Secondary | ICD-10-CM | POA: Diagnosis not present

## 2017-11-23 ENCOUNTER — Telehealth: Payer: Self-pay | Admitting: Family Medicine

## 2017-11-23 ENCOUNTER — Other Ambulatory Visit (INDEPENDENT_AMBULATORY_CARE_PROVIDER_SITE_OTHER): Payer: Medicare Other

## 2017-11-23 ENCOUNTER — Encounter: Payer: Self-pay | Admitting: Physician Assistant

## 2017-11-23 ENCOUNTER — Other Ambulatory Visit: Payer: Self-pay | Admitting: Physician Assistant

## 2017-11-23 DIAGNOSIS — R300 Dysuria: Secondary | ICD-10-CM | POA: Diagnosis not present

## 2017-11-23 DIAGNOSIS — N390 Urinary tract infection, site not specified: Secondary | ICD-10-CM

## 2017-11-23 LAB — POCT URINALYSIS DIPSTICK
Bilirubin, UA: NEGATIVE
Blood, UA: NEGATIVE
Glucose, UA: NEGATIVE
Ketones, UA: NEGATIVE
Nitrite, UA: NEGATIVE
Spec Grav, UA: 1.025 (ref 1.010–1.025)
Urobilinogen, UA: 0.2 E.U./dL
pH, UA: 6 (ref 5.0–8.0)

## 2017-11-23 MED ORDER — CEPHALEXIN 500 MG PO CAPS: 500.0000 mg | ORAL_CAPSULE | Freq: Two times a day (BID) | ORAL | 0 refills | Status: AC

## 2017-11-23 NOTE — Telephone Encounter (Signed)
Copied from CRM [phone]. Topic: Quick Communication - See Telephone Encounter >> Nov 23, 2017  1:44 PM Floria RavelingStovall, Shana A wrote: CRM for notification. See Telephone encounter for: pt called in and said that she feels like she has a uti.  She has crack the bone in right shoulder and it takes a while for her to get dressed.  She would like to know if husband can bring in sample without her coming in for appt   11/23/17.

## 2017-11-23 NOTE — Telephone Encounter (Signed)
Patient is aware. Stated verbal understanding.  Husband is on his way to pick up sterile container and will return it for a urine dip.  Patient is aware that no medication will be given without testing results or an appointment.   Urine supplies placed at front desk for husband to pick up.

## 2017-11-23 NOTE — Telephone Encounter (Signed)
I am ok with them bringing in a sample of urine -- he will need to come pick up a sterile container so we can get accurate results. Will not start any medications without urine testing results or appointment.

## 2017-11-23 NOTE — Telephone Encounter (Signed)
Routing to PCP as well as PA in the office during PCP's absence to see if this can be done or if it will require an appointment.

## 2017-11-23 NOTE — Progress Notes (Signed)
error 

## 2017-11-24 LAB — URINE CULTURE
MICRO NUMBER:: 90154837
SPECIMEN QUALITY:: ADEQUATE

## 2017-11-25 ENCOUNTER — Telehealth: Payer: Self-pay | Admitting: Emergency Medicine

## 2017-11-25 ENCOUNTER — Other Ambulatory Visit: Payer: Self-pay | Admitting: Physician Assistant

## 2017-11-25 MED ORDER — AMOXICILLIN-POT CLAVULANATE 500-125 MG PO TABS: 1.0000 | ORAL_TABLET | Freq: Two times a day (BID) | ORAL | 0 refills | Status: AC

## 2017-11-25 NOTE — Telephone Encounter (Signed)
Cody,   I sent the PrescriptionSelena Batten for Augmentin 500mg  to [facility]. The Patient declines coming in for a visit. She said she cannot get around good. AP, CMA

## 2017-11-25 NOTE — Telephone Encounter (Signed)
Will forward to PCP for FYI.  ? ?

## 2017-12-05 ENCOUNTER — Other Ambulatory Visit: Payer: Self-pay | Admitting: Cardiovascular Disease

## 2017-12-06 ENCOUNTER — Telehealth: Payer: Self-pay | Admitting: Family Medicine

## 2017-12-06 ENCOUNTER — Telehealth: Payer: Self-pay | Admitting: Cardiovascular Disease

## 2017-12-06 NOTE — Telephone Encounter (Signed)
Please advise, pt was last seen on 10/13/17

## 2017-12-06 NOTE — Telephone Encounter (Signed)
Spoke with patient, advised that she would need an appointment to see Dr. Beverely Lowabori before we could give her anything further for a possible UTI. Pt was adamant that she just come in and leave a sample. She did not see a reason for her to get dressed just to come in to talk to Dr. Beverely Lowabori. Pt was again asked if I could schedule her an appointment at our office, pt again refused, stated she would call her cardiologist to see if they would prescribe her an antibiotic.

## 2017-12-06 NOTE — Telephone Encounter (Signed)
This is not the first time pt has been unwilling to follow through with recommendations given by this office.  At this time, we need to consider dismissal

## 2017-12-06 NOTE — Telephone Encounter (Signed)
Refill Request.  

## 2017-12-06 NOTE — Telephone Encounter (Signed)
Patient calling , states that she would like to have an urine analysis when she goes to have INR checked. Please advise.

## 2017-12-06 NOTE — Telephone Encounter (Signed)
REFILL 

## 2017-12-06 NOTE — Telephone Encounter (Signed)
Spoke with pt, aware we do not prescribe antibiotics and she can go to urgent care or [facility]. Also [name]'s name given for patient to call for a new primary care.

## 2017-12-06 NOTE — Telephone Encounter (Signed)
Pt was told by Selena Battenody on 2/5 that if her sxs didn't improve that she needs an OV.  She needs to be seen

## 2017-12-06 NOTE — Telephone Encounter (Signed)
Copied from [place] [id number]. Topic: Quick Communication - See Telephone Encounter >> Dec 06, 2017 10:14 AM Cleaster Corin, NT wrote: CRM for notification. See Telephone encounter for:   12/06/17. Pt. Calling to see if she can have her husband come in to pick up kit to give another urine sample or can she have another rx. For antibiotic for uti. She is still having the same symptoms. Pt. Can be reached at [facility] [address] [place] [address] Phone: [phone] Fax: [phone]

## 2017-12-08 ENCOUNTER — Ambulatory Visit (INDEPENDENT_AMBULATORY_CARE_PROVIDER_SITE_OTHER): Payer: Medicare Other | Admitting: Pharmacist

## 2017-12-08 DIAGNOSIS — Z5181 Encounter for therapeutic drug level monitoring: Secondary | ICD-10-CM

## 2017-12-08 LAB — POCT INR: INR: 2.3

## 2017-12-08 NOTE — Patient Instructions (Signed)
Description   Continue on same dosage 1 tablet daily except 1/2 tablet each Tuesday, Thursday and Saturday.  Repeat INR in 4 weeks. Call us with any pending procedure, new medication or concerns to coumadin clinic # [phone].      

## 2017-12-20 DIAGNOSIS — M25611 Stiffness of right shoulder, not elsewhere classified: Secondary | ICD-10-CM | POA: Diagnosis not present

## 2017-12-20 DIAGNOSIS — S42201D Unspecified fracture of upper end of right humerus, subsequent encounter for fracture with routine healing: Secondary | ICD-10-CM | POA: Diagnosis not present

## 2017-12-20 DIAGNOSIS — M25511 Pain in right shoulder: Secondary | ICD-10-CM | POA: Diagnosis not present

## 2017-12-20 DIAGNOSIS — R531 Weakness: Secondary | ICD-10-CM | POA: Diagnosis not present

## 2017-12-22 DIAGNOSIS — S42201D Unspecified fracture of upper end of right humerus, subsequent encounter for fracture with routine healing: Secondary | ICD-10-CM | POA: Diagnosis not present

## 2017-12-22 DIAGNOSIS — M25511 Pain in right shoulder: Secondary | ICD-10-CM | POA: Diagnosis not present

## 2017-12-22 DIAGNOSIS — M25611 Stiffness of right shoulder, not elsewhere classified: Secondary | ICD-10-CM | POA: Diagnosis not present

## 2017-12-24 ENCOUNTER — Ambulatory Visit
Admission: RE | Admit: 2017-12-24 | Discharge: 2017-12-24 | Disposition: A | Discharge status: Home / Self Care | Payer: Medicare Other | Source: Ambulatory Visit | Attending: Anesthesiology | Admitting: Anesthesiology

## 2017-12-24 DIAGNOSIS — M546 Pain in thoracic spine: Principal | ICD-10-CM

## 2017-12-24 DIAGNOSIS — G8929 Other chronic pain: Secondary | ICD-10-CM

## 2017-12-24 DIAGNOSIS — S22070A Wedge compression fracture of T9-T10 vertebra, initial encounter for closed fracture: Secondary | ICD-10-CM | POA: Diagnosis not present

## 2017-12-28 DIAGNOSIS — S42201D Unspecified fracture of upper end of right humerus, subsequent encounter for fracture with routine healing: Secondary | ICD-10-CM | POA: Diagnosis not present

## 2017-12-28 DIAGNOSIS — M25611 Stiffness of right shoulder, not elsewhere classified: Secondary | ICD-10-CM | POA: Diagnosis not present

## 2017-12-28 DIAGNOSIS — M25511 Pain in right shoulder: Secondary | ICD-10-CM | POA: Diagnosis not present

## 2017-12-30 DIAGNOSIS — M25511 Pain in right shoulder: Secondary | ICD-10-CM | POA: Diagnosis not present

## 2017-12-30 DIAGNOSIS — M25532 Pain in left wrist: Secondary | ICD-10-CM | POA: Diagnosis not present

## 2018-01-04 ENCOUNTER — Ambulatory Visit: Payer: Medicare Other | Admitting: Endocrinology

## 2018-01-04 ENCOUNTER — Ambulatory Visit: Payer: Medicare Other

## 2018-01-04 DIAGNOSIS — M546 Pain in thoracic spine: Secondary | ICD-10-CM | POA: Diagnosis not present

## 2018-01-05 ENCOUNTER — Ambulatory Visit (INDEPENDENT_AMBULATORY_CARE_PROVIDER_SITE_OTHER): Payer: Medicare Other | Admitting: *Deleted

## 2018-01-05 DIAGNOSIS — Z8673 Personal history of transient ischemic attack (TIA), and cerebral infarction without residual deficits: Secondary | ICD-10-CM | POA: Diagnosis not present

## 2018-01-05 DIAGNOSIS — I481 Persistent atrial fibrillation: Secondary | ICD-10-CM | POA: Diagnosis not present

## 2018-01-05 DIAGNOSIS — Z5181 Encounter for therapeutic drug level monitoring: Secondary | ICD-10-CM

## 2018-01-05 DIAGNOSIS — I4819 Other persistent atrial fibrillation: Secondary | ICD-10-CM

## 2018-01-05 LAB — POCT INR: INR: 3.2

## 2018-01-05 NOTE — Patient Instructions (Signed)
Description   Do not take coumadin tomorrow March 21st as she has taken today then continue on same dosage 1 tablet daily except 1/2 tablet each Tuesday, Thursday and Saturday.  Repeat INR in 2 weeks. Call us with any pending procedure, new medication or concerns to coumadin clinic # [phone].

## 2018-01-06 DIAGNOSIS — M25611 Stiffness of right shoulder, not elsewhere classified: Secondary | ICD-10-CM | POA: Diagnosis not present

## 2018-01-06 DIAGNOSIS — M25511 Pain in right shoulder: Secondary | ICD-10-CM | POA: Diagnosis not present

## 2018-01-06 DIAGNOSIS — S42201D Unspecified fracture of upper end of right humerus, subsequent encounter for fracture with routine healing: Secondary | ICD-10-CM | POA: Diagnosis not present

## 2018-01-11 DIAGNOSIS — S42201D Unspecified fracture of upper end of right humerus, subsequent encounter for fracture with routine healing: Secondary | ICD-10-CM | POA: Diagnosis not present

## 2018-01-11 DIAGNOSIS — M25611 Stiffness of right shoulder, not elsewhere classified: Secondary | ICD-10-CM | POA: Diagnosis not present

## 2018-01-11 DIAGNOSIS — M25511 Pain in right shoulder: Secondary | ICD-10-CM | POA: Diagnosis not present

## 2018-01-13 DIAGNOSIS — M25511 Pain in right shoulder: Secondary | ICD-10-CM | POA: Diagnosis not present

## 2018-01-13 DIAGNOSIS — M25611 Stiffness of right shoulder, not elsewhere classified: Secondary | ICD-10-CM | POA: Diagnosis not present

## 2018-01-13 DIAGNOSIS — S42201D Unspecified fracture of upper end of right humerus, subsequent encounter for fracture with routine healing: Secondary | ICD-10-CM | POA: Diagnosis not present

## 2018-01-18 DIAGNOSIS — M546 Pain in thoracic spine: Secondary | ICD-10-CM | POA: Diagnosis not present

## 2018-01-19 ENCOUNTER — Ambulatory Visit (INDEPENDENT_AMBULATORY_CARE_PROVIDER_SITE_OTHER): Payer: Medicare Other | Admitting: *Deleted

## 2018-01-19 ENCOUNTER — Telehealth: Payer: Self-pay | Admitting: Cardiovascular Disease

## 2018-01-19 DIAGNOSIS — Z8673 Personal history of transient ischemic attack (TIA), and cerebral infarction without residual deficits: Secondary | ICD-10-CM

## 2018-01-19 DIAGNOSIS — I481 Persistent atrial fibrillation: Secondary | ICD-10-CM

## 2018-01-19 DIAGNOSIS — Z5181 Encounter for therapeutic drug level monitoring: Secondary | ICD-10-CM | POA: Diagnosis not present

## 2018-01-19 DIAGNOSIS — I4819 Other persistent atrial fibrillation: Secondary | ICD-10-CM

## 2018-01-19 LAB — POCT INR: INR: 2.4

## 2018-01-19 NOTE — Telephone Encounter (Signed)
New Message   Patient is wanting a call back from a nurse. She wants a nurse to give her a recommendation of a PCP that they trust. Please call to discuss.

## 2018-01-19 NOTE — Patient Instructions (Signed)
Description   Continue on same dosage 1 tablet daily except 1/2 tablet each Tuesday, Thursday and Saturday.  Repeat INR in 3weeks. Call us with any pending procedure, new medication or concerns to coumadin clinic # [phone].

## 2018-01-19 NOTE — Telephone Encounter (Signed)
Left a message to cal back

## 2018-01-24 DIAGNOSIS — M25511 Pain in right shoulder: Secondary | ICD-10-CM | POA: Diagnosis not present

## 2018-01-24 DIAGNOSIS — S42201D Unspecified fracture of upper end of right humerus, subsequent encounter for fracture with routine healing: Secondary | ICD-10-CM | POA: Diagnosis not present

## 2018-01-24 DIAGNOSIS — M25611 Stiffness of right shoulder, not elsewhere classified: Secondary | ICD-10-CM | POA: Diagnosis not present

## 2018-01-24 DIAGNOSIS — M546 Pain in thoracic spine: Secondary | ICD-10-CM | POA: Diagnosis not present

## 2018-01-25 DIAGNOSIS — M25511 Pain in right shoulder: Secondary | ICD-10-CM | POA: Diagnosis not present

## 2018-01-25 DIAGNOSIS — M546 Pain in thoracic spine: Secondary | ICD-10-CM | POA: Diagnosis not present

## 2018-01-25 DIAGNOSIS — S42201D Unspecified fracture of upper end of right humerus, subsequent encounter for fracture with routine healing: Secondary | ICD-10-CM | POA: Diagnosis not present

## 2018-01-25 DIAGNOSIS — M25611 Stiffness of right shoulder, not elsewhere classified: Secondary | ICD-10-CM | POA: Diagnosis not present

## 2018-01-27 DIAGNOSIS — M546 Pain in thoracic spine: Secondary | ICD-10-CM | POA: Diagnosis not present

## 2018-01-31 DIAGNOSIS — M546 Pain in thoracic spine: Secondary | ICD-10-CM | POA: Diagnosis not present

## 2018-01-31 NOTE — Telephone Encounter (Signed)
Left message to call back  

## 2018-02-01 DIAGNOSIS — M25611 Stiffness of right shoulder, not elsewhere classified: Secondary | ICD-10-CM | POA: Diagnosis not present

## 2018-02-01 DIAGNOSIS — S42201D Unspecified fracture of upper end of right humerus, subsequent encounter for fracture with routine healing: Secondary | ICD-10-CM | POA: Diagnosis not present

## 2018-02-01 DIAGNOSIS — M25511 Pain in right shoulder: Secondary | ICD-10-CM | POA: Diagnosis not present

## 2018-02-02 ENCOUNTER — Encounter: Payer: Self-pay | Admitting: Cardiovascular Disease

## 2018-02-02 NOTE — Telephone Encounter (Signed)
This encounter was created in error - please disregard.

## 2018-02-02 NOTE — Telephone Encounter (Signed)
New message ° °Pt verbalized that she is returning call for RN °

## 2018-02-02 NOTE — Telephone Encounter (Signed)
Spoke with patient and she is wanting to transfer PCP. She is going to call [facility] who is closer. She will call their office to see about an appointment   Lincoln MaxinSumner, Kedra K at 02/02/2018 2:41 PM   Status: Signed    New message  Pt verbalized that she is returning call for RN

## 2018-02-03 DIAGNOSIS — M546 Pain in thoracic spine: Secondary | ICD-10-CM | POA: Diagnosis not present

## 2018-02-07 DIAGNOSIS — M546 Pain in thoracic spine: Secondary | ICD-10-CM | POA: Diagnosis not present

## 2018-02-09 ENCOUNTER — Ambulatory Visit (INDEPENDENT_AMBULATORY_CARE_PROVIDER_SITE_OTHER): Payer: Medicare Other | Admitting: *Deleted

## 2018-02-09 DIAGNOSIS — Z5181 Encounter for therapeutic drug level monitoring: Secondary | ICD-10-CM

## 2018-02-09 DIAGNOSIS — S42201D Unspecified fracture of upper end of right humerus, subsequent encounter for fracture with routine healing: Secondary | ICD-10-CM | POA: Diagnosis not present

## 2018-02-09 DIAGNOSIS — I639 Cerebral infarction, unspecified: Secondary | ICD-10-CM | POA: Diagnosis not present

## 2018-02-09 DIAGNOSIS — M25511 Pain in right shoulder: Secondary | ICD-10-CM | POA: Diagnosis not present

## 2018-02-09 DIAGNOSIS — M25611 Stiffness of right shoulder, not elsewhere classified: Secondary | ICD-10-CM | POA: Diagnosis not present

## 2018-02-09 LAB — POCT INR: INR: 4.2

## 2018-02-09 NOTE — Patient Instructions (Addendum)
Description   Since you have already taken today's dose, do not take any Coumadin tomorrow and take 1/2 tablet on Friday then continue on same dosage 1 tablet daily except 1/2 tablet each Tuesday, Thursday, and Saturday.  Repeat INR in 2 weeks. Call us with any pending procedure, new medication or concerns to coumadin clinic # [phone].

## 2018-02-13 NOTE — Progress Notes (Signed)
Cardiology Office Note   Date:  02/14/2018   ID:  [name], DOB 01-Feb-1930, MRN [id number]  PCP:  Sheliah Hatch, MD  Cardiologist:   Chilton Si, MD   No chief complaint on file.   History of Present Illness: [name] is a 84 y.o. female with chronic diastolic heart failure, persistent atrial fibrillation, hyperlipidemia and hypothyroidism who presents for follow up.  She was previously a patient of Dr. Patty Sermons.  She had a [employer] 08/2015. She was noted to be in atrial fibrillation, but there is no evidence of ischemia.  Echocardiogram 01/2012 revealed LVEF 50-55%.  [name] had a thoracic  vertebral compression fracture and had to undergo kyphoplasty.   Warfarin was held and she was bridges with Lovenox.  This was complicated by a paraspinal hematoma.  She was last seen 12/2016 and was doing well but still struggled with back pain.  She suffered from another vertebral fracture and struggles with pain in her back.  She is not a candidate for any further interventions.  She has been working with pain management and takes oxycodone at night to sleep.  This is been very helpful for her.  Since her last appointment she is also broken her left wrist and was recently able to stop wearing her cast.  She additionally broke her right shoulder and just finished physical therapy.  She has been feeling generally well but sometimes feels unsteady on her feet.  She denies any chest pain or shortness of breath.  She has not noted any lower extremity edema, orthopnea, or PND.  She is not getting much exercise.  She feels unsteady on her feet.   Past Medical History:  Diagnosis Date  . Atrial fibrillation (HCC)   . Chronic anticoagulation   . Chronic anticoagulation   . CVA (cerebrovascular accident) (HCC)   . Diabetes mellitus   . History of chicken pox   . Hyperlipidemia   . Hypertension     Past Surgical History:  Procedure Laterality  Date  . APPENDECTOMY    . BACK SURGERY    . CARDIAC CATHETERIZATION  11/09/91   EF 70%  . DILATION AND CURETTAGE OF UTERUS       Current Outpatient Medications  Medication Sig Dispense Refill  . alendronate (FOSAMAX) 70 MG tablet TAKE ONE TABLET BY MOUTH EVERY 7 DAYS WITH A FULL GLASS OF WATER ON AN EMPTY STOMACH 4 tablet 11  . amLODipine (NORVASC) 2.5 MG tablet Take 1 tablet (2.5 mg total) by mouth daily. 90 tablet 1  . atorvastatin (LIPITOR) 20 MG tablet Take 0.5 tablets (10 mg total) by mouth every morning. 30 tablet 6  . diazepam (VALIUM) 2 MG tablet TAKE 1 TABLET BY MOUTH TWICE DAILY AS NEEDED FOR ANXIETY 90 tablet 0  . furosemide (LASIX) 20 MG tablet Take 10 mg by mouth as needed for fluid or edema.    Marland Kitchen levothyroxine (SYNTHROID, LEVOTHROID) 25 MCG tablet TAKE 1 TABLET BY MOUTH ONCE DAILY BEFORE BREAKFAST 90 tablet 0  . nadolol (CORGARD) 20 MG tablet Take 0.5 tablets (10 mg total) by mouth daily. KEEP OV. 45 tablet 0  . nitroGLYCERIN (NITROSTAT) 0.4 MG SL tablet Place 0.4 mg under the tongue every 5 (five) minutes as needed (chest pain).     Marland Kitchen oxyCODONE-acetaminophen (PERCOCET/ROXICET) 5-325 MG tablet Take 1 tablet by mouth every 4 (four) hours as needed for severe pain.     . potassium chloride (K-DUR) 10  MEQ tablet TAKE ONE TABLET BY MOUTH ONCE DAILY 90 tablet 3  . pregabalin (LYRICA) 50 MG capsule Take 50 mg by mouth 2 (two) times daily.    Marland Kitchen warfarin (COUMADIN) 5 MG tablet TAKE TABLET BY MOUTH AS DIRECTED BY COUMADIN CLINIC 90 tablet 1   No current facility-administered medications for this visit.     Allergies:   Cozaar; Hydralazine; Hyzaar [losartan potassium-hctz]; Sulfa drugs cross reactors; and Lisinopril    Social History:  The patient  reports that she quit smoking about 19 years ago. She has never used smokeless tobacco. She reports that she does not drink alcohol or use drugs.   Family History:  The patient's family history includes Heart disease in her father and  mother; Heart failure in her brother; Hypertension in her mother; Stroke in her mother.    ROS:  Please see the history of present illness.   Otherwise, review of systems are positive for none.   All other systems are reviewed and negative.    PHYSICAL EXAM: VS:  BP 108/71   Pulse 62   Ht  (1.575 m)   Wt 124 lb 12.8 oz (56.6 kg)   BMI 22.83 kg/m  , BMI Body mass index is 22.83 kg/m. GENERAL:  Well appearing.  No acute distress.   HEENT: Pupils equal round and reactive, fundi not visualized, oral mucosa unremarkable NECK:  No jugular venous distention, waveform within normal limits, carotid upstroke brisk and symmetric, no bruits, no thyromegaly LYMPHATICS:  No cervical adenopathy LUNGS:  Clear to auscultation bilaterally HEART: Irregularly irregular. PMI not displaced or sustained,S1 and S2 within normal limits, no S3, no S4, no clicks, no rubs, no murmurs ABD:  Flat, positive bowel sounds normal in frequency in pitch, no bruits, no rebound, no guarding, no midline pulsatile mass, no hepatomegaly, no splenomegaly EXT:  2 plus pulses throughout, no edema, no cyanosis no clubbing SKIN:  No rashes no nodules NEURO:  Cranial nerves II through XII grossly intact, motor grossly intact throughout PSYCH:  Cognitively intact, oriented to person place and time   EKG:  EKG is ordered today. The ekg ordered today demonstrates atrial fibrillation rate 86 bpm. 02/14/18: Atrial flutter.  Rate 62 bpm.   4:1 AV conduction.  LAFB.     Recent Labs: 10/13/2017: ALT 7; BUN 17; Creat 0.83; Hemoglobin 14.0; Platelets 249; Potassium 4.1; Sodium 140; TSH 1.87    Lipid Panel    Component Value Date/Time   CHOL 160 10/13/2017 1658   TRIG 95 10/13/2017 1658   HDL 64 10/13/2017 1658   CHOLHDL 2.5 10/13/2017 1658   VLDL 19.4 03/31/2017 1517   LDLCALC 78 10/13/2017 1658      Wt Readings from Last 3 Encounters:  02/14/18 124 lb 12.8 oz (56.6 kg)  10/13/17 123 lb (55.8 kg)  09/24/17 126 lb  (57.2 kg)      ASSESSMENT AND PLAN:  # Persistent atrial fibrillation: [name] is in atrial flutter.  She is completely asymptomatic and her rate is well controlled.  # Hypertension:  Blood pressure is well-controlled on amlodipine and nadolol.  However her BP ahs been low and she is unsteady on her feet.  We will reduce amlodipine to 2.5 mg daily.  She will check her blood pressures and bring to follow-up.  Continue nadalol.   # Hyperlipidemia: LDL 78 on 09/2017.  Continue atorvastatin.    Current medicines are reviewed at length with the patient today.  The patient does not have  concerns regarding medicines.  The following changes have been made:  no change  Labs/ tests ordered today include:  No orders of the defined types were placed in this encounter.    Disposition:   FU with Nira Visscher C. Duke Salvia, MD, [facility] in 3 months.   Time spent: 30 minutes-Greater than 50% of this time was spent in counseling, explanation of diagnosis, planning of further management, and coordination of care.   Signed, Karsyn Jamie C. Duke Salvia, MD, [facility]  02/14/2018 4:18 PM    [facility]

## 2018-02-14 ENCOUNTER — Ambulatory Visit (INDEPENDENT_AMBULATORY_CARE_PROVIDER_SITE_OTHER): Payer: Medicare Other | Admitting: Cardiovascular Disease

## 2018-02-14 ENCOUNTER — Encounter: Payer: Self-pay | Admitting: Cardiovascular Disease

## 2018-02-14 VITALS — BP 108/71 | HR 62 | Ht 62.0 in | Wt 124.8 lb

## 2018-02-14 DIAGNOSIS — Z7901 Long term (current) use of anticoagulants: Secondary | ICD-10-CM | POA: Diagnosis not present

## 2018-02-14 DIAGNOSIS — I482 Chronic atrial fibrillation, unspecified: Secondary | ICD-10-CM

## 2018-02-14 DIAGNOSIS — I639 Cerebral infarction, unspecified: Secondary | ICD-10-CM

## 2018-02-14 DIAGNOSIS — E7800 Pure hypercholesterolemia, unspecified: Secondary | ICD-10-CM

## 2018-02-14 DIAGNOSIS — I10 Essential (primary) hypertension: Secondary | ICD-10-CM

## 2018-02-14 MED ORDER — AMLODIPINE BESYLATE 2.5 MG PO TABS: 2.5000 mg | ORAL_TABLET | Freq: Every day | ORAL | 1 refills | Status: DC

## 2018-02-14 NOTE — Addendum Note (Signed)
Addended by: Regis Bill B on: 02/14/2018 04:57 PM   Modules accepted: Orders

## 2018-02-14 NOTE — Patient Instructions (Signed)
Medication Instructions:  DECREASE YOUR AMLODIPINE TO 2.5 MG DAILY   Labwork: NONE  Testing/Procedures: NONE  Follow-Up: Your physician recommends that you schedule a follow-up appointment in: 3 MONTH OV  Any Other Special Instructions Will Be Listed Below (If Applicable).  MONITOR AND LOG YOUR BLOOD PRESSURE AT HOME. BRING READINGS WITH YOU TO YOUR FOLLOW UP   If you need a refill on your cardiac medications before your next appointment, please call your pharmacy.

## 2018-02-15 DIAGNOSIS — M546 Pain in thoracic spine: Secondary | ICD-10-CM | POA: Diagnosis not present

## 2018-02-17 DIAGNOSIS — M546 Pain in thoracic spine: Secondary | ICD-10-CM | POA: Diagnosis not present

## 2018-02-22 ENCOUNTER — Ambulatory Visit (INDEPENDENT_AMBULATORY_CARE_PROVIDER_SITE_OTHER): Payer: Medicare Other | Admitting: *Deleted

## 2018-02-22 DIAGNOSIS — Z5181 Encounter for therapeutic drug level monitoring: Secondary | ICD-10-CM

## 2018-02-22 DIAGNOSIS — M546 Pain in thoracic spine: Secondary | ICD-10-CM | POA: Diagnosis not present

## 2018-02-22 LAB — POCT INR: INR: 3.6

## 2018-02-22 NOTE — Patient Instructions (Signed)
Description   Skip tomorrow's dose, then change your dose to 1/2 tablet daily except 1 tablet on Mondays, Wednesdays and Fridays.  Repeat INR in 2 weeks. Call us with any pending procedure, new medication or concerns to coumadin clinic # [phone].

## 2018-02-24 DIAGNOSIS — M546 Pain in thoracic spine: Secondary | ICD-10-CM | POA: Diagnosis not present

## 2018-02-26 ENCOUNTER — Other Ambulatory Visit: Payer: Self-pay | Admitting: Family Medicine

## 2018-02-28 DIAGNOSIS — M546 Pain in thoracic spine: Secondary | ICD-10-CM | POA: Diagnosis not present

## 2018-03-03 DIAGNOSIS — M546 Pain in thoracic spine: Secondary | ICD-10-CM | POA: Diagnosis not present

## 2018-03-07 ENCOUNTER — Other Ambulatory Visit: Payer: Self-pay | Admitting: Cardiovascular Disease

## 2018-03-07 ENCOUNTER — Other Ambulatory Visit: Payer: Self-pay | Admitting: Family Medicine

## 2018-03-07 DIAGNOSIS — F419 Anxiety disorder, unspecified: Secondary | ICD-10-CM

## 2018-03-07 NOTE — Telephone Encounter (Signed)
Last OV 10/13/17 Diazepam last filled 11/12/17 #90 with 0

## 2018-03-08 ENCOUNTER — Ambulatory Visit (INDEPENDENT_AMBULATORY_CARE_PROVIDER_SITE_OTHER): Payer: Medicare Other | Admitting: *Deleted

## 2018-03-08 DIAGNOSIS — Z5181 Encounter for therapeutic drug level monitoring: Secondary | ICD-10-CM | POA: Diagnosis not present

## 2018-03-08 DIAGNOSIS — I639 Cerebral infarction, unspecified: Secondary | ICD-10-CM | POA: Diagnosis not present

## 2018-03-08 LAB — POCT INR: INR: 2.9 (ref 2.0–3.0)

## 2018-03-08 NOTE — Patient Instructions (Signed)
Description   Continue taking 1/2 tablet daily except 1 tablet on Mondays, Wednesdays and Fridays.  Repeat INR in 3 weeks. Call us with any pending procedure, new medication or concerns to coumadin clinic # [phone].

## 2018-03-08 NOTE — Telephone Encounter (Signed)
Ok for #90, no refills.  Is overdue for 6 month appt

## 2018-03-09 NOTE — Telephone Encounter (Signed)
Called pt and she advised that it is not a good time to schedule an appointment. She will call back after therapy to schedule.

## 2018-03-29 ENCOUNTER — Ambulatory Visit: Payer: Medicare Other | Admitting: Endocrinology

## 2018-03-30 ENCOUNTER — Ambulatory Visit (INDEPENDENT_AMBULATORY_CARE_PROVIDER_SITE_OTHER): Payer: Medicare Other | Admitting: Pharmacist

## 2018-03-30 DIAGNOSIS — Z5181 Encounter for therapeutic drug level monitoring: Secondary | ICD-10-CM | POA: Diagnosis not present

## 2018-03-30 LAB — POCT INR: INR: 1.9 — AB (ref 2.0–3.0)

## 2018-03-30 NOTE — Patient Instructions (Signed)
Description   Take an extra 1/2 tablet when you get home (since took 1 tablet already today) then continue taking 1/2 tablet daily except 1 tablet on Mondays, Wednesdays and Fridays.  Repeat INR in 3 weeks. Call us with any pending procedure, new medication or concerns to coumadin clinic # [phone].

## 2018-03-31 ENCOUNTER — Telehealth: Payer: Self-pay

## 2018-03-31 NOTE — Telephone Encounter (Signed)
LMTCB to get scheduled for nurse visit for Prolia injection- patient is ready to be scheduled and owes $0

## 2018-04-01 NOTE — Telephone Encounter (Signed)
Pt is coming in for an appt on 04/12/18 to see Everardo Allllison and has asked to have the prolia done that day could that work

## 2018-04-06 DIAGNOSIS — G8929 Other chronic pain: Secondary | ICD-10-CM | POA: Diagnosis not present

## 2018-04-06 DIAGNOSIS — M810 Age-related osteoporosis without current pathological fracture: Secondary | ICD-10-CM | POA: Diagnosis not present

## 2018-04-06 NOTE — Telephone Encounter (Signed)
That is fine I have already discussed this with the patient and she will owe $0 and will get it in OV on 04/12/18

## 2018-04-12 ENCOUNTER — Ambulatory Visit (INDEPENDENT_AMBULATORY_CARE_PROVIDER_SITE_OTHER): Payer: Medicare Other | Admitting: Endocrinology

## 2018-04-12 ENCOUNTER — Encounter: Payer: Self-pay | Admitting: Endocrinology

## 2018-04-12 VITALS — BP 108/68 | HR 61 | Ht 62.0 in | Wt 122.6 lb

## 2018-04-12 DIAGNOSIS — I639 Cerebral infarction, unspecified: Secondary | ICD-10-CM | POA: Diagnosis not present

## 2018-04-12 DIAGNOSIS — M810 Age-related osteoporosis without current pathological fracture: Secondary | ICD-10-CM

## 2018-04-12 LAB — VITAMIN D 25 HYDROXY (VIT D DEFICIENCY, FRACTURES): VITD: 83.82 ng/mL (ref 30.00–100.00)

## 2018-04-12 MED ORDER — DENOSUMAB 60 MG/ML ~~LOC~~ SOSY
60.0000 mg | PREFILLED_SYRINGE | Freq: Once | SUBCUTANEOUS | Status: AC
  Administered 2018-04-12: 60 mg via SUBCUTANEOUS

## 2018-04-12 NOTE — Patient Instructions (Addendum)
Please continue the same fosamax and prolia for now.  Let's recheck the bone density.   blood tests are requested for you today.  We'll let you know about the results.   It is critically important to prevent falling down (keep floor areas well-lit, dry, and free of loose objects.  If you have a cane, walker, or wheelchair, you should use it, even for short trips around the house.  Wear flat-soled shoes.  Also, try not to rush).   Take calcium 1000 mg per day, and vitamin-D, 2000 units per day.    Please come back for a follow-up appointment in 6 months.

## 2018-04-12 NOTE — Progress Notes (Signed)
Subjective:    Patient ID: [name], female    DOB: Feb 08, 1930, 84 y.o.   MRN: [id number]  HPI Pt returns for f/u of osteoporosis: Dx'ed: 1997 Secondary cause: vid-D def has been rx'ed.  She has high PTH and PMH of smoking Fractures: left hip, left wrist, right shoulder (all with falls), and T-11 compression.   Past rx: none Current rx: fosamax (intermitt since 1997), and Prolia since 2018.   Last DEXA result: (2017): worst T-score was -4.1 Other: she is not a candidate for Forteo, due to high PTH level Interval hx: she tolerates fosamax well.  Main symptom is back pain.  Past Medical History:  Diagnosis Date  . Atrial fibrillation (HCC)   . Chronic anticoagulation   . Chronic anticoagulation   . CVA (cerebrovascular accident) (HCC)   . Diabetes mellitus   . History of chicken pox   . Hyperlipidemia   . Hypertension     Past Surgical History:  Procedure Laterality Date  . APPENDECTOMY    . BACK SURGERY    . CARDIAC CATHETERIZATION  11/09/91   EF 70%  . DILATION AND CURETTAGE OF UTERUS      Social History   Socioeconomic History  . Marital status: Married    Spouse name: Not on file  . Number of children: Not on file  . Years of education: Not on file  . Highest education level: Not on file  Occupational History  . Not on file  Social Needs  . Financial resource strain: Not on file  . Food insecurity:    Worry: Not on file    Inability: Not on file  . Transportation needs:    Medical: Not on file    Non-medical: Not on file  Tobacco Use  . Smoking status: Former Smoker    Last attempt to quit: 10/19/1998    Years since quitting: 19.5  . Smokeless tobacco: Never Used  Substance and Sexual Activity  . Alcohol use: No  . Drug use: No  . Sexual activity: Not on file  Lifestyle  . Physical activity:    Days per week: Not on file    Minutes per session: Not on file  . Stress: Not on file  Relationships  . Social connections:    Talks on  phone: Not on file    Gets together: Not on file    Attends religious service: Not on file    Active member of club or organization: Not on file    Attends meetings of clubs or organizations: Not on file    Relationship status: Not on file  . Intimate partner violence:    Fear of current or ex partner: Not on file    Emotionally abused: Not on file    Physically abused: Not on file    Forced sexual activity: Not on file  Other Topics Concern  . Not on file  Social History Narrative  . Not on file    Current Outpatient Medications on File Prior to Visit  Medication Sig Dispense Refill  . alendronate (FOSAMAX) 70 MG tablet TAKE ONE TABLET BY MOUTH EVERY 7 DAYS WITH A FULL GLASS OF WATER ON AN EMPTY STOMACH 4 tablet 11  . amLODipine (NORVASC) 2.5 MG tablet Take 1 tablet (2.5 mg total) by mouth daily. 90 tablet 1  . atorvastatin (LIPITOR) 20 MG tablet Take 0.5 tablets (10 mg total) by mouth every morning. 30 tablet 6  . diazepam (VALIUM) 2 MG tablet  TAKE 1 TABLET BY MOUTH TWICE DAILY AS NEEDED FOR ANXIETY 90 tablet 0  . furosemide (LASIX) 20 MG tablet Take 10 mg by mouth as needed for fluid or edema.    Marland Kitchen levothyroxine (SYNTHROID, LEVOTHROID) 25 MCG tablet TAKE 1 TABLET BY MOUTH ONCE DAILY BEFORE  BREAKFAST 90 tablet 0  . nadolol (CORGARD) 20 MG tablet TAKE 1/2 (ONE-HALF) TABLET BY MOUTH ONCE DAILY 45 tablet 0  . nitroGLYCERIN (NITROSTAT) 0.4 MG SL tablet Place 0.4 mg under the tongue every 5 (five) minutes as needed (chest pain).     Marland Kitchen oxyCODONE-acetaminophen (PERCOCET/ROXICET) 5-325 MG tablet Take 1 tablet by mouth every 4 (four) hours as needed for severe pain.     . potassium chloride (K-DUR) 10 MEQ tablet TAKE ONE TABLET BY MOUTH ONCE DAILY 90 tablet 3  . pregabalin (LYRICA) 50 MG capsule Take 50 mg by mouth 2 (two) times daily.    Marland Kitchen warfarin (COUMADIN) 5 MG tablet Take 1/2 to 1 tablet daily as directed by coumadin clinic 90 tablet 0   No current facility-administered medications on  file prior to visit.     Allergies  Allergen Reactions  . Cozaar Other (See Comments)    "Almost passed out"  . Hydralazine Other (See Comments)    "almost passed out"  . Hyzaar [Losartan Potassium-Hctz] Other (See Comments)    "almost passed out"  . Sulfa Drugs Cross Reactors Other (See Comments)    "turned me blue"   . Lisinopril     Unknown reaction, per pt    Family History  Problem Relation Age of Onset  . Heart disease Mother   . Hypertension Mother   . Stroke Mother   . Heart disease Father   . Heart failure Brother   . Osteoporosis Neg Hx     BP 108/68 (BP Location: Left Arm, Patient Position: Sitting, Cuff Size: Normal)   Pulse 61   Ht 5\' 2"  (1.575 m)   Wt 122 lb 9.6 oz (55.6 kg)   SpO2 99%   BMI 22.42 kg/m   Review of Systems Denies heartburn    Objective:   Physical Exam VITAL SIGNS:  See vs page.   GENERAL: no distress.  Chest wall: no kyphosis.   Gait: normal and steady.     Lab Results  Component Value Date   CREATININE 0.83 10/13/2017   BUN 17 10/13/2017   NA 140 10/13/2017   K 4.1 10/13/2017   CL 104 10/13/2017   CO2 27 10/13/2017       Assessment & Plan:  Osteoporosis: due for recheck Vit-D def: recheck tody  Patient Instructions  Please continue the same fosamax and prolia for now.  Let's recheck the bone density.   blood tests are requested for you today.  We'll let you know about the results.   It is critically important to prevent falling down (keep floor areas well-lit, dry, and free of loose objects.  If you have a cane, walker, or wheelchair, you should use it, even for short trips around the house.  Wear flat-soled shoes.  Also, try not to rush).   Take calcium 1000 mg per day, and vitamin-D, 2000 units per day.    Please come back for a follow-up appointment in 6 months.

## 2018-04-13 LAB — PTH, INTACT AND CALCIUM
Calcium: 9.8 mg/dL (ref 8.6–10.4)
PTH: 131 pg/mL — ABNORMAL HIGH (ref 14–64)

## 2018-04-20 ENCOUNTER — Ambulatory Visit (INDEPENDENT_AMBULATORY_CARE_PROVIDER_SITE_OTHER): Payer: Medicare Other | Admitting: Pharmacist

## 2018-04-20 DIAGNOSIS — Z5181 Encounter for therapeutic drug level monitoring: Secondary | ICD-10-CM

## 2018-04-20 LAB — POCT INR: INR: 3.4 — AB (ref 2.0–3.0)

## 2018-04-20 NOTE — Patient Instructions (Signed)
Description   Skip your Coumadin tomorrow, then continue taking 1/2 tablet daily except 1 tablet on Mondays, Wednesdays and Fridays.  Repeat INR in 3 weeks. Call us with any pending procedure, new medication or concerns to coumadin clinic # [phone].

## 2018-05-02 ENCOUNTER — Other Ambulatory Visit: Payer: Self-pay | Admitting: Nurse Practitioner

## 2018-05-03 DIAGNOSIS — I10 Essential (primary) hypertension: Secondary | ICD-10-CM | POA: Diagnosis not present

## 2018-05-03 DIAGNOSIS — R7989 Other specified abnormal findings of blood chemistry: Secondary | ICD-10-CM | POA: Diagnosis not present

## 2018-05-03 DIAGNOSIS — I480 Paroxysmal atrial fibrillation: Secondary | ICD-10-CM | POA: Diagnosis not present

## 2018-05-03 DIAGNOSIS — E7800 Pure hypercholesterolemia, unspecified: Secondary | ICD-10-CM | POA: Diagnosis not present

## 2018-05-03 DIAGNOSIS — I5032 Chronic diastolic (congestive) heart failure: Secondary | ICD-10-CM | POA: Diagnosis not present

## 2018-05-03 DIAGNOSIS — M810 Age-related osteoporosis without current pathological fracture: Secondary | ICD-10-CM | POA: Diagnosis not present

## 2018-05-03 DIAGNOSIS — I69998 Other sequelae following unspecified cerebrovascular disease: Secondary | ICD-10-CM | POA: Diagnosis not present

## 2018-05-03 DIAGNOSIS — I6789 Other cerebrovascular disease: Secondary | ICD-10-CM | POA: Diagnosis not present

## 2018-05-03 DIAGNOSIS — E559 Vitamin D deficiency, unspecified: Secondary | ICD-10-CM | POA: Diagnosis not present

## 2018-05-03 DIAGNOSIS — Z6822 Body mass index (BMI) 22.0-22.9, adult: Secondary | ICD-10-CM | POA: Diagnosis not present

## 2018-05-03 DIAGNOSIS — E038 Other specified hypothyroidism: Secondary | ICD-10-CM | POA: Diagnosis not present

## 2018-05-03 DIAGNOSIS — Z7901 Long term (current) use of anticoagulants: Secondary | ICD-10-CM | POA: Diagnosis not present

## 2018-05-04 DIAGNOSIS — N390 Urinary tract infection, site not specified: Secondary | ICD-10-CM | POA: Diagnosis not present

## 2018-05-04 DIAGNOSIS — R350 Frequency of micturition: Secondary | ICD-10-CM | POA: Diagnosis not present

## 2018-05-10 ENCOUNTER — Ambulatory Visit (INDEPENDENT_AMBULATORY_CARE_PROVIDER_SITE_OTHER): Payer: Medicare Other

## 2018-05-10 DIAGNOSIS — I481 Persistent atrial fibrillation: Secondary | ICD-10-CM | POA: Diagnosis not present

## 2018-05-10 DIAGNOSIS — Z5181 Encounter for therapeutic drug level monitoring: Secondary | ICD-10-CM

## 2018-05-10 DIAGNOSIS — Z8673 Personal history of transient ischemic attack (TIA), and cerebral infarction without residual deficits: Secondary | ICD-10-CM | POA: Diagnosis not present

## 2018-05-10 DIAGNOSIS — I4819 Other persistent atrial fibrillation: Secondary | ICD-10-CM

## 2018-05-10 LAB — POCT INR: INR: 3.7 — AB (ref 2.0–3.0)

## 2018-05-10 NOTE — Patient Instructions (Signed)
Description   Skip your Coumadin tomorrow, then start taking 1/2 tablet daily except 1 tablet on Mondays and Fridays.  Repeat INR in 2 weeks. Call us with any pending procedure, new medication or concerns to coumadin clinic # [phone].

## 2018-05-11 ENCOUNTER — Encounter: Payer: Self-pay | Admitting: Cardiovascular Disease

## 2018-05-11 ENCOUNTER — Ambulatory Visit (INDEPENDENT_AMBULATORY_CARE_PROVIDER_SITE_OTHER): Payer: Medicare Other | Admitting: Cardiovascular Disease

## 2018-05-11 VITALS — BP 121/68 | HR 67 | Wt 126.4 lb

## 2018-05-11 DIAGNOSIS — I639 Cerebral infarction, unspecified: Secondary | ICD-10-CM

## 2018-05-11 DIAGNOSIS — I481 Persistent atrial fibrillation: Secondary | ICD-10-CM | POA: Diagnosis not present

## 2018-05-11 DIAGNOSIS — I4819 Other persistent atrial fibrillation: Secondary | ICD-10-CM

## 2018-05-11 DIAGNOSIS — Z5181 Encounter for therapeutic drug level monitoring: Secondary | ICD-10-CM

## 2018-05-11 DIAGNOSIS — I10 Essential (primary) hypertension: Secondary | ICD-10-CM | POA: Diagnosis not present

## 2018-05-11 DIAGNOSIS — E7800 Pure hypercholesterolemia, unspecified: Secondary | ICD-10-CM | POA: Diagnosis not present

## 2018-05-11 NOTE — Progress Notes (Signed)
Cardiology Office Note   Date:  05/11/2018   ID:  [name], DOB May 25, 1930, MRN [id number]  PCP:  Janice Garbe, MD  Cardiologist:   Janice Si, MD   Chief Complaint  Patient presents with  . Follow-up    History of Present Illness: [name] is a 84 y.o. female with chronic diastolic heart failure, persistent atrial fibrillation, hyperlipidemia and hypothyroidism who presents for follow up.  She was previously a patient of Dr. Patty Martinez.  She had a [employer] 08/2015. She was noted to be in atrial fibrillation, but there is no evidence of ischemia.  Echocardiogram 01/2012 revealed LVEF 50-55%.  [name] had a thoracic  vertebral compression fracture and had to undergo kyphoplasty.   Warfarin was held and she was bridged with Lovenox.  This was complicated by a paraspinal hematoma.  She suffered from another vertebral fracture and struggles with pain in her back.  She is not a candidate for any further interventions.  She has been working with pain management and takes oxycodone at night to sleep. She also broken her left wrist right shoulder and just finished physical therapy.   [name] was feeling well until 3 days ago when she started having pain in her left lower back and into her buttocks.  She denies any falls or increased physical activity.  She has not experienced any chest pain or shortness of breath.  At her last appointment her blood pressure was low and she noted gait instability.  Amlodipine was reduced to 2.5 mg.  She brings up a log of her blood pressure showing it is mostly been in the 110s to 140s.  She ran out of amlodipine 1 week ago and her blood pressures been in the 110s to 120s.  She has not noted any lower extremity edema, orthopnea, or PND.  She denies palpitations, lightheadedness or dizziness.   Past Medical History:  Diagnosis Date  . Atrial fibrillation (HCC)   . Chronic anticoagulation   .  Chronic anticoagulation   . CVA (cerebrovascular accident) (HCC)   . Diabetes mellitus   . History of chicken pox   . Hyperlipidemia   . Hypertension     Past Surgical History:  Procedure Laterality Date  . APPENDECTOMY    . BACK SURGERY    . CARDIAC CATHETERIZATION  11/09/91   EF 70%  . DILATION AND CURETTAGE OF UTERUS       Current Outpatient Medications  Medication Sig Dispense Refill  . atorvastatin (LIPITOR) 20 MG tablet Take 0.5 tablets (10 mg total) by mouth every morning. (Patient not taking: Reported on 05/11/2018) 30 tablet 6  . diazepam (VALIUM) 2 MG tablet TAKE 1 TABLET BY MOUTH TWICE DAILY AS NEEDED FOR ANXIETY (Patient not taking: Reported on 05/11/2018) 90 tablet 0  . furosemide (LASIX) 20 MG tablet Take 10 mg by mouth as needed for fluid or edema.    Marland Kitchen levothyroxine (SYNTHROID, LEVOTHROID) 25 MCG tablet TAKE 1 TABLET BY MOUTH ONCE DAILY BEFORE  BREAKFAST (Patient not taking: Reported on 05/11/2018) 90 tablet 0  . nadolol (CORGARD) 20 MG tablet TAKE 1/2 (ONE-HALF) TABLET BY MOUTH ONCE DAILY (Patient not taking: Reported on 05/11/2018) 45 tablet 0  . nitroGLYCERIN (NITROSTAT) 0.4 MG SL tablet Place 0.4 mg under the tongue every 5 (five) minutes as needed (chest pain).     Marland Kitchen oxyCODONE-acetaminophen (PERCOCET/ROXICET) 5-325 MG tablet Take 1 tablet by mouth every 4 (four) hours as  needed for severe pain.     . potassium chloride (K-DUR) 10 MEQ tablet TAKE ONE TABLET BY MOUTH ONCE DAILY (Patient not taking: Reported on 05/11/2018) 90 tablet 3  . warfarin (COUMADIN) 5 MG tablet Take 1/2 to 1 tablet daily as directed by coumadin clinic (Patient not taking: Reported on 05/11/2018) 90 tablet 0   No current facility-administered medications for this visit.     Allergies:   Cozaar; Hydralazine; Hyzaar [losartan potassium-hctz]; Sulfa drugs cross reactors; and Lisinopril    Social History:  The patient  reports that she quit smoking about 19 years ago. She has never used smokeless  tobacco. She reports that she does not drink alcohol or use drugs.   Family History:  The patient's family history includes Heart disease in her father and mother; Heart failure in her brother; Hypertension in her mother; Stroke in her mother.    ROS:  Please see the history of present illness.   Otherwise, review of systems are positive for none.   All other systems are reviewed and negative.    PHYSICAL EXAM: VS:  BP 121/68   Pulse 67   Wt 126 lb 6.4 oz (57.3 kg)   BMI 23.12 kg/m  , BMI Body mass index is 23.12 kg/m. GENERAL:  Well appearing HEENT: Pupils equal round and reactive, fundi not visualized, oral mucosa unremarkable NECK:  No jugular venous distention, waveform within normal limits, carotid upstroke brisk and symmetric, no bruits LUNGS:  Clear to auscultation bilaterally HEART:  Irregularly irregular.  PMI not displaced or sustained,S1 and S2 within normal limits, no S3, no S4, no clicks, no rubs, no murmurs ABD:  Flat, positive bowel sounds normal in frequency in pitch, no bruits, no rebound, no guarding, no midline pulsatile mass, no hepatomegaly, no splenomegaly EXT:  2 plus pulses throughout, no edema, no cyanosis no clubbing SKIN:  No rashes no nodules NEURO:  Cranial nerves II through XII grossly intact, motor grossly intact throughout PSYCH:  Cognitively intact, oriented to person place and time   EKG:  EKG is not ordered today. The ekg ordered today demonstrates atrial fibrillation rate 86 bpm. 02/14/18: Atrial flutter.  Rate 62 bpm.   4:1 AV conduction.  LAFB.     Recent Labs: 10/13/2017: ALT 7; BUN 17; Creat 0.83; Hemoglobin 14.0; Platelets 249; Potassium 4.1; Sodium 140; TSH 1.87    Lipid Panel    Component Value Date/Time   CHOL 160 10/13/2017 1658   TRIG 95 10/13/2017 1658   HDL 64 10/13/2017 1658   CHOLHDL 2.5 10/13/2017 1658   VLDL 19.4 03/31/2017 1517   LDLCALC 78 10/13/2017 1658      Wt Readings from Last 3 Encounters:  05/11/18 126 lb 6.4  oz (57.3 kg)  04/12/18 122 lb 9.6 oz (55.6 kg)  02/14/18 124 lb 12.8 oz (56.6 kg)      ASSESSMENT AND PLAN:  # Persistent atrial fibrillation: [name] remains in atrial fibrillation.  Her rate is controlled on nadolol.  Continue warfarin.  # L hip pain: Likely MSK.  However she has a history of spontaneous bleed in the setting of bridging warfarin with Lovenox.  We will check a CBC today.  # Hypertension:  Blood pressure is well-controlled.  She has not been taking amlodipine for the last week and her blood pressure is controlled.  Therefore we will stop this medicine.  Continue nadolol.  # Hyperlipidemia: LDL 78 on 09/2017.  Continue atorvastatin.    Current medicines are reviewed at  length with the patient today.  The patient does not have concerns regarding medicines.  The following changes have been made:  no change  Labs/ tests ordered today include:   Orders Placed This Encounter  Procedures  . CBC with Differential/Platelet     Disposition:   FU with Leanah Kolander C. Duke Salviaandolph, MD, [facility] in 4 months.     Signed, Sarely Stracener C. Duke Salviaandolph, MD, [facility]  05/11/2018 2:29 PM    [facility]

## 2018-05-11 NOTE — Patient Instructions (Signed)
Medication Instructions:  STOP AMLODIPINE   Labwork: CBC TODAY   Testing/Procedures: NONE  Follow-Up: Your physician recommends that you schedule a follow-up appointment in: 4 MONTH OV  If you need a refill on your cardiac medications before your next appointment, please call your pharmacy.

## 2018-05-12 LAB — CBC WITH DIFFERENTIAL/PLATELET
Basophils Absolute: 0 10*3/uL (ref 0.0–0.2)
Basos: 0 %
EOS (ABSOLUTE): 0.1 10*3/uL (ref 0.0–0.4)
Eos: 1 %
Hematocrit: 44.3 % (ref 34.0–46.6)
Hemoglobin: 14.6 g/dL (ref 11.1–15.9)
Immature Grans (Abs): 0 10*3/uL (ref 0.0–0.1)
Immature Granulocytes: 0 %
Lymphocytes Absolute: 1.9 10*3/uL (ref 0.7–3.1)
Lymphs: 26 %
MCH: 31.3 pg (ref 26.6–33.0)
MCHC: 33 g/dL (ref 31.5–35.7)
MCV: 95 fL (ref 79–97)
Monocytes Absolute: 0.4 10*3/uL (ref 0.1–0.9)
Monocytes: 6 %
Neutrophils Absolute: 4.9 10*3/uL (ref 1.4–7.0)
Neutrophils: 67 %
Platelets: 230 10*3/uL (ref 150–450)
RBC: 4.66 x10E6/uL (ref 3.77–5.28)
RDW: 12.1 % — ABNORMAL LOW (ref 12.3–15.4)
WBC: 7.4 10*3/uL (ref 3.4–10.8)

## 2018-05-24 ENCOUNTER — Other Ambulatory Visit: Payer: Medicare Other

## 2018-05-24 ENCOUNTER — Ambulatory Visit (INDEPENDENT_AMBULATORY_CARE_PROVIDER_SITE_OTHER)
Admission: RE | Admit: 2018-05-24 | Discharge: 2018-05-24 | Disposition: A | Discharge status: Home / Self Care | Payer: Medicare Other | Source: Ambulatory Visit | Attending: Endocrinology | Admitting: Endocrinology

## 2018-05-24 DIAGNOSIS — M810 Age-related osteoporosis without current pathological fracture: Secondary | ICD-10-CM

## 2018-05-25 ENCOUNTER — Other Ambulatory Visit: Payer: Self-pay | Admitting: Cardiovascular Disease

## 2018-05-26 ENCOUNTER — Ambulatory Visit (INDEPENDENT_AMBULATORY_CARE_PROVIDER_SITE_OTHER): Payer: Medicare Other | Admitting: Pharmacist

## 2018-05-26 DIAGNOSIS — Z5181 Encounter for therapeutic drug level monitoring: Secondary | ICD-10-CM

## 2018-05-26 LAB — POCT INR: INR: 3.1 — AB (ref 2.0–3.0)

## 2018-05-26 NOTE — Patient Instructions (Signed)
Description   Skip your Coumadin tomorrow (since already took today), then start taking 1/2 tablet daily except 1 tablet on Mondays and Fridays.  Repeat INR in 2 weeks. Call us with any pending procedure, new medication or concerns to coumadin clinic # [phone].

## 2018-05-26 NOTE — Telephone Encounter (Signed)
Rx sent to pharmacy   

## 2018-05-30 ENCOUNTER — Other Ambulatory Visit: Payer: Self-pay

## 2018-06-09 ENCOUNTER — Ambulatory Visit (INDEPENDENT_AMBULATORY_CARE_PROVIDER_SITE_OTHER): Payer: Medicare Other | Admitting: *Deleted

## 2018-06-09 DIAGNOSIS — Z5181 Encounter for therapeutic drug level monitoring: Secondary | ICD-10-CM

## 2018-06-09 LAB — POCT INR: INR: 2.6 (ref 2.0–3.0)

## 2018-06-09 NOTE — Patient Instructions (Signed)
Description   Continue taking 1/2 tablet daily except 1 tablet on Mondays and Fridays.  Repeat INR in 3 weeks. Call us with any pending procedure, new medication or concerns to coumadin clinic # [phone].

## 2018-06-15 DIAGNOSIS — M810 Age-related osteoporosis without current pathological fracture: Secondary | ICD-10-CM | POA: Diagnosis not present

## 2018-06-15 DIAGNOSIS — I10 Essential (primary) hypertension: Secondary | ICD-10-CM | POA: Diagnosis not present

## 2018-06-15 DIAGNOSIS — E038 Other specified hypothyroidism: Secondary | ICD-10-CM | POA: Diagnosis not present

## 2018-06-15 DIAGNOSIS — E7800 Pure hypercholesterolemia, unspecified: Secondary | ICD-10-CM | POA: Diagnosis not present

## 2018-06-15 DIAGNOSIS — Z7901 Long term (current) use of anticoagulants: Secondary | ICD-10-CM | POA: Diagnosis not present

## 2018-06-15 DIAGNOSIS — I69998 Other sequelae following unspecified cerebrovascular disease: Secondary | ICD-10-CM | POA: Diagnosis not present

## 2018-06-15 DIAGNOSIS — I480 Paroxysmal atrial fibrillation: Secondary | ICD-10-CM | POA: Diagnosis not present

## 2018-06-15 DIAGNOSIS — Z23 Encounter for immunization: Secondary | ICD-10-CM | POA: Diagnosis not present

## 2018-06-15 DIAGNOSIS — E559 Vitamin D deficiency, unspecified: Secondary | ICD-10-CM | POA: Diagnosis not present

## 2018-06-15 DIAGNOSIS — Z6822 Body mass index (BMI) 22.0-22.9, adult: Secondary | ICD-10-CM | POA: Diagnosis not present

## 2018-06-15 DIAGNOSIS — R7989 Other specified abnormal findings of blood chemistry: Secondary | ICD-10-CM | POA: Diagnosis not present

## 2018-06-18 ENCOUNTER — Other Ambulatory Visit: Payer: Self-pay | Admitting: Cardiovascular Disease

## 2018-06-18 DIAGNOSIS — E039 Hypothyroidism, unspecified: Secondary | ICD-10-CM

## 2018-06-30 ENCOUNTER — Ambulatory Visit (INDEPENDENT_AMBULATORY_CARE_PROVIDER_SITE_OTHER): Payer: Medicare Other

## 2018-06-30 DIAGNOSIS — I4819 Other persistent atrial fibrillation: Secondary | ICD-10-CM

## 2018-06-30 DIAGNOSIS — I481 Persistent atrial fibrillation: Secondary | ICD-10-CM

## 2018-06-30 DIAGNOSIS — Z5181 Encounter for therapeutic drug level monitoring: Secondary | ICD-10-CM | POA: Diagnosis not present

## 2018-06-30 LAB — POCT INR: INR: 2.3 (ref 2.0–3.0)

## 2018-06-30 NOTE — Patient Instructions (Signed)
Description   Continue on same dosage 1/2 tablet daily except 1 tablet on Mondays and Fridays.  Repeat INR in 4 weeks. Call us with any pending procedure, new medication or concerns to coumadin clinic # [phone].

## 2018-07-11 ENCOUNTER — Other Ambulatory Visit: Payer: Self-pay | Admitting: Family Medicine

## 2018-07-12 DIAGNOSIS — M545 Low back pain: Secondary | ICD-10-CM | POA: Diagnosis not present

## 2018-07-20 ENCOUNTER — Other Ambulatory Visit: Payer: Self-pay | Admitting: Cardiovascular Disease

## 2018-07-26 ENCOUNTER — Other Ambulatory Visit: Payer: Self-pay | Admitting: Family Medicine

## 2018-07-26 ENCOUNTER — Other Ambulatory Visit: Payer: Self-pay | Admitting: Cardiovascular Disease

## 2018-07-26 DIAGNOSIS — F419 Anxiety disorder, unspecified: Secondary | ICD-10-CM

## 2018-07-26 NOTE — Telephone Encounter (Signed)
Last OV 10/13/17 Valium last filled 03/08/18 #90 with 0

## 2018-07-28 ENCOUNTER — Other Ambulatory Visit: Payer: Self-pay | Admitting: Cardiovascular Disease

## 2018-07-28 ENCOUNTER — Ambulatory Visit (INDEPENDENT_AMBULATORY_CARE_PROVIDER_SITE_OTHER): Payer: Medicare Other | Admitting: *Deleted

## 2018-07-28 DIAGNOSIS — Z5181 Encounter for therapeutic drug level monitoring: Secondary | ICD-10-CM

## 2018-07-28 LAB — POCT INR: INR: 2.4 (ref 2.0–3.0)

## 2018-07-28 NOTE — Patient Instructions (Signed)
Description   Continue on same dosage 1/2 tablet daily except 1 tablet on Mondays and Fridays.  Repeat INR in 5 weeks. Call us with any pending procedure, new medication or concerns to coumadin clinic # [phone].

## 2018-08-04 ENCOUNTER — Telehealth: Payer: Self-pay | Admitting: *Deleted

## 2018-08-04 NOTE — Telephone Encounter (Signed)
   [facility] Pre-operative Risk Assessment    Request for surgical clearance:  1. What type of surgery is being performed? Epidural Injection   2. When is this surgery scheduled? TBD   3. What type of clearance is required (medical clearance vs. Pharmacy clearance to hold med vs. Both)? both  4. Are there any medications that need to be held prior to surgery and how long? Coumadin x 5 days   5. Practice name and name of physician performing surgery? [facility] Dr. Maryjean Ka   6. What is your office phone number [phone]    7.   What is your office fax number [phone]  8.   Anesthesia type (None, local, MAC, general) ?    Darlynn Ricco A Hercules Hasler 08/04/2018, 3:08 PM  _________________________________________________________________   (provider comments below)

## 2018-08-10 NOTE — Telephone Encounter (Signed)
Patient with diagnosis of atrial fibrillation on warfarin for anticoagulation.    Procedure: epidural injection Date of procedure: TBD  CHADS2-VASc score of  6 (CHF, HTN, AGE,, stroke/tia x 2,  AGE, female)  CrCl 28 Platelet count 230  Patient will need to hold warfarin x 5 days and a  lovenox bridge based on prior stroke.  However she has history of paraspinal hematoma during prior bridge event    Will forward to Dr. Duke Salvia for final decision.

## 2018-08-10 NOTE — Telephone Encounter (Signed)
   Primary Cardiologist: Chilton Si, MD  Chart reviewed as part of pre-operative protocol coverage. Given past medical history and time since last visit, based on ACC/AHA guidelines, Shalandra Leu would be at acceptable risk for the planned procedure without further cardiovascular testing.   Waiting coumadin clearance from pharmacy.   [place], [place] 08/10/2018, 12:17 PM

## 2018-08-11 NOTE — Telephone Encounter (Signed)
I would not bridge her again.  I think the bleeding risk outweighs the stroke risk.

## 2018-08-12 NOTE — Telephone Encounter (Signed)
   Primary Cardiologist: Chilton Si, MD  Chart reviewed as part of pre-operative protocol coverage. Given past medical history and time since last visit, based on ACC/AHA guidelines, Nathaniel Yaden would be at acceptable risk for the planned procedure without further cardiovascular testing.   OK to to hold Coumadin 5 days pre op, Dr Duke Salvia does not recommend Lovenox crossover.   I will route this recommendation to the requesting party via Epic fax function and remove from pre-op pool.  Please call with questions.  Corine Shelter, PA-C 08/12/2018, 8:46 AM

## 2018-08-29 ENCOUNTER — Other Ambulatory Visit: Payer: Self-pay | Admitting: Cardiovascular Disease

## 2018-09-01 ENCOUNTER — Ambulatory Visit (INDEPENDENT_AMBULATORY_CARE_PROVIDER_SITE_OTHER): Payer: Medicare Other | Admitting: Pharmacist

## 2018-09-01 DIAGNOSIS — Z5181 Encounter for therapeutic drug level monitoring: Secondary | ICD-10-CM

## 2018-09-01 LAB — POCT INR: INR: 2.8 (ref 2.0–3.0)

## 2018-09-01 NOTE — Patient Instructions (Signed)
Description   Continue on same dosage 1/2 tablet daily except 1 tablet on Mondays and Fridays.  Repeat INR in 6 weeks. Call us with any pending procedure, new medication or concerns to coumadin clinic # [phone].

## 2018-09-07 ENCOUNTER — Other Ambulatory Visit: Payer: Self-pay

## 2018-09-13 DIAGNOSIS — I5032 Chronic diastolic (congestive) heart failure: Secondary | ICD-10-CM | POA: Diagnosis not present

## 2018-09-13 DIAGNOSIS — Z6822 Body mass index (BMI) 22.0-22.9, adult: Secondary | ICD-10-CM | POA: Diagnosis not present

## 2018-09-13 DIAGNOSIS — I480 Paroxysmal atrial fibrillation: Secondary | ICD-10-CM | POA: Diagnosis not present

## 2018-09-13 DIAGNOSIS — Z7901 Long term (current) use of anticoagulants: Secondary | ICD-10-CM | POA: Diagnosis not present

## 2018-09-13 DIAGNOSIS — E038 Other specified hypothyroidism: Secondary | ICD-10-CM | POA: Diagnosis not present

## 2018-09-13 DIAGNOSIS — E7800 Pure hypercholesterolemia, unspecified: Secondary | ICD-10-CM | POA: Diagnosis not present

## 2018-09-13 DIAGNOSIS — I10 Essential (primary) hypertension: Secondary | ICD-10-CM | POA: Diagnosis not present

## 2018-09-14 DIAGNOSIS — N390 Urinary tract infection, site not specified: Secondary | ICD-10-CM | POA: Diagnosis not present

## 2018-09-20 ENCOUNTER — Encounter: Payer: Self-pay | Admitting: Cardiovascular Disease

## 2018-09-20 ENCOUNTER — Ambulatory Visit (INDEPENDENT_AMBULATORY_CARE_PROVIDER_SITE_OTHER): Payer: Medicare Other | Admitting: Cardiovascular Disease

## 2018-09-20 VITALS — BP 122/88 | HR 67 | Ht 62.0 in | Wt 122.0 lb

## 2018-09-20 DIAGNOSIS — I10 Essential (primary) hypertension: Secondary | ICD-10-CM | POA: Diagnosis not present

## 2018-09-20 DIAGNOSIS — I4819 Other persistent atrial fibrillation: Secondary | ICD-10-CM

## 2018-09-20 DIAGNOSIS — E7800 Pure hypercholesterolemia, unspecified: Secondary | ICD-10-CM | POA: Diagnosis not present

## 2018-09-20 DIAGNOSIS — I639 Cerebral infarction, unspecified: Secondary | ICD-10-CM | POA: Diagnosis not present

## 2018-09-20 DIAGNOSIS — G459 Transient cerebral ischemic attack, unspecified: Secondary | ICD-10-CM

## 2018-09-20 DIAGNOSIS — R82998 Other abnormal findings in urine: Secondary | ICD-10-CM | POA: Diagnosis not present

## 2018-09-20 NOTE — Patient Instructions (Addendum)
Medication Instructions:  Your physician recommends that you continue on your current medications as directed. Please refer to the Current Medication list given to you today. If you need a refill on your cardiac medications before your next appointment, please call your pharmacy.   Lab work: none  Testing/Procedures: NON CONTRAST HEAD CT  Follow-Up: [facility], you and your health needs are our priority.  As part of our continuing mission to provide you with exceptional heart care, we have created designated Provider Care Teams.  These Care Teams include your primary Cardiologist (physician) and Advanced Practice Providers (APPs -  Physician Assistants and Nurse Practitioners) who all work together to provide you with the care you need, when you need it. You will need a follow up appointment in 12 months.  Please call our office 2 months in advance to schedule this appointment.  You may see Chilton Siiffany [place], MD or one of the following Advanced Practice Providers on your designated Care Team:   Corine ShelterLuke Kilroy, PA-C Judy PimpleKrista Kroeger, [place] JerseyPA-C . Marjie Skiffallie Goodrich, PA-C  You have been referred to NEUROLOGY. IF YOU DO NOT HEAR FROM THE OFFICE YOU CAN CALL THEM DIRECTLY AT [phone]

## 2018-09-20 NOTE — Addendum Note (Signed)
Addended by: Regis BillPRATT, Ritesh Opara B on: 09/20/2018 05:34 PM   Modules accepted: Orders

## 2018-09-20 NOTE — Progress Notes (Signed)
Cardiology Office Note   Date:  09/20/2018   ID:  Pincus LargeCarolyn W White Reichard, DOB March 22, 1930, MRN [id number]  PCP:  Gaspar Garbeisovec, Richard W, MD  Cardiologist:   Chilton Siiffany Geneva, MD   No chief complaint on file.   History of Present Illness: Pincus LargeCarolyn W White Reichard is an 84 y.o. female with chronic diastolic heart failure, persistent atrial fibrillation, hyperlipidemia and hypothyroidism who presents for follow up.  She was previously a patient of Dr. Patty SermonsBrackbill.  She had a [employer] 08/2015. She was noted to be in atrial fibrillation, but there is no evidence of ischemia.  Echocardiogram 01/2012 revealed LVEF 50-55%.  Ms. Halina AndreasWhite Reichard had a thoracic  vertebral compression fracture and had to undergo kyphoplasty.   Warfarin was held and she was bridged with Lovenox.  This was complicated by a paraspinal hematoma.  She suffered from another vertebral fracture and struggles with pain in her back.  She is not a candidate for any further interventions.  She has been working with pain management and takes oxycodone at night to sleep. She also broken her left wrist right shoulder and just finished physical therapy.   Ms. Halina AndreasWhite Reichard has been struggling with back pain.  She underwent a back procedure that sounds like a kyphoplasty and felt well for about 1 week.  However she then developed problems with the vertebrae above.  She saw a different orthopedic surgeon for second opinion and he recommended that she not have any further procedures but instead proceed with pain management.  She has been seeing a pain management specialist who recommended that she have an injection.  Given her history of bleeding on Lovenox a decision was made not to bridge her.  She held warfarin for 2 days.  On the second day she developed acute confusion that lasted for several hours.  She started shouting at her husband and asking, "where is my staff?".  She was unable to tell her daughter her name.  Her husband reports  that she was leaning to one side and nearly fell.  The family elected to resume her warfarin and the symptoms resolved by the following day.  Ms. Halina AndreasWhite Reichard has been otherwise feeling well.  She has no chest pain or shortness of breath.  She denies lower extremity edema, orthopnea or PND.  She denies palpitations, lightheadedness, or dizziness.   Past Medical History:  Diagnosis Date  . Atrial fibrillation (HCC)   . Chronic anticoagulation   . Chronic anticoagulation   . CVA (cerebrovascular accident) (HCC)   . Diabetes mellitus   . History of chicken pox   . Hyperlipidemia   . Hypertension     Past Surgical History:  Procedure Laterality Date  . APPENDECTOMY    . BACK SURGERY    . CARDIAC CATHETERIZATION  11/09/91   EF 70%  . DILATION AND CURETTAGE OF UTERUS       Current Outpatient Medications  Medication Sig Dispense Refill  . atorvastatin (LIPITOR) 20 MG tablet TAKE 1/2 (ONE-HALF) TABLET BY MOUTH ONCE DAILY IN THE MORNING 30 tablet 6  . diazepam (VALIUM) 2 MG tablet TAKE 1 TABLET BY MOUTH TWICE DAILY AS NEEDED FOR ANXIETY 90 tablet 0  . furosemide (LASIX) 20 MG tablet Take 10 mg by mouth as needed for fluid or edema.    Marland Kitchen. levothyroxine (SYNTHROID, LEVOTHROID) 25 MCG tablet TAKE 1 TABLET BY MOUTH ONCE DAILY BEFORE  BREAKFAST 90 tablet 0  . nadolol (CORGARD) 20 MG tablet TAKE 1/2 (  ONE-HALF) TABLET BY MOUTH ONCE DAILY 45 tablet 1  . nitroGLYCERIN (NITROSTAT) 0.4 MG SL tablet Place 0.4 mg under the tongue every 5 (five) minutes as needed (chest pain).     Marland Kitchen oxyCODONE-acetaminophen (PERCOCET/ROXICET) 5-325 MG tablet Take 1 tablet by mouth every 4 (four) hours as needed for severe pain.     . potassium chloride (K-DUR,KLOR-CON) 10 MEQ tablet TAKE 1 TABLET BY MOUTH ONCE DAILY 90 tablet 1  . warfarin (COUMADIN) 5 MG tablet TAKE 1/2 TO 1 (ONE-HALF TO ONE) TABLET BY MOUTH ONCE DAILY AS DIRECTED BY  COUMADIN  CLINIC 90 tablet 1   No current facility-administered medications for this  visit.     Allergies:   Cozaar; Hydralazine; Hyzaar [losartan potassium-hctz]; Sulfa drugs cross reactors; and Lisinopril    Social History:  The patient  reports that she quit smoking about 19 years ago. She has never used smokeless tobacco. She reports that she does not drink alcohol or use drugs.   Family History:  The patient's family history includes Heart disease in her father and mother; Heart failure in her brother; Hypertension in her mother; Stroke in her mother.    ROS:  Please see the history of present illness.   Otherwise, review of systems are positive for none.   All other systems are reviewed and negative.    PHYSICAL EXAM: VS:  BP 122/88   Pulse 67   Ht 5\' 2"  (1.575 m)   Wt 122 lb (55.3 kg)   BMI 22.31 kg/m  , BMI Body mass index is 22.31 kg/m. GENERAL:  Well appearing HEENT: Pupils equal round and reactive, fundi not visualized, oral mucosa unremarkable NECK:  No jugular venous distention, waveform within normal limits, carotid upstroke brisk and symmetric, no bruits LUNGS:  Clear to auscultation bilaterally HEART:  Irregularly irregular.  PMI not displaced or sustained,S1 and S2 within normal limits, no S3, no S4, no clicks, no rubs, no murmurs ABD:  Flat, positive bowel sounds normal in frequency in pitch, no bruits, no rebound, no guarding, no midline pulsatile mass, no hepatomegaly, no splenomegaly EXT:  2 plus pulses throughout, no edema, no cyanosis no clubbing SKIN:  No rashes no nodules NEURO:  Cranial nerves II through XII grossly intact, motor grossly intact throughout PSYCH:  Cognitively intact, oriented to person place and time   EKG:  EKG is ordered today. The ekg ordered today demonstrates atrial fibrillation rate 86 bpm. 02/14/18: Atrial flutter.  Rate 62 bpm.   4:1 AV conduction.  LAFB.   09/20/18: Atrial flutter.  Rate 67 bpm.  4:1 AV conduction.  LAD.     Recent Labs: 10/13/2017: ALT 7; BUN 17; Creat 0.83; Potassium 4.1; Sodium 140; TSH  1.87 05/11/2018: Hemoglobin 14.6; Platelets 230    Lipid Panel    Component Value Date/Time   CHOL 160 10/13/2017 1658   TRIG 95 10/13/2017 1658   HDL 64 10/13/2017 1658   CHOLHDL 2.5 10/13/2017 1658   VLDL 19.4 03/31/2017 1517   LDLCALC 78 10/13/2017 1658      Wt Readings from Last 3 Encounters:  09/20/18 122 lb (55.3 kg)  05/11/18 126 lb 6.4 oz (57.3 kg)  04/12/18 122 lb 9.6 oz (55.6 kg)      ASSESSMENT AND PLAN:  # Persistent atrial fibrillation/flutter::  Ms. Yareni Creps remains in atrial flutter.  Rate is well controlled.  Continue warfarin.  She previously developed a paraspinal hematoma when bridging with Lovenox, so we will not do this again.  She held Coumadin for 2 days for procedure and developed some confusion.  It is unclear if episode was a TIA.  During her interview today she frequently lost her train of thought and became somewhat confused.  I am concerned that it may have also been sundowning.  We will get a noncontrast head CT.  We will also refer her to neurology to help better differentiate between the 2.  For now I would not recommend holding her warfarin for any procedures unless they are very urgent or emergent.  # PossibleTIA: Head CT and neurology referral as above.  # Hypertension:  Blood pressure is well-controlled.  Continue nadolol.   # Hyperlipidemia: LDL 78 on 09/2017.  Check fasting lipids.    Current medicines are reviewed at length with the patient today.  The patient does not have concerns regarding medicines.  The following changes have been made:  no change  Labs/ tests ordered today include:   Orders Placed This Encounter  Procedures  . CT Head Wo Contrast  . Ambulatory referral to Neurology    Time spent: 40 minutes-Greater than 50% of this time was spent in counseling, explanation of diagnosis, planning of further management, and coordination of care.   Disposition:   FU with Maxten Shuler C. Duke Salvia, MD, [facility] in 1  year.    Signed, Pierce Biagini C. Duke Salvia, MD, [facility]  09/20/2018 5:29 PM    [facility]

## 2018-09-21 ENCOUNTER — Ambulatory Visit (INDEPENDENT_AMBULATORY_CARE_PROVIDER_SITE_OTHER)
Admission: RE | Admit: 2018-09-21 | Discharge: 2018-09-21 | Disposition: A | Discharge status: Home / Self Care | Payer: Medicare Other | Source: Ambulatory Visit | Attending: Cardiovascular Disease | Admitting: Cardiovascular Disease

## 2018-09-21 DIAGNOSIS — G459 Transient cerebral ischemic attack, unspecified: Secondary | ICD-10-CM | POA: Diagnosis not present

## 2018-09-21 DIAGNOSIS — G9389 Other specified disorders of brain: Secondary | ICD-10-CM | POA: Diagnosis not present

## 2018-09-26 ENCOUNTER — Telehealth: Payer: Self-pay

## 2018-09-26 NOTE — Telephone Encounter (Signed)
Spoke with the husband today attempting to get pt scheduled for Prolia- husband stated pt was not feeling well and would call me back- patient owes $0 and can get any day after 10/12/18

## 2018-10-03 ENCOUNTER — Emergency Department (HOSPITAL_COMMUNITY): Payer: Medicare Other

## 2018-10-03 ENCOUNTER — Inpatient Hospital Stay (HOSPITAL_COMMUNITY): Payer: Medicare Other

## 2018-10-03 ENCOUNTER — Encounter (HOSPITAL_COMMUNITY)
Admission: EM | Disposition: A | Discharge status: Rehabilitation Facility | Payer: Self-pay | Source: Home / Self Care | Attending: Neurology

## 2018-10-03 ENCOUNTER — Emergency Department (HOSPITAL_COMMUNITY): Payer: Medicare Other | Admitting: Anesthesiology

## 2018-10-03 ENCOUNTER — Encounter (HOSPITAL_COMMUNITY): Payer: Self-pay | Admitting: Anesthesiology

## 2018-10-03 ENCOUNTER — Inpatient Hospital Stay (HOSPITAL_COMMUNITY)
Admission: EM | Admit: 2018-10-03 | Discharge: 2018-10-06 | DRG: 064 | Disposition: A | Discharge status: Rehabilitation Facility | Payer: Medicare Other | Attending: Neurology | Admitting: Neurology

## 2018-10-03 DIAGNOSIS — R29818 Other symptoms and signs involving the nervous system: Secondary | ICD-10-CM | POA: Diagnosis not present

## 2018-10-03 DIAGNOSIS — M549 Dorsalgia, unspecified: Secondary | ICD-10-CM | POA: Diagnosis not present

## 2018-10-03 DIAGNOSIS — R4781 Slurred speech: Secondary | ICD-10-CM | POA: Diagnosis not present

## 2018-10-03 DIAGNOSIS — G8929 Other chronic pain: Secondary | ICD-10-CM | POA: Diagnosis present

## 2018-10-03 DIAGNOSIS — R410 Disorientation, unspecified: Secondary | ICD-10-CM | POA: Diagnosis not present

## 2018-10-03 DIAGNOSIS — R1310 Dysphagia, unspecified: Secondary | ICD-10-CM | POA: Diagnosis present

## 2018-10-03 DIAGNOSIS — R402142 Coma scale, eyes open, spontaneous, at arrival to emergency department: Secondary | ICD-10-CM | POA: Diagnosis present

## 2018-10-03 DIAGNOSIS — I4819 Other persistent atrial fibrillation: Secondary | ICD-10-CM | POA: Diagnosis present

## 2018-10-03 DIAGNOSIS — I6602 Occlusion and stenosis of left middle cerebral artery: Secondary | ICD-10-CM | POA: Diagnosis not present

## 2018-10-03 DIAGNOSIS — R269 Unspecified abnormalities of gait and mobility: Secondary | ICD-10-CM | POA: Diagnosis not present

## 2018-10-03 DIAGNOSIS — M25551 Pain in right hip: Secondary | ICD-10-CM | POA: Diagnosis present

## 2018-10-03 DIAGNOSIS — Z823 Family history of stroke: Secondary | ICD-10-CM | POA: Diagnosis not present

## 2018-10-03 DIAGNOSIS — Z8249 Family history of ischemic heart disease and other diseases of the circulatory system: Secondary | ICD-10-CM

## 2018-10-03 DIAGNOSIS — E1122 Type 2 diabetes mellitus with diabetic chronic kidney disease: Secondary | ICD-10-CM | POA: Diagnosis present

## 2018-10-03 DIAGNOSIS — I6789 Other cerebrovascular disease: Secondary | ICD-10-CM | POA: Diagnosis not present

## 2018-10-03 DIAGNOSIS — Z87891 Personal history of nicotine dependence: Secondary | ICD-10-CM | POA: Diagnosis not present

## 2018-10-03 DIAGNOSIS — E785 Hyperlipidemia, unspecified: Secondary | ICD-10-CM | POA: Diagnosis present

## 2018-10-03 DIAGNOSIS — I129 Hypertensive chronic kidney disease with stage 1 through stage 4 chronic kidney disease, or unspecified chronic kidney disease: Secondary | ICD-10-CM | POA: Diagnosis present

## 2018-10-03 DIAGNOSIS — R402342 Coma scale, best motor response, flexion withdrawal, at arrival to emergency department: Secondary | ICD-10-CM | POA: Diagnosis present

## 2018-10-03 DIAGNOSIS — D62 Acute posthemorrhagic anemia: Secondary | ICD-10-CM | POA: Diagnosis not present

## 2018-10-03 DIAGNOSIS — I639 Cerebral infarction, unspecified: Secondary | ICD-10-CM | POA: Diagnosis not present

## 2018-10-03 DIAGNOSIS — I4821 Permanent atrial fibrillation: Secondary | ICD-10-CM | POA: Diagnosis not present

## 2018-10-03 DIAGNOSIS — R29713 NIHSS score 13: Secondary | ICD-10-CM | POA: Diagnosis not present

## 2018-10-03 DIAGNOSIS — E119 Type 2 diabetes mellitus without complications: Secondary | ICD-10-CM

## 2018-10-03 DIAGNOSIS — R471 Dysarthria and anarthria: Secondary | ICD-10-CM | POA: Diagnosis present

## 2018-10-03 DIAGNOSIS — R29712 NIHSS score 12: Secondary | ICD-10-CM | POA: Diagnosis not present

## 2018-10-03 DIAGNOSIS — M810 Age-related osteoporosis without current pathological fracture: Secondary | ICD-10-CM | POA: Diagnosis present

## 2018-10-03 DIAGNOSIS — R0682 Tachypnea, not elsewhere classified: Secondary | ICD-10-CM | POA: Diagnosis not present

## 2018-10-03 DIAGNOSIS — G459 Transient cerebral ischemic attack, unspecified: Secondary | ICD-10-CM | POA: Diagnosis not present

## 2018-10-03 DIAGNOSIS — Z7901 Long term (current) use of anticoagulants: Secondary | ICD-10-CM | POA: Diagnosis not present

## 2018-10-03 DIAGNOSIS — Z888 Allergy status to other drugs, medicaments and biological substances status: Secondary | ICD-10-CM | POA: Diagnosis not present

## 2018-10-03 DIAGNOSIS — Z79899 Other long term (current) drug therapy: Secondary | ICD-10-CM | POA: Diagnosis not present

## 2018-10-03 DIAGNOSIS — Z882 Allergy status to sulfonamides status: Secondary | ICD-10-CM | POA: Diagnosis not present

## 2018-10-03 DIAGNOSIS — N183 Chronic kidney disease, stage 3 unspecified: Secondary | ICD-10-CM | POA: Diagnosis present

## 2018-10-03 DIAGNOSIS — I63412 Cerebral infarction due to embolism of left middle cerebral artery: Secondary | ICD-10-CM | POA: Diagnosis present

## 2018-10-03 DIAGNOSIS — Z8619 Personal history of other infectious and parasitic diseases: Secondary | ICD-10-CM

## 2018-10-03 DIAGNOSIS — I959 Hypotension, unspecified: Secondary | ICD-10-CM | POA: Diagnosis not present

## 2018-10-03 DIAGNOSIS — I482 Chronic atrial fibrillation, unspecified: Secondary | ICD-10-CM | POA: Diagnosis not present

## 2018-10-03 DIAGNOSIS — E1165 Type 2 diabetes mellitus with hyperglycemia: Secondary | ICD-10-CM | POA: Diagnosis present

## 2018-10-03 DIAGNOSIS — Z981 Arthrodesis status: Secondary | ICD-10-CM

## 2018-10-03 DIAGNOSIS — R4701 Aphasia: Secondary | ICD-10-CM | POA: Diagnosis present

## 2018-10-03 DIAGNOSIS — I6523 Occlusion and stenosis of bilateral carotid arteries: Secondary | ICD-10-CM | POA: Diagnosis not present

## 2018-10-03 DIAGNOSIS — F419 Anxiety disorder, unspecified: Secondary | ICD-10-CM

## 2018-10-03 DIAGNOSIS — Z79890 Hormone replacement therapy (postmenopausal): Secondary | ICD-10-CM | POA: Diagnosis not present

## 2018-10-03 DIAGNOSIS — R29810 Facial weakness: Secondary | ICD-10-CM | POA: Diagnosis not present

## 2018-10-03 DIAGNOSIS — I4891 Unspecified atrial fibrillation: Secondary | ICD-10-CM | POA: Diagnosis present

## 2018-10-03 DIAGNOSIS — M4800 Spinal stenosis, site unspecified: Secondary | ICD-10-CM | POA: Diagnosis present

## 2018-10-03 DIAGNOSIS — R29715 NIHSS score 15: Secondary | ICD-10-CM | POA: Diagnosis present

## 2018-10-03 DIAGNOSIS — E039 Hypothyroidism, unspecified: Secondary | ICD-10-CM

## 2018-10-03 DIAGNOSIS — I771 Stricture of artery: Secondary | ICD-10-CM | POA: Diagnosis not present

## 2018-10-03 DIAGNOSIS — I69398 Other sequelae of cerebral infarction: Secondary | ICD-10-CM | POA: Diagnosis not present

## 2018-10-03 DIAGNOSIS — I69351 Hemiplegia and hemiparesis following cerebral infarction affecting right dominant side: Secondary | ICD-10-CM

## 2018-10-03 DIAGNOSIS — I69320 Aphasia following cerebral infarction: Secondary | ICD-10-CM | POA: Diagnosis not present

## 2018-10-03 DIAGNOSIS — Z8673 Personal history of transient ischemic attack (TIA), and cerebral infarction without residual deficits: Secondary | ICD-10-CM | POA: Diagnosis not present

## 2018-10-03 DIAGNOSIS — I361 Nonrheumatic tricuspid (valve) insufficiency: Secondary | ICD-10-CM | POA: Diagnosis not present

## 2018-10-03 DIAGNOSIS — I351 Nonrheumatic aortic (valve) insufficiency: Secondary | ICD-10-CM | POA: Diagnosis not present

## 2018-10-03 DIAGNOSIS — I10 Essential (primary) hypertension: Secondary | ICD-10-CM | POA: Diagnosis not present

## 2018-10-03 DIAGNOSIS — I63512 Cerebral infarction due to unspecified occlusion or stenosis of left middle cerebral artery: Secondary | ICD-10-CM | POA: Diagnosis not present

## 2018-10-03 DIAGNOSIS — Z79891 Long term (current) use of opiate analgesic: Secondary | ICD-10-CM | POA: Diagnosis not present

## 2018-10-03 DIAGNOSIS — D72829 Elevated white blood cell count, unspecified: Secondary | ICD-10-CM | POA: Diagnosis not present

## 2018-10-03 DIAGNOSIS — R402242 Coma scale, best verbal response, confused conversation, at arrival to emergency department: Secondary | ICD-10-CM | POA: Diagnosis present

## 2018-10-03 HISTORY — PX: IR PERCUTANEOUS ART THROMBECTOMY/INFUSION INTRACRANIAL INC DIAG ANGIO: IMG6087

## 2018-10-03 HISTORY — PX: RADIOLOGY WITH ANESTHESIA: SHX6223

## 2018-10-03 LAB — I-STAT CHEM 8, ED
BUN: 36 mg/dL — ABNORMAL HIGH (ref 8–23)
Calcium, Ion: 1.11 mmol/L — ABNORMAL LOW (ref 1.15–1.40)
Chloride: 102 mmol/L (ref 98–111)
Creatinine, Ser: 1.6 mg/dL — ABNORMAL HIGH (ref 0.44–1.00)
Glucose, Bld: 161 mg/dL — ABNORMAL HIGH (ref 70–99)
HCT: 43 % (ref 36.0–46.0)
Hemoglobin: 14.6 g/dL (ref 12.0–15.0)
Potassium: 4.7 mmol/L (ref 3.5–5.1)
Sodium: 135 mmol/L (ref 135–145)
TCO2: 27 mmol/L (ref 22–32)

## 2018-10-03 LAB — CBC
HCT: 43.5 % (ref 36.0–46.0)
Hemoglobin: 13.7 g/dL (ref 12.0–15.0)
MCH: 31.1 pg (ref 26.0–34.0)
MCHC: 31.5 g/dL (ref 30.0–36.0)
MCV: 98.9 fL (ref 80.0–100.0)
Platelets: 300 10*3/uL (ref 150–400)
RBC: 4.4 MIL/uL (ref 3.87–5.11)
RDW: 12.1 % (ref 11.5–15.5)
WBC: 10.9 10*3/uL — ABNORMAL HIGH (ref 4.0–10.5)
nRBC: 0 % (ref 0.0–0.2)

## 2018-10-03 LAB — PROTIME-INR
INR: 2.52
Prothrombin Time: 26.9 seconds — ABNORMAL HIGH (ref 11.4–15.2)

## 2018-10-03 LAB — COMPREHENSIVE METABOLIC PANEL
ALT: 16 U/L (ref 0–44)
AST: 22 U/L (ref 15–41)
Albumin: 3.2 g/dL — ABNORMAL LOW (ref 3.5–5.0)
Alkaline Phosphatase: 52 U/L (ref 38–126)
Anion gap: 9 (ref 5–15)
BUN: 34 mg/dL — ABNORMAL HIGH (ref 8–23)
CO2: 25 mmol/L (ref 22–32)
Calcium: 8.8 mg/dL — ABNORMAL LOW (ref 8.9–10.3)
Chloride: 101 mmol/L (ref 98–111)
Creatinine, Ser: 1.79 mg/dL — ABNORMAL HIGH (ref 0.44–1.00)
GFR calc Af Amer: 29 mL/min — ABNORMAL LOW (ref >60)
GFR calc non Af Amer: 25 mL/min — ABNORMAL LOW (ref >60)
Glucose, Bld: 163 mg/dL — ABNORMAL HIGH (ref 70–99)
Potassium: 4.7 mmol/L (ref 3.5–5.1)
Sodium: 135 mmol/L (ref 135–145)
Total Bilirubin: 0.6 mg/dL (ref 0.3–1.2)
Total Protein: 6.4 g/dL — ABNORMAL LOW (ref 6.5–8.1)

## 2018-10-03 LAB — DIFFERENTIAL
Abs Immature Granulocytes: 0.09 10*3/uL — ABNORMAL HIGH (ref 0.00–0.07)
Basophils Absolute: 0 10*3/uL (ref 0.0–0.1)
Basophils Relative: 0 %
Eosinophils Absolute: 0.1 10*3/uL (ref 0.0–0.5)
Eosinophils Relative: 1 %
Immature Granulocytes: 1 %
Lymphocytes Relative: 19 %
Lymphs Abs: 2.1 10*3/uL (ref 0.7–4.0)
Monocytes Absolute: 1.2 10*3/uL — ABNORMAL HIGH (ref 0.1–1.0)
Monocytes Relative: 11 %
Neutro Abs: 7.5 10*3/uL (ref 1.7–7.7)
Neutrophils Relative %: 68 %

## 2018-10-03 LAB — I-STAT TROPONIN, ED: Troponin i, poc: 0.06 ng/mL (ref 0.00–0.08)

## 2018-10-03 LAB — MRSA PCR SCREENING: MRSA by PCR: NEGATIVE

## 2018-10-03 LAB — CBG MONITORING, ED: Glucose-Capillary: 153 mg/dL — ABNORMAL HIGH (ref 70–99)

## 2018-10-03 LAB — APTT: aPTT: 35 seconds (ref 24–36)

## 2018-10-03 SURGERY — IR WITH ANESTHESIA
Anesthesia: General

## 2018-10-03 MED ORDER — FENTANYL CITRATE (PF) 100 MCG/2ML IJ SOLN
INTRAMUSCULAR | Status: AC
  Filled 2018-10-03: qty 2

## 2018-10-03 MED ORDER — LIDOCAINE HCL 1 % IJ SOLN
INTRAMUSCULAR | Status: AC
  Filled 2018-10-03: qty 20

## 2018-10-03 MED ORDER — ASPIRIN 325 MG PO TABS
ORAL_TABLET | ORAL | Status: AC
  Filled 2018-10-03: qty 1

## 2018-10-03 MED ORDER — SODIUM CHLORIDE 0.9 % IV SOLN
INTRAVENOUS | Status: DC | PRN
  Administered 2018-10-03: 30 ug/min via INTRAVENOUS

## 2018-10-03 MED ORDER — SODIUM CHLORIDE 0.9 % IV SOLN
INTRAVENOUS | Status: DC
  Administered 2018-10-04: 09:00:00 via INTRAVENOUS

## 2018-10-03 MED ORDER — LACTATED RINGERS IV SOLN
INTRAVENOUS | Status: DC | PRN
  Administered 2018-10-03: 17:00:00 via INTRAVENOUS

## 2018-10-03 MED ORDER — TICAGRELOR 90 MG PO TABS
ORAL_TABLET | ORAL | Status: AC
  Filled 2018-10-03: qty 2

## 2018-10-03 MED ORDER — ACETAMINOPHEN 160 MG/5ML PO SOLN: 650.0000 mg | ORAL | Status: DC | PRN

## 2018-10-03 MED ORDER — FENTANYL CITRATE (PF) 100 MCG/2ML IJ SOLN
INTRAMUSCULAR | Status: DC | PRN
  Administered 2018-10-03: 25 ug via INTRAVENOUS

## 2018-10-03 MED ORDER — EPTIFIBATIDE 20 MG/10ML IV SOLN
INTRAVENOUS | Status: AC
  Filled 2018-10-03: qty 10

## 2018-10-03 MED ORDER — ACETAMINOPHEN 650 MG RE SUPP: 650.0000 mg | RECTAL | Status: DC | PRN

## 2018-10-03 MED ORDER — CEFAZOLIN SODIUM-DEXTROSE 2-4 GM/100ML-% IV SOLN
INTRAVENOUS | Status: AC
  Filled 2018-10-03: qty 100

## 2018-10-03 MED ORDER — TIROFIBAN HCL IN NACL 5-0.9 MG/100ML-% IV SOLN
INTRAVENOUS | Status: AC
  Filled 2018-10-03: qty 100

## 2018-10-03 MED ORDER — POTASSIUM CHLORIDE CRYS ER 10 MEQ PO TBCR
10.0000 meq | EXTENDED_RELEASE_TABLET | Freq: Every day | ORAL | Status: DC
  Administered 2018-10-05 – 2018-10-06 (×2): 10 meq via ORAL
  Filled 2018-10-03 (×2): qty 1

## 2018-10-03 MED ORDER — LEVOTHYROXINE SODIUM 25 MCG PO TABS
25.0000 ug | ORAL_TABLET | Freq: Every day | ORAL | Status: DC
  Administered 2018-10-05 – 2018-10-06 (×2): 25 ug via ORAL
  Filled 2018-10-03 (×3): qty 1

## 2018-10-03 MED ORDER — ESMOLOL HCL 100 MG/10ML IV SOLN
INTRAVENOUS | Status: DC | PRN
  Administered 2018-10-03: 20 mg via INTRAVENOUS
  Administered 2018-10-03: 30 mg via INTRAVENOUS

## 2018-10-03 MED ORDER — ASPIRIN 81 MG PO CHEW
CHEWABLE_TABLET | ORAL | Status: AC
  Filled 2018-10-03: qty 1

## 2018-10-03 MED ORDER — CLOPIDOGREL BISULFATE 300 MG PO TABS
ORAL_TABLET | ORAL | Status: AC
  Filled 2018-10-03: qty 1

## 2018-10-03 MED ORDER — STROKE: EARLY STAGES OF RECOVERY BOOK
Freq: Once | Status: DC
  Filled 2018-10-03: qty 1

## 2018-10-03 MED ORDER — NITROGLYCERIN 1 MG/10 ML FOR IR/CATH LAB
INTRA_ARTERIAL | Status: AC
  Filled 2018-10-03: qty 10

## 2018-10-03 MED ORDER — PROPOFOL 10 MG/ML IV BOLUS
INTRAVENOUS | Status: DC | PRN
  Administered 2018-10-03: 90 mg via INTRAVENOUS

## 2018-10-03 MED ORDER — LIDOCAINE 2% (20 MG/ML) 5 ML SYRINGE
INTRAMUSCULAR | Status: DC | PRN
  Administered 2018-10-03: 40 mg via INTRAVENOUS

## 2018-10-03 MED ORDER — PHENYLEPHRINE 40 MCG/ML (10ML) SYRINGE FOR IV PUSH (FOR BLOOD PRESSURE SUPPORT)
PREFILLED_SYRINGE | INTRAVENOUS | Status: DC | PRN
  Administered 2018-10-03: 120 ug via INTRAVENOUS
  Administered 2018-10-03: 80 ug via INTRAVENOUS
  Administered 2018-10-03: 40 ug via INTRAVENOUS
  Administered 2018-10-03: 120 ug via INTRAVENOUS
  Administered 2018-10-03: 80 ug via INTRAVENOUS

## 2018-10-03 MED ORDER — CLEVIDIPINE BUTYRATE 0.5 MG/ML IV EMUL
0.0000 mg/h | INTRAVENOUS | Status: AC
  Administered 2018-10-03: 2 mg/h via INTRAVENOUS
  Filled 2018-10-03: qty 50

## 2018-10-03 MED ORDER — IOPAMIDOL (ISOVUE-370) INJECTION 76%
50.0000 mL | Freq: Once | INTRAVENOUS | Status: AC | PRN
  Administered 2018-10-03: 50 mL via INTRAVENOUS

## 2018-10-03 MED ORDER — ATORVASTATIN CALCIUM 10 MG PO TABS
10.0000 mg | ORAL_TABLET | Freq: Every day | ORAL | Status: DC
  Administered 2018-10-04 – 2018-10-05 (×2): 10 mg via ORAL
  Filled 2018-10-03 (×2): qty 1

## 2018-10-03 MED ORDER — SENNOSIDES-DOCUSATE SODIUM 8.6-50 MG PO TABS: 1.0000 | ORAL_TABLET | Freq: Every evening | ORAL | Status: DC | PRN

## 2018-10-03 MED ORDER — ROCURONIUM BROMIDE 10 MG/ML (PF) SYRINGE
PREFILLED_SYRINGE | INTRAVENOUS | Status: DC | PRN
  Administered 2018-10-03: 10 mg via INTRAVENOUS
  Administered 2018-10-03: 40 mg via INTRAVENOUS

## 2018-10-03 MED ORDER — CEFAZOLIN SODIUM-DEXTROSE 2-3 GM-%(50ML) IV SOLR
INTRAVENOUS | Status: DC | PRN
  Administered 2018-10-03: 2 g via INTRAVENOUS

## 2018-10-03 MED ORDER — SUCCINYLCHOLINE CHLORIDE 20 MG/ML IJ SOLN
INTRAMUSCULAR | Status: DC | PRN
  Administered 2018-10-03: 100 mg via INTRAVENOUS

## 2018-10-03 MED ORDER — ACETAMINOPHEN 325 MG PO TABS: 650.0000 mg | ORAL_TABLET | ORAL | Status: DC | PRN

## 2018-10-03 MED ORDER — SODIUM CHLORIDE 0.9 % IV SOLN
INTRAVENOUS | Status: DC
  Administered 2018-10-03 – 2018-10-05 (×2): via INTRAVENOUS

## 2018-10-03 MED ORDER — FUROSEMIDE 20 MG PO TABS: 10.0000 mg | ORAL_TABLET | ORAL | Status: DC | PRN

## 2018-10-03 NOTE — Progress Notes (Signed)
Called to get update on pt for her MRI and RN said it was not a good time to send for pt at this time.  RN to call when ready.

## 2018-10-03 NOTE — Transfer of Care (Signed)
Immediate Anesthesia Transfer of Care Note  Patient: [name]  Procedure(s) Performed: IR WITH ANESTHESIA (N/A )  Patient Location: PACU  Anesthesia Type:General  Level of Consciousness: awake and alert   Airway & Oxygen Therapy: Patient Spontanous Breathing  Post-op Assessment: Report given to RN and Post -op Vital signs reviewed and stable  Post vital signs: Reviewed and stable  Last Vitals:  Vitals Value Taken Time  BP 116/88 10/03/2018  6:43 PM  Temp 36.5 C 10/03/2018  6:37 PM  Pulse 78 10/03/2018  6:37 PM  Resp 29 10/03/2018  6:39 PM  SpO2 100 % 10/03/2018  6:37 PM  Vitals shown include unvalidated device data.  Last Pain: There were no vitals filed for this visit.       Complications: No apparent anesthesia complications

## 2018-10-03 NOTE — Progress Notes (Signed)
Bedside report given to DenmarkJena and Ladona Ridgelaylor, PACU RN.

## 2018-10-03 NOTE — Anesthesia Preprocedure Evaluation (Addendum)
Anesthesia Evaluation  Patient identified by MRN, date of birth, ID band Patient awake    Reviewed: Allergy & Precautions, NPO status , Patient's Chart, lab work & pertinent test results, reviewed documented beta blocker date and time   Airway Mallampati: II       Dental no notable dental hx. (+) Teeth Intact   Pulmonary former smoker,    breath sounds clear to auscultation       Cardiovascular hypertension, Pt. on medications and Pt. on home beta blockers + Peripheral Vascular Disease  + dysrhythmias Atrial Fibrillation  Rhythm:Irregular Rate:Normal  Carotid stenosis   Neuro/Psych Code Stroke CVA negative psych ROS   GI/Hepatic negative GI ROS, Neg liver ROS,   Endo/Other  diabetes, Well Controlled, Type 2Hypothyroidism Hyperlipidemia  Renal/GU negative Renal ROS  negative genitourinary   Musculoskeletal Osteoporosis   Abdominal   Peds  Hematology Coumadin therapy- last dose   Anesthesia Other Findings   Reproductive/Obstetrics                           Anesthesia Physical Anesthesia Plan  ASA: III and emergent  Anesthesia Plan: General   Post-op Pain Management:    Induction: Intravenous and Rapid sequence  PONV Risk Score and Plan: 3 and Ondansetron and Treatment may vary due to age or medical condition  Airway Management Planned: Oral ETT  Additional Equipment: Arterial line  Intra-op Plan:   Post-operative Plan: Extubation in OR  Informed Consent: I have reviewed the patients History and Physical, chart, labs and discussed the procedure including the risks, benefits and alternatives for the proposed anesthesia with the patient or authorized representative who has indicated his/her understanding and acceptance.   Dental advisory given  Plan Discussed with: CRNA and Surgeon  Anesthesia Plan Comments:        Anesthesia Quick Evaluation

## 2018-10-03 NOTE — Anesthesia Postprocedure Evaluation (Signed)
Anesthesia Post Note  Patient: Kasandra KnudsenCarolyn W White Reichard  Procedure(s) Performed: IR WITH ANESTHESIA (N/A )     Patient location during evaluation: PACU Anesthesia Type: General Level of consciousness: lethargic, awake and patient cooperative Pain management: pain level controlled Vital Signs Assessment: post-procedure vital signs reviewed and stable Respiratory status: spontaneous breathing, nonlabored ventilation, respiratory function stable and patient connected to nasal cannula oxygen Cardiovascular status: blood pressure returned to baseline and stable Postop Assessment: no apparent nausea or vomiting Anesthetic complications: no Comments: Following commands. Persistent R. Hemiparesis speaking single words    Last Vitals:  Vitals:   10/03/18 2100 10/03/18 2130  BP: 116/82 134/90  Pulse: (!) 107 90  Resp: (!) 23 (!) 24  Temp:    SpO2: 96% 97%    Last Pain:  Vitals:   10/03/18 1945  TempSrc: Axillary    LLE Motor Response: Purposeful movement (10/03/18 2130)   RLE Motor Response: Purposeful movement (10/03/18 2130)        Addalyn Speedy COKER

## 2018-10-03 NOTE — ED Provider Notes (Signed)
[facility] EMERGENCY DEPARTMENT Provider Note   CSN: [id number] Arrival date & time: 10/03/18  1553     History   Chief Complaint Chief Complaint  Patient presents with  . Code Stroke    HPI [name] [name] is a 84 y.o. female.  84 yo F with a chief complaints of sudden onset confusion had progressive right-sided weakness in route in the ambulance.  Arrived as a code stroke.  Level 5 caveat acuity of condition.  The history is provided by the patient and the EMS personnel.  Illness  This is a new problem. The current episode started 1 to 2 hours ago. The problem occurs constantly. The problem has been gradually worsening. Pertinent negatives include no chest pain, no headaches and no shortness of breath. Nothing aggravates the symptoms. Nothing relieves the symptoms. She has tried nothing for the symptoms. The treatment provided no relief.    Past Medical History:  Diagnosis Date  . Atrial fibrillation (HCC)   . Chronic anticoagulation   . Chronic anticoagulation   . CVA (cerebrovascular accident) (HCC)   . Diabetes mellitus   . History of chicken pox   . Hyperlipidemia   . Hypertension     Patient Active Problem List   Diagnosis Date Noted  . Stroke (HCC) 10/03/2018  . Frequent falls 10/13/2017  . Vitamin D deficiency 04/22/2017  . Slurring of speech 03/31/2017  . Back pain 09/14/2016  . Compression fracture of thoracic vertebra (HCC) 09/14/2016  . S/P vertebroplasty 09/14/2016  . Chronic diastolic heart failure (HCC) 09/14/2016  . HTN (hypertension) 09/14/2016  . History of CVA (cerebrovascular accident) 09/14/2016  . Hypokalemia 09/11/2016  . Paraspinal hematoma 09/11/2016  . Osteoporosis 05/27/2016  . Hypothyroidism 09/11/2014  . Encounter for therapeutic drug monitoring 11/28/2013  . Persistent atrial fibrillation (HCC) 12/15/2012  . HLD (hyperlipidemia) 03/27/2011    Past Surgical History:  Procedure Laterality Date  .  APPENDECTOMY    . BACK SURGERY    . CARDIAC CATHETERIZATION  11/09/91   EF 70%  . DILATION AND CURETTAGE OF UTERUS       OB History   No obstetric history on file.      Home Medications    Prior to Admission medications   Medication Sig Start Date End Date Taking? Authorizing Provider  atorvastatin (LIPITOR) 20 MG tablet TAKE 1/2 (ONE-HALF) TABLET BY MOUTH ONCE DAILY IN THE MORNING 08/29/18   Chilton Si, MD  diazepam (VALIUM) 2 MG tablet TAKE 1 TABLET BY MOUTH TWICE DAILY AS NEEDED FOR ANXIETY 03/08/18   Sheliah Hatch, MD  furosemide (LASIX) 20 MG tablet Take 10 mg by mouth as needed for fluid or edema.    [provider]  levothyroxine (SYNTHROID, LEVOTHROID) 25 MCG tablet TAKE 1 TABLET BY MOUTH ONCE DAILY BEFORE  BREAKFAST 07/11/18   Sheliah Hatch, MD  nadolol (CORGARD) 20 MG tablet TAKE 1/2 (ONE-HALF) TABLET BY MOUTH ONCE DAILY 07/26/18   Chilton Si, MD  nitroGLYCERIN (NITROSTAT) 0.4 MG SL tablet Place 0.4 mg under the tongue every 5 (five) minutes as needed (chest pain).     [provider]  oxyCODONE-acetaminophen (PERCOCET/ROXICET) 5-325 MG tablet Take 1 tablet by mouth every 4 (four) hours as needed for severe pain.     [provider]  potassium chloride (K-DUR,KLOR-CON) 10 MEQ tablet TAKE 1 TABLET BY MOUTH ONCE DAILY 06/21/18   Chilton Si, MD  warfarin (COUMADIN) 5 MG tablet TAKE 1/2 TO 1 (ONE-HALF TO  ONE) TABLET BY MOUTH ONCE DAILY AS DIRECTED BY  COUMADIN  CLINIC 07/20/18   Chilton Si, MD    Family History Family History  Problem Relation Age of Onset  . Heart disease Mother   . Hypertension Mother   . Stroke Mother   . Heart disease Father   . Heart failure Brother   . Osteoporosis Neg Hx     Social History Social History   Tobacco Use  . Smoking status: Former Smoker    Last attempt to quit: 10/19/1998    Years since quitting: 19.9  . Smokeless tobacco: Never Used  Substance Use Topics  . Alcohol  use: No  . Drug use: No     Allergies   Cozaar; Hydralazine; Hyzaar [losartan potassium-hctz]; Sulfa drugs cross reactors; and Lisinopril   Review of Systems Review of Systems  Constitutional: Negative for chills and fever.  HENT: Negative for congestion and rhinorrhea.   Eyes: Negative for redness and visual disturbance.  Respiratory: Negative for shortness of breath and wheezing.   Cardiovascular: Negative for chest pain and palpitations.  Gastrointestinal: Negative for nausea and vomiting.  Genitourinary: Negative for dysuria and urgency.  Musculoskeletal: Negative for arthralgias and myalgias.  Skin: Negative for pallor and wound.  Neurological: Positive for speech difficulty and weakness. Negative for dizziness and headaches.     Physical Exam Updated Vital Signs BP 116/88 (BP Location: Right Arm)   Pulse 78   Temp 97.7 F (36.5 C)   Resp 20   SpO2 100%   Physical Exam Vitals signs and nursing note reviewed.  Constitutional:      General: She is not in acute distress.    Appearance: She is well-developed. She is not diaphoretic.  HENT:     Head: Normocephalic and atraumatic.  Eyes:     Pupils: Pupils are equal, round, and reactive to light.  Neck:     Musculoskeletal: Normal range of motion and neck supple.  Cardiovascular:     Rate and Rhythm: Normal rate and regular rhythm.     Heart sounds: No murmur. No friction rub. No gallop.   Pulmonary:     Effort: Pulmonary effort is normal.     Breath sounds: No wheezing or rales.  Abdominal:     General: There is no distension.     Palpations: Abdomen is soft.     Tenderness: There is no abdominal tenderness.  Musculoskeletal:        General: No tenderness.  Skin:    General: Skin is warm and dry.  Neurological:     Mental Status: She is alert.     Comments: Head turned to left with eye.  Flacid right side.  Slurred speech  Psychiatric:        Behavior: Behavior normal.      ED Treatments / Results    Labs (all labs ordered are listed, but only abnormal results are displayed) Labs Reviewed  PROTIME-INR - Abnormal; Notable for the following components:      Result Value   Prothrombin Time 26.9 (*)    All other components within normal limits  CBC - Abnormal; Notable for the following components:   WBC 10.9 (*)    All other components within normal limits  DIFFERENTIAL - Abnormal; Notable for the following components:   Monocytes Absolute 1.2 (*)    Abs Immature Granulocytes 0.09 (*)    All other components within normal limits  COMPREHENSIVE METABOLIC PANEL - Abnormal; Notable for the following components:  Glucose, Bld 163 (*)    BUN 34 (*)    Creatinine, Ser 1.79 (*)    Calcium 8.8 (*)    Total Protein 6.4 (*)    Albumin 3.2 (*)    GFR calc non Af Amer 25 (*)    GFR calc Af Amer 29 (*)    All other components within normal limits  CBG MONITORING, ED - Abnormal; Notable for the following components:   Glucose-Capillary 153 (*)    All other components within normal limits  I-STAT CHEM 8, ED - Abnormal; Notable for the following components:   BUN 36 (*)    Creatinine, Ser 1.60 (*)    Glucose, Bld 161 (*)    Calcium, Ion 1.11 (*)    All other components within normal limits  APTT  HEMOGLOBIN A1C  LIPID PANEL  I-STAT TROPONIN, ED    EKG None  Radiology Ct Angio Head W Or Wo Contrast  Result Date: 10/03/2018 CLINICAL DATA:  Focal neuro deficit with stroke suspected-deficit is not specified in the history or charted. EXAM: CT ANGIOGRAPHY HEAD AND NECK TECHNIQUE: Multidetector CT imaging of the head and neck was performed using the standard protocol during bolus administration of intravenous contrast. Multiplanar CT image reconstructions and MIPs were obtained to evaluate the vascular anatomy. Carotid stenosis measurements (when applicable) are obtained utilizing NASCET criteria, using the distal internal carotid diameter as the denominator. CONTRAST:  50mL ISOVUE-370  IOPAMIDOL (ISOVUE-370) INJECTION 76% COMPARISON:  Head CT from earlier today. FINDINGS: CTA NECK FINDINGS Aortic arch: The arch and great vessel origins are not covered. Right carotid system: Moderate calcified plaque about the common carotid bifurcation without ulceration or flow limiting stenosis. Left carotid system: Bulky calcified plaque at the ICA bulb with no flow limiting stenosis or ulceration. No ICA beading. Vertebral arteries: The subclavian origins are not covered. There is subclavian atherosclerosis with 50% narrowing on the left. Mild dominance of the right vertebral artery. The vertebral arteries are tortuous without beading or flow limiting stenosis. Skeleton: Usual degenerative changes. No acute or aggressive finding. Other neck: No evident mass or inflammation. Upper chest: No acute finding Review of the MIP images confirms the above findings CTA HEAD FINDINGS Anterior circulation: Left M2 branch occlusion with paucity of downstream vessels. Calcified plaque on the carotid siphons. No proximal flow limiting stenosis. Negative for aneurysm. Posterior circulation: Vertebrobasilar arteries are smooth and widely patent. Hypoplastic left P1 and aplastic right P1 segments. No branch occlusion or flow limiting stenosis. Venous sinuses: Negative Anatomic variants: As above Delayed phase: Not obtained in the emergent setting Review of the MIP images confirms the above findings Critical Value/emergent results were called by telephone at the time of interpretation on 10/03/2018 at 4:25 pm to Dr. Felicie Morn , who verbally acknowledged these results. IMPRESSION: 1. Left M2 branch occlusion. 2. Atherosclerosis but no high-grade stenosis or embolic source identified in the neck. 3. 50% narrowing of the proximal left subclavian. 4. The arch and great vessel ostia were not covered in the field of view. Electronically Signed   By: Marnee Spring M.D.   On: 10/03/2018 16:28   Ct Angio Neck W Or Wo  Contrast  Result Date: 10/03/2018 CLINICAL DATA:  Focal neuro deficit with stroke suspected-deficit is not specified in the history or charted. EXAM: CT ANGIOGRAPHY HEAD AND NECK TECHNIQUE: Multidetector CT imaging of the head and neck was performed using the standard protocol during bolus administration of intravenous contrast. Multiplanar CT image reconstructions and MIPs were  obtained to evaluate the vascular anatomy. Carotid stenosis measurements (when applicable) are obtained utilizing NASCET criteria, using the distal internal carotid diameter as the denominator. CONTRAST:  50mL ISOVUE-370 IOPAMIDOL (ISOVUE-370) INJECTION 76% COMPARISON:  Head CT from earlier today. FINDINGS: CTA NECK FINDINGS Aortic arch: The arch and great vessel origins are not covered. Right carotid system: Moderate calcified plaque about the common carotid bifurcation without ulceration or flow limiting stenosis. Left carotid system: Bulky calcified plaque at the ICA bulb with no flow limiting stenosis or ulceration. No ICA beading. Vertebral arteries: The subclavian origins are not covered. There is subclavian atherosclerosis with 50% narrowing on the left. Mild dominance of the right vertebral artery. The vertebral arteries are tortuous without beading or flow limiting stenosis. Skeleton: Usual degenerative changes. No acute or aggressive finding. Other neck: No evident mass or inflammation. Upper chest: No acute finding Review of the MIP images confirms the above findings CTA HEAD FINDINGS Anterior circulation: Left M2 branch occlusion with paucity of downstream vessels. Calcified plaque on the carotid siphons. No proximal flow limiting stenosis. Negative for aneurysm. Posterior circulation: Vertebrobasilar arteries are smooth and widely patent. Hypoplastic left P1 and aplastic right P1 segments. No branch occlusion or flow limiting stenosis. Venous sinuses: Negative Anatomic variants: As above Delayed phase: Not obtained in the  emergent setting Review of the MIP images confirms the above findings Critical Value/emergent results were called by telephone at the time of interpretation on 10/03/2018 at 4:25 pm to Dr. Felicie Morn , who verbally acknowledged these results. IMPRESSION: 1. Left M2 branch occlusion. 2. Atherosclerosis but no high-grade stenosis or embolic source identified in the neck. 3. 50% narrowing of the proximal left subclavian. 4. The arch and great vessel ostia were not covered in the field of view. Electronically Signed   By: Marnee Spring M.D.   On: 10/03/2018 16:28   Ct Head Code Stroke Wo Contrast  Result Date: 10/03/2018 CLINICAL DATA:  Code stroke. 84 y/o F; Focal neuro deficit, < 6 hrs, stroke suspected. EXAM: CT HEAD WITHOUT CONTRAST TECHNIQUE: Contiguous axial images were obtained from the base of the skull through the vertex without intravenous contrast. COMPARISON:  09/21/2018 CT head FINDINGS: Brain: No evidence of acute infarction, hemorrhage, hydrocephalus, extra-axial collection or mass lesion/mass effect. Stable chronic infarctions within the right lateral cerebellar hemisphere. Stable white matter hypodensities compatible with chronic microvascular ischemic changes and stable volume loss of the brain. Stable small scattered hypodensities within the basal ganglia and left pons, likely represent chronic lacunar infarctions. Vascular: Calcific atherosclerosis of carotid siphons. No hyperdense vessel identified. Skull: Normal. Negative for fracture or focal lesion. Sinuses/Orbits: No acute finding. Other: None. ASPECTS [facility] Stroke Program Early CT Score) - Ganglionic level infarction (caudate, lentiform nuclei, internal capsule, insula, M1-M3 cortex): 7 - Supraganglionic infarction (M4-M6 cortex): 3 Total score (0-10 with 10 being normal): 10 IMPRESSION: 1. No acute intracranial abnormality identified. 2. ASPECTS is 10. 3. Stable chronic microvascular ischemic changes and volume loss of the brain.  Stable small chronic infarctions in right lateral cerebellar hemisphere, left pons, and bilateral basal ganglia. These results were communicated to Dr. Laurence Slate at 4:11 pmon 12/16/2019by text page via the [facility] messaging system. Electronically Signed   By: Mitzi Hansen M.D.   On: 10/03/2018 16:12    Procedures Procedures (including critical care time)  Medications Ordered in ED Medications  nitroGLYCERIN 100 mcg/mL intra-arterial injection (has no administration in time range)  nitroGLYCERIN 100 mcg/mL intra-arterial injection (has no administration in time range)  stroke: mapping our early stages of recovery book (has no administration in time range)  0.9 %  sodium chloride infusion (has no administration in time range)  senna-docusate (Senokot-S) tablet 1 tablet (has no administration in time range)  atorvastatin (LIPITOR) tablet 10 mg (has no administration in time range)  furosemide (LASIX) tablet 10 mg (has no administration in time range)  levothyroxine (SYNTHROID, LEVOTHROID) tablet 25 mcg (has no administration in time range)  potassium chloride (K-DUR,KLOR-CON) CR tablet 10 mEq (has no administration in time range)  ceFAZolin (ANCEF) 2-4 GM/100ML-% IVPB (has no administration in time range)  iopamidol (ISOVUE-370) 76 % injection 50 mL (50 mLs Intravenous Contrast Given 10/03/18 1612)  fentaNYL (SUBLIMAZE) 100 MCG/2ML injection (  Override pull for Anesthesia 10/03/18 1729)     Initial Impression / Assessment and Plan / ED Course  I have reviewed the triage vital signs and the nursing notes.  Pertinent labs & imaging results that were available during my care of the patient were reviewed by me and considered in my medical decision making (see chart for details).     84 yo F with a cc of acute confusion and weakness. Made a code stroke and taken urgently back to CT. CT angiogram of the head and neck with an M1 occlusion.  She was taken to IR suite for  thrombectomy.  CRITICAL CARE Performed by: Rae Roam   Total critical care time: 35 minutes  Critical care time was exclusive of separately billable procedures and treating other patients.  Critical care was necessary to treat or prevent imminent or life-threatening deterioration.  Critical care was time spent personally by me on the following activities: development of treatment plan with patient and/or surrogate as well as nursing, discussions with consultants, evaluation of patient's response to treatment, examination of patient, obtaining history from patient or surrogate, ordering and performing treatments and interventions, ordering and review of laboratory studies, ordering and review of radiographic studies, pulse oximetry and re-evaluation of patient's condition.  The patients results and plan were reviewed and discussed.   Any x-rays performed were independently reviewed by myself.   Differential diagnosis were considered with the presenting HPI.  Medications  nitroGLYCERIN 100 mcg/mL intra-arterial injection (has no administration in time range)  nitroGLYCERIN 100 mcg/mL intra-arterial injection (has no administration in time range)   stroke: mapping our early stages of recovery book (has no administration in time range)  0.9 %  sodium chloride infusion (has no administration in time range)  senna-docusate (Senokot-S) tablet 1 tablet (has no administration in time range)  atorvastatin (LIPITOR) tablet 10 mg (has no administration in time range)  furosemide (LASIX) tablet 10 mg (has no administration in time range)  levothyroxine (SYNTHROID, LEVOTHROID) tablet 25 mcg (has no administration in time range)  potassium chloride (K-DUR,KLOR-CON) CR tablet 10 mEq (has no administration in time range)  ceFAZolin (ANCEF) 2-4 GM/100ML-% IVPB (has no administration in time range)  iopamidol (ISOVUE-370) 76 % injection 50 mL (50 mLs Intravenous Contrast Given 10/03/18 1612)   fentaNYL (SUBLIMAZE) 100 MCG/2ML injection (  Override pull for Anesthesia 10/03/18 1729)    Vitals:   10/03/18 1615 10/03/18 1837 10/03/18 1843  BP: 103/66 (!) 171/101 116/88  Pulse: 60 78   Resp: 16 20   Temp:  97.7 F (36.5 C)   SpO2: 95% 100%     Final diagnoses:  Stroke [facility])    Admission/ observation were discussed with the admitting physician, patient and/or family and they are comfortable  with the plan.    Final Clinical Impressions(s) / ED Diagnoses   Final diagnoses:  Stroke [facility](HCC)    ED Discharge Orders    None       Melene PlanFloyd, Marietta Sikkema, DO 10/03/18 1853

## 2018-10-03 NOTE — H&P (Signed)
Neurology history and physical   CC: A phasic with right-sided flaccidity  History is obtained from: EMS  HPI: [name] is a 84 y.o. female of hypertension, hyperlipidemia, diabetes, CVA, chronic anticoagulation secondary to A. fib on imaging.  Patient was sitting in a chair next to her husband today when at 1440 he noted she suddenly went flaccid on her right and was not making any sense.  EMS was called.  On the way to the hospital she apparently became agitated but still was having difficulty expressing herself and understanding.  She was flaccid on the right.  Patient was immediately brought to the CT scanner where showed no subdural or intracranial bleed.  Patient remains flaccid on the right and a phasic.  In addition she is not moving her right-sided noxious stimuli.  Chart review (prior neurology notes/ discharge) none   LKW: 1440 on 10/03/2018 tpa given?: no, INR 2.5 Premorbid modified Rankin scale (mRS): 4 NIH stroke scale 15  ROS:  Unable to obtain due to altered mental status.   Past Medical History:  Diagnosis Date  . Atrial fibrillation (HCC)   . Chronic anticoagulation   . Chronic anticoagulation   . CVA (cerebrovascular accident) (HCC)   . Diabetes mellitus   . History of chicken pox   . Hyperlipidemia   . Hypertension      Family History  Problem Relation Age of Onset  . Heart disease Mother   . Hypertension Mother   . Stroke Mother   . Heart disease Father   . Heart failure Brother   . Osteoporosis Neg Hx    Social History:   reports that she quit smoking about 19 years ago. She has never used smokeless tobacco. She reports that she does not drink alcohol or use drugs.  Medications No current facility-administered medications for this encounter.   Current Outpatient Medications:  .  atorvastatin (LIPITOR) 20 MG tablet, TAKE 1/2 (ONE-HALF) TABLET BY MOUTH ONCE DAILY IN THE MORNING, Disp: 30 tablet, Rfl: 6 .  diazepam (VALIUM) 2 MG  tablet, TAKE 1 TABLET BY MOUTH TWICE DAILY AS NEEDED FOR ANXIETY, Disp: 90 tablet, Rfl: 0 .  furosemide (LASIX) 20 MG tablet, Take 10 mg by mouth as needed for fluid or edema., Disp: , Rfl:  .  levothyroxine (SYNTHROID, LEVOTHROID) 25 MCG tablet, TAKE 1 TABLET BY MOUTH ONCE DAILY BEFORE  BREAKFAST, Disp: 90 tablet, Rfl: 0 .  nadolol (CORGARD) 20 MG tablet, TAKE 1/2 (ONE-HALF) TABLET BY MOUTH ONCE DAILY, Disp: 45 tablet, Rfl: 1 .  nitroGLYCERIN (NITROSTAT) 0.4 MG SL tablet, Place 0.4 mg under the tongue every 5 (five) minutes as needed (chest pain). , Disp: , Rfl:  .  oxyCODONE-acetaminophen (PERCOCET/ROXICET) 5-325 MG tablet, Take 1 tablet by mouth every 4 (four) hours as needed for severe pain. , Disp: , Rfl:  .  potassium chloride (K-DUR,KLOR-CON) 10 MEQ tablet, TAKE 1 TABLET BY MOUTH ONCE DAILY, Disp: 90 tablet, Rfl: 1 .  warfarin (COUMADIN) 5 MG tablet, TAKE 1/2 TO 1 (ONE-HALF TO ONE) TABLET BY MOUTH ONCE DAILY AS DIRECTED BY  COUMADIN  CLINIC, Disp: 90 tablet, Rfl: 1   Exam: Current vital signs: There were no vitals taken for this visit. Vital signs in last 24 hours:    Physical Exam  Constitutional: Appears well-developed and well-nourished.  Psych: Affect appropriate to situation Eyes: No scleral injection HENT: No OP obstrucion Head: Normocephalic.  Cardiovascular: Normal rate and regular rhythm.  Respiratory: Effort normal, non-labored breathing  GI: Soft.  No distension. There is no tenderness.  Skin: WDI  Neuro: Mental Status: Patient is awake, alert, a phasic Patient is unable to give history Cranial Nerves: II: Visual Fields: Less blink to threat on the right side III,IV, VI: EOMI without ptosis or diploplia. Pupils are equal, round, and reactive to light.   VII: Right facial droo XII: tongue is midline Motor: Right arm is 2+ /5 and right leg 1/5/  Sensory: Right arm and leg shows no response to noxious stimuli Deep Tendon Reflexes: 2+ and symmetric in the biceps  and patellae. Plantars: Toes are downgoing bilaterally.  Cerebellar:   Labs I have reviewed labs in epic and the results pertinent to this consultation are:  CBC    Component Value Date/Time   WBC 7.4 05/11/2018 1454   WBC 6.5 10/13/2017 1658   RBC 4.66 05/11/2018 1454   RBC 4.37 10/13/2017 1658   HGB 14.6 05/11/2018 1454   HCT 44.3 05/11/2018 1454   PLT 230 05/11/2018 1454   MCV 95 05/11/2018 1454   MCH 31.3 05/11/2018 1454   MCH 32.0 10/13/2017 1658   MCHC 33.0 05/11/2018 1454   MCHC 33.9 10/13/2017 1658   RDW 12.1 (L) 05/11/2018 1454   LYMPHSABS 1.9 05/11/2018 1454   MONOABS 0.8 03/31/2017 1755   EOSABS 0.1 05/11/2018 1454   BASOSABS 0.0 05/11/2018 1454    CMP     Component Value Date/Time   NA 140 10/13/2017 1658   K 4.1 10/13/2017 1658   CL 104 10/13/2017 1658   CO2 27 10/13/2017 1658   GLUCOSE 118 (H) 10/13/2017 1658   BUN 17 10/13/2017 1658   CREATININE 0.83 10/13/2017 1658   CALCIUM 9.8 04/12/2018 1609   PROT 6.1 10/13/2017 1658   ALBUMIN 4.4 03/31/2017 1755   AST 16 10/13/2017 1658   ALT 7 10/13/2017 1658   ALKPHOS 56 03/31/2017 1755   BILITOT 0.8 10/13/2017 1658   GFRNONAA 39 (L) 03/31/2017 1755   GFRAA 45 (L) 03/31/2017 1755    Lipid Panel     Component Value Date/Time   CHOL 160 10/13/2017 1658   TRIG 95 10/13/2017 1658   HDL 64 10/13/2017 1658   CHOLHDL 2.5 10/13/2017 1658   VLDL 19.4 03/31/2017 1517   LDLCALC 78 10/13/2017 1658     Imaging I have reviewed the images obtained:  CT-scan of the brain- no acute infarct or intracranial hemorrhage.  CTA of head and neck did show a left M2 occlusion  Felicie Morn PA-C [facility] [phone]  M-F  (9:00 am- 5:00 PM)  10/03/2018, 4:06 PM    ASSESSMENT AND PLAN    Acute Ischemic Stroke with Left M2 occlusion s/p IR with no recanalization  Cerebral infarction due to embolism of left middle cerebral artery  Cerebral infarction due to embolism secondary to A.  fib  Acuity: Acute Current Suspected Etiology: Atrial fibrillation -Admit to: Neuro ICU -Continue Statin -Blood pressure control, goal of SYS <180 -MRI/ECHO/A1C/Lipid panel. -Hyperglycemia management per SSI to maintain glucose 140-180mg /dL. -PT/OT/ST therapies and recommendations when able  CNS  -Close neuro monitoring  Dysarthria Dysphagia following cerebral infarction  -NPO until cleared by speech -ST -May need PEG  Hemiplegia and hemiparesis following cerebral infarction affecting right dominant side -PT/OT  CV -Aggressive BP control, goal SBP <180 -Titrate oral agents   Hyperlipidemia, unspecified  - Statin for goal LDL < 70  Paroxysmal atrial fibrillation Chronic atrial fibrillation -Repeat CT in 2 weeks for consideration of starting anticoagulation if  CT is stable.  Coumadin with goal INR 2-3 and stop  antiplatelet once INR >2, provide Coumadin teaching.   Prophylaxis DVT: DVT  GI: Protonix Bowel: Senokot  Diet: NPO until cleared by speech  Code Status: Full Code     NEUROHOSPITALIST ADDENDUM Performed a face to face diagnostic evaluation.   I have reviewed the contents of history and physical exam as documented by PA/ARNP/Resident and agree with above documentation.  I have discussed and formulated the above plan as documented. Edits to the note have been made as needed.  84 year old female with paroxysmal A. fib on warfarin presents with acute onset right hemiplegia and aphasia.  There was slight improvement in CT scan.  Stat CT head showed no acute abnormality.  CT angiogram showed a M2 occlusion.  INR came back as 2.5 and therefore patient did not receive TPA.  Patient at baseline cognitively intact however requires assistance for daily activities such as going to the bathroom due to spinal stenosis.  Discussed with family regarding risk versus benefit and agreed to pursue IR  Patient underwent to  IR however there was no recanalization as the clot  moved distally.  Was admitted to neuro ICU and has been extubated.  We will hold warfarin.  Stroke work-up initiated.     This patient is neurologically critically ill due to left MCA stroke with M2 occlusion.   He is at risk for significant risk of neurological worsening from cerebral edema,  death from brain herniation, heart failure, hemorrhagic conversion, infection, respiratory failure and seizure. This patient's care requires constant monitoring of vital signs, hemodynamics, respiratory and cardiac monitoring, review of multiple databases, neurological assessment, discussion with family, other specialists and medical decision making of high complexity.  I spent 55  minutes of neurocritical time in the care of this patient.       Georgiana Spinner Aroor MD [facility] [phone]   If 7pm to 7am, please call on call as listed on AMION.

## 2018-10-03 NOTE — Progress Notes (Signed)
Pt intubated and under the care of anesthesia at this time. Unable to perform NIH

## 2018-10-03 NOTE — Code Documentation (Signed)
84 yo female coming from home where she lives with her husband. Pt has hx of Afib, HTN, and Potential TIA with no deficits. Remains on Wafarin and INR noted to be 2.5. Pt had a witnessed sudden onset of inability to speak and right sided weakness. Husband called EMS and EMS activated a Code Stroke. Stroke Team met patient upon arrival. Initial NIHSS 15 due to severe aphasia, right arm and leg weakness, decreased sensation, and dysarthria. CT Head completed with no signs of hemorrhage. Not a tPA candidate due to being on Wafarin. CTA completed and showed M1 occlusion. Pt taken to IR. Patient's right arm weakness decreasing. NIHSS 10 upon arrival to IR. Pt continues to be aphasic and unable to follow commands. Handoff given to IR RN and CRNA.

## 2018-10-03 NOTE — Procedures (Signed)
S/p Lt common carotid arteriogram  RT CFA approach. Findings. 1.occluded Lt MCA inf division distal M2 region. 2.Occluded ant perisylvian branches in the M3 M4 region. 3.Severe tortuosity of aortic arch and Lt CCA origin. S/P attempted endovascular treatment of occluded Lt MCA distal M2 branch,with severe tortuosity precluding  Delivery of devices despite multiple attempts.  Post extubation patient awake and opens eyes to command. Moves Lt A and L spontaneously. No sig movement noted in Rt A and L. Groin soft with no hematoma.56F groin sheath to arterial flush. Distal pulses dopplerable DPs and PTs. S.Sheriden Archibeque MD

## 2018-10-03 NOTE — Anesthesia Procedure Notes (Addendum)
Procedure Name: Intubation Performed by: Milford Cage, CRNA Pre-anesthesia Checklist: Patient identified, Emergency Drugs available, Suction available and Patient being monitored Patient Re-evaluated:Patient Re-evaluated prior to induction Oxygen Delivery Method: Circle System Utilized Preoxygenation: Pre-oxygenation with 100% oxygen Induction Type: IV induction, Cricoid Pressure applied and Rapid sequence Laryngoscope Size: Mac and 3 Grade View: Grade I Tube type: Oral Tube size: 7.0 mm Number of attempts: 1 Airway Equipment and Method: Stylet Placement Confirmation: ETT inserted through vocal cords under direct vision,  positive ETCO2 and breath sounds checked- equal and bilateral Secured at: 21 cm Tube secured with: Tape Dental Injury: Teeth and Oropharynx as per pre-operative assessment

## 2018-10-03 NOTE — Progress Notes (Signed)
Anesthesia present for case 

## 2018-10-03 NOTE — ED Triage Notes (Signed)
Per [facility]: Patient to ED from home with stroke symptoms - LKW 1447 - presents to ED with R sided deficits and confusion. Per EMS, patient became agitated en route - hitting at EMS. Patient reported to have fall some time last week, but was not evaluated. On coumadin.

## 2018-10-04 ENCOUNTER — Inpatient Hospital Stay (HOSPITAL_COMMUNITY): Payer: Medicare Other

## 2018-10-04 ENCOUNTER — Encounter (HOSPITAL_COMMUNITY): Payer: Self-pay | Admitting: Radiology

## 2018-10-04 DIAGNOSIS — D72829 Elevated white blood cell count, unspecified: Secondary | ICD-10-CM

## 2018-10-04 DIAGNOSIS — I351 Nonrheumatic aortic (valve) insufficiency: Secondary | ICD-10-CM

## 2018-10-04 DIAGNOSIS — I361 Nonrheumatic tricuspid (valve) insufficiency: Secondary | ICD-10-CM

## 2018-10-04 DIAGNOSIS — I63412 Cerebral infarction due to embolism of left middle cerebral artery: Principal | ICD-10-CM

## 2018-10-04 DIAGNOSIS — I4821 Permanent atrial fibrillation: Secondary | ICD-10-CM

## 2018-10-04 LAB — CBC
HCT: 37.7 % (ref 36.0–46.0)
Hemoglobin: 11.5 g/dL — ABNORMAL LOW (ref 12.0–15.0)
MCH: 30.6 pg (ref 26.0–34.0)
MCHC: 30.5 g/dL (ref 30.0–36.0)
MCV: 100.3 fL — ABNORMAL HIGH (ref 80.0–100.0)
Platelets: 285 10*3/uL (ref 150–400)
RBC: 3.76 MIL/uL — ABNORMAL LOW (ref 3.87–5.11)
RDW: 12.1 % (ref 11.5–15.5)
WBC: 14.2 10*3/uL — ABNORMAL HIGH (ref 4.0–10.5)
nRBC: 0 % (ref 0.0–0.2)

## 2018-10-04 LAB — BASIC METABOLIC PANEL
Anion gap: 11 (ref 5–15)
BUN: 21 mg/dL (ref 8–23)
CO2: 20 mmol/L — ABNORMAL LOW (ref 22–32)
Calcium: 7.7 mg/dL — ABNORMAL LOW (ref 8.9–10.3)
Chloride: 108 mmol/L (ref 98–111)
Creatinine, Ser: 1.51 mg/dL — ABNORMAL HIGH (ref 0.44–1.00)
GFR calc Af Amer: 36 mL/min — ABNORMAL LOW (ref >60)
GFR calc non Af Amer: 31 mL/min — ABNORMAL LOW (ref >60)
Glucose, Bld: 135 mg/dL — ABNORMAL HIGH (ref 70–99)
Potassium: 4.4 mmol/L (ref 3.5–5.1)
Sodium: 139 mmol/L (ref 135–145)

## 2018-10-04 LAB — GLUCOSE, CAPILLARY: Glucose-Capillary: 113 mg/dL — ABNORMAL HIGH (ref 70–99)

## 2018-10-04 LAB — LIPID PANEL
Cholesterol: 103 mg/dL (ref 0–200)
HDL: 26 mg/dL — ABNORMAL LOW (ref >40)
LDL Cholesterol: 59 mg/dL (ref 0–99)
Total CHOL/HDL Ratio: 4 RATIO
Triglycerides: 88 mg/dL (ref <150)
VLDL: 18 mg/dL (ref 0–40)

## 2018-10-04 LAB — TYPE AND SCREEN
ABO/RH(D): B POS
Antibody Screen: NEGATIVE

## 2018-10-04 LAB — HEMOGLOBIN A1C
Hgb A1c MFr Bld: 5.2 % (ref 4.8–5.6)
Mean Plasma Glucose: 102.54 mg/dL

## 2018-10-04 LAB — ABO/RH: ABO/RH(D): B POS

## 2018-10-04 LAB — PROTIME-INR
INR: 3.27
Prothrombin Time: 32.9 seconds — ABNORMAL HIGH (ref 11.4–15.2)

## 2018-10-04 LAB — ECHOCARDIOGRAM COMPLETE

## 2018-10-04 MED ORDER — PHENYLEPHRINE HCL-NACL 10-0.9 MG/250ML-% IV SOLN: 0.0000 ug/min | INTRAVENOUS | Status: DC

## 2018-10-04 MED ORDER — LORAZEPAM 2 MG/ML IJ SOLN
0.5000 mg | Freq: Once | INTRAMUSCULAR | Status: DC | PRN
  Filled 2018-10-04: qty 1

## 2018-10-04 MED ORDER — METOPROLOL TARTRATE 5 MG/5ML IV SOLN: 5.0000 mg | Freq: Four times a day (QID) | INTRAVENOUS | Status: DC | PRN

## 2018-10-04 MED ORDER — SODIUM CHLORIDE 0.9 % IV BOLUS
500.0000 mL | Freq: Once | INTRAVENOUS | Status: AC
  Administered 2018-10-04: 500 mL via INTRAVENOUS

## 2018-10-04 NOTE — Progress Notes (Signed)
Neuro IR I received a phone call from Dr. Wilford CornerArora that the patient had accidentally removed her Rt 8 french femoral sheath.  Upon entering the room the unit nurses were holding pressure.  A VPAD was used to achieve hemostasis.  I continued to hold pressure while the nursing team changed the linens.  Because of the necessary movement and rolling of the patient to change the linens a small hematoma developed.  More manual pressure was used to soften the hematoma.  After approximately 15 minutes of pressure, the site was soft with no residual hematoma.  [facility] and [place] [facility].  Pressure dressing applied. Elex Mainwaring RT-R [employer] CorporationBrandy Mullis RT-R

## 2018-10-04 NOTE — Progress Notes (Signed)
At approximately [phone] it was noted on the monitor that the patient's sheath wasn't transducing. Upon entering the room patient's groin site was heavily bleeding and that the sheath had been removed from the groin. Pressure was applied to the groin site for about 45 minutes and hemostasis was achieved. Dr. Corliss Skainseveshwar was notified. CCM was consulted and the IR team came to assess.

## 2018-10-04 NOTE — Progress Notes (Signed)
OT Cancellation Note  Patient Details Name: Pincus LargeCarolyn W White Reichard MRN: [id number] DOB: February 12, 1930   Cancelled Treatment:    Reason Eval/Treat Not Completed: Medical issues which prohibited therapy;Active bedrest order. Patient pulled out R groin sheath this morning and remains on bed rest.  OT will follow and initiate eval when appropriate and able.    Chancy Milroyhristie S Tijana Walder, OT Acute Rehabilitation Services Pager [phone] Office [phone]   Chancy MilroyChristie S Olyver Hawes 10/04/2018, 11:50 AM

## 2018-10-04 NOTE — Progress Notes (Signed)
  Echocardiogram 2D Echocardiogram has been performed.  [name], [name] 10/04/2018, 9:57 AM

## 2018-10-04 NOTE — Progress Notes (Signed)
Referring Physician(s): CODE STROKE- Aroor, Dara Lords  Supervising Physician: Julieanne Cotton  Patient Status:  [facility] - In-pt  Chief Complaint: None  Subjective:  Left MCA M2, M3, 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 Corliss Skains. Patient awake and alert laying in bed with no complaints at this time. Accompanied by daughter at bedside. Demonstrates expressive aphasia. Can spontaneously move all extremities. RN reports prior right groin incision bleed/hematoma- manual pressure held by Herbert Seta, RT-R and hemostasis achieved. Right groin incision now c/d/i.   Allergies: Cozaar; Hydralazine; Hyzaar [losartan potassium-hctz]; Sulfa drugs cross reactors; and Lisinopril  Medications: Prior to Admission medications   Medication Sig Start Date End Date Taking? Authorizing Provider  atorvastatin (LIPITOR) 20 MG tablet TAKE 1/2 (ONE-HALF) TABLET BY MOUTH ONCE DAILY IN THE MORNING 08/29/18   Chilton Si, MD  diazepam (VALIUM) 2 MG tablet TAKE 1 TABLET BY MOUTH TWICE DAILY AS NEEDED FOR ANXIETY 03/08/18   Sheliah Hatch, MD  furosemide (LASIX) 20 MG tablet Take 10 mg by mouth as needed for fluid or edema.    [provider]  levothyroxine (SYNTHROID, LEVOTHROID) 25 MCG tablet TAKE 1 TABLET BY MOUTH ONCE DAILY BEFORE  BREAKFAST 07/11/18   Sheliah Hatch, MD  nadolol (CORGARD) 20 MG tablet TAKE 1/2 (ONE-HALF) TABLET BY MOUTH ONCE DAILY 07/26/18   Chilton Si, MD  nitroGLYCERIN (NITROSTAT) 0.4 MG SL tablet Place 0.4 mg under the tongue every 5 (five) minutes as needed (chest pain).     [provider]  oxyCODONE-acetaminophen (PERCOCET/ROXICET) 5-325 MG tablet Take 1 tablet by mouth every 4 (four) hours as needed for severe pain.     [provider]  potassium chloride (K-DUR,KLOR-CON) 10 MEQ tablet TAKE 1 TABLET BY MOUTH ONCE DAILY 06/21/18   Chilton Si, MD  warfarin (COUMADIN) 5 MG tablet TAKE 1/2  TO 1 (ONE-HALF TO ONE) TABLET BY MOUTH ONCE DAILY AS DIRECTED BY  COUMADIN  CLINIC 07/20/18   Chilton Si, MD     Vital Signs: BP (!) 130/58   Pulse (!) 50   Temp 98.3 F (36.8 C) (Oral)   Resp 15   SpO2 99%   Physical Exam Vitals signs and nursing note reviewed.  Constitutional:      General: She is not in acute distress.    Appearance: Normal appearance.  Pulmonary:     Effort: Pulmonary effort is normal. No respiratory distress.  Skin:    General: Skin is warm and dry.     Comments: Right groin incision soft without active bleeding or hematoma.  Neurological:     Mental Status: She is alert.     Comments: Alert, awake, and oriented x3. Demonstrates expressive aphasia. PERRL bilaterally. EOMs intact bilaterally without nystagmus or subjective diplopia. Visual fields not assessed. No facial asymmetry. Tongue midline. Can spontaneously move all extremities. Pronator drift not assessed. Fine motor and coordination not assessed. Gait not assessed. Romberg not assessed. Heel to toe not assessed. Distal pulses palpable bilaterally with Doppler.  Psychiatric:        Mood and Affect: Mood normal.        Behavior: Behavior normal.        Thought Content: Thought content normal.        Judgment: Judgment normal.     Imaging: Ct Angio Head W Or Wo Contrast  Result Date: 10/03/2018 CLINICAL DATA:  Focal neuro deficit with stroke suspected-deficit is not specified in the history or charted. EXAM: CT ANGIOGRAPHY HEAD  AND NECK TECHNIQUE: Multidetector CT imaging of the head and neck was performed using the standard protocol during bolus administration of intravenous contrast. Multiplanar CT image reconstructions and MIPs were obtained to evaluate the vascular anatomy. Carotid stenosis measurements (when applicable) are obtained utilizing NASCET criteria, using the distal internal carotid diameter as the denominator. CONTRAST:  50mL ISOVUE-370 IOPAMIDOL (ISOVUE-370) INJECTION  76% COMPARISON:  Head CT from earlier today. FINDINGS: CTA NECK FINDINGS Aortic arch: The arch and great vessel origins are not covered. Right carotid system: Moderate calcified plaque about the common carotid bifurcation without ulceration or flow limiting stenosis. Left carotid system: Bulky calcified plaque at the ICA bulb with no flow limiting stenosis or ulceration. No ICA beading. Vertebral arteries: The subclavian origins are not covered. There is subclavian atherosclerosis with 50% narrowing on the left. Mild dominance of the right vertebral artery. The vertebral arteries are tortuous without beading or flow limiting stenosis. Skeleton: Usual degenerative changes. No acute or aggressive finding. Other neck: No evident mass or inflammation. Upper chest: No acute finding Review of the MIP images confirms the above findings CTA HEAD FINDINGS Anterior circulation: Left M2 branch occlusion with paucity of downstream vessels. Calcified plaque on the carotid siphons. No proximal flow limiting stenosis. Negative for aneurysm. Posterior circulation: Vertebrobasilar arteries are smooth and widely patent. Hypoplastic left P1 and aplastic right P1 segments. No branch occlusion or flow limiting stenosis. Venous sinuses: Negative Anatomic variants: As above Delayed phase: Not obtained in the emergent setting Review of the MIP images confirms the above findings Critical Value/emergent results were called by telephone at the time of interpretation on 10/03/2018 at 4:25 pm to Dr. Felicie MornAVID SMITH , who verbally acknowledged these results. IMPRESSION: 1. Left M2 branch occlusion. 2. Atherosclerosis but no high-grade stenosis or embolic source identified in the neck. 3. 50% narrowing of the proximal left subclavian. 4. The arch and great vessel ostia were not covered in the field of view. Electronically Signed   By: Marnee SpringJonathon  Watts M.D.   On: 10/03/2018 16:28   Ct Angio Neck W Or Wo Contrast  Result Date: 10/03/2018 CLINICAL  DATA:  Focal neuro deficit with stroke suspected-deficit is not specified in the history or charted. EXAM: CT ANGIOGRAPHY HEAD AND NECK TECHNIQUE: Multidetector CT imaging of the head and neck was performed using the standard protocol during bolus administration of intravenous contrast. Multiplanar CT image reconstructions and MIPs were obtained to evaluate the vascular anatomy. Carotid stenosis measurements (when applicable) are obtained utilizing NASCET criteria, using the distal internal carotid diameter as the denominator. CONTRAST:  50mL ISOVUE-370 IOPAMIDOL (ISOVUE-370) INJECTION 76% COMPARISON:  Head CT from earlier today. FINDINGS: CTA NECK FINDINGS Aortic arch: The arch and great vessel origins are not covered. Right carotid system: Moderate calcified plaque about the common carotid bifurcation without ulceration or flow limiting stenosis. Left carotid system: Bulky calcified plaque at the ICA bulb with no flow limiting stenosis or ulceration. No ICA beading. Vertebral arteries: The subclavian origins are not covered. There is subclavian atherosclerosis with 50% narrowing on the left. Mild dominance of the right vertebral artery. The vertebral arteries are tortuous without beading or flow limiting stenosis. Skeleton: Usual degenerative changes. No acute or aggressive finding. Other neck: No evident mass or inflammation. Upper chest: No acute finding Review of the MIP images confirms the above findings CTA HEAD FINDINGS Anterior circulation: Left M2 branch occlusion with paucity of downstream vessels. Calcified plaque on the carotid siphons. No proximal flow limiting stenosis. Negative for aneurysm. Posterior  circulation: Vertebrobasilar arteries are smooth and widely patent. Hypoplastic left P1 and aplastic right P1 segments. No branch occlusion or flow limiting stenosis. Venous sinuses: Negative Anatomic variants: As above Delayed phase: Not obtained in the emergent setting Review of the MIP images  confirms the above findings Critical Value/emergent results were called by telephone at the time of interpretation on 10/03/2018 at 4:25 pm to Dr. Felicie Morn , who verbally acknowledged these results. IMPRESSION: 1. Left M2 branch occlusion. 2. Atherosclerosis but no high-grade stenosis or embolic source identified in the neck. 3. 50% narrowing of the proximal left subclavian. 4. The arch and great vessel ostia were not covered in the field of view. Electronically Signed   By: Marnee Spring M.D.   On: 10/03/2018 16:28   Dg Chest Port 1 View  Result Date: 10/04/2018 CLINICAL DATA:  Stroke EXAM: PORTABLE CHEST 1 VIEW COMPARISON:  01/09/2015 FINDINGS: Borderline mild cardiomegaly. Aortic atherosclerosis. Bronchovascular crowding versus vascular congestion, no pulmonary edema. Biapical pleuroparenchymal scarring. No consolidation, pleural effusion, or pneumothorax. No acute osseous abnormalities are seen. Kyphoplasty in the thoracic vertebra. IMPRESSION: Borderline mild cardiomegaly. Bronchovascular crowding versus vascular congestion. Electronically Signed   By: Narda Rutherford M.D.   On: 10/04/2018 03:03   Ct Head Code Stroke Wo Contrast  Result Date: 10/03/2018 CLINICAL DATA:  Code stroke. 84 y/o F; Focal neuro deficit, < 6 hrs, stroke suspected. EXAM: CT HEAD WITHOUT CONTRAST TECHNIQUE: Contiguous axial images were obtained from the base of the skull through the vertex without intravenous contrast. COMPARISON:  09/21/2018 CT head FINDINGS: Brain: No evidence of acute infarction, hemorrhage, hydrocephalus, extra-axial collection or mass lesion/mass effect. Stable chronic infarctions within the right lateral cerebellar hemisphere. Stable white matter hypodensities compatible with chronic microvascular ischemic changes and stable volume loss of the brain. Stable small scattered hypodensities within the basal ganglia and left pons, likely represent chronic lacunar infarctions. Vascular: Calcific  atherosclerosis of carotid siphons. No hyperdense vessel identified. Skull: Normal. Negative for fracture or focal lesion. Sinuses/Orbits: No acute finding. Other: None. ASPECTS [facility] Stroke Program Early CT Score) - Ganglionic level infarction (caudate, lentiform nuclei, internal capsule, insula, M1-M3 cortex): 7 - Supraganglionic infarction (M4-M6 cortex): 3 Total score (0-10 with 10 being normal): 10 IMPRESSION: 1. No acute intracranial abnormality identified. 2. ASPECTS is 10. 3. Stable chronic microvascular ischemic changes and volume loss of the brain. Stable small chronic infarctions in right lateral cerebellar hemisphere, left pons, and bilateral basal ganglia. These results were communicated to Dr. Laurence Slate at 4:11 pmon 12/16/2019by text page via the [facility] messaging system. Electronically Signed   By: Mitzi Hansen M.D.   On: 10/03/2018 16:12    Labs:  CBC: Recent Labs    10/13/17 1658 05/11/18 1454 10/03/18 1555 10/03/18 1604  WBC 6.5 7.4 10.9*  --   HGB 14.0 14.6 13.7 14.6  HCT 41.3 44.3 43.5 43.0  PLT 249 230 300  --     COAGS: Recent Labs    06/30/18 1505 07/28/18 1510 09/01/18 1520 10/03/18 1555  INR 2.3 2.4 2.8 2.52  APTT  --   --   --  35    BMP: Recent Labs    10/13/17 1658 04/12/18 1609 10/03/18 1555 10/03/18 1604  NA 140  --  135 135  K 4.1  --  4.7 4.7  CL 104  --  101 102  CO2 27  --  25  --   GLUCOSE 118*  --  163* 161*  BUN 17  --  34* 36*  CALCIUM 8.7 9.8 8.8*  --   CREATININE 0.83  --  1.79* 1.60*  GFRNONAA  --   --  25*  --   GFRAA  --   --  29*  --     LIVER FUNCTION TESTS: Recent Labs    10/13/17 1658 10/03/18 1555  BILITOT 0.8 0.6  AST 16 22  ALT 7 16  ALKPHOS  --  52  PROT 6.1 6.4*  ALBUMIN  --  3.2*    Assessment and Plan:  Left MCA M2, M3, 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 Corliss Skains. Patient's condition improving- still with expressive aphasia but can  spontaneously move all extremities. Right groin incision stable. Appreciate and agree with neurology management. IR to follow.   Electronically Signed: Elwin Mocha, PA-C 10/04/2018, 8:49 AM   I spent a total of 25 Minutes at the the patient's bedside AND on the patient's hospital floor or unit, greater than 50% of which was counseling/coordinating care for left MCA M2, M3, and M4 occlusions s/p attempted revascularization.

## 2018-10-04 NOTE — Progress Notes (Signed)
STROKE TEAM PROGRESS NOTE   SUBJECTIVE (INTERVAL HISTORY) Her daughter is at the bedside.  Overall her condition is stable. Still has global aphasia with word salad. Still has left sided weakness, difficulty to exam though due to right LE just s/p sheath removal. MRI pending. BP was low this am and received 1L bolus, now on IVF.    OBJECTIVE Temp:  [97.5 F (36.4 C)-98.3 F (36.8 C)] 98.3 F (36.8 C) (12/17 0400) Pulse Rate:  [36-122] 67 (12/17 0945) Cardiac Rhythm: Atrial fibrillation (12/17 0400) Resp:  [1-28] 24 (12/17 0945) BP: (82-171)/(44-118) 132/110 (12/17 0945) SpO2:  [75 %-100 %] 100 % (12/17 0945) Arterial Line BP: (127-181)/(51-92) 157/76 (12/16 1930)  Recent Labs  Lab 10/03/18 1555 10/03/18 1922  GLUCAP 153* 113*   Recent Labs  Lab 10/03/18 1555 10/03/18 1604 10/04/18 0856  NA 135 135 139  K 4.7 4.7 4.4  CL 101 102 108  CO2 25  --  20*  GLUCOSE 163* 161* 135*  BUN 34* 36* 21  CREATININE 1.79* 1.60* 1.51*  CALCIUM 8.8*  --  7.7*   Recent Labs  Lab 10/03/18 1555  AST 22  ALT 16  ALKPHOS 52  BILITOT 0.6  PROT 6.4*  ALBUMIN 3.2*   Recent Labs  Lab 10/03/18 1555 10/03/18 1604 10/04/18 0856  WBC 10.9*  --  14.2*  NEUTROABS 7.5  --   --   HGB 13.7 14.6 11.5*  HCT 43.5 43.0 37.7  MCV 98.9  --  100.3*  PLT 300  --  285   No results for input(s): CKTOTAL, CKMB, CKMBINDEX, TROPONINI in the last 168 hours. Recent Labs    10/03/18 1555 10/04/18 0856  LABPROT 26.9* 32.9*  INR 2.52 3.27   No results for input(s): COLORURINE, LABSPEC, PHURINE, GLUCOSEU, HGBUR, BILIRUBINUR, KETONESUR, PROTEINUR, UROBILINOGEN, NITRITE, LEUKOCYTESUR in the last 72 hours.  Invalid input(s): APPERANCEUR     Component Value Date/Time   CHOL 103 10/04/2018 0510   TRIG 88 10/04/2018 0510   HDL 26 (L) 10/04/2018 0510   CHOLHDL 4.0 10/04/2018 0510   VLDL 18 10/04/2018 0510   LDLCALC 59 10/04/2018 0510   LDLCALC 78 10/13/2017 1658   Lab Results  Component Value Date    HGBA1C 5.2 10/04/2018   No results found for: LABOPIA, COCAINSCRNUR, LABBENZ, AMPHETMU, THCU, LABBARB  No results for input(s): ETH in the last 168 hours.  I have personally reviewed the radiological images below and agree with the radiology interpretations.  Ct Angio Head W Or Wo Contrast  Result Date: 10/03/2018 CLINICAL DATA:  Focal neuro deficit with stroke suspected-deficit is not specified in the history or charted. EXAM: CT ANGIOGRAPHY HEAD AND NECK TECHNIQUE: Multidetector CT imaging of the head and neck was performed using the standard protocol during bolus administration of intravenous contrast. Multiplanar CT image reconstructions and MIPs were obtained to evaluate the vascular anatomy. Carotid stenosis measurements (when applicable) are obtained utilizing NASCET criteria, using the distal internal carotid diameter as the denominator. CONTRAST:  50mL ISOVUE-370 IOPAMIDOL (ISOVUE-370) INJECTION 76% COMPARISON:  Head CT from earlier today. FINDINGS: CTA NECK FINDINGS Aortic arch: The arch and great vessel origins are not covered. Right carotid system: Moderate calcified plaque about the common carotid bifurcation without ulceration or flow limiting stenosis. Left carotid system: Bulky calcified plaque at the ICA bulb with no flow limiting stenosis or ulceration. No ICA beading. Vertebral arteries: The subclavian origins are not covered. There is subclavian atherosclerosis with 50% narrowing on the left. Mild dominance  of the right vertebral artery. The vertebral arteries are tortuous without beading or flow limiting stenosis. Skeleton: Usual degenerative changes. No acute or aggressive finding. Other neck: No evident mass or inflammation. Upper chest: No acute finding Review of the MIP images confirms the above findings CTA HEAD FINDINGS Anterior circulation: Left M2 branch occlusion with paucity of downstream vessels. Calcified plaque on the carotid siphons. No proximal flow limiting stenosis.  Negative for aneurysm. Posterior circulation: Vertebrobasilar arteries are smooth and widely patent. Hypoplastic left P1 and aplastic right P1 segments. No branch occlusion or flow limiting stenosis. Venous sinuses: Negative Anatomic variants: As above Delayed phase: Not obtained in the emergent setting Review of the MIP images confirms the above findings Critical Value/emergent results were called by telephone at the time of interpretation on 10/03/2018 at 4:25 pm to Dr. Felicie Morn , who verbally acknowledged these results. IMPRESSION: 1. Left M2 branch occlusion. 2. Atherosclerosis but no high-grade stenosis or embolic source identified in the neck. 3. 50% narrowing of the proximal left subclavian. 4. The arch and great vessel ostia were not covered in the field of view. Electronically Signed   By: Marnee Spring M.D.   On: 10/03/2018 16:28   Ct Head Wo Contrast  Result Date: 09/22/2018 CLINICAL DATA:  Confusion EXAM: CT HEAD WITHOUT CONTRAST TECHNIQUE: Contiguous axial images were obtained from the base of the skull through the vertex without intravenous contrast. COMPARISON:  03/31/2017 . FINDINGS: Brain: Global atrophy. Prominent chronic ischemic changes in the periventricular white matter. Encephalomalacia in the right cerebellar hemisphere. No mass effect, midline shift, or acute hemorrhage. Vascular: No hyperdense vessel or unexpected calcification. Skull: Cranium is intact. Sinuses/Orbits: Visualized paranasal sinuses and mastoid air cells are clear. Visualized orbits are within normal limits. Other: Noncontributory IMPRESSION: No acute intracranial pathology.  Chronic changes. Electronically Signed   By: Jolaine Click M.D.   On: 09/22/2018 07:47   Ct Angio Neck W Or Wo Contrast  Result Date: 10/03/2018 CLINICAL DATA:  Focal neuro deficit with stroke suspected-deficit is not specified in the history or charted. EXAM: CT ANGIOGRAPHY HEAD AND NECK TECHNIQUE: Multidetector CT imaging of the head and  neck was performed using the standard protocol during bolus administration of intravenous contrast. Multiplanar CT image reconstructions and MIPs were obtained to evaluate the vascular anatomy. Carotid stenosis measurements (when applicable) are obtained utilizing NASCET criteria, using the distal internal carotid diameter as the denominator. CONTRAST:  50mL ISOVUE-370 IOPAMIDOL (ISOVUE-370) INJECTION 76% COMPARISON:  Head CT from earlier today. FINDINGS: CTA NECK FINDINGS Aortic arch: The arch and great vessel origins are not covered. Right carotid system: Moderate calcified plaque about the common carotid bifurcation without ulceration or flow limiting stenosis. Left carotid system: Bulky calcified plaque at the ICA bulb with no flow limiting stenosis or ulceration. No ICA beading. Vertebral arteries: The subclavian origins are not covered. There is subclavian atherosclerosis with 50% narrowing on the left. Mild dominance of the right vertebral artery. The vertebral arteries are tortuous without beading or flow limiting stenosis. Skeleton: Usual degenerative changes. No acute or aggressive finding. Other neck: No evident mass or inflammation. Upper chest: No acute finding Review of the MIP images confirms the above findings CTA HEAD FINDINGS Anterior circulation: Left M2 branch occlusion with paucity of downstream vessels. Calcified plaque on the carotid siphons. No proximal flow limiting stenosis. Negative for aneurysm. Posterior circulation: Vertebrobasilar arteries are smooth and widely patent. Hypoplastic left P1 and aplastic right P1 segments. No branch occlusion or flow limiting stenosis.  Venous sinuses: Negative Anatomic variants: As above Delayed phase: Not obtained in the emergent setting Review of the MIP images confirms the above findings Critical Value/emergent results were called by telephone at the time of interpretation on 10/03/2018 at 4:25 pm to Dr. Felicie Morn , who verbally acknowledged these  results. IMPRESSION: 1. Left M2 branch occlusion. 2. Atherosclerosis but no high-grade stenosis or embolic source identified in the neck. 3. 50% narrowing of the proximal left subclavian. 4. The arch and great vessel ostia were not covered in the field of view. Electronically Signed   By: Marnee Spring M.D.   On: 10/03/2018 16:28   Dg Chest Port 1 View  Result Date: 10/04/2018 CLINICAL DATA:  Stroke EXAM: PORTABLE CHEST 1 VIEW COMPARISON:  01/09/2015 FINDINGS: Borderline mild cardiomegaly. Aortic atherosclerosis. Bronchovascular crowding versus vascular congestion, no pulmonary edema. Biapical pleuroparenchymal scarring. No consolidation, pleural effusion, or pneumothorax. No acute osseous abnormalities are seen. Kyphoplasty in the thoracic vertebra. IMPRESSION: Borderline mild cardiomegaly. Bronchovascular crowding versus vascular congestion. Electronically Signed   By: Narda Rutherford M.D.   On: 10/04/2018 03:03   Ct Head Code Stroke Wo Contrast  Result Date: 10/03/2018 CLINICAL DATA:  Code stroke. 84 y/o F; Focal neuro deficit, < 6 hrs, stroke suspected. EXAM: CT HEAD WITHOUT CONTRAST TECHNIQUE: Contiguous axial images were obtained from the base of the skull through the vertex without intravenous contrast. COMPARISON:  09/21/2018 CT head FINDINGS: Brain: No evidence of acute infarction, hemorrhage, hydrocephalus, extra-axial collection or mass lesion/mass effect. Stable chronic infarctions within the right lateral cerebellar hemisphere. Stable white matter hypodensities compatible with chronic microvascular ischemic changes and stable volume loss of the brain. Stable small scattered hypodensities within the basal ganglia and left pons, likely represent chronic lacunar infarctions. Vascular: Calcific atherosclerosis of carotid siphons. No hyperdense vessel identified. Skull: Normal. Negative for fracture or focal lesion. Sinuses/Orbits: No acute finding. Other: None. ASPECTS [facility] Stroke Program  Early CT Score) - Ganglionic level infarction (caudate, lentiform nuclei, internal capsule, insula, M1-M3 cortex): 7 - Supraganglionic infarction (M4-M6 cortex): 3 Total score (0-10 with 10 being normal): 10 IMPRESSION: 1. No acute intracranial abnormality identified. 2. ASPECTS is 10. 3. Stable chronic microvascular ischemic changes and volume loss of the brain. Stable small chronic infarctions in right lateral cerebellar hemisphere, left pons, and bilateral basal ganglia. These results were communicated to Dr. Laurence Slate at 4:11 pmon 12/16/2019by text page via the [facility] messaging system. Electronically Signed   By: Mitzi Hansen M.D.   On: 10/03/2018 16:12   MRI pending  TTE pending   PHYSICAL EXAM  Temp:  [97.5 F (36.4 C)-98.3 F (36.8 C)] 98.3 F (36.8 C) (12/17 0400) Pulse Rate:  [36-122] 67 (12/17 0945) Resp:  [1-28] 24 (12/17 0945) BP: (82-171)/(44-118) 132/110 (12/17 0945) SpO2:  [75 %-100 %] 100 % (12/17 0945) Arterial Line BP: (127-181)/(51-92) 157/76 (12/16 1930)  General - Well nourished, well developed, in no apparent distress.  Ophthalmologic - fundi not visualized due to noncooperation.  Cardiovascular - irregularly irregular heart rate and rhythm with RVR.  Neuro - awake alert, eyes open. Word salad, global aphasia except that she is able to close eyes on voice, but not following other simple commands. PERRL, no gaze preference, tracking to both side, but not blinking to visual threat on the right. Slight right facial droop, tongue protrusion not cooperative. BUE in flexed position and not cooperative on extension, hand grip right weaker then left. LLE 4/5 and RLE not able to test due to sheath  removal. However, distal DF LLF 4+/5 but 1-2/5 on the right. DTR 1+ and no babinski. Sensation, coordination and gait not tested.    ASSESSMENT/PLAN [name] is a 84 y.o. female with history of Afib on coumadin, stroke 10 years ago without residue, DM,  HTN, HLD admitted for aphasia and right side weakness. No tPA given due to therapeutic INR.    Stroke:  left MCA infarct due to left M2 occlusion s/p IR but unsuccessful, embolic, secondary to Afib even with therapeutic INR  Resultant aphasia and right hemiparesis  MRI  Pending  CT head right cerebellar infarct, old  CTA head and neck - left M2 occlusion, left subclavian 50% stenosis  2D Echo  pending  DSA - left M2 occlusion, and M3/M4 occlusion but unsuccessful intervention  LDL 59  HgbA1c 5.2  SCDs for VTE prophylaxis  NPO now  warfarin daily prior to admission, now on No antithrombotic given size of stroke and INR > 2. Will consider to switch to eliquis 2.5mg  bid once appropriate  Ongoing aggressive stroke risk factor management  Therapy recommendations:  Pending   Disposition:  Pending   Persistent AF  Followed with Dr. Duke Salviaandolph in cardiology  On coumadin PTA  On nadolol at home  INR 2.52 on admission  INR daily  Will consider switch to eliquis 2.5mg  bid once INR < 2 and timing based on stroke size  Hx of stroke  Stroke 10 years ago with aphasia, received tPA and aphasia resolved. No residue. 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  Diabetes  HgbA1c 5.2 goal < 7.0  Controlled  CBG monitoring  SSI  Hypertension . Stable now . Off neo and cleviprex . Was low this am, given 1L bolus . Permissive hypertension (OK if <180/105) for 24-48 hours post stroke due to unsuccessful IR procedure  Long term BP goal normotensive  Hyperlipidemia  Home meds: lipitor 20   LDL 59, goal < 70  Resume lipitor once po access  Continue statin at discharge  Other Stroke Risk Factors  Advanced age  Other Active Problems  Elevated Cre 1.60->1.51  Leukocytosis - WBC 10.9->14.2 - afebrile   Hospital day # 1  This patient is critically ill due to stroke, unsuccesssful intervention, low BP, afib on coumadin, hx of stroke and  at significant risk of neurological worsening, death form recurrent stroke, stroke extension, hemorrhagic conversion, heart failure, seizure. This patient's care requires constant monitoring of vital signs, hemodynamics, respiratory and cardiac monitoring, review of multiple databases, neurological assessment, discussion with family, other specialists and medical decision making of high complexity. I spent 40 minutes of neurocritical care time in the care of this patient. I had long discussion with daughter at bedside, updated pt current condition, treatment plan and potential prognosis. They expressed understanding and appreciation.    Marvel PlanJindong Curry Seefeldt, MD PhD Stroke Neurology 10/04/2018 10:32 AM    To contact Stroke Continuity provider, please refer to [email or web address]. After hours, contact General Neurology

## 2018-10-04 NOTE — Evaluation (Signed)
Clinical/Bedside Swallow Evaluation Patient Details  Name: [name] MRN: [id number] Date of Birth: October 11, 1930  Today's Date: 10/04/2018 Time: SLP Start Time (ACUTE ONLY): 1630 SLP Stop Time (ACUTE ONLY): 1646 SLP Time Calculation (min) (ACUTE ONLY): 16 min  Past Medical History:  Past Medical History:  Diagnosis Date  . Atrial fibrillation (HCC)   . Chronic anticoagulation   . Chronic anticoagulation   . CVA (cerebrovascular accident) (HCC)   . Diabetes mellitus   . History of chicken pox   . Hyperlipidemia   . Hypertension    Past Surgical History:  Past Surgical History:  Procedure Laterality Date  . APPENDECTOMY    . BACK SURGERY    . CARDIAC CATHETERIZATION  11/09/91   EF 70%  . DILATION AND CURETTAGE OF UTERUS     HPI:  Pt is an 84 y.o. female with history of Afib on coumadin, stroke 10 years ago without residue, DM, HTN, HLD admitted for aphasia and right side weakness. MRI showed acute L MCA infarct (s/p IR but unsuccessful) with largest area of infarction in the L parietal lobe with extension into the insula and L frontal lobe.   Assessment / Plan / Recommendation Clinical Impression  Pt does not have overt signs of aspiration, but she also cannot be fully challenged as she does not follow commands for larger, sequential boluses. She has adequate oral clearance as can be visualized with bite-sized pieces of soft solids, but she does not open her mouth consistently for observation. Her family says that she typically gravitates toward softer foods at baseline because her partial dentures are broken. Given the above and considering overall cognitive-linguistic status, would favor starting Dys 2 diet and thin liquids with full supervision. SLP will f/u for speech/language evaluation and readiness to advance diet. SLP Visit Diagnosis: Dysphagia, unspecified (R13.10)    Aspiration Risk  Mild aspiration risk    Diet Recommendation Dysphagia 2 (Fine chop);Thin  liquid   Liquid Administration via: Cup;Straw Medication Administration: Whole meds with puree Supervision: Patient able to self feed;Full supervision/cueing for compensatory strategies Compensations: Minimize environmental distractions;Slow rate;Small sips/bites Postural Changes: Seated upright at 90 degrees    Other  Recommendations Oral Care Recommendations: Oral care BID   Follow up Recommendations Inpatient Rehab      Frequency and Duration min 2x/week  2 weeks       Prognosis Prognosis for Safe Diet Advancement: Good Barriers to Reach Goals: Language deficits      Swallow Study   General HPI: Pt is an 84 y.o. female with history of Afib on coumadin, stroke 10 years ago without residue, DM, HTN, HLD admitted for aphasia and right side weakness. MRI showed acute L MCA infarct (s/p IR but unsuccessful) with largest area of infarction in the L parietal lobe with extension into the insula and L frontal lobe. Type of Study: Bedside Swallow Evaluation Previous Swallow Assessment: none in chart Diet Prior to this Study: NPO Temperature Spikes Noted: No Respiratory Status: Room air History of Recent Intubation: Yes Length of Intubations (days): (for procedure only) Behavior/Cognition: Alert;Cooperative Oral Cavity Assessment: Within Functional Limits Oral Care Completed by SLP: No Oral Cavity - Dentition: Missing dentition Vision: Functional for self-feeding Self-Feeding Abilities: Able to feed self;Needs assist Patient Positioning: Upright in bed Baseline Vocal Quality: Normal    Oral/Motor/Sensory Function Overall Oral Motor/Sensory Function: Mild impairment(difficulty following commands for full assessment) Facial ROM: Reduced right;Suspected CN VII (facial) dysfunction Facial Symmetry: Abnormal symmetry right;Suspected CN VII (facial) dysfunction  Facial Strength: Reduced right;Suspected CN VII (facial) dysfunction   Ice Chips Ice chips: Not tested   Thin Liquid Thin  Liquid: Within functional limits Presentation: Self Fed;Straw    Nectar Thick Nectar Thick Liquid: Not tested   Honey Thick Honey Thick Liquid: Not tested   Puree Puree: Within functional limits Presentation: Self Fed;Spoon   Solid     Solid: Within functional limits Presentation: Self Georjean ModeFed      Paiewonsky, Quinita Kostelecky 10/04/2018,5:45 PM  Maxcine HamLaura Paiewonsky, M.A. CCC-SLP Acute Herbalistehabilitation Services Pager [phone] Office [phone]

## 2018-10-04 NOTE — Consult Note (Signed)
NAME:  [name], MRN:  [id number], DOB:  July 29, 1930, LOS: 1 ADMISSION DATE:  10/03/2018, CONSULTATION DATE:  10/04/2018 REFERRING MD:  Corliss Skains, CHIEF COMPLAINT:  hypotension  Brief History   34 yoF presenting with acute stroke  History of present illness   84 year old female with history of afib on coumadin, HTN, HLD, and diabetes who presented from home with acute stroke.  Per daughter at bedside, patient lives at home with her husband and is independent in her ADLs.  Uses a cane at baseline because she refuses to use a walker.  Husband noticed acute onset of right sided weakness and confusion 12/16 at 1440. On arrival to ER, she was found to have right hemiplegia and aphasic.  NIH was 15.  CT brain showed no acute infarct or hemorrhage.  CTA head/ neck showed left M2 occlusion.  INR noted at 2.5.  Taken to neuro IR and intubated for cerebral angiogram however due to severe tortuosity of anatomy, endovascular treatment was unable to be performed.  Patient was extubated post procedure and returned to ICU for further care and monitoring.  Right femoral sheath remained due to elevated coags.  This morning at shift change, patient apparently pulled out right groin sheath with significant saturation of bed linens, development of hematoma, and acute hypotension, SBP as low as 54.  Direct pressure held at site and patient given 1L NS bolus.  No am coags or CBC.  PCCM urgently consulted.    Past Medical History  Afib on coumadin, HTN, HLD, diabetes, former smoker, osteoporosis w/prior lumbar fusion and chronic R hip pain per daughter  Significant Hospital Events   12/16 Admit  Consults:  Neuro IR  Procedures:  12/16 cerebral angiogram  Significant Diagnostic Tests:  12/16 [facility] >> neg  12/16 CTA head/ neck >> acute left M2 occlusion  Micro Data:   Antimicrobials:  preop anceft  Interim history/subjective:  BP improved on arrival after 1L bolus, hematoma improved with  direct pressure, patient's daughter at bedside and states this is the most vocal she has been since yesterday.  Objective   Blood pressure 138/70, pulse 72, temperature 98.3 F (36.8 C), temperature source Oral, resp. rate 19, SpO2 97 %.  Intake/Output Summary (Last 24 hours) at 10/04/2018 0730 Last data filed at 10/04/2018 0600 Gross per 24 hour  Intake 1759.18 ml  Output 1925 ml  Net -165.82 ml   There were no vitals filed for this visit.  Examination: General:  Elderly female lying in bed in no acute distress, laying in large pool of liquid stool HEENT: MM pink/moist, pupils 4/reactive, no facial deficits noted, speech clear Neuro: Awake, confused and yelling it hurts (as pressure held), will follow commands, moving left side spont, drift of RUE, no significant movement in RLE CV: IRIR, doppler pulses of right PT PULM: even/non-labored, lungs bilaterally clear NW:GNFA, non-tender, bs active, foley Extremities: warm/dry, no LE edema  Skin: no rashes   Resolved Hospital Problem list    Assessment & Plan:  Acute hypotension secondary to acute blood loss from accidental removal of right femoral sheath by patient with known coagulapathy  P:  S/p pressure held for > 25 mins, groin is now soft, distal PT dopplered BP improved after 1L bolus Stat CBC, coags and type and screen  Transfuse for Hgb < 7 Frequent neurovascular checks  IR notified  Acute stroke M2 - not TPA candidate 2/2 INR; unable to have EVR 2/2 to tortuosity of anatomy P:  Per neurology and neuro IR  Remainder per primary team.  As patient appears stabilized, PCCM will sign off and be available as needed.    Best practice:  Code Status: Full  Family Communication: Patient's daughter, Cynathia who is a Charity fundraiserN, updated at bedside and on plan of care.  Disposition:   Labs   CBC: Recent Labs  Lab 10/03/18 1555 10/03/18 1604  WBC 10.9*  --   NEUTROABS 7.5  --   HGB 13.7 14.6  HCT 43.5 43.0  MCV 98.9  --     PLT 300  --     Basic Metabolic Panel: Recent Labs  Lab 10/03/18 1555 10/03/18 1604  NA 135 135  K 4.7 4.7  CL 101 102  CO2 25  --   GLUCOSE 163* 161*  BUN 34* 36*  CREATININE 1.79* 1.60*  CALCIUM 8.8*  --    GFR: Estimated Creatinine Clearance: 20 mL/min (A) (by C-G formula based on SCr of 1.6 mg/dL (H)). Recent Labs  Lab 10/03/18 1555  WBC 10.9*    Liver Function Tests: Recent Labs  Lab 10/03/18 1555  AST 22  ALT 16  ALKPHOS 52  BILITOT 0.6  PROT 6.4*  ALBUMIN 3.2*   No results for input(s): LIPASE, AMYLASE in the last 168 hours. No results for input(s): AMMONIA in the last 168 hours.  ABG    Component Value Date/Time   TCO2 27 10/03/2018 1604     Coagulation Profile: Recent Labs  Lab 10/03/18 1555  INR 2.52    Cardiac Enzymes: No results for input(s): CKTOTAL, CKMB, CKMBINDEX, TROPONINI in the last 168 hours.  HbA1C: Hgb A1c MFr Bld  Date/Time Value Ref Range Status  10/04/2018 05:10 AM 5.2 4.8 - 5.6 % Final    Comment:    (NOTE) Pre diabetes:          5.7%-6.4% Diabetes:              >6.4% Glycemic control for   <7.0% adults with diabetes     CBG: Recent Labs  Lab 10/03/18 1555  GLUCAP 153*    Review of Systems:   Unable   Past Medical History  She,  has a past medical history of Atrial fibrillation (HCC), Chronic anticoagulation, Chronic anticoagulation, CVA (cerebrovascular accident) (HCC), Diabetes mellitus, History of chicken pox, Hyperlipidemia, and Hypertension.   Surgical History    Past Surgical History:  Procedure Laterality Date  . APPENDECTOMY    . BACK SURGERY    . CARDIAC CATHETERIZATION  11/09/91   EF 70%  . DILATION AND CURETTAGE OF UTERUS       Social History   reports that she quit smoking about 19 years ago. She has never used smokeless tobacco. She reports that she does not drink alcohol or use drugs.   Family History   Her family history includes Heart disease in her father and mother; Heart  failure in her brother; Hypertension in her mother; Stroke in her mother. There is no history of Osteoporosis.   Allergies Allergies  Allergen Reactions  . Cozaar Other (See Comments)    "Almost passed out"  . Hydralazine Other (See Comments)    "almost passed out"  . Hyzaar [Losartan Potassium-Hctz] Other (See Comments)    "almost passed out"  . Sulfa Drugs Cross Reactors Other (See Comments)    "turned me blue"   . Lisinopril     Unknown reaction, per pt     Home Medications  Prior to Admission medications  Medication Sig Start Date End Date Taking? Authorizing Provider  atorvastatin (LIPITOR) 20 MG tablet TAKE 1/2 (ONE-HALF) TABLET BY MOUTH ONCE DAILY IN THE MORNING 08/29/18   Chilton Si, MD  diazepam (VALIUM) 2 MG tablet TAKE 1 TABLET BY MOUTH TWICE DAILY AS NEEDED FOR ANXIETY 03/08/18   Sheliah Hatch, MD  furosemide (LASIX) 20 MG tablet Take 10 mg by mouth as needed for fluid or edema.    [provider]  levothyroxine (SYNTHROID, LEVOTHROID) 25 MCG tablet TAKE 1 TABLET BY MOUTH ONCE DAILY BEFORE  BREAKFAST 07/11/18   Sheliah Hatch, MD  nadolol (CORGARD) 20 MG tablet TAKE 1/2 (ONE-HALF) TABLET BY MOUTH ONCE DAILY 07/26/18   Chilton Si, MD  nitroGLYCERIN (NITROSTAT) 0.4 MG SL tablet Place 0.4 mg under the tongue every 5 (five) minutes as needed (chest pain).     [provider]  oxyCODONE-acetaminophen (PERCOCET/ROXICET) 5-325 MG tablet Take 1 tablet by mouth every 4 (four) hours as needed for severe pain.     [provider]  potassium chloride (K-DUR,KLOR-CON) 10 MEQ tablet TAKE 1 TABLET BY MOUTH ONCE DAILY 06/21/18   Chilton Si, MD  warfarin (COUMADIN) 5 MG tablet TAKE 1/2 TO 1 (ONE-HALF TO ONE) TABLET BY MOUTH ONCE DAILY AS DIRECTED BY  COUMADIN  CLINIC 07/20/18   Chilton Si, MD     Critical care time: 35 mins    Posey Boyer, [facility] Pgr: [phone] or if no answer  [phone] 10/04/2018, 7:58 AM

## 2018-10-04 NOTE — Progress Notes (Signed)
PT Cancellation Note  Patient Details Name: Pincus LargeCarolyn W White Reichard MRN: [id number] DOB: 11/25/1929   Cancelled Treatment:    Reason Eval/Treat Not Completed: Patient not medically ready. Pt pulled out R groin sheath this morning and remains on strict bedrest. PT to return as able, as appropriate to complete PT eval.  Lewis ShockAshly Daisi Kentner, PT, DPT Acute Rehabilitation Services Pager #: [phone] Office #: [phone]    Iona Hansenshly M Velinda Wrobel 10/04/2018, 11:28 AM

## 2018-10-05 ENCOUNTER — Other Ambulatory Visit: Payer: Self-pay

## 2018-10-05 ENCOUNTER — Encounter (HOSPITAL_COMMUNITY): Payer: Self-pay | Admitting: Interventional Radiology

## 2018-10-05 DIAGNOSIS — Z7901 Long term (current) use of anticoagulants: Secondary | ICD-10-CM

## 2018-10-05 DIAGNOSIS — Z8673 Personal history of transient ischemic attack (TIA), and cerebral infarction without residual deficits: Secondary | ICD-10-CM

## 2018-10-05 DIAGNOSIS — I4891 Unspecified atrial fibrillation: Secondary | ICD-10-CM | POA: Diagnosis present

## 2018-10-05 DIAGNOSIS — N183 Chronic kidney disease, stage 3 unspecified: Secondary | ICD-10-CM | POA: Diagnosis present

## 2018-10-05 DIAGNOSIS — E785 Hyperlipidemia, unspecified: Secondary | ICD-10-CM | POA: Diagnosis present

## 2018-10-05 DIAGNOSIS — I6602 Occlusion and stenosis of left middle cerebral artery: Secondary | ICD-10-CM

## 2018-10-05 DIAGNOSIS — R0682 Tachypnea, not elsewhere classified: Secondary | ICD-10-CM

## 2018-10-05 DIAGNOSIS — E119 Type 2 diabetes mellitus without complications: Secondary | ICD-10-CM

## 2018-10-05 DIAGNOSIS — I10 Essential (primary) hypertension: Secondary | ICD-10-CM

## 2018-10-05 LAB — BASIC METABOLIC PANEL
Anion gap: 12 (ref 5–15)
BUN: 16 mg/dL (ref 8–23)
CO2: 21 mmol/L — ABNORMAL LOW (ref 22–32)
Calcium: 7.3 mg/dL — ABNORMAL LOW (ref 8.9–10.3)
Chloride: 106 mmol/L (ref 98–111)
Creatinine, Ser: 1.44 mg/dL — ABNORMAL HIGH (ref 0.44–1.00)
GFR calc Af Amer: 38 mL/min — ABNORMAL LOW (ref >60)
GFR calc non Af Amer: 33 mL/min — ABNORMAL LOW (ref >60)
Glucose, Bld: 114 mg/dL — ABNORMAL HIGH (ref 70–99)
Potassium: 3.8 mmol/L (ref 3.5–5.1)
Sodium: 139 mmol/L (ref 135–145)

## 2018-10-05 LAB — CBC
HCT: 29.8 % — ABNORMAL LOW (ref 36.0–46.0)
Hemoglobin: 9.7 g/dL — ABNORMAL LOW (ref 12.0–15.0)
MCH: 31.9 pg (ref 26.0–34.0)
MCHC: 32.6 g/dL (ref 30.0–36.0)
MCV: 98 fL (ref 80.0–100.0)
Platelets: 263 10*3/uL (ref 150–400)
RBC: 3.04 MIL/uL — ABNORMAL LOW (ref 3.87–5.11)
RDW: 12.2 % (ref 11.5–15.5)
WBC: 10.1 10*3/uL (ref 4.0–10.5)
nRBC: 0 % (ref 0.0–0.2)

## 2018-10-05 LAB — PROTIME-INR
INR: 2.99
Prothrombin Time: 30.6 seconds — ABNORMAL HIGH (ref 11.4–15.2)

## 2018-10-05 MED ORDER — NADOLOL 20 MG PO TABS
10.0000 mg | ORAL_TABLET | Freq: Every day | ORAL | Status: DC
  Administered 2018-10-05 – 2018-10-06 (×2): 10 mg via ORAL
  Filled 2018-10-05 (×2): qty 1

## 2018-10-05 MED ORDER — AMLODIPINE BESYLATE 2.5 MG PO TABS
2.5000 mg | ORAL_TABLET | Freq: Every day | ORAL | Status: DC
  Administered 2018-10-05 – 2018-10-06 (×2): 2.5 mg via ORAL
  Filled 2018-10-05 (×2): qty 1

## 2018-10-05 NOTE — Progress Notes (Signed)
Inpatient Rehabilitation Admissions Coordinator  I met with pt, daughter and grandson at bedside. We discussed goals an expectations of an inpt rehab admit and they prefer inpt rehab rather than SNF. Pt's spouse is available to provide 24/7 min physical assist. I will follow up tomorrow to clarify when pt felt medically ready to d/c.  Danne Baxter, RN, MSN Rehab Admissions Coordinator [phone] 10/05/2018 5:25 PM

## 2018-10-05 NOTE — Progress Notes (Signed)
10/05/2018 PT Evaluation: Pt was able to sit EOB and stand twice taking side steps with two person assist to [facility].  She is limited by low back pain and from what family reports has not been put back on her normal dose of home pain meds.  Pt has a very supportive and physically able husband and daughter who is an Charity fundraiser at [employer].  She would benefit from intensive post acute rehab to help her return to her prior level of function (ambulating household distances with assist).   PT to follow acutely for deficits listed below.  Rollene Rotunda Rona Tomson, PT, DPT  Acute Rehabilitation [phone] pager [phone] office      10/05/18 1432  PT Visit Information  Last PT Received On 10/05/18  Assistance Needed +2  PT/OT/SLP Co-Evaluation/Treatment Yes  Reason for Co-Treatment Complexity of the patient's impairments (multi-system involvement);Necessary to address cognition/behavior during functional activity;For patient/therapist safety;To address functional/ADL transfers  PT goals addressed during session Mobility/safety with mobility;Balance;Strengthening/ROM  History of Present Illness 84 y.o. female admitted on 10/03/18 for aphasia and R sided weakness.  Pt dx with L MCA infarct due to L M2 occlusion s/p IR attempted revascularization, but unsuccessful.  Pt also with persistent A-fib and HTN.  Pt with significant PMH of HTN, DM, CVA (per daughter no significant residual issues from prior stroke), A-fib on chronic anticoagulation, back surgery, and bil UE fractures due to falls.  Precautions  Precautions Fall;Other (comment)  Precaution Comments chronic back pain for which she takes daily narcotics  Home Living  Family/patient expects to be discharged to: Private residence  Living Arrangements Spouse/significant other  Available Help at Discharge Family;Available 24 hours/day  Type of Home House  Home Access Stairs to enter  Entrance Stairs-Number of Steps 2  Entrance Stairs-Rails None  Home Layout  One level  Bathroom Shower/Tub Tub/shower unit (garden tub)  Engineer, water - 2 wheels;Cane - single point;Shower seat (lift chair)  Prior Function  Level of Independence Needs assistance  Gait / Transfers Assistance Needed used cane for mobility but requires hands on assist and min assist to transfer at times.  Also walks flexed forward due to chronic back pain.   ADL's / Homemaking Assistance Needed some assist for bathing and dressing, steadying assist for toileting   Comments hx of B arm fxs and limited ROM UEs  Communication  Communication Expressive difficulties  Pain Assessment  Pain Assessment Faces  Faces Pain Scale 8  Pain Location back, chronic  Pain Descriptors / Indicators Grimacing;Guarding  Pain Intervention(s) Limited activity within patient's tolerance;Monitored during session;Repositioned  Cognition  Arousal/Alertness Awake/alert  Behavior During Therapy Flat affect  Overall Cognitive Status Difficult to assess  General Comments Pt seems to recognize her husband and daughter, responding positively to their familiarity.  She was able to follow commands well in both arms, but interestingly enough she had significant difficulty following my commands in her legs (to bend knees, specifically).  Cognition difficult to assess due to communication difficulties.  Followed tactile initiation for mobility until pain set in, and then resisted.   Difficult to assess due to Impaired communication  Upper Extremity Assessment  Upper Extremity Assessment Defer to OT evaluation  Lower Extremity Assessment  Lower Extremity Assessment RLE deficits/detail;LLE deficits/detail  RLE Deficits / Details motor strength seems to be intact bil, however, motor planning may be difficult, seems to do automatic movement more readily than when asked to bend legs, etc.  pt responding to touch  bil legs, but difficult to test sensation.  No significant buckling with  standing or side stepping.   RLE Sensation WNL  RLE Coordination WNL  LLE Deficits / Details motor strength seems to be intact bil, however, motor planning may be difficult, seems to do automatic movement more readily than when asked to bend legs, etc.  pt responding to touch bil legs, but difficult to test sensation.  No significant buckling with standing or side stepping.   LLE Sensation WNL  LLE Coordination WNL  Cervical / Trunk Assessment  Cervical / Trunk Assessment Other exceptions  Cervical / Trunk Exceptions chronic back pain, takes narcotics daily, per daughter forward flexed posture is her baseline.   Bed Mobility  Overal bed mobility Needs Assistance  Bed Mobility Rolling;Sidelying to Sit;Sit to Supine  Rolling +2 for physical assistance;Min assist  Sidelying to sit Max assist;+2 for physical assistance  Sit to supine Max assist;+2 for physical assistance  General bed mobility comments Pt readily rolled to her left side with min assist to initiate movement, husband assisted in transition from side lying to sitting up EOB as he is used to helping her at home and she seemed less resistive to movement with him helping.  Max assist to support trunk and lift legs to return to supine in bed.   Transfers  Overall transfer level Needs assistance  Equipment used 2 person hand held assist  Transfers Sit to/from Stand  Sit to Stand +2 physical assistance;Mod assist  General transfer comment Two person mod hand held assist to stand EOB for a few minutes while wet sheets were changed. She came up on her feet well with anticipated forward flexed posture (her baseline).  No signs of LE buckling  Ambulation/Gait  Ambulation/Gait assistance Mod assist;+2 physical assistance  Gait Distance (Feet) 3 Feet  Assistive device 2 person hand held assist  Gait Pattern/deviations Step-to pattern  General Gait Details side [facility] for better positioning when returning to supine.    Modified Rankin  (Stroke Patients Only)  Pre-Morbid Rankin Score 4  Modified Rankin 4  Balance  Overall balance assessment Needs assistance  Sitting-balance support Feet supported;Bilateral upper extremity supported  Sitting balance-Leahy Scale Poor  Sitting balance - Comments needs min assist EOB, pt in pain in sitting  Standing balance support Bilateral upper extremity supported  Standing balance-Leahy Scale Poor  Standing balance comment needs two person external support in standing.   General Comments  General comments (skin integrity, edema, etc.) Pt limited by willingness due to significant back pain with mobility.  Per daughter, she has not had her normal pain meds she is on at home.  Husband is very hands on in helping with her care now and PTA.  PT - End of Session  Equipment Utilized During Treatment Gait belt  Activity Tolerance Patient limited by pain  Patient left in bed;with bed alarm set;with call bell/phone within reach;with family/visitor present  Nurse Communication Mobility status  PT Assessment  PT Recommendation/Assessment Patient needs continued PT services  PT Visit Diagnosis Difficulty in walking, not elsewhere classified (R26.2);Other symptoms and signs involving the nervous system (R29.898);Repeated falls (R29.6)  PT Problem List Decreased activity tolerance;Decreased balance;Decreased mobility;Decreased cognition;Decreased knowledge of use of DME;Decreased knowledge of precautions;Decreased safety awareness;Pain  PT Plan  PT Frequency (ACUTE ONLY) Min 4X/week  PT Treatment/Interventions (ACUTE ONLY) DME instruction;Gait training;Stair training;Functional mobility training;Therapeutic activities;Balance training;Therapeutic exercise;Neuromuscular re-education;Cognitive remediation;Patient/family education;Modalities  AM-PAC PT "6 Clicks" Mobility Outcome Measure (Version 2)  Help needed  turning from your back to your side while in a flat bed without using bedrails? 2  Help needed  moving from lying on your back to sitting on the side of a flat bed without using bedrails? 1  Help needed moving to and from a bed to a chair (including a wheelchair)? 2  Help needed standing up from a chair using your arms (e.g., wheelchair or bedside chair)? 2  Help needed to walk in hospital room? 1  Help needed climbing 3-5 steps with a railing?  1  6 Click Score 9  Consider Recommendation of Discharge To: CIR/SNF/LTACH  PT Recommendation  Recommendations for Other Services Rehab consult  Follow Up Recommendations CIR  PT equipment Rolling walker with 5" wheels;Wheelchair (measurements PT);Wheelchair cushion (measurements PT)  Individuals Consulted  Consulted and Agree with Results and Recommendations Patient;Family member/caregiver  Family Member Consulted husband and daughter  Acute Rehab PT Goals  Patient Stated Goal she wants to be able to return home  PT Goal Formulation With patient/family  Time For Goal Achievement 10/19/18  Potential to Achieve Goals Good  PT Time Calculation  PT Start Time (ACUTE ONLY) 1206  PT Stop Time (ACUTE ONLY) 1236  PT Time Calculation (min) (ACUTE ONLY) 30 min  PT General Charges  $$ ACUTE PT VISIT 1 Visit  PT Evaluation  $PT Eval Moderate Complexity 1 Mod  Written Expression  Dominant Hand Right

## 2018-10-05 NOTE — Progress Notes (Signed)
Rehab Admissions Coordinator Note:  Patient was screened by Clois DupesBoyette, Johari Bennetts Godwin for appropriateness for an Inpatient Acute Rehab Consult per PT and SLP recommendations.  At this time, we are recommending Inpatient Rehab consult.  Ottie GlazierBarbara Lanier Felty, RN, MSN Rehab Admissions Coordinator [phone] 10/05/2018 3:02 PM

## 2018-10-05 NOTE — Progress Notes (Signed)
Referring Physician(s): CODE STROKE- Aroor, Dara Lords  Supervising Physician: Julieanne Cotton  Patient Status:  [facility] - In-pt  Chief Complaint: None  Subjective:  Left MCA M2, M3, 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 Corliss Skains. Patient awake and alert laying in bed with no complaints at this time. Accompanied by daughter at bedside. Demonstrates expressive aphasia that has slightly improved since yesterday- she was able to correctly tell the time on the clock. Can spontaneously move all extremities. Right groin incision c/d/i.   Allergies: Cozaar; Hydralazine; Hyzaar [losartan potassium-hctz]; Sulfa drugs cross reactors; and Lisinopril  Medications: Prior to Admission medications   Medication Sig Start Date End Date Taking? Authorizing Provider  acetaminophen (TYLENOL) 500 MG tablet Take 1,000 mg by mouth every 6 (six) hours as needed for mild pain.   Yes [provider]  amLODipine (NORVASC) 2.5 MG tablet Take 2.5 mg by mouth daily. 08/29/18  Yes [provider]  atorvastatin (LIPITOR) 20 MG tablet TAKE 1/2 (ONE-HALF) TABLET BY MOUTH ONCE DAILY IN THE MORNING Patient taking differently: Take 10 mg by mouth daily.  08/29/18  Yes Chilton Si, MD  diazepam (VALIUM) 2 MG tablet TAKE 1 TABLET BY MOUTH TWICE DAILY AS NEEDED FOR ANXIETY Patient taking differently: Take 2 mg by mouth 2 (two) times daily as needed for anxiety.  03/08/18  Yes Sheliah Hatch, MD  furosemide (LASIX) 20 MG tablet Take 10 mg by mouth as needed for fluid or edema.   Yes [provider]  levothyroxine (SYNTHROID, LEVOTHROID) 25 MCG tablet TAKE 1 TABLET BY MOUTH ONCE DAILY BEFORE  BREAKFAST Patient taking differently: Take 25 mcg by mouth daily before breakfast.  07/11/18  Yes Sheliah Hatch, MD  nadolol (CORGARD) 20 MG tablet TAKE 1/2 (ONE-HALF) TABLET BY MOUTH ONCE DAILY Patient taking differently: Take 10 mg by mouth  daily.  07/26/18  Yes Chilton Si, MD  nitroGLYCERIN (NITROSTAT) 0.4 MG SL tablet Place 0.4 mg under the tongue every 5 (five) minutes as needed (chest pain).    Yes [provider]  oxyCODONE-acetaminophen (PERCOCET/ROXICET) 5-325 MG tablet Take 1 tablet by mouth every 4 (four) hours as needed for severe pain.    Yes [provider]  potassium chloride (K-DUR,KLOR-CON) 10 MEQ tablet TAKE 1 TABLET BY MOUTH ONCE DAILY Patient taking differently: Take 10 mEq by mouth daily.  06/21/18  Yes Chilton Si, MD  warfarin (COUMADIN) 5 MG tablet TAKE 1/2 TO 1 (ONE-HALF TO ONE) TABLET BY MOUTH ONCE DAILY AS DIRECTED BY  COUMADIN  CLINIC Patient taking differently: Take 2.5-5 mg by mouth every morning. Take 2.5mg  daily on Sun/Tues/Wed/Thurs/Sat and 5mg  on Mon & Fri. 07/20/18  Yes Chilton Si, MD     Vital Signs: BP (!) 147/99   Pulse 91   Temp 98.7 F (37.1 C) (Oral)   Resp (!) 25   Ht 5\' 2"  (1.575 m)   Wt 115 lb 4.8 oz (52.3 kg)   SpO2 100%   BMI 21.09 kg/m   Physical Exam Vitals signs and nursing note reviewed.  Constitutional:      General: She is not in acute distress.    Appearance: Normal appearance.  Pulmonary:     Effort: Pulmonary effort is normal. No respiratory distress.  Skin:    General: Skin is warm and dry.     Comments: Right groin incision soft without active bleeding or hematoma.  Neurological:     Mental Status: She is alert.  Comments: Alert, awake, and oriented to person and time but unable to tell place due to difficulty with word finding. Demonstrates expressive aphasia that has improved since yesterday (she was able to read clock time correctly). PERRL bilaterally. EOMs intact bilaterally without nystagmus or subjective diplopia. Visual fields not assessed. No facial asymmetry. Tongue midline. Can spontaneously move all extremities. Pronator drift not assessed. Fine motor and coordination not assessed. Gait not  assessed. Romberg not assessed. Heel to toe not assessed. Distal pulses palpable bilaterally with Doppler.  Psychiatric:        Mood and Affect: Mood normal.        Behavior: Behavior normal.        Thought Content: Thought content normal.        Judgment: Judgment normal.     Imaging: Ct Angio Head W Or Wo Contrast  Result Date: 10/03/2018 CLINICAL DATA:  Focal neuro deficit with stroke suspected-deficit is not specified in the history or charted. EXAM: CT ANGIOGRAPHY HEAD AND NECK TECHNIQUE: Multidetector CT imaging of the head and neck was performed using the standard protocol during bolus administration of intravenous contrast. Multiplanar CT image reconstructions and MIPs were obtained to evaluate the vascular anatomy. Carotid stenosis measurements (when applicable) are obtained utilizing NASCET criteria, using the distal internal carotid diameter as the denominator. CONTRAST:  50mL ISOVUE-370 IOPAMIDOL (ISOVUE-370) INJECTION 76% COMPARISON:  Head CT from earlier today. FINDINGS: CTA NECK FINDINGS Aortic arch: The arch and great vessel origins are not covered. Right carotid system: Moderate calcified plaque about the common carotid bifurcation without ulceration or flow limiting stenosis. Left carotid system: Bulky calcified plaque at the ICA bulb with no flow limiting stenosis or ulceration. No ICA beading. Vertebral arteries: The subclavian origins are not covered. There is subclavian atherosclerosis with 50% narrowing on the left. Mild dominance of the right vertebral artery. The vertebral arteries are tortuous without beading or flow limiting stenosis. Skeleton: Usual degenerative changes. No acute or aggressive finding. Other neck: No evident mass or inflammation. Upper chest: No acute finding Review of the MIP images confirms the above findings CTA HEAD FINDINGS Anterior circulation: Left M2 branch occlusion with paucity of downstream vessels. Calcified plaque on the carotid siphons. No  proximal flow limiting stenosis. Negative for aneurysm. Posterior circulation: Vertebrobasilar arteries are smooth and widely patent. Hypoplastic left P1 and aplastic right P1 segments. No branch occlusion or flow limiting stenosis. Venous sinuses: Negative Anatomic variants: As above Delayed phase: Not obtained in the emergent setting Review of the MIP images confirms the above findings Critical Value/emergent results were called by telephone at the time of interpretation on 10/03/2018 at 4:25 pm to Dr. Felicie Morn , who verbally acknowledged these results. IMPRESSION: 1. Left M2 branch occlusion. 2. Atherosclerosis but no high-grade stenosis or embolic source identified in the neck. 3. 50% narrowing of the proximal left subclavian. 4. The arch and great vessel ostia were not covered in the field of view. Electronically Signed   By: Marnee Spring M.D.   On: 10/03/2018 16:28   Ct Angio Neck W Or Wo Contrast  Result Date: 10/03/2018 CLINICAL DATA:  Focal neuro deficit with stroke suspected-deficit is not specified in the history or charted. EXAM: CT ANGIOGRAPHY HEAD AND NECK TECHNIQUE: Multidetector CT imaging of the head and neck was performed using the standard protocol during bolus administration of intravenous contrast. Multiplanar CT image reconstructions and MIPs were obtained to evaluate the vascular anatomy. Carotid stenosis measurements (when applicable) are obtained utilizing NASCET criteria, using  the distal internal carotid diameter as the denominator. CONTRAST:  50mL ISOVUE-370 IOPAMIDOL (ISOVUE-370) INJECTION 76% COMPARISON:  Head CT from earlier today. FINDINGS: CTA NECK FINDINGS Aortic arch: The arch and great vessel origins are not covered. Right carotid system: Moderate calcified plaque about the common carotid bifurcation without ulceration or flow limiting stenosis. Left carotid system: Bulky calcified plaque at the ICA bulb with no flow limiting stenosis or ulceration. No ICA beading.  Vertebral arteries: The subclavian origins are not covered. There is subclavian atherosclerosis with 50% narrowing on the left. Mild dominance of the right vertebral artery. The vertebral arteries are tortuous without beading or flow limiting stenosis. Skeleton: Usual degenerative changes. No acute or aggressive finding. Other neck: No evident mass or inflammation. Upper chest: No acute finding Review of the MIP images confirms the above findings CTA HEAD FINDINGS Anterior circulation: Left M2 branch occlusion with paucity of downstream vessels. Calcified plaque on the carotid siphons. No proximal flow limiting stenosis. Negative for aneurysm. Posterior circulation: Vertebrobasilar arteries are smooth and widely patent. Hypoplastic left P1 and aplastic right P1 segments. No branch occlusion or flow limiting stenosis. Venous sinuses: Negative Anatomic variants: As above Delayed phase: Not obtained in the emergent setting Review of the MIP images confirms the above findings Critical Value/emergent results were called by telephone at the time of interpretation on 10/03/2018 at 4:25 pm to Dr. Felicie MornAVID SMITH , who verbally acknowledged these results. IMPRESSION: 1. Left M2 branch occlusion. 2. Atherosclerosis but no high-grade stenosis or embolic source identified in the neck. 3. 50% narrowing of the proximal left subclavian. 4. The arch and great vessel ostia were not covered in the field of view. Electronically Signed   By: Marnee SpringJonathon  Watts M.D.   On: 10/03/2018 16:28   Mr Brain Wo Contrast  Result Date: 10/04/2018 CLINICAL DATA:  Stroke.  Aphasia left-sided weakness EXAM: MRI HEAD WITHOUT CONTRAST TECHNIQUE: Multiplanar, multiecho pulse sequences of the brain and surrounding structures were obtained without intravenous contrast. COMPARISON:  CTA 10/03/2018 FINDINGS: Brain: Acute infarct in the left parietal lobe involving cortex and white matter. Small areas of acute infarct extend into the posterior insula and left  frontal white matter. Moderate atrophy. Chronic ischemic changes in the pons bilaterally and in the right cerebellum. Chronic ischemic changes throughout the cerebral white matter bilaterally. Negative for hemorrhage mass or midline shift. Vascular: Negative for hyperdense vessel Skull and upper cervical spine: Negative Sinuses/Orbits: Mild mucosal edema paranasal sinuses. Bilateral cataract surgery Other: None IMPRESSION: Acute infarct left MCA territory. Largest area infarction is in the left parietal lobe with extension into the insula and left frontal lobe. No associated hemorrhage Atrophy and chronic ischemic changes as above. Electronically Signed   By: Marlan Palauharles  Clark M.D.   On: 10/04/2018 13:48   Dg Chest Port 1 View  Result Date: 10/04/2018 CLINICAL DATA:  Stroke EXAM: PORTABLE CHEST 1 VIEW COMPARISON:  01/09/2015 FINDINGS: Borderline mild cardiomegaly. Aortic atherosclerosis. Bronchovascular crowding versus vascular congestion, no pulmonary edema. Biapical pleuroparenchymal scarring. No consolidation, pleural effusion, or pneumothorax. No acute osseous abnormalities are seen. Kyphoplasty in the thoracic vertebra. IMPRESSION: Borderline mild cardiomegaly. Bronchovascular crowding versus vascular congestion. Electronically Signed   By: Narda RutherfordMelanie  Sanford M.D.   On: 10/04/2018 03:03   Ct Head Code Stroke Wo Contrast  Result Date: 10/03/2018 CLINICAL DATA:  Code stroke. 84 y/o F; Focal neuro deficit, < 6 hrs, stroke suspected. EXAM: CT HEAD WITHOUT CONTRAST TECHNIQUE: Contiguous axial images were obtained from the base of the skull through  the vertex without intravenous contrast. COMPARISON:  09/21/2018 CT head FINDINGS: Brain: No evidence of acute infarction, hemorrhage, hydrocephalus, extra-axial collection or mass lesion/mass effect. Stable chronic infarctions within the right lateral cerebellar hemisphere. Stable white matter hypodensities compatible with chronic microvascular ischemic changes and  stable volume loss of the brain. Stable small scattered hypodensities within the basal ganglia and left pons, likely represent chronic lacunar infarctions. Vascular: Calcific atherosclerosis of carotid siphons. No hyperdense vessel identified. Skull: Normal. Negative for fracture or focal lesion. Sinuses/Orbits: No acute finding. Other: None. ASPECTS [facility] Stroke Program Early CT Score) - Ganglionic level infarction (caudate, lentiform nuclei, internal capsule, insula, M1-M3 cortex): 7 - Supraganglionic infarction (M4-M6 cortex): 3 Total score (0-10 with 10 being normal): 10 IMPRESSION: 1. No acute intracranial abnormality identified. 2. ASPECTS is 10. 3. Stable chronic microvascular ischemic changes and volume loss of the brain. Stable small chronic infarctions in right lateral cerebellar hemisphere, left pons, and bilateral basal ganglia. These results were communicated to Dr. Laurence Slate at 4:11 pmon 12/16/2019by text page via the [facility] messaging system. Electronically Signed   By: Mitzi Hansen M.D.   On: 10/03/2018 16:12    Labs:  CBC: Recent Labs    05/11/18 1454 10/03/18 1555 10/03/18 1604 10/04/18 0856 10/05/18 0410  WBC 7.4 10.9*  --  14.2* 10.1  HGB 14.6 13.7 14.6 11.5* 9.7*  HCT 44.3 43.5 43.0 37.7 29.8*  PLT 230 300  --  285 263    COAGS: Recent Labs    09/01/18 1520 10/03/18 1555 10/04/18 0856 10/05/18 0410  INR 2.8 2.52 3.27 2.99  APTT  --  35  --   --     BMP: Recent Labs    10/13/17 1658 04/12/18 1609 10/03/18 1555 10/03/18 1604 10/04/18 0856 10/05/18 0410  NA 140  --  135 135 139 139  K 4.1  --  4.7 4.7 4.4 3.8  CL 104  --  101 102 108 106  CO2 27  --  25  --  20* 21*  GLUCOSE 118*  --  163* 161* 135* 114*  BUN 17  --  34* 36* 21 16  CALCIUM 8.7 9.8 8.8*  --  7.7* 7.3*  CREATININE 0.83  --  1.79* 1.60* 1.51* 1.44*  GFRNONAA  --   --  25*  --  31* 33*  GFRAA  --   --  29*  --  36* 38*    LIVER FUNCTION TESTS: Recent Labs    10/13/17 1658  10/03/18 1555  BILITOT 0.8 0.6  AST 16 22  ALT 7 16  ALKPHOS  --  52  PROT 6.1 6.4*  ALBUMIN  --  3.2*    Assessment and Plan:  Left MCA M2, M3, 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 Corliss Skains. Patient's condition slightly improved- still with expressive aphasia but it has improved since yesterday and can spontaneously move all extremities. Right groin incision stable. Appreciate and agree with neurology management. IR to follow.   Electronically Signed: Elwin Mocha, PA-C 10/05/2018, 10:22 AM   I spent a total of 25 Minutes at the the patient's bedside AND on the patient's hospital floor or unit, greater than 50% of which was counseling/coordinating care for left MCA M2, M3, and M4 occlusions s/p attempted revascularization.

## 2018-10-05 NOTE — Progress Notes (Signed)
STROKE TEAM PROGRESS NOTE   SUBJECTIVE (INTERVAL HISTORY) Her daughter is at the bedside. Pt is lying in bed for lunch. She took only a couple of bites. She still has partial global aphasia but no gaze preference or motor deficit. MRI showed left MCA patchy and small infarcts. INR 2.99 today.    OBJECTIVE Temp:  [98.1 F (36.7 C)-98.9 F (37.2 C)] 98.7 F (37.1 C) (12/18 0800) Pulse Rate:  [31-115] 91 (12/18 1100) Cardiac Rhythm: Atrial fibrillation (12/18 0800) Resp:  [17-28] 21 (12/18 1100) BP: (91-154)/(46-109) 134/74 (12/18 1100) SpO2:  [96 %-100 %] 100 % (12/18 1100) Weight:  [52.3 kg] 52.3 kg (12/17 1752)  Recent Labs  Lab 10/03/18 1555 10/03/18 1922  GLUCAP 153* 113*   Recent Labs  Lab 10/03/18 1555 10/03/18 1604 10/04/18 0856 10/05/18 0410  NA 135 135 139 139  K 4.7 4.7 4.4 3.8  CL 101 102 108 106  CO2 25  --  20* 21*  GLUCOSE 163* 161* 135* 114*  BUN 34* 36* 21 16  CREATININE 1.79* 1.60* 1.51* 1.44*  CALCIUM 8.8*  --  7.7* 7.3*   Recent Labs  Lab 10/03/18 1555  AST 22  ALT 16  ALKPHOS 52  BILITOT 0.6  PROT 6.4*  ALBUMIN 3.2*   Recent Labs  Lab 10/03/18 1555 10/03/18 1604 10/04/18 0856 10/05/18 0410  WBC 10.9*  --  14.2* 10.1  NEUTROABS 7.5  --   --   --   HGB 13.7 14.6 11.5* 9.7*  HCT 43.5 43.0 37.7 29.8*  MCV 98.9  --  100.3* 98.0  PLT 300  --  285 263   No results for input(s): CKTOTAL, CKMB, CKMBINDEX, TROPONINI in the last 168 hours. Recent Labs    10/03/18 1555 10/04/18 0856 10/05/18 0410  LABPROT 26.9* 32.9* 30.6*  INR 2.52 3.27 2.99   No results for input(s): COLORURINE, LABSPEC, PHURINE, GLUCOSEU, HGBUR, BILIRUBINUR, KETONESUR, PROTEINUR, UROBILINOGEN, NITRITE, LEUKOCYTESUR in the last 72 hours.  Invalid input(s): APPERANCEUR     Component Value Date/Time   CHOL 103 10/04/2018 0510   TRIG 88 10/04/2018 0510   HDL 26 (L) 10/04/2018 0510   CHOLHDL 4.0 10/04/2018 0510   VLDL 18 10/04/2018 0510   LDLCALC 59 10/04/2018 0510    LDLCALC 78 10/13/2017 1658   Lab Results  Component Value Date   HGBA1C 5.2 10/04/2018   No results found for: LABOPIA, COCAINSCRNUR, LABBENZ, AMPHETMU, THCU, LABBARB  No results for input(s): ETH in the last 168 hours.  I have personally reviewed the radiological images below and agree with the radiology interpretations.  Ct Angio Head W Or Wo Contrast  Result Date: 10/03/2018 CLINICAL DATA:  Focal neuro deficit with stroke suspected-deficit is not specified in the history or charted. EXAM: CT ANGIOGRAPHY HEAD AND NECK TECHNIQUE: Multidetector CT imaging of the head and neck was performed using the standard protocol during bolus administration of intravenous contrast. Multiplanar CT image reconstructions and MIPs were obtained to evaluate the vascular anatomy. Carotid stenosis measurements (when applicable) are obtained utilizing NASCET criteria, using the distal internal carotid diameter as the denominator. CONTRAST:  18m ISOVUE-370 IOPAMIDOL (ISOVUE-370) INJECTION 76% COMPARISON:  Head CT from earlier today. FINDINGS: CTA NECK FINDINGS Aortic arch: The arch and great vessel origins are not covered. Right carotid system: Moderate calcified plaque about the common carotid bifurcation without ulceration or flow limiting stenosis. Left carotid system: Bulky calcified plaque at the ICA bulb with no flow limiting stenosis or ulceration. No ICA beading. Vertebral  arteries: The subclavian origins are not covered. There is subclavian atherosclerosis with 50% narrowing on the left. Mild dominance of the right vertebral artery. The vertebral arteries are tortuous without beading or flow limiting stenosis. Skeleton: Usual degenerative changes. No acute or aggressive finding. Other neck: No evident mass or inflammation. Upper chest: No acute finding Review of the MIP images confirms the above findings CTA HEAD FINDINGS Anterior circulation: Left M2 branch occlusion with paucity of downstream vessels.  Calcified plaque on the carotid siphons. No proximal flow limiting stenosis. Negative for aneurysm. Posterior circulation: Vertebrobasilar arteries are smooth and widely patent. Hypoplastic left P1 and aplastic right P1 segments. No branch occlusion or flow limiting stenosis. Venous sinuses: Negative Anatomic variants: As above Delayed phase: Not obtained in the emergent setting Review of the MIP images confirms the above findings Critical Value/emergent results were called by telephone at the time of interpretation on 10/03/2018 at 4:25 pm to Dr. Etta Quill , who verbally acknowledged these results. IMPRESSION: 1. Left M2 branch occlusion. 2. Atherosclerosis but no high-grade stenosis or embolic source identified in the neck. 3. 50% narrowing of the proximal left subclavian. 4. The arch and great vessel ostia were not covered in the field of view. Electronically Signed   By: Monte Fantasia M.D.   On: 10/03/2018 16:28   Ct Head Wo Contrast  Result Date: 09/22/2018 CLINICAL DATA:  Confusion EXAM: CT HEAD WITHOUT CONTRAST TECHNIQUE: Contiguous axial images were obtained from the base of the skull through the vertex without intravenous contrast. COMPARISON:  03/31/2017 . FINDINGS: Brain: Global atrophy. Prominent chronic ischemic changes in the periventricular white matter. Encephalomalacia in the right cerebellar hemisphere. No mass effect, midline shift, or acute hemorrhage. Vascular: No hyperdense vessel or unexpected calcification. Skull: Cranium is intact. Sinuses/Orbits: Visualized paranasal sinuses and mastoid air cells are clear. Visualized orbits are within normal limits. Other: Noncontributory IMPRESSION: No acute intracranial pathology.  Chronic changes. Electronically Signed   By: Marybelle Killings M.D.   On: 09/22/2018 07:47   Ct Angio Neck W Or Wo Contrast  Result Date: 10/03/2018 CLINICAL DATA:  Focal neuro deficit with stroke suspected-deficit is not specified in the history or charted. EXAM: CT  ANGIOGRAPHY HEAD AND NECK TECHNIQUE: Multidetector CT imaging of the head and neck was performed using the standard protocol during bolus administration of intravenous contrast. Multiplanar CT image reconstructions and MIPs were obtained to evaluate the vascular anatomy. Carotid stenosis measurements (when applicable) are obtained utilizing NASCET criteria, using the distal internal carotid diameter as the denominator. CONTRAST:  88m ISOVUE-370 IOPAMIDOL (ISOVUE-370) INJECTION 76% COMPARISON:  Head CT from earlier today. FINDINGS: CTA NECK FINDINGS Aortic arch: The arch and great vessel origins are not covered. Right carotid system: Moderate calcified plaque about the common carotid bifurcation without ulceration or flow limiting stenosis. Left carotid system: Bulky calcified plaque at the ICA bulb with no flow limiting stenosis or ulceration. No ICA beading. Vertebral arteries: The subclavian origins are not covered. There is subclavian atherosclerosis with 50% narrowing on the left. Mild dominance of the right vertebral artery. The vertebral arteries are tortuous without beading or flow limiting stenosis. Skeleton: Usual degenerative changes. No acute or aggressive finding. Other neck: No evident mass or inflammation. Upper chest: No acute finding Review of the MIP images confirms the above findings CTA HEAD FINDINGS Anterior circulation: Left M2 branch occlusion with paucity of downstream vessels. Calcified plaque on the carotid siphons. No proximal flow limiting stenosis. Negative for aneurysm. Posterior circulation: Vertebrobasilar arteries are  smooth and widely patent. Hypoplastic left P1 and aplastic right P1 segments. No branch occlusion or flow limiting stenosis. Venous sinuses: Negative Anatomic variants: As above Delayed phase: Not obtained in the emergent setting Review of the MIP images confirms the above findings Critical Value/emergent results were called by telephone at the time of interpretation on  10/03/2018 at 4:25 pm to Dr. Etta Quill , who verbally acknowledged these results. IMPRESSION: 1. Left M2 branch occlusion. 2. Atherosclerosis but no high-grade stenosis or embolic source identified in the neck. 3. 50% narrowing of the proximal left subclavian. 4. The arch and great vessel ostia were not covered in the field of view. Electronically Signed   By: Monte Fantasia M.D.   On: 10/03/2018 16:28   Mr Brain Wo Contrast  Result Date: 10/04/2018 CLINICAL DATA:  Stroke.  Aphasia left-sided weakness EXAM: MRI HEAD WITHOUT CONTRAST TECHNIQUE: Multiplanar, multiecho pulse sequences of the brain and surrounding structures were obtained without intravenous contrast. COMPARISON:  CTA 10/03/2018 FINDINGS: Brain: Acute infarct in the left parietal lobe involving cortex and white matter. Small areas of acute infarct extend into the posterior insula and left frontal white matter. Moderate atrophy. Chronic ischemic changes in the pons bilaterally and in the right cerebellum. Chronic ischemic changes throughout the cerebral white matter bilaterally. Negative for hemorrhage mass or midline shift. Vascular: Negative for hyperdense vessel Skull and upper cervical spine: Negative Sinuses/Orbits: Mild mucosal edema paranasal sinuses. Bilateral cataract surgery Other: None IMPRESSION: Acute infarct left MCA territory. Largest area infarction is in the left parietal lobe with extension into the insula and left frontal lobe. No associated hemorrhage Atrophy and chronic ischemic changes as above. Electronically Signed   By: Franchot Gallo M.D.   On: 10/04/2018 13:48   Dg Chest Port 1 View  Result Date: 10/04/2018 CLINICAL DATA:  Stroke EXAM: PORTABLE CHEST 1 VIEW COMPARISON:  01/09/2015 FINDINGS: Borderline mild cardiomegaly. Aortic atherosclerosis. Bronchovascular crowding versus vascular congestion, no pulmonary edema. Biapical pleuroparenchymal scarring. No consolidation, pleural effusion, or pneumothorax. No acute  osseous abnormalities are seen. Kyphoplasty in the thoracic vertebra. IMPRESSION: Borderline mild cardiomegaly. Bronchovascular crowding versus vascular congestion. Electronically Signed   By: Keith Rake M.D.   On: 10/04/2018 03:03   Ct Head Code Stroke Wo Contrast  Result Date: 10/03/2018 CLINICAL DATA:  Code stroke. 84 y/o F; Focal neuro deficit, < 6 hrs, stroke suspected. EXAM: CT HEAD WITHOUT CONTRAST TECHNIQUE: Contiguous axial images were obtained from the base of the skull through the vertex without intravenous contrast. COMPARISON:  09/21/2018 CT head FINDINGS: Brain: No evidence of acute infarction, hemorrhage, hydrocephalus, extra-axial collection or mass lesion/mass effect. Stable chronic infarctions within the right lateral cerebellar hemisphere. Stable white matter hypodensities compatible with chronic microvascular ischemic changes and stable volume loss of the brain. Stable small scattered hypodensities within the basal ganglia and left pons, likely represent chronic lacunar infarctions. Vascular: Calcific atherosclerosis of carotid siphons. No hyperdense vessel identified. Skull: Normal. Negative for fracture or focal lesion. Sinuses/Orbits: No acute finding. Other: None. ASPECTS [facility] [place] Stroke Program Early CT Score) - Ganglionic level infarction (caudate, lentiform nuclei, internal capsule, insula, M1-M3 cortex): 7 - Supraganglionic infarction (M4-M6 cortex): 3 Total score (0-10 with 10 being normal): 10 IMPRESSION: 1. No acute intracranial abnormality identified. 2. ASPECTS is 10. 3. Stable chronic microvascular ischemic changes and volume loss of the brain. Stable small chronic infarctions in right lateral cerebellar hemisphere, left pons, and bilateral basal ganglia. These results were communicated to Dr. Lorraine Lax at [facility] 12/16/2019by  text page via the [facility] messaging system. Electronically Signed   By: Kristine Garbe M.D.   On: 10/03/2018 16:12   Ir Angio Intra  Extracran Sel Com Carotid Innominate Uni L Mod Sed  Result Date: 10/05/2018 INDICATION: Acute onset of aphasia and right-sided weakness. Occluded left middle cerebral artery inferior division distal M2 segment EXAM: 1. EMERGENT LARGE VESSEL OCCLUSION THROMBOLYSIS (anterior CIRCULATION) COMPARISON:  CT angiogram of the head and neck of 10/03/2018. MEDICATIONS: Ancef 2 g IV antibiotic was administered within 1 hour of the procedure. ANESTHESIA/SEDATION: General anesthesia. CONTRAST:  Isovue 300 approximately 100 mL. FLUOROSCOPY TIME:  Fluoroscopy Time: 48 minutes 18 seconds (966.9 mGy). COMPLICATIONS: None immediate. TECHNIQUE: Following a full explanation of the procedure along with the potential associated complications, an informed witnessed consent was obtained from the patient's husband and daughter. The risks of intracranial hemorrhage of 10%, worsening neurological deficit, ventilator dependency, death and inability to revascularize were all reviewed in detail with the patient's family. The patient was then put under general anesthesia by the Department of Anesthesiology at [facility]. The right groin was prepped and draped in the usual sterile fashion. Thereafter using modified Seldinger technique, transfemoral access into the right common femoral artery was obtained without difficulty. Over a 0.035 inch guidewire a 5 French Pinnacle sheath was inserted. Through this, and also over a 0.035 inch guidewire a 5 French Simmons 2 diagnostic catheter was advanced to the aortic arch region and selectively positioned in the left common carotid artery. FINDINGS: The left common carotid arteriogram demonstrates an acute origin of the left common carotid artery in addition to calcification, and also aortic arch tortuosity. The left common carotid bifurcation demonstrates approximately 50-70% stenosis at the origin of the left external carotid artery. Its branches are normally opacified. The left internal  carotid artery just distal to the bulb has approximately 30% stenosis by the NASCET criteria. No evidence of acute ulcerations, or of intraluminal filling defects are seen. More distally, the cervical petrous junctions are widely patent. There is a fusiform dilatation of the caval cavernous segment of the left internal carotid artery. The supraclinoid segment is widely patent. The left posterior communicating artery is seen opacifying the left posterior cerebral artery distribution. The left middle cerebral artery and the left anterior cerebral artery opacify into the capillary and venous phases. There was truncation and occlusion of the mid to distal third of the M2 segment of the inferior division of the left middle cerebral artery. A moderate-sized area of hypoperfusion is noted in the cortical subcortical area of the left parietal region. PROCEDURE: The diagnostic Simmons 2 catheter in the left common carotid artery was then exchanged over a 0.035 inch 300 cm Rosen exchange guidewire for an 8 French 55 cm Brite tip neurovascular sheath using biplane roadmap technique and constant fluoroscopic guidance. Good aspiration obtained from the side port of the neurovascular sheath. This was then connected to continuous heparinized saline infusion. Over the [facility] exchange guidewire, an 85 cm 8 [place] FlowGate balloon guide catheter which had been prepped with 50% contrast and 50% heparinized saline infusion was advanced and positioned just distal to the origin of the left carotid artery. Further advancement was met with significant flow resistance and herniation of the system into the aortic arch because of the tortuosity. Further attempts were then made using an 8 [place] Neuron Max guide sheath. A triaxial system was also utilized using a 4 French 120 cm slip guide catheter over which attempts were made to advance  the Neuron Max neurovascular sheath, and also with the 6 French 85 cm Cook shuttle sheath and also the 8  French 85 cm FlowGate guide catheter. After multiple attempts to access the more distal anterior circulation, with the tortuosity of the aortic arch, and the acuteness of the origin of the left common carotid artery it was decided to perform a diagnostic arteriogram using a 5 [place] JB 3 catheter in the left common carotid artery. This demonstrated the extracranial and intracranial portions of the left internal carotid artery to be unchanged and widely patent. The left middle cerebral artery now demonstrated that the filling defect in the M2 region of the inferior division of the left middle cerebral artery had moved more distally making access even more difficult. It was, therefore, decided to terminate the procedure. A final control arteriogram performed demonstrated the left external carotid artery and the internal carotid artery to be unchanged. The intracranial portion of the anterior circulation also demonstrated wide patency of the anterior cerebral artery and the left middle cerebral artery distribution apart from 2 small focal areas of occlusion of the M3 M4 regions of the anterior perisylvian branches, and also the filling defect in the inferior division of the left middle cerebral artery M2 M3 region which had now moved more distally. Throughout the procedure, the patient's blood pressure and neurological status remained stable. The 8 French 55 cm Brite tip neurovascular sheath was then exchanged over a J-tip guidewire for an 8 [place] Pinnacle sheath. This in turn was then removed with the successful hemostasis manually in the right groin puncture site over 30 minutes. The patient's distal pulses in both feet were Dopplerable. Skin on the feet was warm bilaterally. The patient's general anesthesia was then reversed and the patient was then extubated. Upon recovery, the patient was able to open her eyes to command. She could move her left arm and leg spontaneously and to command. She has a little flicker  of movement in the toes of the right foot, otherwise, was unable to move her right arm and leg. She was then transferred to the neuro ICU to continue with further post ischemic stroke management. IMPRESSION: Status post endovascular attempted revascularization of occluded inferior division of the left middle cerebral artery in the mid to distal M2 region as described above. PLAN. Per referring neurologist. Electronically Signed   By: Luanne Bras M.D.   On: 10/04/2018 13:18    PHYSICAL EXAM  Temp:  [98.1 F (36.7 C)-98.9 F (37.2 C)] 98.7 F (37.1 C) (12/18 0800) Pulse Rate:  [31-115] 91 (12/18 1100) Resp:  [17-28] 21 (12/18 1100) BP: (91-154)/(46-109) 134/74 (12/18 1100) SpO2:  [96 %-100 %] 100 % (12/18 1100) Weight:  [52.3 kg] 52.3 kg (12/17 1752)  General - Well nourished, well developed, in no apparent distress.  Ophthalmologic - fundi not visualized due to noncooperation.  Cardiovascular - irregularly irregular heart rate and rhythm with RVR.  Neuro - awake alert, eyes open. Paraphasic errors, partial global aphasia except that she is able to close eyes on voice, but not following other simple commands. PERRL, no gaze preference, tracking to both side, blinking to visual threat on both sides. Slight right facial droop, tongue protrusion not cooperative. BUE limited ROM b/l shoulder but bicep and tricep b/l at least 3/5, symmetrical, hand grip right weaker then left. LLE and RLE proximal and distal 3/5. DTR 1+ and no babinski. Sensation, coordination and gait not tested.    ASSESSMENT/PLAN [name] is  a 84 y.o. female with history of Afib on coumadin, stroke 10 years ago without residue, DM, HTN, HLD admitted for aphasia and right side weakness. No tPA given due to therapeutic INR.    Stroke:  left MCA infarct due to left M2 occlusion s/p IR but unsuccessful, embolic, secondary to Afib even with therapeutic INR  Resultant aphasia and right  hemiparesis  MRI left MCA patchy and small infarcts  CT head right cerebellar infarct, old  CTA head and neck - left M2 occlusion, left subclavian 50% stenosis  2D Echo EF 55-60%  DSA - left M2 occlusion, and M3/M4 occlusion with unsuccessful intervention   LDL 59  HgbA1c 5.2  SCDs for VTE prophylaxis  On diet  warfarin daily prior to admission, now on No antithrombotic given INR therapeutic. Will consider to switch to eliquis 2.23m bid once INR < 2  Ongoing aggressive stroke risk factor management  Therapy recommendations:  Pending   Disposition:  Pending   Persistent AF  Followed with Dr. ROval Linseyin cardiology  On coumadin PTA  On nadolol at home  INR 2.52->3.27->2.99  INR daily  Will consider switch to eliquis 2.513mbid once INR < 2  Hx of stroke  Stroke 10 years ago with aphasia, received tPA and aphasia resolved. No residue. 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  Diabetes  HgbA1c 5.2 goal < 7.0  Controlled  CBG monitoring  SSI  Hypertension . Stable now . Off neo and cleviprex . Resume home meds with low dose nadolol and low dose amlodipine  Long term BP goal normotensive  Hyperlipidemia  Home meds: lipitor 10   LDL 59, goal < 70  Resume lipitor 10  Continue statin at discharge  Other Stroke Risk Factors  Advanced age  Other Active Problems  Elevated Cre 1.60->1.51->1.44  Leukocytosis - WBC 10.9->14.2->10.1 - afebrile   Hospital day # 2  This patient is critically ill due to stroke, unsuccesssful intervention, low BP, afib on coumadin, hx of stroke and at significant risk of neurological worsening, death form recurrent stroke, stroke extension, hemorrhagic conversion, heart failure, seizure. This patient's care requires constant monitoring of vital signs, hemodynamics, respiratory and cardiac monitoring, review of multiple databases, neurological assessment, discussion with family, other  specialists and medical decision making of high complexity. I spent 35 minutes of neurocritical care time in the care of this patient. I had long discussion with daughter and pt at bedside, updated pt current condition, treatment plan and potential prognosis. They expressed understanding and appreciation.    JiRosalin HawkingMD PhD Stroke Neurology 10/05/2018 11:53 AM    To contact Stroke Continuity provider, please refer to [email or web address] hours, contact General Neurology

## 2018-10-05 NOTE — Progress Notes (Signed)
  Speech Language Pathology Treatment: Dysphagia  Patient Details Name: Lavonia Eager MRN: [id number] DOB: 20-Dec-1929 Today's Date: 10/05/2018 Time: 4949-4473 SLP Time Calculation (min) (ACUTE ONLY): 30 min  Assessment / Plan / Recommendation Clinical Impression  Pt demonstrates normal swallow function, no oral weakness and no difficulty masticating. Recommend upgrade to regular diet and thin liquids. Daughter agreeable to plan and RN advanced diet during session. Will sign off for swallowing see next note regarding cognitive linguistic function.   HPI HPI: Pt is an 84 y.o. female with history of Afib on coumadin, stroke 10 years ago without residue, DM, HTN, HLD admitted for aphasia and right side weakness. MRI showed acute L MCA infarct (s/p IR but unsuccessful) with largest area of infarction in the L parietal lobe with extension into the insula and L frontal lobe.      SLP Plan  All goals met       Recommendations                   Plan: All goals met       GO                Annlee Glandon, Katherene Ponto 10/05/2018, 11:15 AM

## 2018-10-05 NOTE — Care Management Note (Signed)
Case Management Note  Patient Details  Name: [name] MRN: [id number] Date of Birth: 14-Apr-1930  Subjective/Objective:     84 y.o. female admitted on 10/03/18 for aphasia and R sided weakness.  Pt dx with L MCA infarct due to L M2 occlusion s/p IR attempted revascularization, but unsuccessful.  PTA, pt needs assistance with ADLS, and uses cane for mobility.                 Action/Plan: PT/OT recommending CIR, and consult requested.  Family able to provide assistance at discharge.  Will follow progress.   Expected Discharge Date:                  Expected Discharge Plan:  IP Rehab Facility  In-House Referral:     Discharge planning Services  CM Consult  Post Acute Care Choice:    Choice offered to:     DME Arranged:    DME Agency:     HH Arranged:    HH Agency:     Status of Service:  In process, will continue to follow  If discussed at Long Length of Stay Meetings, dates discussed:    Additional Comments:  Glennon Macmerson, Rawleigh Rode M, RN 10/05/2018, 4:27 PM

## 2018-10-05 NOTE — Evaluation (Signed)
Speech Language Pathology Evaluation Patient Details Name: [name] MRN: [id number] DOB: 11-Aug-1930 Today's Date: 10/05/2018 Time: 4098-1191 SLP Time Calculation (min) (ACUTE ONLY): 30 min  Problem List:  Patient Active Problem List   Diagnosis Date Noted  . Stroke (HCC) 10/03/2018  . 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 Frequent falls 10/13/2017  . Vitamin D deficiency 04/22/2017  . Slurring of speech 03/31/2017  . Back pain 09/14/2016  . Compression fracture of thoracic vertebra (HCC) 09/14/2016  . S/P vertebroplasty 09/14/2016  . Chronic diastolic heart failure (HCC) 09/14/2016  . HTN (hypertension) 09/14/2016  . History of CVA (cerebrovascular accident) 09/14/2016  . Hypokalemia 09/11/2016  . Paraspinal hematoma 09/11/2016  . Osteoporosis 05/27/2016  . Hypothyroidism 09/11/2014  . Encounter for therapeutic drug monitoring 11/28/2013  . Persistent atrial fibrillation (HCC) 12/15/2012  . HLD (hyperlipidemia) 03/27/2011   Past Medical History:  Past Medical History:  Diagnosis Date  . Atrial fibrillation (HCC)   . Chronic anticoagulation   . Chronic anticoagulation   . CVA (cerebrovascular accident) (HCC)   . Diabetes mellitus   . History of chicken pox   . Hyperlipidemia   . Hypertension    Past Surgical History:  Past Surgical History:  Procedure Laterality Date  . APPENDECTOMY    . BACK SURGERY    . CARDIAC CATHETERIZATION  11/09/91   EF 70%  . DILATION AND CURETTAGE OF UTERUS    . IR ANGIO INTRA EXTRACRAN SEL COM CAROTID INNOMINATE UNI L MOD SED  10/03/2018  . RADIOLOGY WITH ANESTHESIA N/A 10/03/2018   Procedure: IR WITH ANESTHESIA;  Surgeon: Radiologist, Medication, MD;  Location: [facility] OR;  Service: Radiology;  Laterality: N/A;   HPI:  Pt is an 84 y.o. female with history of Afib on coumadin, stroke 10 years ago without residue, DM, HTN, HLD admitted for aphasia and right side weakness. MRI showed acute L MCA infarct (s/p IR but  unsuccessful) with largest area of infarction in the L parietal lobe with extension into the insula and L frontal lobe.   Assessment / Plan / Recommendation Clinical Impression  Pt demonstrates moderate to severe aphasia with expressive and receptive deficits. Pt recognizes some words/phrases in conversation or functional language tasks such as "what time is it" - looks at clock, or "what is your name" but otherwise accuracy with following one step commands or basic Y/N questions is very poor. Expressive language characterized by phonemic paraphasias, neologisms and perseveration in fluent language. Pt has no awareness of deficits and does not respond well to a variety of cues trialed. Accuracy increased with visual and contextual cues with money task (pt manages money very well at baseline, worked at a bank). She enjoys the newspaper as well.  Introduced basic strategies to daughter. Pt will benefit from intensive SLP interventions at [facility]. Will follow acutely to facilitate language.     SLP Assessment  SLP Recommendation/Assessment: Patient needs continued Speech Lanaguage Pathology Services SLP Visit Diagnosis: Aphasia (R47.01)    Follow Up Recommendations  Inpatient Rehab    Frequency and Duration min 2x/week  2 weeks      SLP Evaluation Cognition  Overall Cognitive Status: Difficult to assess(appropriate, limited by language impairment) Orientation Level: Oriented to person;Other (comment)(receptive aphasia )       Comprehension  Auditory Comprehension Overall Auditory Comprehension: Impaired Yes/No Questions: Impaired Other Yes/No Questions Comments`: Maximal Commands: Impaired One Step Basic Commands: 0-24% accurate Interfering Components: Visual impairments Visual Recognition/Discrimination Discrimination: Exceptions to [facility] Common Objects:  Unable to indentify Pictures: Unable to indentify Reading Comprehension Reading Status: Impaired Word level: (reads single words  aloud) Sentence Level: Not tested Paragraph Level: Not tested Functional Environmental (signs, name badge): Within functional limits Interfering Components: Visual acuity;Right neglect/inattention    Expression Verbal Expression Overall Verbal Expression: Impaired Initiation: No impairment Automatic Speech: Name;Social Response;Counting Level of Generative/Spontaneous Verbalization: Conversation Repetition: Impaired Level of Impairment: Word level Naming: Impairment Responsive: 0-25% accurate Confrontation: Impaired Common Objects: Unable to indentify Pictures: Unable to indentify Divergent: Not tested Verbal Errors: Phonemic paraphasias;Perseveration;Not aware of errors Written Expression Dominant Hand: Right   Oral / Motor  Oral Motor/Sensory Function Overall Oral Motor/Sensory Function: Within functional limits Motor Speech Overall Motor Speech: Appears within functional limits for tasks assessed   GO                   Harlon DittyBonnie Mindel Friscia, MA CCC-SLP  Acute Rehabilitation Services Pager [phone] Office [phone]  Claudine MoutonDeBlois, Nissi Doffing Caroline 10/05/2018, 11:30 AM

## 2018-10-05 NOTE — Consult Note (Signed)
Physical Medicine and Rehabilitation Consult Reason for Consult:  Decreased functional mobility Referring Physician: DR Erlinda Hong   HPI: [name] is a 84 y.o.right handed female with history of hypertension, hyperlipidemia, diabetes mellitus, CVA on chronic anticoagulation secondary to atrial fibrillation. Per chart review, patient lives with spouse. One level home with 2 steps to entry.patient required a cane for mobility but required some hand on assistance for transfers also. Presented 10/03/2018 with sudden onset of right side weakness and speech difficulty. Cranial CT reviewed, unremarkable for acute intracranial process.  CT angiogram of head and neck showed left M2 branch occlusion. MRI acute infarction left MCA territory. No associated hemorrhage. Interventional radiology attempt set revascularization of M2 occlusion but unsuccessful. Echocardiogram with ejection fraction of 60%. Systolic function was normal. Neurology follow-up with workup ongoing await plan to resume chronic Coumadin versus transition to Eliquis. Tolerating a regular diet. Therapy evaluations completed with recommendations of physical medicine rehabilitation consult.   Review of Systems  Unable to perform ROS: Language   Past Medical History:  Diagnosis Date  . Atrial fibrillation ([place])   . Chronic anticoagulation   . Chronic anticoagulation   . CVA (cerebrovascular accident) ([place])   . Diabetes mellitus   . History of chicken pox   . Hyperlipidemia   . Hypertension    Past Surgical History:  Procedure Laterality Date  . APPENDECTOMY    . BACK SURGERY    . CARDIAC CATHETERIZATION  11/09/91   EF 70%  . DILATION AND CURETTAGE OF UTERUS    . IR ANGIO INTRA EXTRACRAN SEL COM CAROTID INNOMINATE UNI L MOD SED  10/03/2018  . RADIOLOGY WITH ANESTHESIA N/A 10/03/2018   Procedure: IR WITH ANESTHESIA;  Surgeon: Radiologist, Medication, MD;  Location: [facility];  Service: Radiology;  Laterality: N/A;    Family History  Problem Relation Age of Onset  . Heart disease Mother   . Hypertension Mother   . Stroke Mother   . Heart disease Father   . Heart failure Brother   . Osteoporosis Neg Hx    Social History:  reports that she quit smoking about 19 years ago. She has never used smokeless tobacco. She reports that she does not drink alcohol or use drugs. Allergies:  Allergies  Allergen Reactions  . Cozaar Other (See Comments)    "Almost passed out"  . Hydralazine Other (See Comments)    "almost passed out"  . Hyzaar [Losartan Potassium-Hctz] Other (See Comments)    "almost passed out"  . Sulfa Drugs Cross Reactors Other (See Comments)    "turned me blue"   . Lisinopril     Unknown reaction, per pt   Medications Prior to Admission  Medication Sig Dispense Refill  . acetaminophen (TYLENOL) 500 MG tablet Take 1,000 mg by mouth every 6 (six) hours as needed for mild pain.    Marland Kitchen amLODipine (NORVASC) 2.5 MG tablet Take 2.5 mg by mouth daily.  1  . atorvastatin (LIPITOR) 20 MG tablet TAKE 1/2 (ONE-HALF) TABLET BY MOUTH ONCE DAILY IN THE MORNING (Patient taking differently: Take 10 mg by mouth daily. ) 30 tablet 6  . diazepam (VALIUM) 2 MG tablet TAKE 1 TABLET BY MOUTH TWICE DAILY AS NEEDED FOR ANXIETY (Patient taking differently: Take 2 mg by mouth 2 (two) times daily as needed for anxiety. ) 90 tablet 0  . furosemide (LASIX) 20 MG tablet Take 10 mg by mouth as needed for fluid or edema.    Marland Kitchen levothyroxine (  SYNTHROID, LEVOTHROID) 25 MCG tablet TAKE 1 TABLET BY MOUTH ONCE DAILY BEFORE  BREAKFAST (Patient taking differently: Take 25 mcg by mouth daily before breakfast. ) 90 tablet 0  . nadolol (CORGARD) 20 MG tablet TAKE 1/2 (ONE-HALF) TABLET BY MOUTH ONCE DAILY (Patient taking differently: Take 10 mg by mouth daily. ) 45 tablet 1  . nitroGLYCERIN (NITROSTAT) 0.4 MG SL tablet Place 0.4 mg under the tongue every 5 (five) minutes as needed (chest pain).     Marland Kitchen oxyCODONE-acetaminophen  (PERCOCET/ROXICET) 5-325 MG tablet Take 1 tablet by mouth every 4 (four) hours as needed for severe pain.     . potassium chloride (K-DUR,KLOR-CON) 10 MEQ tablet TAKE 1 TABLET BY MOUTH ONCE DAILY (Patient taking differently: Take 10 mEq by mouth daily. ) 90 tablet 1  . warfarin (COUMADIN) 5 MG tablet TAKE 1/2 TO 1 (ONE-HALF TO ONE) TABLET BY MOUTH ONCE DAILY AS DIRECTED BY  COUMADIN  CLINIC (Patient taking differently: Take 2.5-5 mg by mouth every morning. Take 2.63m daily on Sun/Tues/Wed/Thurs/Sat and 568mon Mon & Fri.) 90 tablet 1    Home: [place] to be discharged to:: Private residence Living Arrangements: Spouse/significant other Available Help at Discharge: Family, Available 24 hours/day Type of Home: House Home Access: Stairs to enter [employer] Steps: 2 Entrance Stairs-Rails: None Home Layout: One level Bathroom Shower/Tub: Tub/shower unit(garden tub) Bathroom Toilet: Standard Home Equipment: WaEnvironmental consultant 2 wheels, Cane - single point, ShTourist information centre manager Lives With: Spouse  Functional History: Prior Function Level of Independence: Needs assistance Gait / Transfers Assistance Needed: used cane for mobility but requires hands on assist and min assist to transfer at times.  Also walks flexed forward due to chronic back pain.  ADL's / Homemaking Assistance Needed: some assist for bathing and dressing, steadying assist for toileting  Comments: hx of B arm fxs and limited ROM UEs Functional Status:  Mobility: Bed Mobility Overal bed mobility: Needs Assistance Bed Mobility: Rolling, Sidelying to Sit, Sit to Supine Rolling: +2 for physical assistance, Min assist Sidelying to sit: Max assist, +2 for physical assistance Sit to supine: Max assist, +2 for physical assistance General bed mobility comments: Pt rolling towards L side with min assist for safety, limited bed mobility by pain and highly resistive to movement.  Patient's spouse  assisted as she was less resistive to have him assist but requires heavy max assist +2 to transition from sidelying to sitting.  Transfers Overall transfer level: Needs assistance Equipment used: 2 person hand held assist Transfers: Sit to/from Stand Sit to Stand: +2 physical assistance, Mod assist General transfer comment: +2 assist for sit to stand and [facility] noted for details  Ambulation/Gait Ambulation/Gait assistance: Mod assist, +2 physical assistance Gait Distance (Feet): 3 Feet Assistive device: 2 person hand held assist Gait Pattern/deviations: Step-to pattern General Gait Details: side stepped to [facility] better positioning when returning to supine.      ADL: ADL Overall ADL's : Needs assistance/impaired Eating/Feeding: NPO Grooming: Wash/dry hands, Wash/dry face, Maximal assistance, Sitting Grooming Details (indicate cue type and reason): able to wash face but requires assist to complete hand washing  Upper Body Bathing: Moderate assistance, Sitting Lower Body Bathing: Maximal assistance, +2 for physical assistance, +2 for safety/equipment, Sit to/from stand Upper Body Dressing : Maximal assistance, Sitting Lower Body Dressing: Total assistance, +2 for physical assistance, Sit to/from stand Toilet Transfer Details (indicate cue type and reason): deferred due to pain Toileting- Clothing  Manipulation and Hygiene: Total assistance, +2 for physical assistance, Sit to/from stand Toileting - Clothing Manipulation Details (indicate cue type and reason): for hygiene after soiling bed, total assist  Functional mobility during ADLs: Maximal assistance, +2 for physical assistance General ADL Comments: patient limited during session by expressive difficulties, chronic back pain, and genearlized weakness   Cognition: Cognition Overall Cognitive Status: Difficult to assess Orientation Level: Oriented to person, Other (comment)(receptive aphasia  ) Cognition Arousal/Alertness: Awake/alert Behavior During Therapy: Flat affect Overall Cognitive Status: Difficult to assess General Comments: pt able to recognize family, able to follow simple commands with fair consistency . Difficult to assess due to: Impaired communication  Blood pressure (!) 147/71, pulse 93, temperature 98.5 F (36.9 C), temperature source Oral, resp. rate (!) 22, height _0  (1.575 m), weight 52.3 kg, SpO2 100 %. Physical Exam  Vitals reviewed. Constitutional: She appears well-developed and well-nourished.  HENT:  Head: Normocephalic and atraumatic.  Eyes: EOM are normal. Right eye exhibits no discharge. Left eye exhibits no discharge.  Neck: Normal range of motion. Neck supple. No thyromegaly present.  Cardiovascular:  Irregularly irregular  Respiratory: Effort normal and breath sounds normal. No respiratory distress.  GI: Soft. Bowel sounds are normal. She exhibits no distension.  Musculoskeletal:     Comments: No edema or tenderness in extremities  Neurological: She is alert.  Global aphasia.  Motor exam limited by ability to participate, however moving all extremities spontaneously.   She does have some spontaneous speech.  Skin: Skin is warm and dry.  Psychiatric:  Unable to assess due to mentation    Results for orders placed or performed during the hospital encounter of 10/03/18 (from the past 24 hour(s))  CBC     Status: Abnormal   Collection Time: 10/05/18  4:10 AM  Result Value Ref Range   WBC 10.1 4.0 - 10.5 K/uL   RBC 3.04 (L) 3.87 - 5.11 MIL/uL   Hemoglobin 9.7 (L) 12.0 - 15.0 g/dL   HCT 29.8 (L) 36.0 - 46.0 %   MCV 98.0 80.0 - 100.0 fL   MCH 31.9 26.0 - 34.0 pg   MCHC 32.6 30.0 - 36.0 g/dL   RDW 12.2 11.5 - 15.5 %   Platelets 263 150 - 400 K/uL   nRBC 0.0 0.0 - 0.2 %  Basic metabolic panel     Status: Abnormal   Collection Time: 10/05/18  4:10 AM  Result Value Ref Range   Sodium 139 135 - 145 mmol/L   Potassium 3.8 3.5 - 5.1  mmol/L   Chloride 106 98 - 111 mmol/L   CO2 21 (L) 22 - 32 mmol/L   Glucose, Bld 114 (H) 70 - 99 mg/dL   BUN 16 8 - 23 mg/dL   Creatinine, Ser 1.44 (H) 0.44 - 1.00 mg/dL   Calcium 7.3 (L) 8.9 - 10.3 mg/dL   GFR calc non Af Amer 33 (L) >60 mL/min   GFR calc Af Amer 38 (L) >60 mL/min   Anion gap 12 5 - 15  Protime-INR     Status: Abnormal   Collection Time: 10/05/18  4:10 AM  Result Value Ref Range   Prothrombin Time 30.6 (H) 11.4 - 15.2 seconds   INR 2.99    Ct Angio Head W Or Wo Contrast  Result Date: 10/03/2018 CLINICAL DATA:  Focal neuro deficit with stroke suspected-deficit is not specified in the history or charted. EXAM: CT ANGIOGRAPHY HEAD AND NECK TECHNIQUE: Multidetector CT imaging of the head and neck  was performed using the standard protocol during bolus administration of intravenous contrast. Multiplanar CT image reconstructions and MIPs were obtained to evaluate the vascular anatomy. Carotid stenosis measurements (when applicable) are obtained utilizing NASCET criteria, using the distal internal carotid diameter as the denominator. CONTRAST:  38m ISOVUE-370 IOPAMIDOL (ISOVUE-370) INJECTION 76% COMPARISON:  Head CT from earlier today. FINDINGS: CTA NECK FINDINGS Aortic arch: The arch and great vessel origins are not covered. Right carotid system: Moderate calcified plaque about the common carotid bifurcation without ulceration or flow limiting stenosis. Left carotid system: Bulky calcified plaque at the ICA bulb with no flow limiting stenosis or ulceration. No ICA beading. Vertebral arteries: The subclavian origins are not covered. There is subclavian atherosclerosis with 50% narrowing on the left. Mild dominance of the right vertebral artery. The vertebral arteries are tortuous without beading or flow limiting stenosis. Skeleton: Usual degenerative changes. No acute or aggressive finding. Other neck: No evident mass or inflammation. Upper chest: No acute finding Review of the MIP  images confirms the above findings CTA HEAD FINDINGS Anterior circulation: Left M2 branch occlusion with paucity of downstream vessels. Calcified plaque on the carotid siphons. No proximal flow limiting stenosis. Negative for aneurysm. Posterior circulation: Vertebrobasilar arteries are smooth and widely patent. Hypoplastic left P1 and aplastic right P1 segments. No branch occlusion or flow limiting stenosis. Venous sinuses: Negative Anatomic variants: As above Delayed phase: Not obtained in the emergent setting Review of the MIP images confirms the above findings Critical Value/emergent results were called by telephone at the time of interpretation on 10/03/2018 at 4:25 pm to Dr. DEtta Quill, who verbally acknowledged these results. IMPRESSION: 1. Left M2 branch occlusion. 2. Atherosclerosis but no high-grade stenosis or embolic source identified in the neck. 3. 50% narrowing of the proximal left subclavian. 4. The arch and great vessel ostia were not covered in the field of view. Electronically Signed   By: JMonte FantasiaM.D.   On: 10/03/2018 16:28   Ct Angio Neck W Or Wo Contrast  Result Date: 10/03/2018 CLINICAL DATA:  Focal neuro deficit with stroke suspected-deficit is not specified in the history or charted. EXAM: CT ANGIOGRAPHY HEAD AND NECK TECHNIQUE: Multidetector CT imaging of the head and neck was performed using the standard protocol during bolus administration of intravenous contrast. Multiplanar CT image reconstructions and MIPs were obtained to evaluate the vascular anatomy. Carotid stenosis measurements (when applicable) are obtained utilizing NASCET criteria, using the distal internal carotid diameter as the denominator. CONTRAST:  538mISOVUE-370 IOPAMIDOL (ISOVUE-370) INJECTION 76% COMPARISON:  Head CT from earlier today. FINDINGS: CTA NECK FINDINGS Aortic arch: The arch and great vessel origins are not covered. Right carotid system: Moderate calcified plaque about the common carotid  bifurcation without ulceration or flow limiting stenosis. Left carotid system: Bulky calcified plaque at the ICA bulb with no flow limiting stenosis or ulceration. No ICA beading. Vertebral arteries: The subclavian origins are not covered. There is subclavian atherosclerosis with 50% narrowing on the left. Mild dominance of the right vertebral artery. The vertebral arteries are tortuous without beading or flow limiting stenosis. Skeleton: Usual degenerative changes. No acute or aggressive finding. Other neck: No evident mass or inflammation. Upper chest: No acute finding Review of the MIP images confirms the above findings CTA HEAD FINDINGS Anterior circulation: Left M2 branch occlusion with paucity of downstream vessels. Calcified plaque on the carotid siphons. No proximal flow limiting stenosis. Negative for aneurysm. Posterior circulation: Vertebrobasilar arteries are smooth and widely patent. Hypoplastic left P1  and aplastic right P1 segments. No branch occlusion or flow limiting stenosis. Venous sinuses: Negative Anatomic variants: As above Delayed phase: Not obtained in the emergent setting Review of the MIP images confirms the above findings Critical Value/emergent results were called by telephone at the time of interpretation on 10/03/2018 at 4:25 pm to Dr. Etta Quill , who verbally acknowledged these results. IMPRESSION: 1. Left M2 branch occlusion. 2. Atherosclerosis but no high-grade stenosis or embolic source identified in the neck. 3. 50% narrowing of the proximal left subclavian. 4. The arch and great vessel ostia were not covered in the field of view. Electronically Signed   By: Monte Fantasia M.D.   On: 10/03/2018 16:28   Mr Brain Wo Contrast  Result Date: 10/04/2018 CLINICAL DATA:  Stroke.  Aphasia left-sided weakness EXAM: MRI HEAD WITHOUT CONTRAST TECHNIQUE: Multiplanar, multiecho pulse sequences of the brain and surrounding structures were obtained without intravenous contrast. COMPARISON:   CTA 10/03/2018 FINDINGS: Brain: Acute infarct in the left parietal lobe involving cortex and white matter. Small areas of acute infarct extend into the posterior insula and left frontal white matter. Moderate atrophy. Chronic ischemic changes in the pons bilaterally and in the right cerebellum. Chronic ischemic changes throughout the cerebral white matter bilaterally. Negative for hemorrhage mass or midline shift. Vascular: Negative for hyperdense vessel Skull and upper cervical spine: Negative Sinuses/Orbits: Mild mucosal edema paranasal sinuses. Bilateral cataract surgery Other: None IMPRESSION: Acute infarct left MCA territory. Largest area infarction is in the left parietal lobe with extension into the insula and left frontal lobe. No associated hemorrhage Atrophy and chronic ischemic changes as above. Electronically Signed   By: Franchot Gallo M.D.   On: 10/04/2018 13:48   Dg Chest Port 1 View  Result Date: 10/04/2018 CLINICAL DATA:  Stroke EXAM: PORTABLE CHEST 1 VIEW COMPARISON:  01/09/2015 FINDINGS: Borderline mild cardiomegaly. Aortic atherosclerosis. Bronchovascular crowding versus vascular congestion, no pulmonary edema. Biapical pleuroparenchymal scarring. No consolidation, pleural effusion, or pneumothorax. No acute osseous abnormalities are seen. Kyphoplasty in the thoracic vertebra. IMPRESSION: Borderline mild cardiomegaly. Bronchovascular crowding versus vascular congestion. Electronically Signed   By: Keith Rake M.D.   On: 10/04/2018 03:03   Ct Head Code Stroke Wo Contrast  Result Date: 10/03/2018 CLINICAL DATA:  Code stroke. 84 y/o F; Focal neuro deficit, < 6 hrs, stroke suspected. EXAM: CT HEAD WITHOUT CONTRAST TECHNIQUE: Contiguous axial images were obtained from the base of the skull through the vertex without intravenous contrast. COMPARISON:  09/21/2018 CT head FINDINGS: Brain: No evidence of acute infarction, hemorrhage, hydrocephalus, extra-axial collection or mass  lesion/mass effect. Stable chronic infarctions within the right lateral cerebellar hemisphere. Stable white matter hypodensities compatible with chronic microvascular ischemic changes and stable volume loss of the brain. Stable small scattered hypodensities within the basal ganglia and left pons, likely represent chronic lacunar infarctions. Vascular: Calcific atherosclerosis of carotid siphons. No hyperdense vessel identified. Skull: Normal. Negative for fracture or focal lesion. Sinuses/Orbits: No acute finding. Other: None. ASPECTS [facility] Stroke Program Early CT Score) - Ganglionic level infarction (caudate, lentiform nuclei, internal capsule, insula, M1-M3 cortex): 7 - Supraganglionic infarction (M4-M6 cortex): 3 Total score (0-10 with 10 being normal): 10 IMPRESSION: 1. No acute intracranial abnormality identified. 2. ASPECTS is 10. 3. Stable chronic microvascular ischemic changes and volume loss of the brain. Stable small chronic infarctions in right lateral cerebellar hemisphere, left pons, and bilateral basal ganglia. These results were communicated to Dr. Lorraine Lax at [place] 12/16/2019by text page via the [facility] messaging system.  Electronically Signed   By: Kristine Garbe M.D.   On: 10/03/2018 16:12   Ir Angio Intra Extracran Sel Com Carotid Innominate Uni L Mod Sed  Result Date: 10/05/2018 INDICATION: Acute onset of aphasia and right-sided weakness. Occluded left middle cerebral artery inferior division distal M2 segment EXAM: 1. EMERGENT LARGE VESSEL OCCLUSION THROMBOLYSIS (anterior CIRCULATION) COMPARISON:  CT angiogram of the head and neck of 10/03/2018. MEDICATIONS: Ancef 2 g IV antibiotic was administered within 1 hour of the procedure. ANESTHESIA/SEDATION: General anesthesia. CONTRAST:  Isovue 300 approximately 100 mL. FLUOROSCOPY TIME:  Fluoroscopy Time: 48 minutes 18 seconds (966.9 mGy). COMPLICATIONS: None immediate. TECHNIQUE: Following a full explanation of the procedure along  with the potential associated complications, an informed witnessed consent was obtained from the patient's husband and daughter. The risks of intracranial hemorrhage of 10%, worsening neurological deficit, ventilator dependency, death and inability to revascularize were all reviewed in detail with the patient's family. The patient was then put under general anesthesia by the Department of Anesthesiology at [facility]. The right groin was prepped and draped in the usual sterile fashion. Thereafter using modified Seldinger technique, transfemoral access into the right common femoral artery was obtained without difficulty. Over a 0.035 inch guidewire a 5 French Pinnacle sheath was inserted. Through this, and also over a 0.035 inch guidewire a 5 French Simmons 2 diagnostic catheter was advanced to the aortic arch region and selectively positioned in the left common carotid artery. FINDINGS: The left common carotid arteriogram demonstrates an acute origin of the left common carotid artery in addition to calcification, and also aortic arch tortuosity. The left common carotid bifurcation demonstrates approximately 50-70% stenosis at the origin of the left external carotid artery. Its branches are normally opacified. The left internal carotid artery just distal to the bulb has approximately 30% stenosis by the NASCET criteria. No evidence of acute ulcerations, or of intraluminal filling defects are seen. More distally, the cervical petrous junctions are widely patent. There is a fusiform dilatation of the caval cavernous segment of the left internal carotid artery. The supraclinoid segment is widely patent. The left posterior communicating artery is seen opacifying the left posterior cerebral artery distribution. The left middle cerebral artery and the left anterior cerebral artery opacify into the capillary and venous phases. There was truncation and occlusion of the mid to distal third of the M2 segment of the  inferior division of the left middle cerebral artery. A moderate-sized area of hypoperfusion is noted in the cortical subcortical area of the left parietal region. PROCEDURE: The diagnostic Simmons 2 catheter in the left common carotid artery was then exchanged over a 0.035 inch 300 cm Martinez exchange guidewire for an 8 French 55 cm Brite tip neurovascular sheath using biplane roadmap technique and constant fluoroscopic guidance. Good aspiration obtained from the side port of the neurovascular sheath. This was then connected to continuous heparinized saline infusion. Over the [facility] exchange guidewire, an 85 cm 8 [place] FlowGate balloon guide catheter which had been prepped with 50% contrast and 50% heparinized saline infusion was advanced and positioned just distal to the origin of the left carotid artery. Further advancement was met with significant flow resistance and herniation of the system into the aortic arch because of the tortuosity. Further attempts were then made using an 8 [place] Neuron Max guide sheath. A triaxial system was also utilized using a 4 French 120 cm slip guide catheter over which attempts were made to advance the Neuron Max neurovascular sheath, and also  with the 6 French 85 cm Cook shuttle sheath and also the 8 French 85 cm FlowGate guide catheter. After multiple attempts to access the more distal anterior circulation, with the tortuosity of the aortic arch, and the acuteness of the origin of the left common carotid artery it was decided to perform a diagnostic arteriogram using a 5 [place] JB 3 catheter in the left common carotid artery. This demonstrated the extracranial and intracranial portions of the left internal carotid artery to be unchanged and widely patent. The left middle cerebral artery now demonstrated that the filling defect in the M2 region of the inferior division of the left middle cerebral artery had moved more distally making access even more difficult. It was, therefore,  decided to terminate the procedure. A final control arteriogram performed demonstrated the left external carotid artery and the internal carotid artery to be unchanged. The intracranial portion of the anterior circulation also demonstrated wide patency of the anterior cerebral artery and the left middle cerebral artery distribution apart from 2 small focal areas of occlusion of the M3 M4 regions of the anterior perisylvian branches, and also the filling defect in the inferior division of the left middle cerebral artery M2 M3 region which had now moved more distally. Throughout the procedure, the patient's blood pressure and neurological status remained stable. The 8 French 55 cm Brite tip neurovascular sheath was then exchanged over a J-tip guidewire for an 8 [place] Pinnacle sheath. This in turn was then removed with the successful hemostasis manually in the right groin puncture site over 30 minutes. The patient's distal pulses in both feet were Dopplerable. Skin on the feet was warm bilaterally. The patient's general anesthesia was then reversed and the patient was then extubated. Upon recovery, the patient was able to open her eyes to command. She could move her left arm and leg spontaneously and to command. She has a little flicker of movement in the toes of the right foot, otherwise, was unable to move her right arm and leg. She was then transferred to the neuro ICU to continue with further post ischemic stroke management. IMPRESSION: Status post endovascular attempted revascularization of occluded inferior division of the left middle cerebral artery in the mid to distal M2 region as described above. PLAN. Per referring neurologist. Electronically Signed   By: Luanne Bras M.D.   On: 10/04/2018 13:18    Assessment/Plan: Diagnosis: left MCA territory infarct Labs and images (see above) independently reviewed.  Records reviewed and summated above. Stroke: Continue secondary stroke prophylaxis and Risk  Factor Modification listed below:   Antiplatelet therapy:   Blood Pressure Management:  Continue current medication with prn's with permisive HTN per primary team Statin Agent:   ?Right sided hemiparesis: fit for orthosis to prevent contractures (resting hand splint for day, wrist cock up splint at night, PRAFO, if necessary) Motor recovery: Fluoxetine  1. Does the need for close, 24 hr/day medical supervision in concert with the patient's rehab needs make it unreasonable for this patient to be served in a less intensive setting? Yes  2. Co-Morbidities requiring supervision/potential complications: HTN (monitor and provide prns in accordance with increased physical exertion and pain), hyperlipidemia (continue meds), DM (Monitor in accordance with exercise and adjust meds as necessary), CVA, atrial fibrillation (continue meds, monitor heart rate with increased physical activity), tachypnea (monitor RR and O2 Sats with increased physical exertion), CKD (avoid nephrotoxic meds) 3. Due to bladder management, safety, skin/wound care, medication administration and patient education, does the patient require  24 hr/day rehab nursing? Yes 4. Does the patient require coordinated care of a physician, rehab nurse, PT (1-2 hrs/day, 5 days/week), OT (1-2 hrs/day, 5 days/week) and SLP (1-2 hrs/day, 5 days/week) to address physical and functional deficits in the context of the above medical diagnosis(es)? Yes Addressing deficits in the following areas: balance, endurance, locomotion, strength, transferring, bowel/bladder control, bathing, dressing, toileting, cognition, language and psychosocial support 5. Can the patient actively participate in an intensive therapy program of at least 3 hrs of therapy per day at least 5 days per week? Yes 6. The potential for patient to make measurable gains while on inpatient rehab is excellent 7. Anticipated functional outcomes upon discharge from inpatient rehab are min assist   with PT, min assist with OT, min assist with SLP. 8. Estimated rehab length of stay to reach the above functional goals is: 18-22 days. 9. Anticipated D/C setting: Other 10. Anticipated post D/C treatments: SNF. 11. Overall Rehab/Functional Prognosis: good  RECOMMENDATIONS: This patient's condition is appropriate for continued rehabilitative care in the following setting: CIR if caregiver support available upon discharge. Patient has agreed to participate in recommended program. Potentially Note that insurance prior authorization may be required for reimbursement for recommended care.  Comment: Rehab Admissions Coordinator to follow up.   I have personally performed a face to face diagnostic evaluation, including, but not limited to relevant history and physical exam findings, of this patient and developed relevant assessment and plan.  Additionally, I have reviewed and concur with the physician assistant's documentation above.   Delice Lesch, MD, ABPMR Lavon Paganini Angiulli, PA-C 10/05/2018

## 2018-10-05 NOTE — Evaluation (Signed)
Occupational Therapy Evaluation Patient Details Name: [name] MRN: [id number] DOB: 1930-10-10 Today's Date: 10/05/2018    History of Present Illness 84 y.o. female admitted on 10/03/18 for aphasia and R sided weakness.  Pt dx with L MCA infarct due to L M2 occlusion s/p IR attempted revascularization, but unsuccessful.  Pt also with persistent A-fib and HTN.  Pt with significant PMH of HTN, DM, CVA (per daughter no significant residual issues from prior stroke), A-fib on chronic anticoagulation, back surgery, and bil UE fractures due to falls.   Clinical Impression   PTA patient required some assist for self care tasks, guarding assist for mobility, and intermittent assist for sit to stand. Patient admitted for above and limited by problem list below, including chronic back pain, impaired cognition, aphasia, and generalized weakness.  Patient currently requires +2 max assist for bed mobility, mod- max assist for UB ADLs, max assist +2 for LB ADLs, and mod assist +2 for sit to stand and [facility].  Patient will benefit from intensive CIR level therapies at discharge in order to optimize safety, return to PLOF with ADLs, and decrease burden of care.  She will have 24/7 support from spouse.  Will continue to follow.     Follow Up Recommendations  CIR    Equipment Recommendations  3 in 1 bedside commode;Tub/shower bench    Recommendations for Other Services Rehab consult     Precautions / Restrictions Precautions Precautions: Fall;Other (comment) Precaution Comments: chronic back pain for which she takes daily narcotics Restrictions Weight Bearing Restrictions: No      Mobility Bed Mobility Overal bed mobility: Needs Assistance Bed Mobility: Rolling;Sidelying to Sit;Sit to Supine Rolling: +2 for physical assistance;Min assist Sidelying to sit: Max assist;+2 for physical assistance   Sit to supine: Max assist;+2 for physical assistance   General bed  mobility comments: Pt rolling towards L side with min assist for safety, limited bed mobility by pain and highly resistive to movement.  Patient's spouse assisted as she was less resistive to have him assist but requires heavy max assist +2 to transition from sidelying to sitting.   Transfers Overall transfer level: Needs assistance Equipment used: 2 person hand held assist Transfers: Sit to/from Stand Sit to Stand: +2 physical assistance;Mod assist         General transfer comment: +2 assist for sit to stand and side stepping to [facility]-- see PT noted for details     Balance Overall balance assessment: Needs assistance Sitting-balance support: Feet supported;Bilateral upper extremity supported Sitting balance-Leahy Scale: Poor Sitting balance - Comments: needs min assist EOB- limited by pain   Standing balance support: Bilateral upper extremity supported Standing balance-Leahy Scale: Poor Standing balance comment: +2 assist required, reliant on external support                           ADL either performed or assessed with clinical judgement   ADL Overall ADL's : Needs assistance/impaired Eating/Feeding: NPO   Grooming: Wash/dry hands;Wash/dry face;Maximal assistance;Sitting Grooming Details (indicate cue type and reason): able to wash face but requires assist to complete hand washing  Upper Body Bathing: Moderate assistance;Sitting   Lower Body Bathing: Maximal assistance;+2 for physical assistance;+2 for safety/equipment;Sit to/from stand   Upper Body Dressing : Maximal assistance;Sitting   Lower Body Dressing: Total assistance;+2 for physical assistance;Sit to/from Market researcherstand     Toilet Transfer Details (indicate cue type and reason): deferred due to pain Toileting-  Clothing Manipulation and Hygiene: Total assistance;+2 for physical assistance;Sit to/from stand Toileting - Clothing Manipulation Details (indicate cue type and reason): for hygiene after soiling bed,  total assist      Functional mobility during ADLs: Maximal assistance;+2 for physical assistance General ADL Comments: patient limited during session by expressive difficulties, chronic back pain, and genearlized weakness      Vision Baseline Vision/History: Wears glasses Wears Glasses: Reading only Additional Comments: further assessment needed     Perception     Praxis      Pertinent Vitals/Pain Pain Assessment: Faces Faces Pain Scale: Hurts whole lot Pain Location: back, chronic Pain Descriptors / Indicators: Grimacing;Guarding Pain Intervention(s): Limited activity within patient's tolerance;Repositioned;Monitored during session     Hand Dominance Right   Extremity/Trunk Assessment Upper Extremity Assessment Upper Extremity Assessment: RUE deficits/detail;LUE deficits/detail RUE Deficits / Details: grossly 3+/5 MMT, limited shoulder flexion to approx 90 due to hx of injuries  LUE Deficits / Details: grossly 3+/5 MMT, limited shoulder flexion to approx 90 due to hx of injuries    Lower Extremity Assessment Lower Extremity Assessment: Defer to PT evaluation RLE Deficits / Details: motor strength seems to be intact bil, however, motor planning may be difficult, seems to do automatic movement more readily than when asked to bend legs, etc.  pt responding to touch bil legs, but difficult to test sensation.  No significant buckling with standing or side stepping.  RLE Sensation: WNL RLE Coordination: WNL LLE Deficits / Details: motor strength seems to be intact bil, however, motor planning may be difficult, seems to do automatic movement more readily than when asked to bend legs, etc.  pt responding to touch bil legs, but difficult to test sensation.  No significant buckling with standing or side stepping.  LLE Sensation: WNL LLE Coordination: WNL   Cervical / Trunk Assessment Cervical / Trunk Assessment: Other exceptions Cervical / Trunk Exceptions: chronic back pain, takes  narcotics daily, per daughter forward flexed posture is her baseline.    Communication Communication Communication: Expressive difficulties   Cognition Arousal/Alertness: Awake/alert Behavior During Therapy: Flat affect Overall Cognitive Status: Difficult to assess                                 General Comments: pt able to recognize family, able to follow simple commands with fair consistency .   General Comments  family present and supportive.     Exercises     Shoulder Instructions      Home Living Family/patient expects to be discharged to:: Private residence Living Arrangements: Spouse/significant other Available Help at Discharge: Family;Available 24 hours/day Type of Home: House Home Access: Stairs to enter [employer] of Steps: 2 Entrance Stairs-Rails: None Home Layout: One level     Bathroom Shower/Tub: Tub/shower unit(garden tub)   Bathroom Toilet: Standard     Home Equipment: Environmental consultant - 2 wheels;Cane - single point;Shower seat(lift chair)      Lives With: Spouse    Prior Functioning/Environment Level of Independence: Needs assistance  Gait / Transfers Assistance Needed: used cane for mobility but requires hands on assist and min assist to transfer at times.  Also walks flexed forward due to chronic back pain.  ADL's / Homemaking Assistance Needed: some assist for bathing and dressing, steadying assist for toileting    Comments: hx of B arm fxs and limited ROM UEs        OT Problem List: Decreased strength;Decreased  range of motion;Decreased activity tolerance;Impaired balance (sitting and/or standing);Decreased cognition;Decreased coordination;Decreased safety awareness;Decreased knowledge of use of DME or AE;Decreased knowledge of precautions;Pain      OT Treatment/Interventions: Self-care/ADL training;Therapeutic exercise;Energy conservation;DME and/or AE instruction;Neuromuscular education;Cognitive  remediation/compensation;Therapeutic activities;Patient/family education;Balance training    OT Goals(Current goals can be found in the care plan section) Acute Rehab OT Goals Patient Stated Goal: she wants to be able to return home OT Goal Formulation: With patient/family Time For Goal Achievement: 10/19/18 Potential to Achieve Goals: Good  OT Frequency: Min 2X/week   Barriers to D/C:            Co-evaluation PT/OT/SLP Co-Evaluation/Treatment: Yes Reason for Co-Treatment: Complexity of the patient's impairments (multi-system involvement);Necessary to address cognition/behavior during functional activity;For patient/therapist safety;To address functional/ADL transfers PT goals addressed during session: Mobility/safety with mobility;Balance;Strengthening/ROM OT goals addressed during session: ADL's and self-care      AM-PAC OT "6 Clicks" Daily Activity     Outcome Measure Help from another person eating meals?: Total Help from another person taking care of personal grooming?: A Lot Help from another person toileting, which includes using toliet, bedpan, or urinal?: Total Help from another person bathing (including washing, rinsing, drying)?: A Lot Help from another person to put on and taking off regular upper body clothing?: A Lot Help from another person to put on and taking off regular lower body clothing?: Total 6 Click Score: 9   End of Session Equipment Utilized During Treatment: Gait belt Nurse Communication: Mobility status  Activity Tolerance: Patient limited by pain Patient left: in bed;with call bell/phone within reach;with bed alarm set;with family/visitor present  OT Visit Diagnosis: Other abnormalities of gait and mobility (R26.89);Muscle weakness (generalized) (M62.81);Pain;Other symptoms and signs involving the nervous system (R29.898) Pain - part of body: (back)                Time: 1610-9604 OT Time Calculation (min): 30 min Charges:  OT General Charges $OT  Visit: 1 Visit OT Evaluation $OT Eval High Complexity: 1 High  Chancy Milroy, OT Acute Rehabilitation Services Pager [phone] Office [phone]   Chancy Milroy 10/05/2018, 2:58 PM

## 2018-10-05 NOTE — Progress Notes (Signed)
Pt Admitted to the unit as a transfer from 4N. Pt arrived via bed with family and belonging to the side. Pt alert and verbally responsive; pt oriented to the unit and room; fall/safety precaution and prevention education completed with pt and family; all voices understanding and denies any questions. Pt skin intact with no pressure ulcers or opened wounds noted except for ecchymosis to BUE and right groin cath site level 1 with bruising and clean,dry and intact dsg. Telemetry applied and verified with CCMD; NT called to second verify. Pt in bed with call light within reach and bed alarm on. Family remains at bedside. [name]. Amo Kashana Breach RN   10/05/18 1347  Vitals  Temp 98.5 F (36.9 C)  Temp Source Oral  BP (!) 147/71  BP Location Left Arm  BP Method Automatic  Patient Position (if appropriate) Lying  Pulse Rate 93  Pulse Rate Source Dinamap  ECG Heart Rate 93  Resp (!) 22  Oxygen Therapy  SpO2 100 %  O2 Device Room [id number]

## 2018-10-06 ENCOUNTER — Encounter (HOSPITAL_COMMUNITY): Payer: Self-pay

## 2018-10-06 ENCOUNTER — Other Ambulatory Visit: Payer: Self-pay

## 2018-10-06 ENCOUNTER — Inpatient Hospital Stay (HOSPITAL_COMMUNITY)
Admission: RE | Admit: 2018-10-06 | Discharge: 2018-10-12 | DRG: 057 | Disposition: A | Discharge status: Home / Self Care | Payer: Medicare Other | Source: Intra-hospital | Attending: Physical Medicine & Rehabilitation | Admitting: Physical Medicine & Rehabilitation

## 2018-10-06 DIAGNOSIS — Z87891 Personal history of nicotine dependence: Secondary | ICD-10-CM | POA: Diagnosis not present

## 2018-10-06 DIAGNOSIS — Z882 Allergy status to sulfonamides status: Secondary | ICD-10-CM | POA: Diagnosis not present

## 2018-10-06 DIAGNOSIS — Z79899 Other long term (current) drug therapy: Secondary | ICD-10-CM

## 2018-10-06 DIAGNOSIS — I69320 Aphasia following cerebral infarction: Secondary | ICD-10-CM

## 2018-10-06 DIAGNOSIS — I69351 Hemiplegia and hemiparesis following cerebral infarction affecting right dominant side: Principal | ICD-10-CM

## 2018-10-06 DIAGNOSIS — M549 Dorsalgia, unspecified: Secondary | ICD-10-CM | POA: Diagnosis present

## 2018-10-06 DIAGNOSIS — E039 Hypothyroidism, unspecified: Secondary | ICD-10-CM | POA: Diagnosis present

## 2018-10-06 DIAGNOSIS — Z8249 Family history of ischemic heart disease and other diseases of the circulatory system: Secondary | ICD-10-CM

## 2018-10-06 DIAGNOSIS — I482 Chronic atrial fibrillation, unspecified: Secondary | ICD-10-CM | POA: Diagnosis not present

## 2018-10-06 DIAGNOSIS — E785 Hyperlipidemia, unspecified: Secondary | ICD-10-CM | POA: Diagnosis present

## 2018-10-06 DIAGNOSIS — I4819 Other persistent atrial fibrillation: Secondary | ICD-10-CM

## 2018-10-06 DIAGNOSIS — Z7901 Long term (current) use of anticoagulants: Secondary | ICD-10-CM | POA: Diagnosis not present

## 2018-10-06 DIAGNOSIS — Z79891 Long term (current) use of opiate analgesic: Secondary | ICD-10-CM | POA: Diagnosis not present

## 2018-10-06 DIAGNOSIS — Z888 Allergy status to other drugs, medicaments and biological substances status: Secondary | ICD-10-CM | POA: Diagnosis not present

## 2018-10-06 DIAGNOSIS — R4701 Aphasia: Secondary | ICD-10-CM | POA: Diagnosis not present

## 2018-10-06 DIAGNOSIS — E119 Type 2 diabetes mellitus without complications: Secondary | ICD-10-CM | POA: Diagnosis present

## 2018-10-06 DIAGNOSIS — G8929 Other chronic pain: Secondary | ICD-10-CM | POA: Diagnosis present

## 2018-10-06 DIAGNOSIS — Z823 Family history of stroke: Secondary | ICD-10-CM

## 2018-10-06 DIAGNOSIS — R269 Unspecified abnormalities of gait and mobility: Secondary | ICD-10-CM

## 2018-10-06 DIAGNOSIS — I10 Essential (primary) hypertension: Secondary | ICD-10-CM | POA: Diagnosis present

## 2018-10-06 DIAGNOSIS — Z79890 Hormone replacement therapy (postmenopausal): Secondary | ICD-10-CM

## 2018-10-06 DIAGNOSIS — I63512 Cerebral infarction due to unspecified occlusion or stenosis of left middle cerebral artery: Secondary | ICD-10-CM | POA: Diagnosis present

## 2018-10-06 DIAGNOSIS — I69398 Other sequelae of cerebral infarction: Secondary | ICD-10-CM

## 2018-10-06 LAB — CBC
HCT: 28.1 % — ABNORMAL LOW (ref 36.0–46.0)
Hemoglobin: 9.5 g/dL — ABNORMAL LOW (ref 12.0–15.0)
MCH: 32.5 pg (ref 26.0–34.0)
MCHC: 33.8 g/dL (ref 30.0–36.0)
MCV: 96.2 fL (ref 80.0–100.0)
Platelets: 266 10*3/uL (ref 150–400)
RBC: 2.92 MIL/uL — ABNORMAL LOW (ref 3.87–5.11)
RDW: 12.1 % (ref 11.5–15.5)
WBC: 8.3 10*3/uL (ref 4.0–10.5)
nRBC: 0 % (ref 0.0–0.2)

## 2018-10-06 LAB — BASIC METABOLIC PANEL
Anion gap: 10 (ref 5–15)
BUN: 12 mg/dL (ref 8–23)
CO2: 23 mmol/L (ref 22–32)
Calcium: 7.3 mg/dL — ABNORMAL LOW (ref 8.9–10.3)
Chloride: 105 mmol/L (ref 98–111)
Creatinine, Ser: 1.36 mg/dL — ABNORMAL HIGH (ref 0.44–1.00)
GFR calc Af Amer: 41 mL/min — ABNORMAL LOW (ref >60)
GFR calc non Af Amer: 35 mL/min — ABNORMAL LOW (ref >60)
Glucose, Bld: 111 mg/dL — ABNORMAL HIGH (ref 70–99)
Potassium: 3.3 mmol/L — ABNORMAL LOW (ref 3.5–5.1)
Sodium: 138 mmol/L (ref 135–145)

## 2018-10-06 LAB — PROTIME-INR
INR: 2.17
Prothrombin Time: 23.8 seconds — ABNORMAL HIGH (ref 11.4–15.2)

## 2018-10-06 MED ORDER — LEVOTHYROXINE SODIUM 25 MCG PO TABS
25.0000 ug | ORAL_TABLET | Freq: Every day | ORAL | Status: DC
  Administered 2018-10-07 – 2018-10-12 (×6): 25 ug via ORAL
  Filled 2018-10-06 (×6): qty 1

## 2018-10-06 MED ORDER — SORBITOL 70 % SOLN: 30.0000 mL | Freq: Every day | Status: DC | PRN

## 2018-10-06 MED ORDER — LEVOTHYROXINE SODIUM 25 MCG PO TABS: 25.0000 ug | ORAL_TABLET | Freq: Every day | ORAL | Status: DC

## 2018-10-06 MED ORDER — DIAZEPAM 2 MG PO TABS: 2.0000 mg | ORAL_TABLET | Freq: Two times a day (BID) | ORAL | Status: DC | PRN

## 2018-10-06 MED ORDER — ATORVASTATIN CALCIUM 20 MG PO TABS: 10.0000 mg | ORAL_TABLET | Freq: Every day | ORAL | Status: DC

## 2018-10-06 MED ORDER — NADOLOL 20 MG PO TABS
10.0000 mg | ORAL_TABLET | Freq: Every day | ORAL | Status: DC
  Administered 2018-10-07 – 2018-10-12 (×6): 10 mg via ORAL
  Filled 2018-10-06 (×6): qty 1

## 2018-10-06 MED ORDER — NADOLOL 20 MG PO TABS: 10.0000 mg | ORAL_TABLET | Freq: Every day | ORAL | Status: DC

## 2018-10-06 MED ORDER — ACETAMINOPHEN 325 MG PO TABS: 650.0000 mg | ORAL_TABLET | ORAL | Status: AC | PRN

## 2018-10-06 MED ORDER — AMLODIPINE BESYLATE 2.5 MG PO TABS
2.5000 mg | ORAL_TABLET | Freq: Every day | ORAL | Status: DC
  Administered 2018-10-07 – 2018-10-12 (×6): 2.5 mg via ORAL
  Filled 2018-10-06 (×6): qty 1

## 2018-10-06 MED ORDER — POTASSIUM CHLORIDE CRYS ER 10 MEQ PO TBCR
10.0000 meq | EXTENDED_RELEASE_TABLET | Freq: Every day | ORAL | Status: DC
  Administered 2018-10-07 – 2018-10-12 (×6): 10 meq via ORAL
  Filled 2018-10-06 (×6): qty 1

## 2018-10-06 MED ORDER — ATORVASTATIN CALCIUM 10 MG PO TABS
10.0000 mg | ORAL_TABLET | Freq: Every day | ORAL | Status: DC
  Administered 2018-10-06 – 2018-10-11 (×6): 10 mg via ORAL
  Filled 2018-10-06 (×6): qty 1

## 2018-10-06 MED ORDER — POTASSIUM CHLORIDE CRYS ER 10 MEQ PO TBCR: 10.0000 meq | EXTENDED_RELEASE_TABLET | Freq: Every day | ORAL | Status: DC

## 2018-10-06 MED ORDER — ACETAMINOPHEN 325 MG PO TABS
650.0000 mg | ORAL_TABLET | ORAL | Status: DC | PRN
  Administered 2018-10-07 – 2018-10-10 (×2): 650 mg via ORAL
  Filled 2018-10-06 (×2): qty 2

## 2018-10-06 NOTE — Progress Notes (Signed)
Pt admitted to 4W with family at bedside. Pt alert and complains of no pain. Pt in bed with call bell within reach and bed alarm in place. Continue plan of care.

## 2018-10-06 NOTE — Progress Notes (Signed)
Physical Therapy Treatment Patient Details Name: [name] MRN: [id number] DOB: 07/26/1930 Today's Date: 10/06/2018    History of Present Illness 84 y.o. female admitted on 10/03/18 for aphasia and R sided weakness.  Pt dx with L MCA infarct due to L M2 occlusion s/p IR attempted revascularization, but unsuccessful.  Pt also with persistent A-fib and HTN.  Pt with significant PMH of HTN, DM, CVA (per daughter no significant residual issues from prior stroke), A-fib on chronic anticoagulation, back surgery, and bil UE fractures due to falls.    PT Comments    Pt making steady progress with functional mobility, requiring less physical assistance for bed mobility and transfers as compared to previous session. Plan is for pt to d/c to CIR later today. Pt would continue to benefit from skilled physical therapy services at this time while admitted and after d/c to address the below listed limitations in order to improve overall safety and independence with functional mobility.   Follow Up Recommendations  CIR     Equipment Recommendations  Other (comment)(defer to next venue)    Recommendations for Other Services       Precautions / Restrictions Precautions Precautions: Fall;Other (comment) Precaution Comments: chronic back pain for which she takes daily narcotics Restrictions Weight Bearing Restrictions: No    Mobility  Bed Mobility Overal bed mobility: Needs Assistance Bed Mobility: Supine to Sit     Supine to sit: Min guard     General bed mobility comments: min guard for safety, multimodal cueing  Transfers Overall transfer level: Needs assistance Equipment used: Rolling walker (2 wheeled) Transfers: Sit to/from BJ'sStand;Stand Pivot Transfers Sit to Stand: Min assist;+2 safety/equipment Stand pivot transfers: Min assist;+2 safety/equipment       General transfer comment: cueing for safe hand placement, assist to rise into standing from EOB and for pivotal  movements to chair  Ambulation/Gait             General Gait Details: pt adamantly refusing to attempt ambulation   Stairs             Wheelchair Mobility    Modified Rankin (Stroke Patients Only) Modified Rankin (Stroke Patients Only) Pre-Morbid Rankin Score: Moderately severe disability Modified Rankin: Moderately severe disability     Balance Overall balance assessment: Needs assistance Sitting-balance support: Feet supported Sitting balance-Leahy Scale: Fair     Standing balance support: Bilateral upper extremity supported Standing balance-Leahy Scale: Poor                              Cognition Arousal/Alertness: Awake/alert Behavior During Therapy: Flat affect Overall Cognitive Status: Impaired/Different from baseline Area of Impairment: Orientation;Attention;Memory;Following commands;Safety/judgement;Problem solving                 Orientation Level: Disoriented to;Place;Time;Situation Current Attention Level: Sustained Memory: Decreased recall of precautions;Decreased short-term memory Following Commands: Follows one step commands with increased time;Follows one step commands consistently Safety/Judgement: Decreased awareness of safety;Decreased awareness of deficits   Problem Solving: Requires verbal cues;Requires tactile cues;Difficulty sequencing        Exercises      General Comments        Pertinent Vitals/Pain Pain Assessment: Faces Faces Pain Scale: Hurts even more Pain Location: back, chronic Pain Descriptors / Indicators: Grimacing;Guarding Pain Intervention(s): Repositioned;Monitored during session    Home Living Family/patient expects to be discharged to:: Private residence Living Arrangements: Spouse/significant other  Prior Function            PT Goals (current goals can now be found in the care plan section) Acute Rehab PT Goals PT Goal Formulation: With patient/family Time  For Goal Achievement: 10/19/18 Potential to Achieve Goals: Good Progress towards PT goals: Progressing toward goals    Frequency    Min 4X/week      PT Plan Current plan remains appropriate    Co-evaluation              AM-PAC PT "6 Clicks" Mobility   Outcome Measure  Help needed turning from your back to your side while in a flat bed without using bedrails?: None Help needed moving from lying on your back to sitting on the side of a flat bed without using bedrails?: None Help needed moving to and from a bed to a chair (including a wheelchair)?: A Little Help needed standing up from a chair using your arms (e.g., wheelchair or bedside chair)?: A Little Help needed to walk in hospital room?: A Lot Help needed climbing 3-5 steps with a railing? : Total 6 Click Score: 17    End of Session Equipment Utilized During Treatment: Gait belt Activity Tolerance: Patient tolerated treatment well Patient left: in chair;with call bell/phone within reach;with family/visitor present Nurse Communication: Mobility status PT Visit Diagnosis: Difficulty in walking, not elsewhere classified (R26.2);Other symptoms and signs involving the nervous system (R29.898);Repeated falls (R29.6)     Time: [id number] PT Time Calculation (min) (ACUTE ONLY): 16 min  Charges:  $Therapeutic Activity: 8-22 mins                     Deborah ChalkJennifer Shiquan Mathieu, [place]PT, DPT  Acute Rehabilitation Services Pager [phone] Office [phone]     Janice BevelsJennifer M Zykeria Martinez 10/06/2018, 3:30 PM

## 2018-10-06 NOTE — H&P (Signed)
Physical Medicine and Rehabilitation Admission H&P       Chief Complaint  Patient presents with  . Code Stroke  : HPI: [name] is an 84 year old right handed female with history of hypertension, hyperlipidemia, diet controlled diabetes mellitus, CVA 10 years ago  on chronic anticoagulation secondary to atrial fibrillation followed by Dr. Duke Salviaandolph cardiology services,chronic back pain on prn percocet .Marland Kitchen. Per chart review, patient lives with spouse. One level home with 2 steps to entry. Patient required a cane for mobility but required some hand on assistance for transfers also. Presented 10/03/2018 with sudden onset of right-sided weakness and speech difficulty. INR on admission of 2.52.Cranial CT scan reviewed, unremarkable for acute intracranial process. CT angiogram of head and neck showed left M2 branch occlusion. MRI acute infarction left MCA territory. No associated hemorrhage. Interventional radiology attempt revascularization of M2 occlusion but unsuccessful. Echocardiogram with ejection fraction of 60%. Systolic function was normal. Neurology follow-up plan was for INR to be less than 2.00 and then begin ELIQUIS in place of Coumadin. Tolerating a regular diet. Therapy evaluations completed with recommendations of physical medicine rehabilitation consult. Patient was admitted for a comprehensive rehabilitation program.  Review of Systems  Constitutional: Negative for fever.  HENT: Negative for hearing loss.   Eyes: Negative for blurred vision and double vision.  Respiratory: Negative for cough and shortness of breath.   Cardiovascular: Positive for palpitations. Negative for chest pain and leg swelling.  Gastrointestinal: Positive for constipation. Negative for nausea and vomiting.  Genitourinary: Negative for dysuria, flank pain and hematuria.  Musculoskeletal: Positive for joint pain and myalgias.  Skin: Negative for rash.  Neurological: Positive for speech change and  focal weakness.  All other systems reviewed and are negative.      Past Medical History:  Diagnosis Date  . Atrial fibrillation (HCC)   . Chronic anticoagulation   . Chronic anticoagulation   . CVA (cerebrovascular accident) (HCC)   . Diabetes mellitus   . History of chicken pox   . Hyperlipidemia   . Hypertension         Past Surgical History:  Procedure Laterality Date  . APPENDECTOMY    . BACK SURGERY    . CARDIAC CATHETERIZATION  11/09/91   EF 70%  . DILATION AND CURETTAGE OF UTERUS    . IR ANGIO INTRA EXTRACRAN SEL COM CAROTID INNOMINATE UNI L MOD SED  10/03/2018  . RADIOLOGY WITH ANESTHESIA N/A 10/03/2018   Procedure: IR WITH ANESTHESIA;  Surgeon: Radiologist, Medication, MD;  Location: [facility] OR;  Service: Radiology;  Laterality: N/A;        Family History  Problem Relation Age of Onset  . Heart disease Mother   . Hypertension Mother   . Stroke Mother   . Heart disease Father   . Heart failure Brother   . Osteoporosis Neg Hx    Social History:  reports that she quit smoking about 19 years ago. She has never used smokeless tobacco. She reports that she does not drink alcohol or use drugs. Allergies:       Allergies  Allergen Reactions  . Cozaar Other (See Comments)    "Almost passed out"  . Hydralazine Other (See Comments)    "almost passed out"  . Hyzaar [Losartan Potassium-Hctz] Other (See Comments)    "almost passed out"  . Sulfa Drugs Cross Reactors Other (See Comments)    "turned me blue"   . Lisinopril     Unknown reaction, per pt  Medications Prior to Admission  Medication Sig Dispense Refill  . acetaminophen (TYLENOL) 500 MG tablet Take 1,000 mg by mouth every 6 (six) hours as needed for mild pain.    Marland Kitchen. amLODipine (NORVASC) 2.5 MG tablet Take 2.5 mg by mouth daily.  1  . atorvastatin (LIPITOR) 20 MG tablet TAKE 1/2 (ONE-HALF) TABLET BY MOUTH ONCE DAILY IN THE MORNING (Patient taking differently: Take  10 mg by mouth daily. ) 30 tablet 6  . diazepam (VALIUM) 2 MG tablet TAKE 1 TABLET BY MOUTH TWICE DAILY AS NEEDED FOR ANXIETY (Patient taking differently: Take 2 mg by mouth 2 (two) times daily as needed for anxiety. ) 90 tablet 0  . furosemide (LASIX) 20 MG tablet Take 10 mg by mouth as needed for fluid or edema.    Marland Kitchen. levothyroxine (SYNTHROID, LEVOTHROID) 25 MCG tablet TAKE 1 TABLET BY MOUTH ONCE DAILY BEFORE  BREAKFAST (Patient taking differently: Take 25 mcg by mouth daily before breakfast. ) 90 tablet 0  . nadolol (CORGARD) 20 MG tablet TAKE 1/2 (ONE-HALF) TABLET BY MOUTH ONCE DAILY (Patient taking differently: Take 10 mg by mouth daily. ) 45 tablet 1  . nitroGLYCERIN (NITROSTAT) 0.4 MG SL tablet Place 0.4 mg under the tongue every 5 (five) minutes as needed (chest pain).     Marland Kitchen. oxyCODONE-acetaminophen (PERCOCET/ROXICET) 5-325 MG tablet Take 1 tablet by mouth every 4 (four) hours as needed for severe pain.     . potassium chloride (K-DUR,KLOR-CON) 10 MEQ tablet TAKE 1 TABLET BY MOUTH ONCE DAILY (Patient taking differently: Take 10 mEq by mouth daily. ) 90 tablet 1  . warfarin (COUMADIN) 5 MG tablet TAKE 1/2 TO 1 (ONE-HALF TO ONE) TABLET BY MOUTH ONCE DAILY AS DIRECTED BY  COUMADIN  CLINIC (Patient taking differently: Take 2.5-5 mg by mouth every morning. Take 2.5mg  daily on Sun/Tues/Wed/Thurs/Sat and 5mg  on Mon & Fri.) 90 tablet 1    Drug Regimen Review Drug regimen was reviewed and remains appropriate with no significant issues identified  Home: Home Living Family/patient expects to be discharged to:: Private residence Living Arrangements: Spouse/significant other Available Help at Discharge: Family, Available 24 hours/day Type of Home: House Home Access: Stairs to enter Entergy CorporationEntrance Stairs-Number of Steps: 2 Entrance Stairs-Rails: None Home Layout: One level Bathroom Shower/Tub: Tub/shower unit(garden tub) Bathroom Toilet: Standard Home Equipment: Environmental consultantWalker - 2 wheels, Cane - single  point, Psychiatric nursehower seat(lift chair)  Lives With: Spouse   Functional History: Prior Function Level of Independence: Needs assistance Gait / Transfers Assistance Needed: used cane for mobility but requires hands on assist and min assist to transfer at times.  Also walks flexed forward due to chronic back pain.  ADL's / Homemaking Assistance Needed: some assist for bathing and dressing, steadying assist for toileting  Comments: hx of B arm fxs and limited ROM UEs  Functional Status:  Mobility: Bed Mobility Overal bed mobility: Needs Assistance Bed Mobility: Rolling, Sidelying to Sit, Sit to Supine Rolling: +2 for physical assistance, Min assist Sidelying to sit: Max assist, +2 for physical assistance Sit to supine: Max assist, +2 for physical assistance General bed mobility comments: Pt rolling towards L side with min assist for safety, limited bed mobility by pain and highly resistive to movement.  Patient's spouse assisted as she was less resistive to have him assist but requires heavy max assist +2 to transition from sidelying to sitting.  Transfers Overall transfer level: Needs assistance Equipment used: 2 person hand held assist Transfers: Sit to/from Stand Sit to Stand: +2  physical assistance, Mod assist General transfer comment: +2 assist for sit to stand and side stepping to [facility]-- see PT noted for details  Ambulation/Gait Ambulation/Gait assistance: Mod assist, +2 physical assistance Gait Distance (Feet): 3 Feet Assistive device: 2 person hand held assist Gait Pattern/deviations: Step-to pattern General Gait Details: side stepped to [facility] for better positioning when returning to supine.    ADL: ADL Overall ADL's : Needs assistance/impaired Eating/Feeding: NPO Grooming: Wash/dry hands, Wash/dry face, Maximal assistance, Sitting Grooming Details (indicate cue type and reason): able to wash face but requires assist to complete hand washing  Upper Body Bathing: Moderate assistance,  Sitting Lower Body Bathing: Maximal assistance, +2 for physical assistance, +2 for safety/equipment, Sit to/from stand Upper Body Dressing : Maximal assistance, Sitting Lower Body Dressing: Total assistance, +2 for physical assistance, Sit to/from stand Toilet Transfer Details (indicate cue type and reason): deferred due to pain Toileting- Clothing Manipulation and Hygiene: Total assistance, +2 for physical assistance, Sit to/from stand Toileting - Clothing Manipulation Details (indicate cue type and reason): for hygiene after soiling bed, total assist  Functional mobility during ADLs: Maximal assistance, +2 for physical assistance General ADL Comments: patient limited during session by expressive difficulties, chronic back pain, and genearlized weakness   Cognition: Cognition Overall Cognitive Status: Difficult to assess Orientation Level: Oriented to person Cognition Arousal/Alertness: Awake/alert Behavior During Therapy: Flat affect Overall Cognitive Status: Difficult to assess General Comments: pt able to recognize family, able to follow simple commands with fair consistency . Difficult to assess due to: Impaired communication  Physical Exam: Blood pressure 107/73, pulse (!) 107, temperature 99.3 F (37.4 C), temperature source Oral, resp. rate 18, height 5\' 2"  (1.575 m), weight 52.3 kg, SpO2 98 %. Physical Exam  Vitals reviewed. HENT:  Head: Normocephalic.  Eyes:  Pupils reactive to light  Neck: Normal range of motion. Neck supple. No thyromegaly present.  Cardiovascular:  Cardiac rate controlled  Respiratory: Effort normal and breath sounds normal. No respiratory distress.  GI: Soft. Bowel sounds are normal. She exhibits no distension.  Neurological: She is alert.  Global aphasia.Inconsistent to follow commands  Skin: Skin is warm and dry.  Neuro:  Eyes without evidence of nystagmus  Tone is normal without evidence of spasticity Cerebellar exam shows no evidence of  ataxia  No evidence of trunkal ataxia  Motor strength is 5/5 in LEFT deltoid, biceps, triceps, finger flexors and extensors, wrist flexors and extensors, hip flexors, knee flexors and extensors, ankle dorsiflexors, plantar flexors, invertors and evertors, toe flexors and extensors, 4+/5 on RIgh tside  Sensory exam limited by aphasia Cranial nerves II- Visual fields are intact to confrontation testing, no blurring of vision III- no evidence of ptosis, upward, downward and medial gaze intact IV- no head tilt V- ? facial numbness aphasic or masseter weakness VI- no pupil abduction weakness VII- no facial droop, good lid closure VII-?  normal auditory acuity-aphasic IX- no pharygeal weakness, X- no pharyngeal weakness, no hoarseness XI- no trap or SCM weakness XII- no glossal weakness    LabResultsLast48Hours        Results for orders placed or performed during the hospital encounter of 10/03/18 (from the past 48 hour(s))  Type and screen [facility]     Status: None   Collection Time: 10/04/18  8:54 AM  Result Value Ref Range   ABO/RH(D) B POS    Antibody Screen NEG    Sample Expiration 10/07/2018   ABO/Rh     Status: None  Collection Time: 10/04/18  8:54 AM  Result Value Ref Range   ABO/RH(D)      B POS Performed at [facility], [address], [place], [place] [address]   CBC     Status: Abnormal   Collection Time: 10/04/18  8:56 AM  Result Value Ref Range   WBC 14.2 (H) 4.0 - 10.5 K/uL   RBC 3.76 (L) 3.87 - 5.11 MIL/uL   Hemoglobin 11.5 (L) 12.0 - 15.0 g/dL   HCT 60.4 54.0 - 98.1 %   MCV 100.3 (H) 80.0 - 100.0 fL   MCH 30.6 26.0 - 34.0 pg   MCHC 30.5 30.0 - 36.0 g/dL   RDW 19.1 47.8 - 29.5 %   Platelets 285 150 - 400 K/uL   nRBC 0.0 0.0 - 0.2 %    Comment: Performed at [facility], [address] [place], [place] [address]  Protime-INR     Status: Abnormal   Collection Time: 10/04/18  8:56 AM   Result Value Ref Range   Prothrombin Time 32.9 (H) 11.4 - 15.2 seconds   INR 3.27     Comment: Performed at [facility], [address], [place], [place] [address]  Basic metabolic panel     Status: Abnormal   Collection Time: 10/04/18  8:56 AM  Result Value Ref Range   Sodium 139 135 - 145 mmol/L   Potassium 4.4 3.5 - 5.1 mmol/L   Chloride 108 98 - 111 mmol/L   CO2 20 (L) 22 - 32 mmol/L   Glucose, Bld 135 (H) 70 - 99 mg/dL   BUN 21 8 - 23 mg/dL   Creatinine, Ser 4.69 (H) 0.44 - 1.00 mg/dL   Calcium 7.7 (L) 8.9 - 10.3 mg/dL   GFR calc non Af Amer 31 (L) >60 mL/min   GFR calc Af Amer 36 (L) >60 mL/min   Anion gap 11 5 - 15    Comment: Performed at [facility], [address], [place], [place] [address]  CBC     Status: Abnormal   Collection Time: 10/05/18  4:10 AM  Result Value Ref Range   WBC 10.1 4.0 - 10.5 K/uL   RBC 3.04 (L) 3.87 - 5.11 MIL/uL   Hemoglobin 9.7 (L) 12.0 - 15.0 g/dL   HCT 84.1 (L) 32.4 - 40.1 %   MCV 98.0 80.0 - 100.0 fL   MCH 31.9 26.0 - 34.0 pg   MCHC 32.6 30.0 - 36.0 g/dL   RDW 02.7 25.3 - 66.4 %   Platelets 263 150 - 400 K/uL   nRBC 0.0 0.0 - 0.2 %    Comment: Performed at [facility], [address], [place], [place] [address]  Basic metabolic panel     Status: Abnormal   Collection Time: 10/05/18  4:10 AM  Result Value Ref Range   Sodium 139 135 - 145 mmol/L   Potassium 3.8 3.5 - 5.1 mmol/L   Chloride 106 98 - 111 mmol/L   CO2 21 (L) 22 - 32 mmol/L   Glucose, Bld 114 (H) 70 - 99 mg/dL   BUN 16 8 - 23 mg/dL   Creatinine, Ser 4.25 (H) 0.44 - 1.00 mg/dL   Calcium 7.3 (L) 8.9 - 10.3 mg/dL   GFR calc non Af Amer 33 (L) >60 mL/min   GFR calc Af Amer 38 (L) >60 mL/min   Anion gap 12 5 - 15    Comment: Performed at [employer]  [facility], [address], [place], [place] [address]  Protime-INR     Status: Abnormal   Collection Time: 10/05/18  4:10 AM  Result Value Ref Range    Prothrombin Time 30.6 (H) 11.4 - 15.2 seconds   INR 2.99     Comment: Performed at [facility], [address] [place], [place] [address]  CBC     Status: Abnormal   Collection Time: 10/06/18  5:08 AM  Result Value Ref Range   WBC 8.3 4.0 - 10.5 K/uL   RBC 2.92 (L) 3.87 - 5.11 MIL/uL   Hemoglobin 9.5 (L) 12.0 - 15.0 g/dL   HCT 09.8 (L) 11.9 - 14.7 %   MCV 96.2 80.0 - 100.0 fL   MCH 32.5 26.0 - 34.0 pg   MCHC 33.8 30.0 - 36.0 g/dL   RDW 82.9 56.2 - 13.0 %   Platelets 266 150 - 400 K/uL   nRBC 0.0 0.0 - 0.2 %    Comment: Performed at [facility], [address] [place], [place] [address]  Basic metabolic panel     Status: Abnormal   Collection Time: 10/06/18  5:08 AM  Result Value Ref Range   Sodium 138 135 - 145 mmol/L   Potassium 3.3 (L) 3.5 - 5.1 mmol/L   Chloride 105 98 - 111 mmol/L   CO2 23 22 - 32 mmol/L   Glucose, Bld 111 (H) 70 - 99 mg/dL   BUN 12 8 - 23 mg/dL   Creatinine, Ser 4.69 (H) 0.44 - 1.00 mg/dL   Calcium 7.3 (L) 8.9 - 10.3 mg/dL   GFR calc non Af Amer 35 (L) >60 mL/min   GFR calc Af Amer 41 (L) >60 mL/min   Anion gap 10 5 - 15    Comment: Performed at [facility], [address] [place], [place] [address]  Protime-INR     Status: Abnormal   Collection Time: 10/06/18  5:08 AM  Result Value Ref Range   Prothrombin Time 23.8 (H) 11.4 - 15.2 seconds   INR 2.17     Comment: Performed at [facility], [address], [place], [place] [address]      ImagingResults(Last48hours)  Mr Brain 67 Contrast  Result Date: 10/04/2018 CLINICAL DATA:  Stroke.  Aphasia left-sided weakness EXAM: MRI HEAD WITHOUT CONTRAST TECHNIQUE: Multiplanar, multiecho pulse sequences of the brain and surrounding structures were obtained without intravenous contrast. COMPARISON:  CTA 10/03/2018 FINDINGS: Brain: Acute infarct in the left parietal lobe involving cortex and white matter. Small areas of acute infarct extend  into the posterior insula and left frontal white matter. Moderate atrophy. Chronic ischemic changes in the pons bilaterally and in the right cerebellum. Chronic ischemic changes throughout the cerebral white matter bilaterally. Negative for hemorrhage mass or midline shift. Vascular: Negative for hyperdense vessel Skull and upper cervical spine: Negative Sinuses/Orbits: Mild mucosal edema paranasal sinuses. Bilateral cataract surgery Other: None IMPRESSION: Acute infarct left MCA territory. Largest area infarction is in the left parietal lobe with extension into the insula and left frontal lobe. No associated hemorrhage Atrophy and chronic ischemic changes as above. Electronically Signed   By: Marlan Palau M.D.   On: 10/04/2018 13:48        Medical Problem List and Plan: 1.   Right side weakness and aphasia secondary to left MCA infarction due to left M2 occlusion status post IR attempts at revascularization unsuccessful 2.  DVT Prophylaxis/Anticoagulation: CHECK INR DAILY AND  BEGIN ELIQUIS 2.5 mg BID ONCE INR <2.00 3. Pain Management/chronic back pain:  Patient on Percocet prior to admission. Will  monitor with increased mobility and resume as needed versus low-dose tramadol 4. Mood:  Provide emotional support 5. Neuropsych: This patient is capable of making decisions on her own behalf. 6. Skin/Wound Care:  Routine skin checks 7. Fluids/Electrolytes/Nutrition:  Routine ins and outs with follow-up chemistries 8. Hypertension. Norvasc 2.5 mg daily, Corgard 10 mg daily. Monitor with increased mobility 9. Atrial fibrillation. Cardiac rate controlled. Followed by Dr. Duke Salvia with cardiology services 10. Hyperlipidemia. Lipitor 11. Hypothyroidism. Synthroid 12.  Diet controlled diabetes mellitus. Hemoglobin A1c 5.2.  Post Admission Physician Evaluation: 1. Functional deficits secondary  to Left MCA infarct with Right hemiparesis and aphasia. 2. Patient admitted to receive collaborative,  interdisciplinary care between the physiatrist, rehab nursing staff, and therapy team. 3. Patient's level of medical complexity and substantial therapy needs in context of that medical necessity cannot be provided at a lesser intensity of care. 4. Patient has experienced substantial functional loss from his/her baseline. Upon functional assessment at the time of the preadmission screening, patient was Max assist mobility, min assist bed mobility, transfers mod assist x2, 5. ADLs max assist with grooming, +2 total for lower body ADLs  Upon most recent functional evaluation, patient was Max assist mobility, min assist bed mobility, transfers mod assist x2, 6. ADLs max assist with grooming, +2 total for lower body ADLs.  Judging by the patient's diagnosis, physical exam, and functional history, the patient has potential for functional progress which will result in measurable gains while on inpatient rehab.  These gains will be of substantial and practical use upon discharge in facilitating mobility and self-care at the household level. 7. Physiatrist will provide 24 hour management of medical needs as well as oversight of the therapy plan/treatment and provide guidance as appropriate regarding the interaction of the two. 8. 24 hour rehab nursing will assist in the management of  bladder management, bowel management, safety, skin/wound care, disease management, medication administration, pain management and patient education  and help integrate therapy concepts, techniques,education, etc. 9. PT will assess and treat for:pre gait, gait training, endurance , safety, equipment, neuromuscular re education  .  Goals are: supervision. 10. OT will assess and treat for ADLs, Cognitive perceptual skills, Neuromuscular re education, safety, endurance, equipment  .  Goals are: minimal assist.  11. SLP will assess and treat for  .  Language, swallowing, cognition goals are: minimal assist. 12. Case Management and Social  Worker will assess and treat for psychological issues and discharge planning. 13. Team conference will be held weekly to assess progress toward goals and to determine barriers to discharge. 14.  Patient will receive at least 3 hours of therapy per day at least 5 days per week. 15. ELOS and Prognosis: 21 to 24 days good  "I have personally performed a face to face diagnostic evaluation of this patient.  Additionally, I have reviewed and concur with the physician assistant's documentation above."  Erick Colace M.D. [facility] FAAPM&R (Sports Med, Neuromuscular Med) Diplomate Am Board of Electrodiagnostic Med   Charlton Amor, PA-C 10/06/2018

## 2018-10-06 NOTE — PMR Pre-admission (Signed)
PMR Admission Coordinator Pre-Admission Assessment  Patient: [name] is an 84 y.o., female MRN: [id number] DOB: August 09, 1930 Height: 5\' 2"  (157.5 cm) Weight: 52.3 kg              Insurance Information HMO:     PPO:      PCP:      IPA:      80/20:      OTHER:  PRIMARY: Medicare a and b      Policy#: [id number]      Subscriber: pt Benefits:  Phone #: online     Name: 10/06/2018 Eff. Date: 12/17/1996     Deduct: $1364      Out of Pocket Max: none      Life Max: none CIR: 100%      SNF: 20 full days Outpatient: 80%     Co-Pay: 20% Home Health: 100%      Co-Pay: none DME: 80%     Co-Pay: 20% Providers: pt choice  SECONDARY: A[employer] supplement      Policy#: [id number]      Subscriber: pt  Medicaid Application Date:       Case Manager:  Disability Application Date:       Case Worker:   Emergency Contact Information Contact Information    Name Relation Home Work Mobile   [place],Kenneth Spouse [phone]  [phone]   Whwite,Cynthia Daughter [phone]       Current Medical History  Patient Admitting Diagnosis: left MCA territory infarct  History of Present Illness: [name] is an 84 year old right handed female with history of hypertension, hyperlipidemia, diet controlled diabetes mellitus, CVA 10 years ago  on chronic anticoagulation secondary to atrial fibrillation followed by Dr. Duke Salviaandolph cardiology services,chronic back pain on prn percocet . Presented 10/03/2018 with sudden onset of right-sided weakness and speech difficulty. INR on admission of 2.52.Cranial CT scan reviewed, unremarkable for acute intracranial process. CT angiogram of head and neck showed left M2 branch occlusion. MRI acute infarction left MCA territory. No associated hemorrhage. Interventional radiology attempt revascularization of M2 occlusion but unsuccessful. Echocardiogram with ejection fraction of 60%. Systolic function was normal. Neurology follow-up plan was for INR to be less  than 2.00 and then begin ELIQUIS in place of Coumadin. Tolerating a regular diet.   Complete NIHSS TOTAL: 4    Past Medical History  Past Medical History:  Diagnosis Date  . Atrial fibrillation (HCC)   . Chronic anticoagulation   . Chronic anticoagulation   . CVA (cerebrovascular accident) (HCC)   . Diabetes mellitus   . History of chicken pox   . Hyperlipidemia   . Hypertension     Family History  family history includes Heart disease in her father and mother; Heart failure in her brother; Hypertension in her mother; Stroke in her mother.  Prior Rehab/Hospitalizations:  Has the patient had major surgery during 100 days prior to admission? No  Current Medications   Current Facility-Administered Medications:  .   stroke: mapping our early stages of recovery book, , Does not apply, Once, Ulice DashSmith, David R, PA-C .  0.9 %  sodium chloride infusion, , Intravenous, Continuous, Marvel PlanXu, Jindong, MD, Last Rate: 50 mL/hr at 10/05/18 2241 .  acetaminophen (TYLENOL) tablet 650 mg, 650 mg, Oral, Q4H PRN **OR** [DISCONTINUED] acetaminophen (TYLENOL) solution 650 mg, 650 mg, Per Tube, Q4H PRN **OR** [DISCONTINUED] acetaminophen (TYLENOL) suppository 650 mg, 650 mg, Rectal, Q4H PRN, Deveshwar, Sanjeev, MD .  amLODipine (NORVASC) tablet 2.5 mg, 2.5 mg, Oral,  Daily, Marvel Plan, MD, 2.5 mg at 10/06/18 1036 .  atorvastatin (LIPITOR) tablet 10 mg, 10 mg, Oral, q1800, Ulice Dash, PA-C, 10 mg at 10/05/18 1805 .  levothyroxine (SYNTHROID, LEVOTHROID) tablet 25 mcg, 25 mcg, Oral, Q0600, Ulice Dash, PA-C, 25 mcg at 10/06/18 1610 .  nadolol (CORGARD) tablet 10 mg, 10 mg, Oral, Daily, Marvel Plan, MD, 10 mg at 10/06/18 1037 .  potassium chloride (K-DUR,KLOR-CON) CR tablet 10 mEq, 10 mEq, Oral, Daily, Ulice Dash, PA-C, 10 mEq at 10/06/18 1037  Patients Current Diet:  Diet Order            Diet regular Room service appropriate? Yes; Fluid consistency: Thin  Diet effective now               Precautions / Restrictions Precautions Precautions: Fall, Other (comment) Precaution Comments: chronic back pain for which she takes daily narcotics Restrictions Weight Bearing Restrictions: No   Has the patient had 2 or more falls or a fall with injury in the past year?Yes  Prior Activity Level Limited Community (1-2x/wk): supervision with cane pta. Spouse assist with CGA mobility and adls  Home Assistive Devices / Equipment Home Assistive Devices/Equipment: Cane (specify quad or straight) Home Equipment: Walker - 2 wheels, Cane - single point, Psychiatric nurse)  Prior Device Use: Indicate devices/aids used by the patient prior to current illness, exacerbation or injury? cane  Prior Functional Level Prior Function Level of Independence: (spouse asisstw with ambulation and adls due to balance issue) Gait / Transfers Assistance Needed: used cane for mobility but requires hands on assist and min assist to transfer at times.  Also walks flexed forward due to chronic back pain.  ADL's / Homemaking Assistance Needed: some assist for bathing and dressing, steadying assist for toileting  Comments: hx of B arm fxs and limited ROM UEs  Self Care: Did the patient need help bathing, dressing, using the toilet or eating?  Needed some help  Indoor Mobility: Did the patient need assistance with walking from room to room (with or without device)? Needed some help  Stairs: Did the patient need assistance with internal or external stairs (with or without device)? Needed some help  Functional Cognition: Did the patient need help planning regular tasks such as shopping or remembering to take medications? Needed some help  Current Functional Level Cognition  Overall Cognitive Status: Difficult to assess Difficult to assess due to: Impaired communication Orientation Level: Oriented to person General Comments: pt able to recognize family, able to follow simple commands with fair  consistency .    Extremity Assessment (includes Sensation/Coordination)  Upper Extremity Assessment: RUE deficits/detail, LUE deficits/detail RUE Deficits / Details: grossly 3+/5 MMT, limited shoulder flexion to approx 90 due to hx of injuries  LUE Deficits / Details: grossly 3+/5 MMT, limited shoulder flexion to approx 90 due to hx of injuries   Lower Extremity Assessment: Defer to PT evaluation RLE Deficits / Details: motor strength seems to be intact bil, however, motor planning may be difficult, seems to do automatic movement more readily than when asked to bend legs, etc.  pt responding to touch bil legs, but difficult to test sensation.  No significant buckling with standing or side stepping.  RLE Sensation: WNL RLE Coordination: WNL LLE Deficits / Details: motor strength seems to be intact bil, however, motor planning may be difficult, seems to do automatic movement more readily than when asked to bend legs, etc.  pt responding to touch bil legs, but  difficult to test sensation.  No significant buckling with standing or side stepping.  LLE Sensation: WNL LLE Coordination: WNL    ADLs  Overall ADL's : Needs assistance/impaired Eating/Feeding: NPO Grooming: Wash/dry hands, Wash/dry face, Maximal assistance, Sitting Grooming Details (indicate cue type and reason): able to wash face but requires assist to complete hand washing  Upper Body Bathing: Moderate assistance, Sitting Lower Body Bathing: Maximal assistance, +2 for physical assistance, +2 for safety/equipment, Sit to/from stand Upper Body Dressing : Maximal assistance, Sitting Lower Body Dressing: Total assistance, +2 for physical assistance, Sit to/from stand Toilet Transfer Details (indicate cue type and reason): deferred due to pain Toileting- Clothing Manipulation and Hygiene: Total assistance, +2 for physical assistance, Sit to/from stand Toileting - Clothing Manipulation Details (indicate cue type and reason): for hygiene  after soiling bed, total assist  Functional mobility during ADLs: Maximal assistance, +2 for physical assistance General ADL Comments: patient limited during session by expressive difficulties, chronic back pain, and genearlized weakness     Mobility  Overal bed mobility: Needs Assistance Bed Mobility: Rolling, Sidelying to Sit, Sit to Supine Rolling: +2 for physical assistance, Min assist Sidelying to sit: Max assist, +2 for physical assistance Sit to supine: Max assist, +2 for physical assistance General bed mobility comments: Pt rolling towards L side with min assist for safety, limited bed mobility by pain and highly resistive to movement.  Patient's spouse assisted as she was less resistive to have him assist but requires heavy max assist +2 to transition from sidelying to sitting.     Transfers  Overall transfer level: Needs assistance Equipment used: 2 person hand held assist Transfers: Sit to/from Stand Sit to Stand: +2 physical assistance, Mod assist General transfer comment: +2 assist for sit to stand and side stepping to [facility]-- see PT noted for details     Ambulation / Gait / Stairs / Wheelchair Mobility  Ambulation/Gait Ambulation/Gait assistance: Mod assist, +2 physical assistance Gait Distance (Feet): 3 Feet Assistive device: 2 person hand held assist Gait Pattern/deviations: Step-to pattern General Gait Details: side stepped to [facility] for better positioning when returning to supine.      Posture / Balance Dynamic Sitting Balance Sitting balance - Comments: needs min assist EOB- limited by pain Balance Overall balance assessment: Needs assistance Sitting-balance support: Feet supported, Bilateral upper extremity supported Sitting balance-Leahy Scale: Poor Sitting balance - Comments: needs min assist EOB- limited by pain Standing balance support: Bilateral upper extremity supported Standing balance-Leahy Scale: Poor Standing balance comment: +2 assist required, reliant on  external support    Special needs/care consideration BiPAP/CPAP n/a CPM n/a Continuous Drip IV n/a Dialysis n/a Life Vest n/a Oxygen n/a Special Bed n/a Trach Size n/a Wound Vac n/a Skin ecchymosis to right arm, hand and right groin Bowel mgmt: incontinent LBM 12/18 Bladder mgmt: external catheter Diabetic mgmt n/a Poor appetite Chronic loser back pain making it an issue to get up in the morning Chronic balance/fall issues   Previous Home Environment Living Arrangements: Spouse/significant other  Lives With: Spouse Available Help at Discharge: Family, Available 24 hours/day Type of Home: House Home Layout: One level Home Access: Stairs to enter Entrance Stairs-Rails: None Entrance Stairs-Number of Steps: 2 Bathroom Shower/Tub: Engineer, manufacturing systemsTub/shower unit Bathroom Toilet: Standard Bathroom Accessibility: Yes How Accessible: Accessible via walker Home Care Services: No  Discharge Living Setting Plans for Discharge Living Setting: Patient's home, Lives with (comment)(spouse) Type of Home at Discharge: House Discharge Home Layout: One level Discharge Home Access: Stairs to enter Entrance  Stairs-Rails: None Entrance Stairs-Number of Steps: 2 Discharge Bathroom Shower/Tub: Tub/shower unit Discharge Bathroom Toilet: Standard Discharge Bathroom Accessibility: Yes How Accessible: Accessible via walker Does the patient have any problems obtaining your medications?: No  Social/Family/Support Systems Patient Roles: Spouse, Parent Contact Information: Rocky Link, spouse, married for 27 years Anticipated Caregiver: spouse and daughter, Aram Beecham Anticipated Industrial/product designer Information: see above Ability/Limitations of Caregiver: no limitations Caregiver Availability: 24/7 Discharge Plan Discussed with Primary Caregiver: Yes Is Caregiver In Agreement with Plan?: Yes Does Caregiver/Family have Issues with Lodging/Transportation while Pt is in Rehab?: No   Daughter, Aram Beecham, is Charity fundraiser and very  involved with her Mom and Step dad.  Goals/Additional Needs Patient/Family Goal for Rehab: min asisst PT, OT, and SLP Expected length of stay: ELOS 18 to 22 days Pt/Family Agrees to Admission and willing to participate: Yes Program Orientation Provided & Reviewed with Pt/Caregiver Including Roles  & Responsibilities: Yes  Decrease burden of Care through IP rehab admission: n/a  Possible need for SNF placement upon discharge:not anticipated and family prefers direct d/c home with spouse if realistic  Patient Condition: This patient's condition remains as documented in the consult dated 10/05/2018, in which the Rehabilitation Physician determined and documented that the patient's condition is appropriate for intensive rehabilitative care in an inpatient rehabilitation facility. Will admit to inpatient rehab today.  Preadmission Screen Completed By:  Clois Dupes, 10/06/2018 12:13 PM ______________________________________________________________________   Discussed status with Dr. Wynn Banker on 10/06/2018 at  1224 and received telephone approval for admission today.  Admission Coordinator:  Clois Dupes, time [id number] Date 10/06/2018

## 2018-10-06 NOTE — Progress Notes (Signed)
Standley Brooking, RN  Rehab Admission Coordinator  Physical Medicine and Rehabilitation  PMR Pre-admission  Signed  Date of Service:  10/06/2018 12:12 PM       Related encounter: ED to Hosp-Admission (Current) from 10/03/2018 in [place] [facility]      Signed         Show:Clear all [x] Manual[x] Template[x] Copied  Added by: [x] Standley Brooking, RN  [] Hover for details PMR Admission Coordinator Pre-Admission Assessment  Patient: [name] is an 84 y.o., female MRN: [id number] DOB: 1929-12-14 Height: 5\' 2"  (157.5 cm) Weight: 52.3 kg                                                                                                                                                  Insurance Information HMO:     PPO:      PCP:      IPA:      80/20:      OTHER:  PRIMARY: Medicare a and b      Policy#: [id number]      Subscriber: pt Benefits:  Phone #: online     Name: 10/06/2018 Eff. Date: 12/17/1996     Deduct: $1364      Out of Pocket Max: none      Life Max: none CIR: 100%      SNF: 20 full days Outpatient: 80%     Co-Pay: 20% Home Health: 100%      Co-Pay: none DME: 80%     Co-Pay: 20% Providers: pt choice  SECONDARY: A[employer] supplement      Policy#: [id number]      Subscriber: pt  Medicaid Application Date:       Case Manager:  Disability Application Date:       Case Worker:   Emergency Contact Information         Contact Information    Name Relation Home Work Mobile   [place] Spouse [phone]  [phone]   [place],Cynthia Daughter [phone]       Current Medical History  Patient Admitting Diagnosis: left MCA territory infarct  History of Present Illness:[place] W Marri Mcneff is an 84 year old right handed female with history of hypertension, hyperlipidemia,diet controlleddiabetes mellitus, CVA 10 years ago on chronic anticoagulation secondary to atrial fibrillation followed by Dr. Duke Salvia cardiology  services,chronic back pain on prn percocet. Presented 10/03/2018 with sudden onset of right-sided weakness and speech difficulty. INR on admission of 2.52.Cranial CT scan reviewed, unremarkable for acute intracranial process. CT angiogram of head and neck showed left M2 branch occlusion. MRI acute infarction left MCA territory. No associated hemorrhage. Interventional radiology attempt revascularization of M2 occlusion but unsuccessful. Echocardiogram with ejection fraction of 60%. Systolic function was normal. Neurology follow-upplan was for INR to be less than 2.00and then begin ELIQUIS in place of Coumadin.Tolerating a regular diet.  Complete NIHSS TOTAL: 4  Past Medical History      Past Medical History:  Diagnosis Date  . Atrial fibrillation (HCC)   . Chronic anticoagulation   . Chronic anticoagulation   . CVA (cerebrovascular accident) (HCC)   . Diabetes mellitus   . History of chicken pox   . Hyperlipidemia   . Hypertension     Family History  family history includes Heart disease in her father and mother; Heart failure in her brother; Hypertension in her mother; Stroke in her mother.  Prior Rehab/Hospitalizations:  Has the patient had major surgery during 100 days prior to admission? No  Current Medications   Current Facility-Administered Medications:  .   stroke: mapping our early stages of recovery book, , Does not apply, Once, Ulice DashSmith, David R, PA-C .  0.9 %  sodium chloride infusion, , Intravenous, Continuous, Marvel PlanXu, Jindong, MD, Last Rate: 50 mL/hr at 10/05/18 2241 .  acetaminophen (TYLENOL) tablet 650 mg, 650 mg, Oral, Q4H PRN **OR** [DISCONTINUED] acetaminophen (TYLENOL) solution 650 mg, 650 mg, Per Tube, Q4H PRN **OR** [DISCONTINUED] acetaminophen (TYLENOL) suppository 650 mg, 650 mg, Rectal, Q4H PRN, Deveshwar, Sanjeev, MD .  amLODipine (NORVASC) tablet 2.5 mg, 2.5 mg, Oral, Daily, Marvel PlanXu, Jindong, MD, 2.5 mg at 10/06/18 1036 .  atorvastatin (LIPITOR) tablet  10 mg, 10 mg, Oral, q1800, Ulice DashSmith, David R, PA-C, 10 mg at 10/05/18 1805 .  levothyroxine (SYNTHROID, LEVOTHROID) tablet 25 mcg, 25 mcg, Oral, Q0600, Ulice DashSmith, David R, PA-C, 25 mcg at 10/06/18 16100843 .  nadolol (CORGARD) tablet 10 mg, 10 mg, Oral, Daily, Marvel PlanXu, Jindong, MD, 10 mg at 10/06/18 1037 .  potassium chloride (K-DUR,KLOR-CON) CR tablet 10 mEq, 10 mEq, Oral, Daily, Ulice DashSmith, David R, PA-C, 10 mEq at 10/06/18 1037  Patients Current Diet:     Diet Order                  Diet regular Room service appropriate? Yes; Fluid consistency: Thin  Diet effective now               Precautions / Restrictions Precautions Precautions: Fall, Other (comment) Precaution Comments: chronic back pain for which she takes daily narcotics Restrictions Weight Bearing Restrictions: No   Has the patient had 2 or more falls or a fall with injury in the past year?Yes  Prior Activity Level Limited Community (1-2x/wk): supervision with cane pta. Spouse assist with CGA mobility and adls  Home Assistive Devices / Equipment Home Assistive Devices/Equipment: Cane (specify quad or straight) Home Equipment: Walker - 2 wheels, Cane - single point, Psychiatric nursehower seat(lift chair)  Prior Device Use: Indicate devices/aids used by the patient prior to current illness, exacerbation or injury? cane  Prior Functional Level Prior Function Level of Independence: (spouse asisstw with ambulation and adls due to balance issue) Gait / Transfers Assistance Needed: used cane for mobility but requires hands on assist and min assist to transfer at times.  Also walks flexed forward due to chronic back pain.  ADL's / Homemaking Assistance Needed: some assist for bathing and dressing, steadying assist for toileting  Comments: hx of B arm fxs and limited ROM UEs  Self Care: Did the patient need help bathing, dressing, using the toilet or eating?  Needed some help  Indoor Mobility: Did the patient need assistance with walking  from room to room (with or without device)? Needed some help  Stairs: Did the patient need assistance with internal or external stairs (with or without device)? Needed some help  Functional Cognition:  Did the patient need help planning regular tasks such as shopping or remembering to take medications? Needed some help  Current Functional Level Cognition  Overall Cognitive Status: Difficult to assess Difficult to assess due to: Impaired communication Orientation Level: Oriented to person General Comments: pt able to recognize family, able to follow simple commands with fair consistency .    Extremity Assessment (includes Sensation/Coordination)  Upper Extremity Assessment: RUE deficits/detail, LUE deficits/detail RUE Deficits / Details: grossly 3+/5 MMT, limited shoulder flexion to approx 90 due to hx of injuries  LUE Deficits / Details: grossly 3+/5 MMT, limited shoulder flexion to approx 90 due to hx of injuries   Lower Extremity Assessment: Defer to PT evaluation RLE Deficits / Details: motor strength seems to be intact bil, however, motor planning may be difficult, seems to do automatic movement more readily than when asked to bend legs, etc.  pt responding to touch bil legs, but difficult to test sensation.  No significant buckling with standing or side stepping.  RLE Sensation: WNL RLE Coordination: WNL LLE Deficits / Details: motor strength seems to be intact bil, however, motor planning may be difficult, seems to do automatic movement more readily than when asked to bend legs, etc.  pt responding to touch bil legs, but difficult to test sensation.  No significant buckling with standing or side stepping.  LLE Sensation: WNL LLE Coordination: WNL    ADLs  Overall ADL's : Needs assistance/impaired Eating/Feeding: NPO Grooming: Wash/dry hands, Wash/dry face, Maximal assistance, Sitting Grooming Details (indicate cue type and reason): able to wash face but requires assist to  complete hand washing  Upper Body Bathing: Moderate assistance, Sitting Lower Body Bathing: Maximal assistance, +2 for physical assistance, +2 for safety/equipment, Sit to/from stand Upper Body Dressing : Maximal assistance, Sitting Lower Body Dressing: Total assistance, +2 for physical assistance, Sit to/from stand Toilet Transfer Details (indicate cue type and reason): deferred due to pain Toileting- Clothing Manipulation and Hygiene: Total assistance, +2 for physical assistance, Sit to/from stand Toileting - Clothing Manipulation Details (indicate cue type and reason): for hygiene after soiling bed, total assist  Functional mobility during ADLs: Maximal assistance, +2 for physical assistance General ADL Comments: patient limited during session by expressive difficulties, chronic back pain, and genearlized weakness     Mobility  Overal bed mobility: Needs Assistance Bed Mobility: Rolling, Sidelying to Sit, Sit to Supine Rolling: +2 for physical assistance, Min assist Sidelying to sit: Max assist, +2 for physical assistance Sit to supine: Max assist, +2 for physical assistance General bed mobility comments: Pt rolling towards L side with min assist for safety, limited bed mobility by pain and highly resistive to movement.  Patient's spouse assisted as she was less resistive to have him assist but requires heavy max assist +2 to transition from sidelying to sitting.     Transfers  Overall transfer level: Needs assistance Equipment used: 2 person hand held assist Transfers: Sit to/from Stand Sit to Stand: +2 physical assistance, Mod assist General transfer comment: +2 assist for sit to stand and side stepping to [facility]-- see PT noted for details     Ambulation / Gait / Stairs / Wheelchair Mobility  Ambulation/Gait Ambulation/Gait assistance: Mod assist, +2 physical assistance Gait Distance (Feet): 3 Feet Assistive device: 2 person hand held assist Gait Pattern/deviations: Step-to  pattern General Gait Details: side stepped to [facility] for better positioning when returning to supine.      Posture / Balance Dynamic Sitting Balance Sitting balance - Comments: needs min  assist EOB- limited by pain Balance Overall balance assessment: Needs assistance Sitting-balance support: Feet supported, Bilateral upper extremity supported Sitting balance-Leahy Scale: Poor Sitting balance - Comments: needs min assist EOB- limited by pain Standing balance support: Bilateral upper extremity supported Standing balance-Leahy Scale: Poor Standing balance comment: +2 assist required, reliant on external support    Special needs/care consideration BiPAP/CPAP n/a CPM n/a Continuous Drip IV n/a Dialysis n/a Life Vest n/a Oxygen n/a Special Bed n/a Trach Size n/a Wound Vac n/a Skin ecchymosis to right arm, hand and right groin Bowel mgmt: incontinent LBM 12/18 Bladder mgmt: external catheter Diabetic mgmt n/a Poor appetite Chronic loser back pain making it an issue to get up in the morning Chronic balance/fall issues   Previous Home Environment Living Arrangements: Spouse/significant other  Lives With: Spouse Available Help at Discharge: Family, Available 24 hours/day Type of Home: House Home Layout: One level Home Access: Stairs to enter Entrance Stairs-Rails: None Entrance Stairs-Number of Steps: 2 Bathroom Shower/Tub: Engineer, manufacturing systems: Standard Bathroom Accessibility: Yes How Accessible: Accessible via walker Home Care Services: No  Discharge Living Setting Plans for Discharge Living Setting: Patient's home, Lives with (comment)(spouse) Type of Home at Discharge: House Discharge Home Layout: One level Discharge Home Access: Stairs to enter Entrance Stairs-Rails: None Entrance Stairs-Number of Steps: 2 Discharge Bathroom Shower/Tub: Tub/shower unit Discharge Bathroom Toilet: Standard Discharge Bathroom Accessibility: Yes How Accessible: Accessible  via walker Does the patient have any problems obtaining your medications?: No  Social/Family/Support Systems Patient Roles: Spouse, Parent Contact Information: Rocky Link, spouse, married for 27 years Anticipated Caregiver: spouse and daughter, Aram Beecham Anticipated Industrial/product designer Information: see above Ability/Limitations of Caregiver: no limitations Caregiver Availability: 24/7 Discharge Plan Discussed with Primary Caregiver: Yes Is Caregiver In Agreement with Plan?: Yes Does Caregiver/Family have Issues with Lodging/Transportation while Pt is in Rehab?: No   Daughter, Aram Beecham, is Charity fundraiser and very involved with her Mom and Step dad.  Goals/Additional Needs Patient/Family Goal for Rehab: min asisst PT, OT, and SLP Expected length of stay: ELOS 18 to 22 days Pt/Family Agrees to Admission and willing to participate: Yes Program Orientation Provided & Reviewed with Pt/Caregiver Including Roles  & Responsibilities: Yes  Decrease burden of Care through IP rehab admission: n/a  Possible need for SNF placement upon discharge:not anticipated and family prefers direct d/c home with spouse if realistic  Patient Condition: This patient's condition remains as documented in the consult dated 10/05/2018, in which the Rehabilitation Physician determined and documented that the patient's condition is appropriate for intensive rehabilitative care in an inpatient rehabilitation facility. Will admit to inpatient rehab today.  Preadmission Screen Completed By:  Clois Dupes, 10/06/2018 12:13 PM ______________________________________________________________________   Discussed status with Dr. Wynn Banker on 10/06/2018 at  1224 and received telephone approval for admission today.  Admission Coordinator:  Clois Dupes, time 16109 Date 10/06/2018           Cosigned by: Erick Colace, MD at 10/06/2018 12:42 PM  Revision History

## 2018-10-06 NOTE — Discharge Summary (Addendum)
Stroke Discharge Summary  Patient ID: [name]   MRN: [id number]      DOB: 03-24-1930  Date of Admission: 10/03/2018 Date of Discharge: 10/06/2018  Attending Physician:  Rosalin Hawking, MD, Stroke MD Consultant(s):   Kipp Brood, MD ( pulmonary/intensive care ), Delice Lesch, MD (Physical Medicine & Rehabtilitation)  Patient's PCP:  Haywood Pao, MD  Discharge Diagnoses:  Principal Problem:   Stroke [facility]) ischemic embolic L MCA  d/t AF Active Problems:   HLD (hyperlipidemia)   Persistent atrial fibrillation ([place])   Middle cerebral artery embolism, left   Dyslipidemia   Diabetes mellitus type 2 in nonobese [facility])   Atrial fibrillation ([place])   Tachypnea   Stage 3 chronic kidney disease ([place])  Past Medical History:  Diagnosis Date  . Atrial fibrillation ([place])   . Chronic anticoagulation   . Chronic anticoagulation   . CVA (cerebrovascular accident) ([place])   . Diabetes mellitus   . History of chicken pox   . Hyperlipidemia   . Hypertension    Past Surgical History:  Procedure Laterality Date  . APPENDECTOMY    . BACK SURGERY    . CARDIAC CATHETERIZATION  11/09/91   EF 70%  . DILATION AND CURETTAGE OF UTERUS    . IR PERCUTANEOUS ART THROMBECTOMY/INFUSION INTRACRANIAL INC DIAG ANGIO  10/03/2018  . RADIOLOGY WITH ANESTHESIA N/A 10/03/2018   Procedure: IR WITH ANESTHESIA;  Surgeon: Radiologist, Medication, MD;  Location: [place];  Service: Radiology;  Laterality: N/A;    Medications to be continued on Rehab Allergies as of 10/06/2018      Reactions   Cozaar Other (See Comments)   "Almost passed out"   Hydralazine Other (See Comments)   "almost passed out"   Hyzaar [losartan Potassium-hctz] Other (See Comments)   "almost passed out"   Sulfa Drugs Cross Reactors Other (See Comments)   "turned me blue"    Lisinopril    Unknown reaction, per pt      Medication List    STOP taking these medications   warfarin 5 MG tablet Commonly known as:   COUMADIN     TAKE these medications   acetaminophen 325 MG tablet Commonly known as:  TYLENOL Take 2 tablets (650 mg total) by mouth every 4 (four) hours as needed for mild pain (or temp > 37.5 C (99.5 F)). What changed:    medication strength  how much to take  when to take this  reasons to take this   amLODipine 2.5 MG tablet Commonly known as:  NORVASC Take 2.5 mg by mouth daily.   atorvastatin 20 MG tablet Commonly known as:  LIPITOR Take 0.5 tablets (10 mg total) by mouth daily. What changed:  See the new instructions.   diazepam 2 MG tablet Commonly known as:  VALIUM Take 1 tablet (2 mg total) by mouth 2 (two) times daily as needed for anxiety. for anxiety What changed:  reasons to take this   furosemide 20 MG tablet Commonly known as:  LASIX Take 10 mg by mouth as needed for fluid or edema.   levothyroxine 25 MCG tablet Commonly known as:  SYNTHROID, LEVOTHROID Take 1 tablet (25 mcg total) by mouth daily before breakfast. What changed:  See the new instructions.   nadolol 20 MG tablet Commonly known as:  CORGARD Take 0.5 tablets (10 mg total) by mouth daily. What changed:  See the new instructions.   nitroGLYCERIN 0.4 MG SL tablet Commonly known as:  NITROSTAT Place 0.4 mg under the tongue every 5 (five) minutes as needed (chest pain).   oxyCODONE-acetaminophen 5-325 MG tablet Commonly known as:  PERCOCET/ROXICET Take 1 tablet by mouth every 4 (four) hours as needed for severe pain.   potassium chloride 10 MEQ tablet Commonly known as:  K-DUR,KLOR-CON Take 1 tablet (10 mEq total) by mouth daily.       LABORATORY STUDIES CBC    Component Value Date/Time   WBC 8.3 10/06/2018 0508   RBC 2.92 (L) 10/06/2018 0508   HGB 9.5 (L) 10/06/2018 0508   HGB 14.6 05/11/2018 1454   HCT 28.1 (L) 10/06/2018 0508   HCT 44.3 05/11/2018 1454   PLT 266 10/06/2018 0508   PLT 230 05/11/2018 1454   MCV 96.2 10/06/2018 0508   MCV 95 05/11/2018 1454   MCH 32.5  10/06/2018 0508   MCHC 33.8 10/06/2018 0508   RDW 12.1 10/06/2018 0508   RDW 12.1 (L) 05/11/2018 1454   LYMPHSABS 2.1 10/03/2018 1555   LYMPHSABS 1.9 05/11/2018 1454   MONOABS 1.2 (H) 10/03/2018 1555   EOSABS 0.1 10/03/2018 1555   EOSABS 0.1 05/11/2018 1454   BASOSABS 0.0 10/03/2018 1555   BASOSABS 0.0 05/11/2018 1454   CMP    Component Value Date/Time   NA 138 10/06/2018 0508   K 3.3 (L) 10/06/2018 0508   CL 105 10/06/2018 0508   CO2 23 10/06/2018 0508   GLUCOSE 111 (H) 10/06/2018 0508   BUN 12 10/06/2018 0508   CREATININE 1.36 (H) 10/06/2018 0508   CREATININE 0.83 10/13/2017 1658   CALCIUM 7.3 (L) 10/06/2018 0508   PROT 6.4 (L) 10/03/2018 1555   ALBUMIN 3.2 (L) 10/03/2018 1555   AST 22 10/03/2018 1555   ALT 16 10/03/2018 1555   ALKPHOS 52 10/03/2018 1555   BILITOT 0.6 10/03/2018 1555   GFRNONAA 35 (L) 10/06/2018 0508   GFRAA 41 (L) 10/06/2018 0508   COAGS Lab Results  Component Value Date   INR 2.17 10/06/2018   INR 2.99 10/05/2018   INR 3.27 10/04/2018   Lipid Panel    Component Value Date/Time   CHOL 103 10/04/2018 0510   TRIG 88 10/04/2018 0510   HDL 26 (L) 10/04/2018 0510   CHOLHDL 4.0 10/04/2018 0510   VLDL 18 10/04/2018 0510   LDLCALC 59 10/04/2018 0510   LDLCALC 78 10/13/2017 1658   HgbA1C  Lab Results  Component Value Date   HGBA1C 5.2 10/04/2018   Urinalysis    Component Value Date/Time   COLORURINE YELLOW 07/12/2014 1525   APPEARANCEUR CLEAR 07/12/2014 1525   LABSPEC 1.010 07/12/2014 1525   PHURINE 6.5 07/12/2014 1525   GLUCOSEU NEGATIVE 07/12/2014 1525   HGBUR NEGATIVE 07/12/2014 1525   BILIRUBINUR negative 11/23/2017 1618   KETONESUR NEGATIVE 07/12/2014 1525   PROTEINUR trace 11/23/2017 1618   UROBILINOGEN 0.2 11/23/2017 1618   UROBILINOGEN 0.2 07/12/2014 1525   NITRITE negative 11/23/2017 1618   NITRITE NEGATIVE 07/12/2014 1525   LEUKOCYTESUR Moderate (2+) (A) 11/23/2017 1618   Urine Drug Screen No results found for: LABOPIA,  COCAINSCRNUR, LABBENZ, AMPHETMU, THCU, LABBARB  Alcohol Level No results found for: [facility]   SIGNIFICANT DIAGNOSTIC STUDIES Ct Angio Head W Or Wo Contrast  Result Date: 10/03/2018 CLINICAL DATA:  Focal neuro deficit with stroke suspected-deficit is not specified in the history or charted. EXAM: CT ANGIOGRAPHY HEAD AND NECK TECHNIQUE: Multidetector CT imaging of the head and neck was performed using the standard protocol during bolus administration of intravenous contrast. Multiplanar CT  image reconstructions and MIPs were obtained to evaluate the vascular anatomy. Carotid stenosis measurements (when applicable) are obtained utilizing NASCET criteria, using the distal internal carotid diameter as the denominator. CONTRAST:  83m ISOVUE-370 IOPAMIDOL (ISOVUE-370) INJECTION 76% COMPARISON:  Head CT from earlier today. FINDINGS: CTA NECK FINDINGS Aortic arch: The arch and great vessel origins are not covered. Right carotid system: Moderate calcified plaque about the common carotid bifurcation without ulceration or flow limiting stenosis. Left carotid system: Bulky calcified plaque at the ICA bulb with no flow limiting stenosis or ulceration. No ICA beading. Vertebral arteries: The subclavian origins are not covered. There is subclavian atherosclerosis with 50% narrowing on the left. Mild dominance of the right vertebral artery. The vertebral arteries are tortuous without beading or flow limiting stenosis. Skeleton: Usual degenerative changes. No acute or aggressive finding. Other neck: No evident mass or inflammation. Upper chest: No acute finding Review of the MIP images confirms the above findings CTA HEAD FINDINGS Anterior circulation: Left M2 branch occlusion with paucity of downstream vessels. Calcified plaque on the carotid siphons. No proximal flow limiting stenosis. Negative for aneurysm. Posterior circulation: Vertebrobasilar arteries are smooth and widely patent. Hypoplastic left P1 and aplastic right P1  segments. No branch occlusion or flow limiting stenosis. Venous sinuses: Negative Anatomic variants: As above Delayed phase: Not obtained in the emergent setting Review of the MIP images confirms the above findings Critical Value/emergent results were called by telephone at the time of interpretation on 10/03/2018 at 4:25 pm to Dr. DEtta Quill, who verbally acknowledged these results. IMPRESSION: 1. Left M2 branch occlusion. 2. Atherosclerosis but no high-grade stenosis or embolic source identified in the neck. 3. 50% narrowing of the proximal left subclavian. 4. The arch and great vessel ostia were not covered in the field of view. Electronically Signed   By: JMonte FantasiaM.D.   On: 10/03/2018 16:28   Ct Head Wo Contrast  Result Date: 09/22/2018 CLINICAL DATA:  Confusion EXAM: CT HEAD WITHOUT CONTRAST TECHNIQUE: Contiguous axial images were obtained from the base of the skull through the vertex without intravenous contrast. COMPARISON:  03/31/2017 . FINDINGS: Brain: Global atrophy. Prominent chronic ischemic changes in the periventricular white matter. Encephalomalacia in the right cerebellar hemisphere. No mass effect, midline shift, or acute hemorrhage. Vascular: No hyperdense vessel or unexpected calcification. Skull: Cranium is intact. Sinuses/Orbits: Visualized paranasal sinuses and mastoid air cells are clear. Visualized orbits are within normal limits. Other: Noncontributory IMPRESSION: No acute intracranial pathology.  Chronic changes. Electronically Signed   By: AMarybelle KillingsM.D.   On: 09/22/2018 07:47   Ct Angio Neck W Or Wo Contrast  Result Date: 10/03/2018 CLINICAL DATA:  Focal neuro deficit with stroke suspected-deficit is not specified in the history or charted. EXAM: CT ANGIOGRAPHY HEAD AND NECK TECHNIQUE: Multidetector CT imaging of the head and neck was performed using the standard protocol during bolus administration of intravenous contrast. Multiplanar CT image reconstructions and  MIPs were obtained to evaluate the vascular anatomy. Carotid stenosis measurements (when applicable) are obtained utilizing NASCET criteria, using the distal internal carotid diameter as the denominator. CONTRAST:  591mISOVUE-370 IOPAMIDOL (ISOVUE-370) INJECTION 76% COMPARISON:  Head CT from earlier today. FINDINGS: CTA NECK FINDINGS Aortic arch: The arch and great vessel origins are not covered. Right carotid system: Moderate calcified plaque about the common carotid bifurcation without ulceration or flow limiting stenosis. Left carotid system: Bulky calcified plaque at the ICA bulb with no flow limiting stenosis or ulceration. No ICA beading. Vertebral  arteries: The subclavian origins are not covered. There is subclavian atherosclerosis with 50% narrowing on the left. Mild dominance of the right vertebral artery. The vertebral arteries are tortuous without beading or flow limiting stenosis. Skeleton: Usual degenerative changes. No acute or aggressive finding. Other neck: No evident mass or inflammation. Upper chest: No acute finding Review of the MIP images confirms the above findings CTA HEAD FINDINGS Anterior circulation: Left M2 branch occlusion with paucity of downstream vessels. Calcified plaque on the carotid siphons. No proximal flow limiting stenosis. Negative for aneurysm. Posterior circulation: Vertebrobasilar arteries are smooth and widely patent. Hypoplastic left P1 and aplastic right P1 segments. No branch occlusion or flow limiting stenosis. Venous sinuses: Negative Anatomic variants: As above Delayed phase: Not obtained in the emergent setting Review of the MIP images confirms the above findings Critical Value/emergent results were called by telephone at the time of interpretation on 10/03/2018 at 4:25 pm to Dr. Etta Quill , who verbally acknowledged these results. IMPRESSION: 1. Left M2 branch occlusion. 2. Atherosclerosis but no high-grade stenosis or embolic source identified in the neck. 3.  50% narrowing of the proximal left subclavian. 4. The arch and great vessel ostia were not covered in the field of view. Electronically Signed   By: Monte Fantasia M.D.   On: 10/03/2018 16:28   Mr Brain Wo Contrast  Result Date: 10/04/2018 CLINICAL DATA:  Stroke.  Aphasia left-sided weakness EXAM: MRI HEAD WITHOUT CONTRAST TECHNIQUE: Multiplanar, multiecho pulse sequences of the brain and surrounding structures were obtained without intravenous contrast. COMPARISON:  CTA 10/03/2018 FINDINGS: Brain: Acute infarct in the left parietal lobe involving cortex and white matter. Small areas of acute infarct extend into the posterior insula and left frontal white matter. Moderate atrophy. Chronic ischemic changes in the pons bilaterally and in the right cerebellum. Chronic ischemic changes throughout the cerebral white matter bilaterally. Negative for hemorrhage mass or midline shift. Vascular: Negative for hyperdense vessel Skull and upper cervical spine: Negative Sinuses/Orbits: Mild mucosal edema paranasal sinuses. Bilateral cataract surgery Other: None IMPRESSION: Acute infarct left MCA territory. Largest area infarction is in the left parietal lobe with extension into the insula and left frontal lobe. No associated hemorrhage Atrophy and chronic ischemic changes as above. Electronically Signed   By: Franchot Gallo M.D.   On: 10/04/2018 13:48   Dg Chest Port 1 View  Result Date: 10/04/2018 CLINICAL DATA:  Stroke EXAM: PORTABLE CHEST 1 VIEW COMPARISON:  01/09/2015 FINDINGS: Borderline mild cardiomegaly. Aortic atherosclerosis. Bronchovascular crowding versus vascular congestion, no pulmonary edema. Biapical pleuroparenchymal scarring. No consolidation, pleural effusion, or pneumothorax. No acute osseous abnormalities are seen. Kyphoplasty in the thoracic vertebra. IMPRESSION: Borderline mild cardiomegaly. Bronchovascular crowding versus vascular congestion. Electronically Signed   By: Keith Rake M.D.    On: 10/04/2018 03:03   Ir Percutaneous Art Thrombectomy/infusion Intracranial Inc Diag Angio  Result Date: 10/05/2018 INDICATION: Acute onset of aphasia and right-sided weakness. Occluded left middle cerebral artery inferior division distal M2 segment EXAM: 1. EMERGENT LARGE VESSEL OCCLUSION THROMBOLYSIS (anterior CIRCULATION) COMPARISON:  CT angiogram of the head and neck of 10/03/2018. MEDICATIONS: Ancef 2 g IV antibiotic was administered within 1 hour of the procedure. ANESTHESIA/SEDATION: General anesthesia. CONTRAST:  Isovue 300 approximately 100 mL. FLUOROSCOPY TIME:  Fluoroscopy Time: 48 minutes 18 seconds (966.9 mGy). COMPLICATIONS: None immediate. TECHNIQUE: Following a full explanation of the procedure along with the potential associated complications, an informed witnessed consent was obtained from the patient's husband and daughter. The risks of intracranial hemorrhage of  10%, worsening neurological deficit, ventilator dependency, death and inability to revascularize were all reviewed in detail with the patient's family. The patient was then put under general anesthesia by the Department of Anesthesiology at [facility]. The right groin was prepped and draped in the usual sterile fashion. Thereafter using modified Seldinger technique, transfemoral access into the right common femoral artery was obtained without difficulty. Over a 0.035 inch guidewire a 5 French Pinnacle sheath was inserted. Through this, and also over a 0.035 inch guidewire a 5 French Simmons 2 diagnostic catheter was advanced to the aortic arch region and selectively positioned in the left common carotid artery. FINDINGS: The left common carotid arteriogram demonstrates an acute origin of the left common carotid artery in addition to calcification, and also aortic arch tortuosity. The left common carotid bifurcation demonstrates approximately 50-70% stenosis at the origin of the left external carotid artery. Its branches  are normally opacified. The left internal carotid artery just distal to the bulb has approximately 30% stenosis by the NASCET criteria. No evidence of acute ulcerations, or of intraluminal filling defects are seen. More distally, the cervical petrous junctions are widely patent. There is a fusiform dilatation of the caval cavernous segment of the left internal carotid artery. The supraclinoid segment is widely patent. The left posterior communicating artery is seen opacifying the left posterior cerebral artery distribution. The left middle cerebral artery and the left anterior cerebral artery opacify into the capillary and venous phases. There was truncation and occlusion of the mid to distal third of the M2 segment of the inferior division of the left middle cerebral artery. A moderate-sized area of hypoperfusion is noted in the cortical subcortical area of the left parietal region. PROCEDURE: The diagnostic Simmons 2 catheter in the left common carotid artery was then exchanged over a 0.035 inch 300 cm Rosen exchange guidewire for an 8 French 55 cm Brite tip neurovascular sheath using biplane roadmap technique and constant fluoroscopic guidance. Good aspiration obtained from the side port of the neurovascular sheath. This was then connected to continuous heparinized saline infusion. Over the [facility] exchange guidewire, an 85 cm 8 [place] FlowGate balloon guide catheter which had been prepped with 50% contrast and 50% heparinized saline infusion was advanced and positioned just distal to the origin of the left carotid artery. Further advancement was met with significant flow resistance and herniation of the system into the aortic arch because of the tortuosity. Further attempts were then made using an 8 [place] Neuron Max guide sheath. A triaxial system was also utilized using a 4 French 120 cm slip guide catheter over which attempts were made to advance the Neuron Max neurovascular sheath, and also with the 6 [place]  85 cm Cook shuttle sheath and also the 8 French 85 cm FlowGate guide catheter. After multiple attempts to access the more distal anterior circulation, with the tortuosity of the aortic arch, and the acuteness of the origin of the left common carotid artery it was decided to perform a diagnostic arteriogram using a 5 [place] JB 3 catheter in the left common carotid artery. This demonstrated the extracranial and intracranial portions of the left internal carotid artery to be unchanged and widely patent. The left middle cerebral artery now demonstrated that the filling defect in the M2 region of the inferior division of the left middle cerebral artery had moved more distally making access even more difficult. It was, therefore, decided to terminate the procedure. A final control arteriogram performed demonstrated the left external carotid  artery and the internal carotid artery to be unchanged. The intracranial portion of the anterior circulation also demonstrated wide patency of the anterior cerebral artery and the left middle cerebral artery distribution apart from 2 small focal areas of occlusion of the M3 M4 regions of the anterior perisylvian branches, and also the filling defect in the inferior division of the left middle cerebral artery M2 M3 region which had now moved more distally. Throughout the procedure, the patient's blood pressure and neurological status remained stable. The 8 French 55 cm Brite tip neurovascular sheath was then exchanged over a J-tip guidewire for an 8 [place] Pinnacle sheath. This in turn was then removed with the successful hemostasis manually in the right groin puncture site over 30 minutes. The patient's distal pulses in both feet were Dopplerable. Skin on the feet was warm bilaterally. The patient's general anesthesia was then reversed and the patient was then extubated. Upon recovery, the patient was able to open her eyes to command. She could move her left arm and leg spontaneously  and to command. She has a little flicker of movement in the toes of the right foot, otherwise, was unable to move her right arm and leg. She was then transferred to the neuro ICU to continue with further post ischemic stroke management. IMPRESSION: Status post endovascular attempted revascularization of occluded inferior division of the left middle cerebral artery in the mid to distal M2 region as described above. PLAN. Per referring neurologist. Electronically Signed   By: Luanne Bras M.D.   On: 10/04/2018 13:18   Ct Head Code Stroke Wo Contrast  Result Date: 10/03/2018 CLINICAL DATA:  Code stroke. 84 y/o F; Focal neuro deficit, < 6 hrs, stroke suspected. EXAM: CT HEAD WITHOUT CONTRAST TECHNIQUE: Contiguous axial images were obtained from the base of the skull through the vertex without intravenous contrast. COMPARISON:  09/21/2018 CT head FINDINGS: Brain: No evidence of acute infarction, hemorrhage, hydrocephalus, extra-axial collection or mass lesion/mass effect. Stable chronic infarctions within the right lateral cerebellar hemisphere. Stable white matter hypodensities compatible with chronic microvascular ischemic changes and stable volume loss of the brain. Stable small scattered hypodensities within the basal ganglia and left pons, likely represent chronic lacunar infarctions. Vascular: Calcific atherosclerosis of carotid siphons. No hyperdense vessel identified. Skull: Normal. Negative for fracture or focal lesion. Sinuses/Orbits: No acute finding. Other: None. ASPECTS [facility] Stroke Program Early CT Score) - Ganglionic level infarction (caudate, lentiform nuclei, internal capsule, insula, M1-M3 cortex): 7 - Supraganglionic infarction (M4-M6 cortex): 3 Total score (0-10 with 10 being normal): 10 IMPRESSION: 1. No acute intracranial abnormality identified. 2. ASPECTS is 10. 3. Stable chronic microvascular ischemic changes and volume loss of the brain. Stable small chronic infarctions in right  lateral cerebellar hemisphere, left pons, and bilateral basal ganglia. These results were communicated to Dr. Lorraine Lax at [place] 12/16/2019by text page via the [facility] messaging system. Electronically Signed   By: Kristine Garbe M.D.   On: 10/03/2018 16:12    2D Echocardiogram  - Left ventricle: The cavity size was normal. There was mild concentric hypertrophy. Systolic function was normal. The estimated ejection fraction was in the range of 55% to 60%. Akinesis of the basal and mid anteroseptal walls. - Aortic valve: There was mild regurgitation. - Mitral valve: There was mild regurgitation. - Left atrium: The atrium was mildly dilated. - Right ventricle: The cavity size was normal. Wall thickness was normal. Systolic function was normal. - Tricuspid valve: There was mild regurgitation. - Pulmonic valve: There  was no regurgitation. - Pulmonary arteries: Systolic pressure was within the normal range. - Inferior vena cava: The vessel was normal in size. - Pericardium, extracardiac: There was no pericardial effusion.     HISTORY OF PRESENT ILLNESS [name] Laine Fonner is a 84 y.o. female of hypertension, hyperlipidemia, diabetes, CVA, A. Fib on warfarin.  Patient was sitting in a chair next to her husband today when at 1440 he noted she suddenly went flaccid on her right and was not making any sense ([id number] on 10/03/2018).  EMS was called.  On the way to the hospital she apparently became agitated but still was having difficulty expressing herself and understanding.  She was flaccid on the right.  Patient was immediately brought to the CT scanner which showed no subdural or intracranial bleed.  Patient remains flaccid on the right and aphasic.  In addition she is not moving her right side to noxious stimuli. She was not a candidate of tPA as she was on warfarin w/ INR 2.5. Premorbid modified Rankin scale (mRS): 4. NIH stroke scale 15. She was taken for angio where L M2 occlusion was  confirmed; severe tortuosity of vessels precluded treatment. She was admitted for further evaluation and treatment.  HOSPITAL COURSE Ms. Maleeya Peterkin Brinlee Gambrell is a 84 y.o. female with history of Afib on coumadin, stroke 10 years ago without residue, DM, HTN, HLD admitted for aphasia and right side weakness. No tPA given due to therapeutic INR. Unsuccessful IR attempt.   Stroke:  left MCA infarct due to left M2 occlusion s/p IR but unsuccessful, embolic, secondary to Afib even with therapeutic INR  Resultant aphasia and right hemiparesis  MRI left MCA patchy and small infarcts  CT head right cerebellar infarct, old  CTA head and neck - left M2 occlusion, left subclavian 50% stenosis  2D Echo EF 55-60%  DSA - left M2 occlusion, and M3/M4 occlusion with unsuccessful intervention   LDL 59  HgbA1c 5.2  warfarin daily prior to admission, now on No antithrombotic given therapeutic INR. Switch to eliquis 2.22m bid once INR < 2  Therapy recommendations:  CIR  Disposition:  CIR    (lived w/ husband PTA; Dtr CCaren Griffinsis a nurse)  Persistent AF  Followed with Dr. ROval Linseyin cardiology  On coumadin PTA  On nadolol at home  INR 2.52->3.27->2.99->2.17  INR daily  Switch to eliquis 2.582mbid once INR < 2. Discussed with rehab PA who will follow up tomorrow.  Hx of stroke  Stroke 10 years ago with aphasia, received tPA and aphasia resolved. No residue. 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  Diabetes type II  HgbA1c 5.2 goal < 7.0  Controlled  Hypertension  Treated with neo and cleviprex post attempted IR  BP normalized  Resumed home meds of nadolol 10 and amlodipine 2.5  Long term BP goal normotensive  Hyperlipidemia  Home meds: lipitor 10   LDL 59, goal < 70  Resumed lipitor 10  Continue statin at discharge  Other Stroke Risk Factors  Advanced age  Other Active Problems  Elevated Cre  1.60->1.51->1.44->1.36  Leukocytosis, resolved - WBC 10.9->14.2->10.1->8.3 afebrile     DISCHARGE EXAM Blood pressure (!) 114/97, pulse (!) 108, temperature 98.2 F (36.8 C), temperature source Oral, resp. rate 16, height 5' 2"  (1.575 m), weight 52.3 kg, SpO2 95 %. General - Well nourished, well developed, in no apparent distress. Cardiovascular - irregularly irregular heart rate and rhythm with RVR. Neuro -  awake alert, eyes open. Paraphasic errors, partial global aphasia except that she is able to close eyes on voice, but not following other simple commands. PERRL, no gaze preference, tracking to both side, blinking to visual threat on both sides. Slight right facial droop, tongue protrusion not cooperative. BUE limited ROM b/l shoulder but bicep and tricep b/l at least 3/5, symmetrical, hand grip right weaker then left. LLE and RLE proximal and distal 3/5. DTR 1+ and no babinski. Sensation, coordination and gait not tested.    Discharge Diet  Regular thin liquids  DISCHARGE PLAN  Disposition:  Transfer to [facility] for ongoing PT, OT and ST  Start Eliquis (apixaban) daily for secondary stroke prevention once INR < 2.0  Recommend ongoing risk factor control by Primary Care Physician at time of discharge from inpatient rehabilitation.  Follow-up Tisovec, Fransico Him, MD in 2 weeks following discharge from rehab.  Follow-up in [facility] in 4 weeks following discharge from rehab, office to schedule an appointment.   30 minutes were spent preparing discharge.  Burnetta Sabin, MSN, APRN, ANVP-BC, AGPCNP-BC [facility] Stroke Nurse [facility] for Schedule & Pager information 10/06/2018 2:33 PM   ATTENDING NOTE: I reviewed above note and agree with the assessment and plan. Pt was seen and examined.   No event overnight. Neuro stable. Still has expressive aphasia and mild right sided weakness. INR this  am 2.17. will start eliquis once INR<2. Recheck in am. Pt Cre improving from 1.51 down to today 1.36. hemoglobin stable too. PT/OT recommend CIR. Will transfer to CIR today. Pt will follow up with [facility] stroke clinic in 4 weeks.   Rosalin Hawking, MD PhD Stroke Neurology 10/06/2018 10:56 PM

## 2018-10-06 NOTE — Progress Notes (Signed)
Jamse Arn, MD  Physician  Physical Medicine and Rehabilitation  Consult Note  Signed  Date of Service:  10/05/2018 3:03 PM       Related encounter: ED to Hosp-Admission (Current) from 10/03/2018 in [place] [facility]      Signed      Expand All Collapse All    Show:Clear all [x] Manual[x] Template[] Copied  Added by: [x] Angiulli, Lavon Paganini, PA-C[x] Posey Pronto Domenick Bookbinder, MD  [] Hover for details      Physical Medicine and Rehabilitation Consult Reason for Consult:  Decreased functional mobility Referring Physician: DR Erlinda Hong   HPI: [name] is a 84 y.o.right handed female with history of hypertension, hyperlipidemia, diabetes mellitus, CVA on chronic anticoagulation secondary to atrial fibrillation. Per chart review, patient lives with spouse. One level home with 2 steps to entry.patient required a cane for mobility but required some hand on assistance for transfers also. Presented 10/03/2018 with sudden onset of right side weakness and speech difficulty. Cranial CT reviewed, unremarkable for acute intracranial process.  CT angiogram of head and neck showed left M2 branch occlusion. MRI acute infarction left MCA territory. No associated hemorrhage. Interventional radiology attempt set revascularization of M2 occlusion but unsuccessful. Echocardiogram with ejection fraction of 60%. Systolic function was normal. Neurology follow-up with workup ongoing await plan to resume chronic Coumadin versus transition to Eliquis. Tolerating a regular diet. Therapy evaluations completed with recommendations of physical medicine rehabilitation consult.   Review of Systems  Unable to perform ROS: Language       Past Medical History:  Diagnosis Date  . Atrial fibrillation ([place])   . Chronic anticoagulation   . Chronic anticoagulation   . CVA (cerebrovascular accident) ([place])   . Diabetes mellitus   . History of chicken pox   . Hyperlipidemia   .  Hypertension         Past Surgical History:  Procedure Laterality Date  . APPENDECTOMY    . BACK SURGERY    . CARDIAC CATHETERIZATION  11/09/91   EF 70%  . DILATION AND CURETTAGE OF UTERUS    . IR ANGIO INTRA EXTRACRAN SEL COM CAROTID INNOMINATE UNI L MOD SED  10/03/2018  . RADIOLOGY WITH ANESTHESIA N/A 10/03/2018   Procedure: IR WITH ANESTHESIA;  Surgeon: Radiologist, Medication, MD;  Location: [facility];  Service: Radiology;  Laterality: N/A;        Family History  Problem Relation Age of Onset  . Heart disease Mother   . Hypertension Mother   . Stroke Mother   . Heart disease Father   . Heart failure Brother   . Osteoporosis Neg Hx    Social History:  reports that she quit smoking about 19 years ago. She has never used smokeless tobacco. She reports that she does not drink alcohol or use drugs. Allergies:       Allergies  Allergen Reactions  . Cozaar Other (See Comments)    "Almost passed out"  . Hydralazine Other (See Comments)    "almost passed out"  . Hyzaar [Losartan Potassium-Hctz] Other (See Comments)    "almost passed out"  . Sulfa Drugs Cross Reactors Other (See Comments)    "turned me blue"   . Lisinopril     Unknown reaction, per pt         Medications Prior to Admission  Medication Sig Dispense Refill  . acetaminophen (TYLENOL) 500 MG tablet Take 1,000 mg by mouth every 6 (six) hours as needed for mild pain.    Marland Kitchen  amLODipine (NORVASC) 2.5 MG tablet Take 2.5 mg by mouth daily.  1  . atorvastatin (LIPITOR) 20 MG tablet TAKE 1/2 (ONE-HALF) TABLET BY MOUTH ONCE DAILY IN THE MORNING (Patient taking differently: Take 10 mg by mouth daily. ) 30 tablet 6  . diazepam (VALIUM) 2 MG tablet TAKE 1 TABLET BY MOUTH TWICE DAILY AS NEEDED FOR ANXIETY (Patient taking differently: Take 2 mg by mouth 2 (two) times daily as needed for anxiety. ) 90 tablet 0  . furosemide (LASIX) 20 MG tablet Take 10 mg by mouth as needed for fluid or edema.      Marland Kitchen levothyroxine (SYNTHROID, LEVOTHROID) 25 MCG tablet TAKE 1 TABLET BY MOUTH ONCE DAILY BEFORE  BREAKFAST (Patient taking differently: Take 25 mcg by mouth daily before breakfast. ) 90 tablet 0  . nadolol (CORGARD) 20 MG tablet TAKE 1/2 (ONE-HALF) TABLET BY MOUTH ONCE DAILY (Patient taking differently: Take 10 mg by mouth daily. ) 45 tablet 1  . nitroGLYCERIN (NITROSTAT) 0.4 MG SL tablet Place 0.4 mg under the tongue every 5 (five) minutes as needed (chest pain).     Marland Kitchen oxyCODONE-acetaminophen (PERCOCET/ROXICET) 5-325 MG tablet Take 1 tablet by mouth every 4 (four) hours as needed for severe pain.     . potassium chloride (K-DUR,KLOR-CON) 10 MEQ tablet TAKE 1 TABLET BY MOUTH ONCE DAILY (Patient taking differently: Take 10 mEq by mouth daily. ) 90 tablet 1  . warfarin (COUMADIN) 5 MG tablet TAKE 1/2 TO 1 (ONE-HALF TO ONE) TABLET BY MOUTH ONCE DAILY AS DIRECTED BY  COUMADIN  CLINIC (Patient taking differently: Take 2.5-5 mg by mouth every morning. Take 2.29m daily on Sun/Tues/Wed/Thurs/Sat and 537mon Mon & Fri.) 90 tablet 1    Home: [place] to be discharged to:: Private residence Living Arrangements: Spouse/significant other Available Help at Discharge: Family, Available 24 hours/day Type of Home: House Home Access: Stairs to enter [employer] Steps: 2 Entrance Stairs-Rails: None Home Layout: One level Bathroom Shower/Tub: Tub/shower unit(garden tub) Bathroom Toilet: Standard Home Equipment: WaEnvironmental consultant 2 wheels, Cane - single point, ShTourist information centre manager Lives With: Spouse  Functional History: Prior Function Level of Independence: Needs assistance Gait / Transfers Assistance Needed: used cane for mobility but requires hands on assist and min assist to transfer at times.  Also walks flexed forward due to chronic back pain.  ADL's / Homemaking Assistance Needed: some assist for bathing and dressing, steadying assist for toileting  Comments: hx  of B arm fxs and limited ROM UEs Functional Status:  Mobility: Bed Mobility Overal bed mobility: Needs Assistance Bed Mobility: Rolling, Sidelying to Sit, Sit to Supine Rolling: +2 for physical assistance, Min assist Sidelying to sit: Max assist, +2 for physical assistance Sit to supine: Max assist, +2 for physical assistance General bed mobility comments: Pt rolling towards L side with min assist for safety, limited bed mobility by pain and highly resistive to movement.  Patient's spouse assisted as she was less resistive to have him assist but requires heavy max assist +2 to transition from sidelying to sitting.  Transfers Overall transfer level: Needs assistance Equipment used: 2 person hand held assist Transfers: Sit to/from Stand Sit to Stand: +2 physical assistance, Mod assist General transfer comment: +2 assist for sit to stand and [facility] noted for details  Ambulation/Gait Ambulation/Gait assistance: Mod assist, +2 physical assistance Gait Distance (Feet): 3 Feet Assistive device: 2 person hand held assist Gait Pattern/deviations: Step-to pattern General Gait Details:  side stepped to [facility] for better positioning when returning to supine.    ADL: ADL Overall ADL's : Needs assistance/impaired Eating/Feeding: NPO Grooming: Wash/dry hands, Wash/dry face, Maximal assistance, Sitting Grooming Details (indicate cue type and reason): able to wash face but requires assist to complete hand washing  Upper Body Bathing: Moderate assistance, Sitting Lower Body Bathing: Maximal assistance, +2 for physical assistance, +2 for safety/equipment, Sit to/from stand Upper Body Dressing : Maximal assistance, Sitting Lower Body Dressing: Total assistance, +2 for physical assistance, Sit to/from stand Toilet Transfer Details (indicate cue type and reason): deferred due to pain Toileting- Clothing Manipulation and Hygiene: Total assistance, +2 for physical assistance, Sit  to/from stand Toileting - Clothing Manipulation Details (indicate cue type and reason): for hygiene after soiling bed, total assist  Functional mobility during ADLs: Maximal assistance, +2 for physical assistance General ADL Comments: patient limited during session by expressive difficulties, chronic back pain, and genearlized weakness   Cognition: Cognition Overall Cognitive Status: Difficult to assess Orientation Level: Oriented to person, Other (comment)(receptive aphasia ) Cognition Arousal/Alertness: Awake/alert Behavior During Therapy: Flat affect Overall Cognitive Status: Difficult to assess General Comments: pt able to recognize family, able to follow simple commands with fair consistency . Difficult to assess due to: Impaired communication  Blood pressure (!) 147/71, pulse 93, temperature 98.5 F (36.9 C), temperature source Oral, resp. rate (!) 22, height 5' 2"  (1.575 m), weight 52.3 kg, SpO2 100 %. Physical Exam  Vitals reviewed. Constitutional: She appears well-developed and well-nourished.  HENT:  Head: Normocephalic and atraumatic.  Eyes: EOM are normal. Right eye exhibits no discharge. Left eye exhibits no discharge.  Neck: Normal range of motion. Neck supple. No thyromegaly present.  Cardiovascular:  Irregularly irregular  Respiratory: Effort normal and breath sounds normal. No respiratory distress.  GI: Soft. Bowel sounds are normal. She exhibits no distension.  Musculoskeletal:     Comments: No edema or tenderness in extremities  Neurological: She is alert.  Global aphasia.  Motor exam limited by ability to participate, however moving all extremities spontaneously.   She does have some spontaneous speech.  Skin: Skin is warm and dry.  Psychiatric:  Unable to assess due to mentation    LabResultsLast24Hours       Results for orders placed or performed during the hospital encounter of 10/03/18 (from the past 24 hour(s))  CBC     Status: Abnormal    Collection Time: 10/05/18  4:10 AM  Result Value Ref Range   WBC 10.1 4.0 - 10.5 K/uL   RBC 3.04 (L) 3.87 - 5.11 MIL/uL   Hemoglobin 9.7 (L) 12.0 - 15.0 g/dL   HCT 29.8 (L) 36.0 - 46.0 %   MCV 98.0 80.0 - 100.0 fL   MCH 31.9 26.0 - 34.0 pg   MCHC 32.6 30.0 - 36.0 g/dL   RDW 12.2 11.5 - 15.5 %   Platelets 263 150 - 400 K/uL   nRBC 0.0 0.0 - 0.2 %  Basic metabolic panel     Status: Abnormal   Collection Time: 10/05/18  4:10 AM  Result Value Ref Range   Sodium 139 135 - 145 mmol/L   Potassium 3.8 3.5 - 5.1 mmol/L   Chloride 106 98 - 111 mmol/L   CO2 21 (L) 22 - 32 mmol/L   Glucose, Bld 114 (H) 70 - 99 mg/dL   BUN 16 8 - 23 mg/dL   Creatinine, Ser 1.44 (H) 0.44 - 1.00 mg/dL   Calcium 7.3 (L) 8.9 - 10.3  mg/dL   GFR calc non Af Amer 33 (L) >60 mL/min   GFR calc Af Amer 38 (L) >60 mL/min   Anion gap 12 5 - 15  Protime-INR     Status: Abnormal   Collection Time: 10/05/18  4:10 AM  Result Value Ref Range   Prothrombin Time 30.6 (H) 11.4 - 15.2 seconds   INR 2.99       ImagingResults(Last48hours)  Ct Angio Head W Or Wo Contrast  Result Date: 10/03/2018 CLINICAL DATA:  Focal neuro deficit with stroke suspected-deficit is not specified in the history or charted. EXAM: CT ANGIOGRAPHY HEAD AND NECK TECHNIQUE: Multidetector CT imaging of the head and neck was performed using the standard protocol during bolus administration of intravenous contrast. Multiplanar CT image reconstructions and MIPs were obtained to evaluate the vascular anatomy. Carotid stenosis measurements (when applicable) are obtained utilizing NASCET criteria, using the distal internal carotid diameter as the denominator. CONTRAST:  71m ISOVUE-370 IOPAMIDOL (ISOVUE-370) INJECTION 76% COMPARISON:  Head CT from earlier today. FINDINGS: CTA NECK FINDINGS Aortic arch: The arch and great vessel origins are not covered. Right carotid system: Moderate calcified plaque about the common carotid  bifurcation without ulceration or flow limiting stenosis. Left carotid system: Bulky calcified plaque at the ICA bulb with no flow limiting stenosis or ulceration. No ICA beading. Vertebral arteries: The subclavian origins are not covered. There is subclavian atherosclerosis with 50% narrowing on the left. Mild dominance of the right vertebral artery. The vertebral arteries are tortuous without beading or flow limiting stenosis. Skeleton: Usual degenerative changes. No acute or aggressive finding. Other neck: No evident mass or inflammation. Upper chest: No acute finding Review of the MIP images confirms the above findings CTA HEAD FINDINGS Anterior circulation: Left M2 branch occlusion with paucity of downstream vessels. Calcified plaque on the carotid siphons. No proximal flow limiting stenosis. Negative for aneurysm. Posterior circulation: Vertebrobasilar arteries are smooth and widely patent. Hypoplastic left P1 and aplastic right P1 segments. No branch occlusion or flow limiting stenosis. Venous sinuses: Negative Anatomic variants: As above Delayed phase: Not obtained in the emergent setting Review of the MIP images confirms the above findings Critical Value/emergent results were called by telephone at the time of interpretation on 10/03/2018 at 4:25 pm to Dr. DEtta Quill, who verbally acknowledged these results. IMPRESSION: 1. Left M2 branch occlusion. 2. Atherosclerosis but no high-grade stenosis or embolic source identified in the neck. 3. 50% narrowing of the proximal left subclavian. 4. The arch and great vessel ostia were not covered in the field of view. Electronically Signed   By: JMonte FantasiaM.D.   On: 10/03/2018 16:28   Ct Angio Neck W Or Wo Contrast  Result Date: 10/03/2018 CLINICAL DATA:  Focal neuro deficit with stroke suspected-deficit is not specified in the history or charted. EXAM: CT ANGIOGRAPHY HEAD AND NECK TECHNIQUE: Multidetector CT imaging of the head and neck was performed  using the standard protocol during bolus administration of intravenous contrast. Multiplanar CT image reconstructions and MIPs were obtained to evaluate the vascular anatomy. Carotid stenosis measurements (when applicable) are obtained utilizing NASCET criteria, using the distal internal carotid diameter as the denominator. CONTRAST:  560mISOVUE-370 IOPAMIDOL (ISOVUE-370) INJECTION 76% COMPARISON:  Head CT from earlier today. FINDINGS: CTA NECK FINDINGS Aortic arch: The arch and great vessel origins are not covered. Right carotid system: Moderate calcified plaque about the common carotid bifurcation without ulceration or flow limiting stenosis. Left carotid system: Bulky calcified plaque at the ICA bulb  with no flow limiting stenosis or ulceration. No ICA beading. Vertebral arteries: The subclavian origins are not covered. There is subclavian atherosclerosis with 50% narrowing on the left. Mild dominance of the right vertebral artery. The vertebral arteries are tortuous without beading or flow limiting stenosis. Skeleton: Usual degenerative changes. No acute or aggressive finding. Other neck: No evident mass or inflammation. Upper chest: No acute finding Review of the MIP images confirms the above findings CTA HEAD FINDINGS Anterior circulation: Left M2 branch occlusion with paucity of downstream vessels. Calcified plaque on the carotid siphons. No proximal flow limiting stenosis. Negative for aneurysm. Posterior circulation: Vertebrobasilar arteries are smooth and widely patent. Hypoplastic left P1 and aplastic right P1 segments. No branch occlusion or flow limiting stenosis. Venous sinuses: Negative Anatomic variants: As above Delayed phase: Not obtained in the emergent setting Review of the MIP images confirms the above findings Critical Value/emergent results were called by telephone at the time of interpretation on 10/03/2018 at 4:25 pm to Dr. Etta Quill , who verbally acknowledged these results. IMPRESSION:  1. Left M2 branch occlusion. 2. Atherosclerosis but no high-grade stenosis or embolic source identified in the neck. 3. 50% narrowing of the proximal left subclavian. 4. The arch and great vessel ostia were not covered in the field of view. Electronically Signed   By: Monte Fantasia M.D.   On: 10/03/2018 16:28   Mr Brain Wo Contrast  Result Date: 10/04/2018 CLINICAL DATA:  Stroke.  Aphasia left-sided weakness EXAM: MRI HEAD WITHOUT CONTRAST TECHNIQUE: Multiplanar, multiecho pulse sequences of the brain and surrounding structures were obtained without intravenous contrast. COMPARISON:  CTA 10/03/2018 FINDINGS: Brain: Acute infarct in the left parietal lobe involving cortex and white matter. Small areas of acute infarct extend into the posterior insula and left frontal white matter. Moderate atrophy. Chronic ischemic changes in the pons bilaterally and in the right cerebellum. Chronic ischemic changes throughout the cerebral white matter bilaterally. Negative for hemorrhage mass or midline shift. Vascular: Negative for hyperdense vessel Skull and upper cervical spine: Negative Sinuses/Orbits: Mild mucosal edema paranasal sinuses. Bilateral cataract surgery Other: None IMPRESSION: Acute infarct left MCA territory. Largest area infarction is in the left parietal lobe with extension into the insula and left frontal lobe. No associated hemorrhage Atrophy and chronic ischemic changes as above. Electronically Signed   By: Franchot Gallo M.D.   On: 10/04/2018 13:48   Dg Chest Port 1 View  Result Date: 10/04/2018 CLINICAL DATA:  Stroke EXAM: PORTABLE CHEST 1 VIEW COMPARISON:  01/09/2015 FINDINGS: Borderline mild cardiomegaly. Aortic atherosclerosis. Bronchovascular crowding versus vascular congestion, no pulmonary edema. Biapical pleuroparenchymal scarring. No consolidation, pleural effusion, or pneumothorax. No acute osseous abnormalities are seen. Kyphoplasty in the thoracic vertebra. IMPRESSION: Borderline  mild cardiomegaly. Bronchovascular crowding versus vascular congestion. Electronically Signed   By: Keith Rake M.D.   On: 10/04/2018 03:03   Ct Head Code Stroke Wo Contrast  Result Date: 10/03/2018 CLINICAL DATA:  Code stroke. 84 y/o F; Focal neuro deficit, < 6 hrs, stroke suspected. EXAM: CT HEAD WITHOUT CONTRAST TECHNIQUE: Contiguous axial images were obtained from the base of the skull through the vertex without intravenous contrast. COMPARISON:  09/21/2018 CT head FINDINGS: Brain: No evidence of acute infarction, hemorrhage, hydrocephalus, extra-axial collection or mass lesion/mass effect. Stable chronic infarctions within the right lateral cerebellar hemisphere. Stable white matter hypodensities compatible with chronic microvascular ischemic changes and stable volume loss of the brain. Stable small scattered hypodensities within the basal ganglia and left pons, likely represent chronic  lacunar infarctions. Vascular: Calcific atherosclerosis of carotid siphons. No hyperdense vessel identified. Skull: Normal. Negative for fracture or focal lesion. Sinuses/Orbits: No acute finding. Other: None. ASPECTS [facility] Stroke Program Early CT Score) - Ganglionic level infarction (caudate, lentiform nuclei, internal capsule, insula, M1-M3 cortex): 7 - Supraganglionic infarction (M4-M6 cortex): 3 Total score (0-10 with 10 being normal): 10 IMPRESSION: 1. No acute intracranial abnormality identified. 2. ASPECTS is 10. 3. Stable chronic microvascular ischemic changes and volume loss of the brain. Stable small chronic infarctions in right lateral cerebellar hemisphere, left pons, and bilateral basal ganglia. These results were communicated to Dr. Lorraine Lax at [place] 12/16/2019by text page via the [facility] messaging system. Electronically Signed   By: Kristine Garbe M.D.   On: 10/03/2018 16:12   Ir Angio Intra Extracran Sel Com Carotid Innominate Uni L Mod Sed  Result Date: 10/05/2018 INDICATION: Acute  onset of aphasia and right-sided weakness. Occluded left middle cerebral artery inferior division distal M2 segment EXAM: 1. EMERGENT LARGE VESSEL OCCLUSION THROMBOLYSIS (anterior CIRCULATION) COMPARISON:  CT angiogram of the head and neck of 10/03/2018. MEDICATIONS: Ancef 2 g IV antibiotic was administered within 1 hour of the procedure. ANESTHESIA/SEDATION: General anesthesia. CONTRAST:  Isovue 300 approximately 100 mL. FLUOROSCOPY TIME:  Fluoroscopy Time: 48 minutes 18 seconds (966.9 mGy). COMPLICATIONS: None immediate. TECHNIQUE: Following a full explanation of the procedure along with the potential associated complications, an informed witnessed consent was obtained from the patient's husband and daughter. The risks of intracranial hemorrhage of 10%, worsening neurological deficit, ventilator dependency, death and inability to revascularize were all reviewed in detail with the patient's family. The patient was then put under general anesthesia by the Department of Anesthesiology at [facility]. The right groin was prepped and draped in the usual sterile fashion. Thereafter using modified Seldinger technique, transfemoral access into the right common femoral artery was obtained without difficulty. Over a 0.035 inch guidewire a 5 French Pinnacle sheath was inserted. Through this, and also over a 0.035 inch guidewire a 5 French Simmons 2 diagnostic catheter was advanced to the aortic arch region and selectively positioned in the left common carotid artery. FINDINGS: The left common carotid arteriogram demonstrates an acute origin of the left common carotid artery in addition to calcification, and also aortic arch tortuosity. The left common carotid bifurcation demonstrates approximately 50-70% stenosis at the origin of the left external carotid artery. Its branches are normally opacified. The left internal carotid artery just distal to the bulb has approximately 30% stenosis by the NASCET criteria. No  evidence of acute ulcerations, or of intraluminal filling defects are seen. More distally, the cervical petrous junctions are widely patent. There is a fusiform dilatation of the caval cavernous segment of the left internal carotid artery. The supraclinoid segment is widely patent. The left posterior communicating artery is seen opacifying the left posterior cerebral artery distribution. The left middle cerebral artery and the left anterior cerebral artery opacify into the capillary and venous phases. There was truncation and occlusion of the mid to distal third of the M2 segment of the inferior division of the left middle cerebral artery. A moderate-sized area of hypoperfusion is noted in the cortical subcortical area of the left parietal region. PROCEDURE: The diagnostic Simmons 2 catheter in the left common carotid artery was then exchanged over a 0.035 inch 300 cm Rosen exchange guidewire for an 8 French 55 cm Brite tip neurovascular sheath using biplane roadmap technique and constant fluoroscopic guidance. Good aspiration obtained from the side port  of the neurovascular sheath. This was then connected to continuous heparinized saline infusion. Over the [facility] exchange guidewire, an 85 cm 8 [place] FlowGate balloon guide catheter which had been prepped with 50% contrast and 50% heparinized saline infusion was advanced and positioned just distal to the origin of the left carotid artery. Further advancement was met with significant flow resistance and herniation of the system into the aortic arch because of the tortuosity. Further attempts were then made using an 8 [place] Neuron Max guide sheath. A triaxial system was also utilized using a 4 French 120 cm slip guide catheter over which attempts were made to advance the Neuron Max neurovascular sheath, and also with the 6 [place] 85 cm Cook shuttle sheath and also the 8 French 85 cm FlowGate guide catheter. After multiple attempts to access the more distal anterior  circulation, with the tortuosity of the aortic arch, and the acuteness of the origin of the left common carotid artery it was decided to perform a diagnostic arteriogram using a 5 [place] JB 3 catheter in the left common carotid artery. This demonstrated the extracranial and intracranial portions of the left internal carotid artery to be unchanged and widely patent. The left middle cerebral artery now demonstrated that the filling defect in the M2 region of the inferior division of the left middle cerebral artery had moved more distally making access even more difficult. It was, therefore, decided to terminate the procedure. A final control arteriogram performed demonstrated the left external carotid artery and the internal carotid artery to be unchanged. The intracranial portion of the anterior circulation also demonstrated wide patency of the anterior cerebral artery and the left middle cerebral artery distribution apart from 2 small focal areas of occlusion of the M3 M4 regions of the anterior perisylvian branches, and also the filling defect in the inferior division of the left middle cerebral artery M2 M3 region which had now moved more distally. Throughout the procedure, the patient's blood pressure and neurological status remained stable. The 8 French 55 cm Brite tip neurovascular sheath was then exchanged over a J-tip guidewire for an 8 [place] Pinnacle sheath. This in turn was then removed with the successful hemostasis manually in the right groin puncture site over 30 minutes. The patient's distal pulses in both feet were Dopplerable. Skin on the feet was warm bilaterally. The patient's general anesthesia was then reversed and the patient was then extubated. Upon recovery, the patient was able to open her eyes to command. She could move her left arm and leg spontaneously and to command. She has a little flicker of movement in the toes of the right foot, otherwise, was unable to move her right arm and leg.  She was then transferred to the neuro ICU to continue with further post ischemic stroke management. IMPRESSION: Status post endovascular attempted revascularization of occluded inferior division of the left middle cerebral artery in the mid to distal M2 region as described above. PLAN. Per referring neurologist. Electronically Signed   By: Luanne Bras M.D.   On: 10/04/2018 13:18     Assessment/Plan: Diagnosis: left MCA territory infarct Labs and images (see above) independently reviewed.  Records reviewed and summated above. Stroke: Continue secondary stroke prophylaxis and Risk Factor Modification listed below:   Antiplatelet therapy:   Blood Pressure Management:  Continue current medication with prn's with permisive HTN per primary team Statin Agent:   ?Right sided hemiparesis: fit for orthosis to prevent contractures (resting hand splint for day, wrist cock up splint  at night, PRAFO, if necessary) Motor recovery: Fluoxetine  1. Does the need for close, 24 hr/day medical supervision in concert with the patient's rehab needs make it unreasonable for this patient to be served in a less intensive setting? Yes  2. Co-Morbidities requiring supervision/potential complications: HTN (monitor and provide prns in accordance with increased physical exertion and pain), hyperlipidemia (continue meds), DM (Monitor in accordance with exercise and adjust meds as necessary), CVA, atrial fibrillation (continue meds, monitor heart rate with increased physical activity), tachypnea (monitor RR and O2 Sats with increased physical exertion), CKD (avoid nephrotoxic meds) 3. Due to bladder management, safety, skin/wound care, medication administration and patient education, does the patient require 24 hr/day rehab nursing? Yes 4. Does the patient require coordinated care of a physician, rehab nurse, PT (1-2 hrs/day, 5 days/week), OT (1-2 hrs/day, 5 days/week) and SLP (1-2 hrs/day, 5 days/week) to address  physical and functional deficits in the context of the above medical diagnosis(es)? Yes Addressing deficits in the following areas: balance, endurance, locomotion, strength, transferring, bowel/bladder control, bathing, dressing, toileting, cognition, language and psychosocial support 5. Can the patient actively participate in an intensive therapy program of at least 3 hrs of therapy per day at least 5 days per week? Yes 6. The potential for patient to make measurable gains while on inpatient rehab is excellent 7. Anticipated functional outcomes upon discharge from inpatient rehab are min assist  with PT, min assist with OT, min assist with SLP. 8. Estimated rehab length of stay to reach the above functional goals is: 18-22 days. 9. Anticipated D/C setting: Other 10. Anticipated post D/C treatments: SNF. 11. Overall Rehab/Functional Prognosis: good  RECOMMENDATIONS: This patient's condition is appropriate for continued rehabilitative care in the following setting: CIR if caregiver support available upon discharge. Patient has agreed to participate in recommended program. Potentially Note that insurance prior authorization may be required for reimbursement for recommended care.  Comment: Rehab Admissions Coordinator to follow up.   I have personally performed a face to face diagnostic evaluation, including, but not limited to relevant history and physical exam findings, of this patient and developed relevant assessment and plan.  Additionally, I have reviewed and concur with the physician assistant's documentation above.   Delice Lesch, MD, ABPMR Lavon Paganini Angiulli, PA-C 10/05/2018        Revision History                        Routing History

## 2018-10-06 NOTE — Care Management Note (Signed)
Case Management Note  Patient Details  Name: [name] MRN: [id number] Date of Birth: 1929/11/07  Subjective/Objective:    Pt admitted with a stroke. She is from home with spouse.                 Action/Plan: Pt discharging to CIR today. CM signing off.   Expected Discharge Date:  10/06/18               Expected Discharge Plan:  IP Rehab Facility  In-House Referral:     Discharge planning Services  CM Consult  Post Acute Care Choice:    Choice offered to:     DME Arranged:    DME Agency:     HH Arranged:    HH Agency:     Status of Service:  Completed, signed off  If discussed at [employer] Length of [employer] Meetings, dates discussed:    Additional Comments:  Janice BaloKelli F Shawnta Zimbelman, RN 10/06/2018, 12:12 PM

## 2018-10-07 ENCOUNTER — Inpatient Hospital Stay (HOSPITAL_COMMUNITY): Payer: Medicare Other | Admitting: Occupational Therapy

## 2018-10-07 ENCOUNTER — Inpatient Hospital Stay (HOSPITAL_COMMUNITY): Payer: Medicare Other | Admitting: Speech Pathology

## 2018-10-07 ENCOUNTER — Inpatient Hospital Stay (HOSPITAL_COMMUNITY): Payer: Medicare Other

## 2018-10-07 LAB — COMPREHENSIVE METABOLIC PANEL
ALT: 16 U/L (ref 0–44)
AST: 28 U/L (ref 15–41)
Albumin: 3 g/dL — ABNORMAL LOW (ref 3.5–5.0)
Alkaline Phosphatase: 42 U/L (ref 38–126)
Anion gap: 11 (ref 5–15)
BUN: 11 mg/dL (ref 8–23)
CO2: 20 mmol/L — ABNORMAL LOW (ref 22–32)
Calcium: 7.5 mg/dL — ABNORMAL LOW (ref 8.9–10.3)
Chloride: 106 mmol/L (ref 98–111)
Creatinine, Ser: 1.42 mg/dL — ABNORMAL HIGH (ref 0.44–1.00)
GFR calc Af Amer: 39 mL/min — ABNORMAL LOW (ref >60)
GFR calc non Af Amer: 33 mL/min — ABNORMAL LOW (ref >60)
Glucose, Bld: 112 mg/dL — ABNORMAL HIGH (ref 70–99)
Potassium: 3.5 mmol/L (ref 3.5–5.1)
Sodium: 137 mmol/L (ref 135–145)
Total Bilirubin: 1 mg/dL (ref 0.3–1.2)
Total Protein: 5.4 g/dL — ABNORMAL LOW (ref 6.5–8.1)

## 2018-10-07 LAB — CBC WITH DIFFERENTIAL/PLATELET
Abs Immature Granulocytes: 0.1 10*3/uL — ABNORMAL HIGH (ref 0.00–0.07)
Basophils Absolute: 0 10*3/uL (ref 0.0–0.1)
Basophils Relative: 1 %
Eosinophils Absolute: 0.1 10*3/uL (ref 0.0–0.5)
Eosinophils Relative: 2 %
HCT: 29.7 % — ABNORMAL LOW (ref 36.0–46.0)
Hemoglobin: 10 g/dL — ABNORMAL LOW (ref 12.0–15.0)
Immature Granulocytes: 1 %
Lymphocytes Relative: 27 %
Lymphs Abs: 2.2 10*3/uL (ref 0.7–4.0)
MCH: 32.7 pg (ref 26.0–34.0)
MCHC: 33.7 g/dL (ref 30.0–36.0)
MCV: 97.1 fL (ref 80.0–100.0)
Monocytes Absolute: 0.8 10*3/uL (ref 0.1–1.0)
Monocytes Relative: 10 %
Neutro Abs: 4.7 10*3/uL (ref 1.7–7.7)
Neutrophils Relative %: 59 %
Platelets: 273 10*3/uL (ref 150–400)
RBC: 3.06 MIL/uL — ABNORMAL LOW (ref 3.87–5.11)
RDW: 12.3 % (ref 11.5–15.5)
WBC: 7.9 10*3/uL (ref 4.0–10.5)
nRBC: 0 % (ref 0.0–0.2)

## 2018-10-07 LAB — PROTIME-INR
INR: 1.53
Prothrombin Time: 18.2 seconds — ABNORMAL HIGH (ref 11.4–15.2)

## 2018-10-07 MED ORDER — APIXABAN 2.5 MG PO TABS
2.5000 mg | ORAL_TABLET | Freq: Two times a day (BID) | ORAL | Status: DC
  Administered 2018-10-07 – 2018-10-12 (×11): 2.5 mg via ORAL
  Filled 2018-10-07 (×11): qty 1

## 2018-10-07 NOTE — Plan of Care (Signed)
  Problem: RH SAFETY Goal: RH STG ADHERE TO SAFETY PRECAUTIONS W/ASSISTANCE/DEVICE Description STG Adhere to Safety Precautions With Min Assistance/Device.  Outcome: Progressing  Call light within reach, bed/chair alarm, proper footwear, Hearing aid(s) on

## 2018-10-07 NOTE — Progress Notes (Signed)
Heard pt verbally aggressively noted "leave me alone, I'm not getting up." Speech Therapist tried encouraging pt to participate, she continue to refused. ST informed writer she ask pt's family to sit in waiting area to see, if she could get participate which was unsuccessfully.

## 2018-10-07 NOTE — Progress Notes (Signed)
Social Work  Social Work Assessment and Plan  Patient Details  Name: [name] MRN: [id number] Date of Birth: 29-May-1930  Today's Date: 10/07/2018  Problem List:  Patient Active Problem List   Diagnosis Date Noted  . Left middle cerebral artery stroke (HCC) 10/06/2018  . Dyslipidemia   . Diabetes mellitus type 2 in nonobese (HCC)   . Atrial fibrillation (HCC)   . Tachypnea   . Stage 3 chronic kidney disease (HCC)   . Stroke [facility](HCC) ischemic embolic L MCA  d/t AF 10/03/2018  . 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 Frequent falls 10/13/2017  . Vitamin D deficiency 04/22/2017  . Slurring of speech 03/31/2017  . Back pain 09/14/2016  . Compression fracture of thoracic vertebra (HCC) 09/14/2016  . S/P vertebroplasty 09/14/2016  . Chronic diastolic heart failure (HCC) 09/14/2016  . HTN (hypertension) 09/14/2016  . History of CVA (cerebrovascular accident) 09/14/2016  . Hypokalemia 09/11/2016  . Paraspinal hematoma 09/11/2016  . Osteoporosis 05/27/2016  . Hypothyroidism 09/11/2014  . Encounter for therapeutic drug monitoring 11/28/2013  . Persistent atrial fibrillation (HCC) 12/15/2012  . HLD (hyperlipidemia) 03/27/2011   Past Medical History:  Past Medical History:  Diagnosis Date  . Atrial fibrillation (HCC)   . Chronic anticoagulation   . Chronic anticoagulation   . CVA (cerebrovascular accident) (HCC)   . Diabetes mellitus   . History of chicken pox   . Hyperlipidemia   . Hypertension    Past Surgical History:  Past Surgical History:  Procedure Laterality Date  . APPENDECTOMY    . BACK SURGERY    . CARDIAC CATHETERIZATION  11/09/91   EF 70%  . DILATION AND CURETTAGE OF UTERUS    . IR PERCUTANEOUS ART THROMBECTOMY/INFUSION INTRACRANIAL INC DIAG ANGIO  10/03/2018  . RADIOLOGY WITH ANESTHESIA N/A 10/03/2018   Procedure: IR WITH ANESTHESIA;  Surgeon: Radiologist, Medication, MD;  Location: [facility] OR;  Service: Radiology;  Laterality: N/A;    Social History:  reports that she quit smoking about 19 years ago. She has never used smokeless tobacco. She reports that she does not drink alcohol or use drugs.  Family / Support Systems Marital Status: Married How Long?: 27 years Patient Roles: Spouse, Parent Spouse/Significant Other: Iantha FallenKenneth [phone]-home [phone] Children: Aram Beechamynthia White-daughter [phone] Other Supports: Friends Anticipated Caregiver: Husband and daughter Ability/Limitations of Caregiver: no limitations husband was assisting prior to admission Caregiver Availability: 24/7 Family Dynamics: Husband is very involved and will do whatever pt needs. Her daughter is very supportive and a retired Charity fundraiserN. They have friends and church members who visit and will check on them  Social History Preferred language: English Religion: Lutheran Cultural Background: No issues Education: Some college Read: Yes Write: Yes Employment Status: Retired Marine scientistLegal History/Current Legal Issues: No issues Guardian/Conservator: None- according to MD pt is capable of making decisions while here,family will be here daily to provide emotionally support    Abuse/Neglect Abuse/Neglect Assessment Can Be Completed: Yes Physical Abuse: Denies Verbal Abuse: Denies Sexual Abuse: Denies Exploitation of patient/patient's resources: Denies  Emotional Status Pt's affect, behavior and adjustment status: Pt is fast asleep from her two back to back therapies, obtained information from husband and daughter in the room. Pt was independent and only needed assist wiht some bathing and dressing she used a cane or walker at times for stablility. She has no weakness form her past CVA 10 years ago according to daughter. Both want her to gain as much progress as she can here due to it  is important to her to be as independent as possible. Recent Psychosocial Issues: other health issues were managed by her PCP Psychiatric History: No history deferred depression  screen due to still adjusting and will see if will benefit from seeing neuro-psych while here. Needs to be more communicative Substance Abuse History: No issues  Patient / Family Perceptions, Expectations & Goals Pt/Family understanding of illness & functional limitations: Husband and daughter can explain her stroke and deficits, they both ahve spoken with the MD and feel they have a good understanding of her condition. Daughter is a retired Charity fundraiserN. Main issue is if husband wil be able to provide care to pt at DC Premorbid pt/family roles/activities: Wife, mother, grandmother, retiree, friend, church member Anticipated changes in roles/activities/participation: resume Pt/family expectations/goals: Husband states: " I want her to be able to do as much as she can this is important to her, I will help and do whatever she needs."  Daughter states: " I am glad she has this opportunity for rehab and can see how well she can do here."  [employer] IncCommunity Resources Community Agencies: None Premorbid Home Care/DME Agencies: Other (Comment)(has cane, rw, tub seat) Transportation available at discharge: Husband and daughter Resource referrals recommended: Support group (specify)  Discharge Planning Living Arrangements: Spouse/significant other Support Systems: Spouse/significant other, Children, Friends/neighbors, Church/faith community Type of Residence: Private residence Insurance Resources: Harrah's EntertainmentMedicare, Media plannerrivate Insurance (specify)(AARP) Financial Resources: Restaurant manager, fast foodocial Security Financial Screen Referred: No Living Expenses: Own Money Management: Spouse, Patient Does the patient have any problems obtaining your medications?: No Home Management: Both she and husband mostly husband Patient/Family Preliminary Plans: Return home with husband providing primary care and daughter assisting when she can. Both are glad she is on the rehab unit and has the opportunity to get as much reahb as possible before going home. Will await  therapy team evaluations and work on a safe plan for her. Social Work Anticipated Follow Up Needs: HH/OP, Support Group  Clinical Impression Pleasant family who are pleased pt is here on rehab where she will get the most therapies before going home. Pt is strong willed and will work but has her limits also. Will work on a safe plan and await therapy team evaluations. Aware pt will require 24 hr care at discharge.  Lucy Chrisupree, Kayda Allers G 10/07/2018, 11:36 AM

## 2018-10-07 NOTE — IPOC Note (Signed)
Overall Plan of Care [facility](IPOC) Patient Details Name: Janice LargeCarolyn W Martinez Martinez MRN: [id number] DOB: 1930/08/09  Admitting Diagnosis: <principal problem not specified>  Hospital Problems: Active Problems:   Left middle cerebral artery stroke [facility](HCC)     Functional Problem List: Nursing Skin Integrity, Safety, Bladder, Bowel, Endurance, Motor, Nutrition, Medication Management  PT Balance, Behavior, Edema, Endurance, Motor, Nutrition, Pain, Perception, Safety, Sensory, Skin Integrity  OT Balance, Behavior, Cognition, Endurance, Motor, Pain, Safety  SLP Behavior, Perception  TR         Basic ADL's: OT Grooming, Bathing, Dressing, Toileting     Advanced  ADL's: OT       Transfers: PT Bed Mobility, Bed to Chair, Car, State Street CorporationFurniture, Floor  OT Toilet, Research scientist (life sciences)Tub/Shower     Locomotion: PT Ambulation, Psychologist, prison and probation servicesWheelchair Mobility, Stairs     Additional Impairments: OT None  SLP Communication, Social Cognition expression Awareness, Problem Solving, Attention  TR      Anticipated Outcomes Item Anticipated Outcome  Self Feeding supervision/setup  Swallowing      Basic self-care  Min assist  Toileting  Min assist   Bathroom Transfers Min assist  Bowel/Bladder  Pt will manage bowel and bladder with with mod assist while in rehab   Transfers  CGA  Locomotion  CGA  Communication  Min A   Cognition  Min A   Pain  Pt will manage pain at 2 or less on a scale of 0-10.   Safety/Judgment  Pt will remain free of falls with injury with min assist while in rehab.    Therapy Plan: PT Intensity: Minimum of 1-2 x/day ,45 to 90 minutes PT Frequency: 5 out of 7 days PT Duration Estimated Length of Stay: 12-14 days OT Intensity: Minimum of 1-2 x/day, 45 to 90 minutes OT Frequency: Total of 15 hours over 7 days of combined therapies OT Duration/Estimated Length of Stay: 7-10 days SLP Intensity: Minumum of 1-2 x/day, 30 to 90 minutes SLP Frequency: 3 to 5 out of 7 days SLP Duration/Estimated Length of  Stay: 12 to 14 days (changed to 15 over 7 d/t behaviors)    Team Interventions: Nursing Interventions Patient/Family Education, Bladder Management, Bowel Management, Disease Management/Prevention, Medication Management, Dysphagia/Aspiration Precaution Training, Cognitive Remediation/Compensation, Skin Care/Wound Management  PT interventions Ambulation/gait training, Balance/vestibular training, Cognitive remediation/compensation, Discharge planning, Functional mobility training, Psychosocial support, Therapeutic Activities, Visual/perceptual remediation/compensation, Disease management/prevention, Neuromuscular re-education, Skin care/wound management, Therapeutic Exercise, Wheelchair propulsion/positioning, DME/adaptive equipment instruction, Pain management, Splinting/orthotics, UE/LE Strength taining/ROM, FirefighterCommunity reintegration, Equities traderatient/family education, Museum/gallery curatortair training, UE/LE Coordination activities  OT Interventions Warden/rangerBalance/vestibular training, Cognitive remediation/compensation, Discharge planning, Disease mangement/prevention, DME/adaptive equipment instruction, Functional mobility training, Neuromuscular re-education, Pain management, Patient/family education, Psychosocial support, Self Care/advanced ADL retraining, Therapeutic Activities, Therapeutic Exercise, UE/LE Strength taining/ROM, UE/LE Coordination activities  SLP Interventions Patient/family education, Speech/Language facilitation  TR Interventions    SW/CM Interventions Discharge Planning, Psychosocial Support, Patient/Family Education   Barriers to Discharge MD  Medical stability  Nursing      PT Inaccessible home environment, Behavior 2 STE without rails  OT      SLP Other (comments) progress/prognosis will be limited by pt's baseline manipulative/controlling and arguementive behaviors (per family report)  SW       Team Discharge Planning: Destination: PT-Home ,OT- Home , SLP-Home Projected Follow-up: PT-Home health PT,  OT-  Home health OT, SLP-None Projected Equipment Needs: PT-To be determined, OT- Tub/shower bench, SLP-None recommended by SLP Equipment Details: PT- , OT-  Patient/family involved in discharge planning: PT- Patient, Family member/caregiver,  OT-Family member/caregiver, SLP-Patient, Family member/caregiver  MD ELOS: 18-24d Medical Rehab Prognosis:  Good Assessment:  female with history of hypertension, hyperlipidemia,diet controlleddiabetes mellitus, CVA 10 years ago on chronic anticoagulation secondary to atrial fibrillation followed by Dr. Duke Salviaandolph cardiology services,chronic back pain on prn percocet.. Per chart review, patient lives with spouse. One level home with 2 steps to entry. Patient required a cane for mobility but required some hand on assistance for transfers also. Presented 10/03/2018 with sudden onset of right-sided weakness and speech difficulty. INR on admission of 2.52.Cranial CT scan reviewed, unremarkable for acute intracranial process. CT angiogram of head and neck showed left M2 branch occlusion. MRI acute infarction left MCA territory. No associated hemorrhage. Interventional radiology attempt revascularization of M2 occlusion but unsuccessful. Echocardiogram with ejection fraction of 60%. Systolic function was normal. Neurology follow-upplan was for INR to be less than 2.00and then begin ELIQUIS in place of Coumadin.Tolerating a regular diet. Therapy evaluations completed with recommendations of physical medicine rehabilitation consult   Now requiring 24/7 Rehab RN,MD, as well as CIR level PT, OT and SLP.  Treatment team will focus on ADLs and mobility with [facility] See Team Conference Notes for weekly updates to the plan of care

## 2018-10-07 NOTE — Evaluation (Signed)
Physical Therapy Assessment and Plan  Patient Details  Name: [name] MRN: [id number] Date of Birth: [birth year]  PT Diagnosis: Abnormal posture, Difficulty walking, Hemiparesis dominant, Impaired cognition and Low back pain Rehab Potential: Good ELOS: 12-14 days   Today's Date: 10/07/2018 PT Individual Time: 0730-0840 PT Individual Time Calculation (min): 70 min    Problem List:  Patient Active Problem List   Diagnosis Date Noted  . 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 Dyslipidemia   . Diabetes mellitus type 2 in nonobese (HCC)   . Atrial fibrillation ([place])   . Tachypnea   . Stage 3 chronic kidney disease ([place])   . Stroke [facility] ischemic embolic L MCA  d/t AF 62/56/3893  . 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 Frequent falls 10/13/2017  . Vitamin D deficiency 04/22/2017  . Slurring of speech 03/31/2017  . Back pain 09/14/2016  . Compression fracture of thoracic vertebra (HCC) 09/14/2016  . S/P vertebroplasty 09/14/2016  . Chronic diastolic heart failure ([name]) 09/14/2016  . HTN (hypertension) 09/14/2016  . History of CVA (cerebrovascular accident) 09/14/2016  . Hypokalemia 09/11/2016  . Paraspinal hematoma 09/11/2016  . Osteoporosis 05/27/2016  . Hypothyroidism 09/11/2014  . Encounter for therapeutic drug monitoring 11/28/2013  . Persistent atrial fibrillation ([name]) 12/15/2012  . HLD (hyperlipidemia) 03/27/2011    Past Medical History:  Past Medical History:  Diagnosis Date  . Atrial fibrillation ([name])   . Chronic anticoagulation   . Chronic anticoagulation   . CVA (cerebrovascular accident) ([name])   . Diabetes mellitus   . History of chicken pox   . Hyperlipidemia   . Hypertension    Past Surgical History:  Past Surgical History:  Procedure Laterality Date  . APPENDECTOMY    . BACK SURGERY    . CARDIAC CATHETERIZATION  11/09/91   EF 70%  . DILATION AND CURETTAGE OF UTERUS    . IR PERCUTANEOUS ART  THROMBECTOMY/INFUSION INTRACRANIAL INC DIAG ANGIO  10/03/2018  . RADIOLOGY WITH ANESTHESIA N/A 10/03/2018   Procedure: IR WITH ANESTHESIA;  Surgeon: Radiologist, Medication, MD;  Location: [place];  Service: Radiology;  Laterality: N/A;    Assessment & Plan Clinical Impression: Patient is a 84 y.o. year old female with history of hypertension, hyperlipidemia,diet controlleddiabetes mellitus, CVA 10 years ago on chronic anticoagulation secondary to atrial fibrillation followed by Dr. Oval Linsey cardiology services,chronic back pain on prn percocet.. Per chart review, patient lives with spouse. One level home with 2 steps to entry. Patient required a cane for mobility but required some hand on assistance for transfers also. Presented 10/03/2018 with sudden onset of right-sided weakness and speech difficulty. INR on admission of 2.52.Cranial CT scan reviewed, unremarkable for acute intracranial process. CT angiogram of head and neck showed left M2 branch occlusion. MRI acute infarction left MCA territory. No associated hemorrhage. Interventional radiology attempt revascularization of M2 occlusion but unsuccessful. Echocardiogram with ejection fraction of 60%. Systolic function was normal. Neurology follow-[place]lan was for INR to be less than 2.00and then begin ELIQUIS in place of Coumadin.Tolerating a regular diet. Therapy evaluations completed with recommendations of physical medicine rehabilitation consult. Patient was admitted for a comprehensive rehabilitation program.  Patient transferred to CIR on 10/06/2018 .   Patient currently requires mod with mobility secondary to muscle weakness, decreased coordination, decreased attention and decreased awareness and decreased standing balance, decreased postural control, hemiplegia and decreased balance strategies.  Prior to hospitalization, patient was min with mobility and lived with Spouse in a House home.  Home access is 2Stairs to enter.  Patient will  benefit from skilled PT intervention to maximize safe functional mobility, minimize fall risk and decrease caregiver burden for planned discharge home with 24 hour assist.  Anticipate patient will benefit from follow up [facility] at discharge.  PT - End of Session Activity Tolerance: Tolerates 30+ min activity with multiple rests Endurance Deficit: Yes PT Assessment Rehab Potential (ACUTE/IP ONLY): Good PT Barriers to Discharge: Inaccessible home environment;Behavior PT Barriers to Discharge Comments: 2 STE without rails PT Patient demonstrates impairments in the following area(s): Balance;Behavior;Edema;Endurance;Motor;Nutrition;Pain;Perception;Safety;Sensory;Skin Integrity PT Transfers Functional Problem(s): Bed Mobility;Bed to Chair;Car;Furniture;Floor PT Locomotion Functional Problem(s): Ambulation;Wheelchair Mobility;Stairs PT Plan PT Intensity: Minimum of 1-2 x/day ,45 to 90 minutes PT Frequency: 5 out of 7 days PT Duration Estimated Length of Stay: 12-14 days PT Treatment/Interventions: Ambulation/gait training;Balance/vestibular training;Cognitive remediation/compensation;Discharge planning;Functional mobility training;Psychosocial support;Therapeutic Activities;Visual/perceptual remediation/compensation;Disease management/prevention;Neuromuscular re-education;Skin care/wound management;Therapeutic Exercise;Wheelchair propulsion/positioning;DME/adaptive equipment instruction;Pain management;Splinting/orthotics;UE/LE Strength taining/ROM;Community reintegration;Patient/family education;Stair training;UE/LE Coordination activities PT Transfers Anticipated Outcome(s): CGA PT Locomotion Anticipated Outcome(s): CGA PT Recommendation Follow Up Recommendations: Home health PT Patient destination: Home Equipment Recommended: To be determined  Skilled Therapeutic Intervention Evaluation completed (see details above and below) with education on PT POC and goals and individual treatment initiated with  focus on functional mobility, activity tolerance, balance and safety. Pt supine in bed upon PT arrival, agreeable to therapy tx and denies pain at rest. Pt requiring max encouragement for participation this session, daughter reports that she is not a morning person and was sedentary prior. Pt donned pants while supine, performed bridge to pull over hips, min assist. Pt transferred to sitting EOB with mod assist and donned bra/shirt with min assist. Pt performed squat pivot to w/c with mod assist and transported to the gym. Pt ambulated x40 ft this session with RW and min assist, verbal cues for safety. Pt transported to ortho gym and perform car transfer stand pivot with mod assist, verbal cues for techniques. Pt transported to gym and ascended/descended 1 step with B handrails min assist, step to pattern. Attempted w/c propulsion, pt shakes her head no. Therapist discussed either adding rails to home steps or considering a ramp for increased safety, daughter thought those could be a possibility. Pt transported back to room and performed stand pivot to recliner mod assist, left seated in recliner with daughter present and needs in reach.    PT Evaluation Precautions/Restrictions Precautions Precautions: Fall;Other (comment) Restrictions Weight Bearing Restrictions: No Pain   denies pain at rest Home Living/Prior [place] Available Help at Discharge: Family;Available 24 hours/day Type of Home: House Home Access: Stairs to enter [employer] of Steps: 2 Entrance Stairs-Rails: None Home Layout: One level  Lives With: Spouse Prior Function Level of Independence: Needs assistance with ADLs;Needs assistance with gait  Able to Take Stairs?: Yes Driving: No Vocation: Retired Comments: hx of osteoporosis with vertebral fxs limiting function. hx of hip fx. Pt already has [place] and RW that she was using prior Cognition Overall Cognitive Status: Impaired/Different from  baseline Orientation Level: Oriented to person Attention: Sustained Sustained Attention: Impaired Awareness: Impaired Sensation Sensation Light Touch: Appears Intact Proprioception: Appears Intact Additional Comments: sensation appears intact, unable to formally assess secondary to aphasia Coordination Gross Motor Movements are Fluid and Coordinated: No Fine Motor Movements are Fluid and Coordinated: No Motor  Motor Motor: Hemiplegia Motor - Skilled Clinical Observations: mild R hemiparesis  Mobility Bed Mobility Bed Mobility: Rolling Right;Rolling Left;Supine to Sit;Sit to Supine Rolling Right: Minimal Assistance -  Patient > 75% Rolling Left: Minimal Assistance - Patient > 75% Supine to Sit: Moderate Assistance - Patient 50-74% Sit to Supine: Moderate Assistance - Patient 50-74% Transfers Transfers: Sit to Stand;Stand to Sit;Squat Pivot Transfers;Stand Pivot Transfers Sit to Stand: Minimal Assistance - Patient > 75% Stand to Sit: Minimal Assistance - Patient > 75% Stand Pivot Transfers: Minimal Assistance - Patient > 75% Stand Pivot Transfer Details: Verbal cues for technique;Verbal cues for precautions/safety Squat Pivot Transfers: Moderate Assistance - Patient 50-74% Locomotion  Gait Ambulation: Yes Gait Assistance: Minimal Assistance - Patient > 75% Gait Distance (Feet): 40 Feet Assistive device: Rolling walker Gait Assistance Details: Verbal cues for technique;Verbal cues for safe use of DME/AE;Verbal cues for precautions/safety Gait Gait: Yes Gait Pattern: Step-to pattern;Trunk flexed;Narrow base of support Stairs / Additional Locomotion Stairs: Yes Stairs Assistance: Minimal Assistance - Patient > 75% Stair Management Technique: Two rails Number of Stairs: 1 Height of Stairs: 6  Trunk/Postural Assessment  Cervical Assessment Cervical Assessment: Exceptions to WFL(forward head posture) Thoracic Assessment Thoracic Assessment: Exceptions to WFL(R lateral  flexion with kyphosis) Lumbar Assessment Lumbar Assessment: Exceptions to WFL(posterior pelvic tilt, pelvic obliquity ) Postural Control Postural Control: Deficits on evaluation Protective Responses: impaired  Balance Balance Balance Assessed: Yes Static Sitting Balance Static Sitting - Level of Assistance: 5: Stand by assistance Dynamic Sitting Balance Dynamic Sitting - Level of Assistance: 5: Stand by assistance Static Standing Balance Static Standing - Level of Assistance: 4: Min assist Dynamic Standing Balance Dynamic Standing - Level of Assistance: 3: Mod assist Extremity Assessment  RLE Assessment RLE Assessment: Exceptions to [facility] General Strength Comments: grossly 3-/5 throughout LLE Assessment LLE Assessment: Exceptions to [facility] General Strength Comments: grossly 4/5 throughout    Refer to Care Plan for Long Term Goals  Recommendations for other services: None   Discharge Criteria: Patient will be discharged from PT if patient refuses treatment 3 consecutive times without medical reason, if treatment goals not met, if there is a change in medical status, if patient makes no progress towards goals or if patient is discharged from hospital.  The above assessment, treatment plan, treatment alternatives and goals were discussed and mutually agreed upon: by patient  Netta Corrigan, PT, DPT 10/07/2018, 8:32 AM

## 2018-10-07 NOTE — Discharge Instructions (Addendum)
Inpatient Rehab Discharge Instructions  [name] Discharge date and time: No discharge date for patient encounter.   Activities/Precautions/ Functional Status: Activity: activity as tolerated Diet: regular diet Wound Care: none needed Functional status:  ___ No restrictions     ___ Walk up steps independently ___ 24/7 supervision/assistance   ___ Walk up steps with assistance ___ Intermittent supervision/assistance  ___ Bathe/dress independently ___ Walk with walker     _x STROKE/TIA DISCHARGE INSTRUCTIONS SMOKING Cigarette smoking nearly doubles your risk of having a stroke & is the single most alterable risk factor  If you smoke or have smoked in the last 12 months, you are advised to quit smoking for your health.  Most of the excess cardiovascular risk related to smoking disappears within a year of stopping.  Ask you doctor about anti-smoking medications  [place] Quit Line: 1-800-QUIT NOW  Free Smoking Cessation Classes [phone]  CHOLESTEROL Know your levels; limit fat & cholesterol in your diet  Lipid Panel     Component Value Date/Time   CHOL 103 10/04/2018 0510   TRIG 88 10/04/2018 0510   HDL 26 (L) 10/04/2018 0510   CHOLHDL 4.0 10/04/2018 0510   VLDL 18 10/04/2018 0510   LDLCALC 59 10/04/2018 0510   LDLCALC 78 10/13/2017 1658      Many patients benefit from treatment even if their cholesterol is at goal.  Goal: Total Cholesterol (CHOL) less than 160  Goal:  Triglycerides (TRIG) less than 150  Goal:  HDL greater than 40  Goal:  LDL (LDLCALC) less than 100   BLOOD PRESSURE American Stroke Association blood pressure target is less that 120/80 mm/Hg  Your discharge blood pressure is:  BP: (!) 136/105  Monitor your blood pressure  Limit your salt and alcohol intake  Many individuals will require more than one medication for high blood pressure  DIABETES (A1c is a blood sugar average for last 3 months) Goal HGBA1c is under 7% (HBGA1c is blood  sugar average for last 3 months)  Diabetes: No known diagnosis of diabetes    Lab Results  Component Value Date   HGBA1C 5.2 10/04/2018     Your HGBA1c can be lowered with medications, healthy diet, and exercise.  Check your blood sugar as directed by your physician  Call your physician if you experience unexplained or low blood sugars.  PHYSICAL ACTIVITY/REHABILITATION Goal is 30 minutes at least 4 days per week  Activity: Increase activity slowly, Therapies: Physical Therapy: Home Health Return to work:   Activity decreases your risk of heart attack and stroke and makes your heart stronger.  It helps control your weight and blood pressure; helps you relax and can improve your mood.  Participate in a regular exercise program.  Talk with your doctor about the best form of exercise for you (dancing, walking, swimming, cycling).  DIET/WEIGHT Goal is to maintain a healthy weight  Your discharge diet is:  Diet Order            Diet regular Room service appropriate? Yes; Fluid consistency: Thin  Diet effective now              liquids Your height is:  Height: 5\' 7"  (170.2 cm) Your current weight is: Weight: 54.1 kg Your Body Mass Index (BMI) is:  BMI (Calculated): 18.68  Following the type of diet specifically designed for you will help prevent another stroke.  Your goal weight range is:    Your goal Body Mass Index (BMI) is 19-24.  Healthy food habits can help reduce 3 risk factors for stroke:  High cholesterol, hypertension, and excess weight.  RESOURCES Stroke/Support Group:  Call [phone]   STROKE EDUCATION PROVIDED/REVIEWED AND GIVEN TO PATIENT Stroke warning signs and symptoms How to activate emergency medical system (call 911). Medications prescribed at discharge. Need for follow-up after discharge. Personal risk factors for stroke. Pneumonia vaccine given:  Flu vaccine given:  My questions have been answered, the writing is legible, and I understand these  instructions.  I will adhere to these goals & educational materials that have been provided to me after my discharge from the hospital.   __ Bathe/dress with assistance ___ Walk Independently    ___ Shower independently ___ Walk with assistance    ___ Shower with assistance ___ No alcohol     ___ Return to work/school ________  Special Instructions:    COMMUNITY REFERRALS UPON DISCHARGE:    Outpatient: PT, OT, SP  Agency:[facility] Phone:[phone]   Date of Last Service:10/11/2018  Appointment Date/Time:WILL CONTACT HUSBAND TO ARRANGE FOLLOW UP APPOINTMENTS  Medical Equipment/Items Ordered:YOUT ROLLING WALKER & 3 IN 1  Agency/Supplier:[facility]   [phone]   GENERAL COMMUNITY RESOURCES FOR PATIENT/FAMILY: Support Groups:[facility] SUPPORT GROUP THE SECOND Thursday @ 6:00-7:00 PM ON THE REHAB UNIT QUESTIONS CONTACT AMY [phone]  My questions have been answered and I understand these instructions. I will adhere to these goals and the provided educational materials after my discharge from the hospital.  Patient/Caregiver Signature _______________________________ Date __________  Clinician Signature _______________________________________ Date __________  Please bring this form and your medication list with you to all your follow-up doctor's appointments.     Information on my medicine - ELIQUIS (apixaban)  Why was Eliquis prescribed for you? Eliquis was prescribed for you to reduce the risk of forming blood clots that can cause a stroke if you have a medical condition called atrial fibrillation (a type of irregular heartbeat) OR to reduce the risk of a blood clots forming after orthopedic surgery.  What do You need to know about Eliquis ? Take your Eliquis TWICE DAILY - one tablet in the morning and one tablet in the evening with or without food.  It would be best to take the doses about the same time each day.  If you have difficulty  swallowing the tablet whole please discuss with your pharmacist how to take the medication safely.  Take Eliquis exactly as prescribed by your doctor and DO NOT stop taking Eliquis without talking to the doctor who prescribed the medication.  Stopping may increase your risk of developing a new clot or stroke.  Refill your prescription before you run out.  After discharge, you should have regular check-up appointments with your healthcare provider that is prescribing your Eliquis.  In the future your dose may need to be changed if your kidney function or weight changes by a significant amount or as you get older.  What do you do if you miss a dose? If you miss a dose, take it as soon as you remember on the same day and resume taking twice daily.  Do not take more than one dose of ELIQUIS at the same time.  Important Safety Information A possible side effect of Eliquis is bleeding. You should call your healthcare provider right away if you experience any of the following: ? Bleeding from an injury or your nose that does not stop. ? Unusual colored urine (red or dark brown) or unusual colored stools (red or  black). ? Unusual bruising for unknown reasons. ? A serious fall or if you hit your head (even if there is no bleeding).  Some medicines may interact with Eliquis and might increase your risk of bleeding or clotting while on Eliquis. To help avoid this, consult your healthcare provider or pharmacist prior to using any new prescription or non-prescription medications, including herbals, vitamins, non-steroidal anti-inflammatory drugs (NSAIDs) and supplements.  This website has more information on Eliquis (apixaban): [email or web address].

## 2018-10-07 NOTE — Progress Notes (Signed)
Inpatient Rehabilitation  Patient information reviewed and entered into eRehab system by Jaydeen Odor M. Carry Weesner, M.A., CCC/SLP, PPS Coordinator.  Information including medical coding, functional ability and quality indicators will be reviewed and updated through discharge.    Per nursing patient was given "Data Collection Information Summary" for Patients in Inpatient Rehabilitation Facilities with attached "Privacy Act Statement-Health Care Records" upon admission.   

## 2018-10-07 NOTE — Evaluation (Signed)
Occupational Therapy Assessment and Plan  Patient Details  Name: [name] MRN: [id number] Date of Birth: 05/21/1930  OT Diagnosis: abnormal posture, cognitive deficits, hemiplegia affecting dominant side and muscle weakness (generalized) Rehab Potential: Rehab Potential (ACUTE ONLY): Fair ELOS: 7-10 days   Today's Date: 10/07/2018 OT Individual Time: 9622-2979 OT Individual Time Calculation (min): 53 min     Problem List:  Patient Active Problem List   Diagnosis Date Noted  . Left middle cerebral artery stroke ([place]) 10/06/2018  . Dyslipidemia   . Diabetes mellitus type 2 in nonobese (HCC)   . Atrial fibrillation ([place])   . Tachypnea   . Stage 3 chronic kidney disease ([place])   . Stroke [facility]) ischemic embolic L MCA  d/t AF 89/21/1941  . 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 Frequent falls 10/13/2017  . Vitamin D deficiency 04/22/2017  . Slurring of speech 03/31/2017  . Back pain 09/14/2016  . Compression fracture of thoracic vertebra (HCC) 09/14/2016  . S/P vertebroplasty 09/14/2016  . Chronic diastolic heart failure ([place]) 09/14/2016  . HTN (hypertension) 09/14/2016  . History of CVA (cerebrovascular accident) 09/14/2016  . Hypokalemia 09/11/2016  . Paraspinal hematoma 09/11/2016  . Osteoporosis 05/27/2016  . Hypothyroidism 09/11/2014  . Encounter for therapeutic drug monitoring 11/28/2013  . Persistent atrial fibrillation ([place]) 12/15/2012  . HLD (hyperlipidemia) 03/27/2011    Past Medical History:  Past Medical History:  Diagnosis Date  . Atrial fibrillation ([place])   . Chronic anticoagulation   . Chronic anticoagulation   . CVA (cerebrovascular accident) ([place])   . Diabetes mellitus   . History of chicken pox   . Hyperlipidemia   . Hypertension    Past Surgical History:  Past Surgical History:  Procedure Laterality Date  . APPENDECTOMY    . BACK SURGERY    . CARDIAC CATHETERIZATION  11/09/91   EF 70%  . DILATION AND CURETTAGE OF  UTERUS    . IR PERCUTANEOUS ART THROMBECTOMY/INFUSION INTRACRANIAL INC DIAG ANGIO  10/03/2018  . RADIOLOGY WITH ANESTHESIA N/A 10/03/2018   Procedure: IR WITH ANESTHESIA;  Surgeon: Radiologist, Medication, MD;  Location: [place];  Service: Radiology;  Laterality: N/A;    Assessment & Plan Clinical Impression: Patient is a 84 y.o. right handed female with history of hypertension, hyperlipidemia,diet controlleddiabetes mellitus, CVA 10 years ago on chronic anticoagulation secondary to atrial fibrillation followed by Dr. Oval Linsey cardiology services,chronic back pain on prn percocet.. Per chart review, patient lives with spouse. One level home with 2 steps to entry. Patient required a cane for mobility but required some hand on assistance for transfers also. Presented 10/03/2018 with sudden onset of right-sided weakness and speech difficulty. INR on admission of 2.52.Cranial CT scan reviewed, unremarkable for acute intracranial process. CT angiogram of head and neck showed left M2 branch occlusion. MRI acute infarction left MCA territory. No associated hemorrhage. Interventional radiology attempt revascularization of M2 occlusion but unsuccessful. Echocardiogram with ejection fraction of 60%. Systolic function was normal. Neurology follow-upplan was for INR to be less than 2.00and then begin ELIQUIS in place of Coumadin.Tolerating a regular diet. Therapy evaluations completed with recommendations of physical medicine rehabilitation consult. Patient was admitted for a comprehensive rehabilitation program.   Patient transferred to CIR on 10/06/2018 .    Patient currently requires mod with basic self-care skills secondary to muscle weakness, decreased cardiorespiratoy endurance, decreased attention to right, decreased attention, decreased awareness, decreased problem solving and decreased safety awareness and decreased standing balance, decreased postural control and decreased  balance strategies.  Prior to  hospitalization, patient could complete ADLs with min.  Patient will benefit from skilled intervention to decrease level of assist with basic self-care skills prior to discharge home with care partner.  Anticipate patient will require minimal physical assistance and follow up home health.  OT - End of Session Activity Tolerance: Tolerates 10 - 20 min activity with multiple rests Endurance Deficit: Yes Endurance Deficit Description: fatigue, refusing to participate OT Assessment Rehab Potential (ACUTE ONLY): Fair OT Patient demonstrates impairments in the following area(s): Balance;Behavior;Cognition;Endurance;Motor;Pain;Safety OT Basic ADL's Functional Problem(s): Grooming;Bathing;Dressing;Toileting OT Transfers Functional Problem(s): Toilet;Tub/Shower OT Additional Impairment(s): None OT Plan OT Intensity: Minimum of 1-2 x/day, 45 to 90 minutes OT Frequency: Total of 15 hours over 7 days of combined therapies OT Duration/Estimated Length of Stay: 7-10 days OT Treatment/Interventions: Balance/vestibular training;Cognitive remediation/compensation;Discharge planning;Disease mangement/prevention;DME/adaptive equipment instruction;Functional mobility training;Neuromuscular re-education;Pain management;Patient/family education;Psychosocial support;Self Care/advanced ADL retraining;Therapeutic Activities;Therapeutic Exercise;UE/LE Strength taining/ROM;UE/LE Coordination activities OT Self Feeding Anticipated Outcome(s): supervision/setup OT Basic Self-Care Anticipated Outcome(s): Min assist OT Toileting Anticipated Outcome(s): Min assist OT Bathroom Transfers Anticipated Outcome(s): Min assist OT Recommendation Patient destination: Home Follow Up Recommendations: Home health OT Equipment Recommended: Tub/shower bench   Skilled Therapeutic Intervention OT eval completed with discussion of rehab process, OT purpose, POC, ELOS, and goals.  ADL assessment with focus on functional transfers, LB  dressing, and following one step commands. Pt received on toilet with nurse tech present.  Pt required mod multimodal cues for sequencing and initiation to complete hygiene post toileting.  Pt speaking nonsensically when therapist cuing to initiate self-care tasks, however making it clear that she was not bathing this session.  Pt donned underwear and pants post toileting with assist to thread BLE and then pt pulling both over hips in standing with min assist for standing balance.  Pt completed grooming with setup assist to open toothpaste.  Pt's husband reports that he would thread BLE and she would pull underwear and pants up without assistance PTA.  Pt agreeable to going to ADL apt to complete transfers, however once tub/shower set up with tub bench pt became increasingly loud, yelling that "I won't do it"  "I will do it my way in my bathroom, when I go home".  Pt's daughter and husband present and attempted to calm pt down and encourage her to participate in simulated transfers with pt getting increasingly louder and swatting and daughter.  Pt returned to room and back to bed, yelling "I'm going to be sick" "I'm going to throw up".  Pt's daughter states that pt would get increasingly frustrated and refuse to complete tasks PTA when she would get tired or just want to be left alone.  Mod assist stand pivot transfer back to bed.  Completed assessment via reports from family due to decreased participation.  OT Evaluation Precautions/Restrictions  Precautions Precautions: Fall;Other (comment) Precaution Comments: chronic back pain for which she takes daily narcotics Restrictions Weight Bearing Restrictions: No General   Vital Signs Therapy Vitals Pulse Rate: 89 BP: 140/85 Patient Position (if appropriate): Sitting Oxygen Therapy SpO2: 99 % O2 Device: Room Air Pain Pain Assessment Pain Scale: 0-10 Pain Score: 0-No pain Home Living/Prior Functioning Home Living Family/patient expects to be  discharged to:: Private residence Living Arrangements: Spouse/significant other Available Help at Discharge: Family, Available 24 hours/day Type of Home: House Home Access: Stairs to enter [employer] of Steps: 2 Entrance Stairs-Rails: None Home Layout: One level Bathroom Shower/Tub: Tub/shower unit(shower seat and hand held shower head) Bathroom  Toilet: Standard  Lives With: Spouse IADL History Homemaking Responsibilities: No Prior Function Level of Independence: Needs assistance with ADLs, Needs assistance with gait  Able to Take Stairs?: Yes Driving: No Vocation: Retired Comments: hx of osteoporosis with vertebral fxs limiting function. hx of hip fx. Pt already has SPC and RW that she was using prior ADL ADL Grooming: Setup Where Assessed-Grooming: Sitting at sink Lower Body Bathing: Moderate assistance Upper Body Dressing: Moderate assistance Where Assessed-Upper Body Dressing: Wheelchair Lower Body Dressing: Moderate assistance Where Assessed-Lower Body Dressing: Wheelchair Toileting: Minimal assistance Where Assessed-Toileting: Glass blower/designer: Moderate assistance Toilet Transfer Method: Stand pivot Science writer: Grab bars Vision Baseline Vision/History: Wears glasses Wears Glasses: Reading only Vision Assessment?: Vision impaired- to be further tested in functional context Additional Comments: difficult to assess due to aphasia and decreased participation Cognition Overall Cognitive Status: Difficult to assess Arousal/Alertness: Awake/alert Attention: Sustained Sustained Attention: Impaired Awareness: Impaired Safety/Judgment: Impaired Comments: difficult to assess cognition and unable to complete BIMS due to aphasia.  Pt with decreased participation, refusing any activity, and verbally escalating.   Sensation Sensation Light Touch: Appears Intact Proprioception: Appears Intact Additional Comments: sensation appears intact,  unable to formally assess secondary to aphasia Coordination Gross Motor Movements are Fluid and Coordinated: No Fine Motor Movements are Fluid and Coordinated: No Motor  Motor Motor: Hemiplegia Motor - Skilled Clinical Observations: mild R hemiparesis Extremity/Trunk Assessment RUE Assessment RUE Assessment: Within Functional Limits General Strength Comments: mild hemiplegia, however able to utilize during functional tasks LUE Assessment LUE Assessment: Within Functional Limits     Refer to Care Plan for Long Term Goals  Recommendations for other services: None    Discharge Criteria: Patient will be discharged from OT if patient refuses treatment 3 consecutive times without medical reason, if treatment goals not met, if there is a change in medical status, if patient makes no progress towards goals or if patient is discharged from hospital.  The above assessment, treatment plan, treatment alternatives and goals were discussed and mutually agreed upon: by patient and by family  Ellwood Dense [facility] 10/07/2018, 3:05 PM

## 2018-10-07 NOTE — Evaluation (Signed)
Speech Language Pathology Assessment and Plan  Patient Details  Name: [name] MRN: [id number] Date of Birth: 07/12/1930  SLP Diagnosis: Other (comment)(see impressions statement - probably aphasia)  Rehab Potential: Poor ELOS: 12 to 14 days (changed to 15 over 7 d/t behaviors)    Today's Date: 10/07/2018 SLP Individual Time: 1030-1130 SLP Individual Time Calculation (min): 60 min   Problem List:  Patient Active Problem List   Diagnosis Date Noted  . Left middle cerebral artery stroke (HCC) 10/06/2018  . Dyslipidemia   . Diabetes mellitus type 2 in nonobese (HCC)   . Atrial fibrillation (HCC)   . Tachypnea   . Stage 3 chronic kidney disease (HCC)   . Stroke (HCC) ischemic embolic L MCA  d/t AF 10/03/2018  . 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 Frequent falls 10/13/2017  . Vitamin D deficiency 04/22/2017  . Slurring of speech 03/31/2017  . Back pain 09/14/2016  . Compression fracture of thoracic vertebra (HCC) 09/14/2016  . S/P vertebroplasty 09/14/2016  . Chronic diastolic heart failure (HCC) 09/14/2016  . HTN (hypertension) 09/14/2016  . History of CVA (cerebrovascular accident) 09/14/2016  . Hypokalemia 09/11/2016  . Paraspinal hematoma 09/11/2016  . Osteoporosis 05/27/2016  . Hypothyroidism 09/11/2014  . Encounter for therapeutic drug monitoring 11/28/2013  . Persistent atrial fibrillation (HCC) 12/15/2012  . HLD (hyperlipidemia) 03/27/2011   Past Medical History:  Past Medical History:  Diagnosis Date  . Atrial fibrillation (HCC)   . Chronic anticoagulation   . Chronic anticoagulation   . CVA (cerebrovascular accident) (HCC)   . Diabetes mellitus   . History of chicken pox   . Hyperlipidemia   . Hypertension    Past Surgical History:  Past Surgical History:  Procedure Laterality Date  . APPENDECTOMY    . BACK SURGERY    . CARDIAC CATHETERIZATION  11/09/91   EF 70%  . DILATION AND CURETTAGE OF UTERUS    . IR PERCUTANEOUS  ART THROMBECTOMY/INFUSION INTRACRANIAL INC DIAG ANGIO  10/03/2018  . RADIOLOGY WITH ANESTHESIA N/A 10/03/2018   Procedure: IR WITH ANESTHESIA;  Surgeon: Radiologist, Medication, MD;  Location[facility];  Service: Radiology;  Laterality: N/A;    Assessment / Plan / Recommendation Clinical Impression [name] is an 84-year-old right handed female with history of hypertension, hyperlipidemia,diet controlleddiabetes mellitus, CVA 10 years ago on chronic anticoagulation secondary to atrial fibrillation followed by Dr. Kingsbury cardiology services,chronic back pain on prn percocet.. Per chart review, patient lives with spouse. One level home with 2 steps to entry. Patient required a cane for mobility but required some hand on assistance for transfers also. Presented 10/03/2018 with sudden onset of right-sided weakness and speech difficulty. INR on admission of 2.52.Cranial CT scan reviewed, unremarkable for acute intracranial process. CT angiogram of head and neck showed left M2 branch occlusion. MRI acute infarction left MCA territory. No associated hemorrhage. Interventional radiology attempt revascularization of M2 occlusion but unsuccessful. Echocardiogram with ejection fraction of 60%. Systolic function was normal. Neurology follow-upplan was for INR to be less than 2.00and then begin ELIQUIS in place of Coumadin.Tolerating a regular diet. Therapy evaluations completed with recommendations of physical medicine rehabilitation consult.   Patient was admitted for a comprehensive rehabilitation program on 10/07/18. Pt's daughter and husband were present during this evaluation. This writer spent a total of 60 minutes attempting mutliple strategies to engage pt in therpay tasks. SLP attempted having pt hold phone to look at grandchildren's pictures, having grandson call her to talk   to her, giving pt set number of items to complete. Pt continually refused and was fluent when stating "I told you,  I don't want too, come back later, I told you I am not going to." Pt able to read time correctly on clock but continued to refuse when SLP came back after 10 minutes for therapy. Pt became increasingly aggressive and therapy was discontinued. Husband and daughter both state that pt had baseline behaviors of controlling situation, being extremely argumentative after most recent procedures (2017), irrationale and unmotivated to perform tasks, manipulative with reports of pain and family has threatened pt with SNF placmeent but pt is jot motivated by this "threat" as she knows they aren't going to "put her in a SNF." Family didn't make these baseline behaviors known to Admissions Coordinator becasue they "hoped she would be different this time." Recommend pt be changed to 15 over 7 with possibility of pt discharging d/t refusal.     Skilled Therapeutic Interventions          Skilled treatment session focused on promoting any participation or engagement in tasks. No strategies were effective. Pt's therapy times changed to 15 over 7.    SLP Assessment  Patient will need skilled Speech Lanaguage Pathology Services during CIR admission    Recommendations  Patient destination: Home Follow up Recommendations: None Equipment Recommended: None recommended by SLP    SLP Frequency 3 to 5 out of 7 days   SLP Duration  SLP Intensity  SLP Treatment/Interventions 12 to 14 days (changed to 15 over 7 d/t behaviors)  Minumum of 1-2 x/day, 30 to 90 minutes  Patient/family education;Speech/Language facilitation    Pain Pain Assessment Pain Scale: 0-10 Pain Score: 0-No pain  Prior Functioning Cognitive/Linguistic Baseline: Baseline deficits Baseline deficit details: pt was manipulative, controlling and vendictive at baseline, she frequently used pain as an excuse for not performing tasks Type of Home: House  Lives With: Spouse Available Help at Discharge: Family;Available 24 hours/day Vocation:  Retired  Short Term Goals: Week 1: SLP Short Term Goal 1 (Week 1): Pt will complete basic problem solving tasks with Max A cues.  SLP Short Term Goal 2 (Week 1): Pt will utilize word finding strategies to communicate at the sentence level with Max A cues.   Refer to Care Plan for Long Term Goals  Recommendations for other services: None   Discharge Criteria: Patient will be discharged from SLP if patient refuses treatment 3 consecutive times without medical reason, if treatment goals not met, if there is a change in medical status, if patient makes no progress towards goals or if patient is discharged from hospital.  The above assessment, treatment plan, treatment alternatives and goals were discussed and mutually agreed upon: by patient and by family  [name] 10/07/2018, 1:07 PM  

## 2018-10-07 NOTE — Care Management Note (Signed)
[facility] Individual Statement of Services  Patient Name:  [name]  Date:  10/07/2018  Welcome to the [facility].  Our goal is to provide you with an individualized program based on your diagnosis and situation, designed to meet your specific needs.  With this comprehensive rehabilitation program, you will be expected to participate in at least 3 hours of rehabilitation therapies Monday-Friday, with modified therapy programming on the weekends.  Your rehabilitation program will include the following services:  Physical Therapy (PT), Occupational Therapy (OT), Speech Therapy (ST), 24 hour per day rehabilitation nursing, Case Management (Social Worker), Rehabilitation Medicine, Nutrition Services and Pharmacy Services  Weekly team conferences will be held on Wednesday to discuss your progress.  Your Social Worker will talk with you frequently to get your input and to update you on team discussions.  Team conferences with you and your family in attendance may also be held.  Expected length of stay: 12-14 days Overall anticipated outcome: min-CGA level  Depending on your progress and recovery, your program may change. Your Social Worker will coordinate services and will keep you informed of any changes. Your Social Worker's name and contact numbers are listed  below.  The following services may also be recommended but are not provided by the Inpatient [facility]:    Home Health Rehabiltiation Services  Outpatient Rehabilitation Services    Arrangements will be made to provide these services after discharge if needed.  Arrangements include referral to agencies that provide these services.  Your insurance has been verified to be:  Medicare & AARP Your primary doctor is:  Guerry BruinRichard Tisovec  Pertinent information will be shared with your doctor and your insurance company.  Social Worker:  Dossie DerBecky Larrissa Stivers, SW [phone] or ([phone])  [phone]  Information discussed with and copy given to patient by: Lucy Chrisupree, Drevion Offord G, 10/07/2018, 10:13 AM

## 2018-10-08 ENCOUNTER — Inpatient Hospital Stay (HOSPITAL_COMMUNITY): Payer: Medicare Other

## 2018-10-08 LAB — PROTIME-INR
INR: 1.72
Prothrombin Time: 19.9 seconds — ABNORMAL HIGH (ref 11.4–15.2)

## 2018-10-08 NOTE — Progress Notes (Signed)
Occupational Therapy Session Note  Patient Details  Name: [name] MRN: [id number] Date of Birth: Feb 19, 1930  Today's Date: 10/08/2018 OT Individual Time: 1400-1410 OT Individual Time Calculation (min): 10 min  and Today's Date: 10/08/2018 OT Missed Time: 20 Minutes Missed Time Reason: Patient fatigue;Patient ill (comment)   Short Term Goals: Week 1:  OT Short Term Goal 1 (Week 1): STG = LTGs due to ELOS  Skilled Therapeutic Interventions/Progress Updates:    Care coordination with PT re previous session in which pt was feeling unwell, becoming pale, experiencing loose stools, and c/o stomach pain. Spoke with pt and offered brief emotional support and therapeutic touch. Pt was brought a ginger ale and crackers and refused any further participation in therapy. Pt left supine with all needs met, bed alarm set.   Therapy Documentation Precautions:  Precautions Precautions: Fall, Other (comment) Precaution Comments: chronic back pain for which she takes daily narcotics Restrictions Weight Bearing Restrictions: No General: General OT Amount of Missed Time: 20 Minutes   Pain: Pain Assessment Pain Scale: Faces Pain Score: 0-No pain Faces Pain Scale: Hurts little more Pain Location: Abdomen Pain Descriptors / Indicators: Aching Pain Intervention(s): Repositioned   Therapy/Group: Individual Therapy  Curtis Sites 10/08/2018, 2:28 PM

## 2018-10-08 NOTE — Plan of Care (Signed)
  Problem: RH SAFETY Goal: RH STG ADHERE TO SAFETY PRECAUTIONS W/ASSISTANCE/DEVICE Description STG Adhere to Safety Precautions With Min Assistance/Device.  Outcome: Progressing  Telesitter, call light within reach, bed/chair alarm, proper footwear  Problem: RH COGNITION-NURSING Goal: RH STG USES MEMORY AIDS/STRATEGIES W/ASSIST TO PROBLEM SOLVE Description STG Uses Memory Aids/Strategies With Min Assistance to Problem Solve.  Outcome: Progressing  Patient exhibit unpredictable behavior at times. Needs motivation and encouragement

## 2018-10-08 NOTE — Progress Notes (Signed)
Occupational Therapy Session Note  Patient Details  Name: [name] MRN: [id number] Date of Birth: 1930-01-07  Today's Date: 10/08/2018 OT Individual Time: 8185-9093 OT Individual Time Calculation (min): 57 min    Short Term Goals: Week 1:  OT Short Term Goal 1 (Week 1): STG = LTGs due to ELOS  Skilled Therapeutic Interventions/Progress Updates:    Session focused on b/d tasks at sink level. Pt with no c/o pain, refusing and becoming agitated at mention of shower. Pt completed stand pivot transfer bed > w/c with min A. Pt completed UB bathing/dressing with set up and cueing for task progression. Min A to clasp bra posteriorly. Pt completed LB bathing in sit <> stand with CGA, 1 posterior LOB where pt abruptly sat down. Pt fearful re. Falling and requiring reassurance/emotional support. Min A to don LB clothing over distal LE. Pt completed grooming tasks at sink with set up only. Attempted to engage pt in several other therapeutic activites, such as blanket making and christmas activities but pt would engage briefly and then refuse. Pt was returned to her room where she donned/doffed socks with set up and mod cueing. Pt left sitting up in w/c with all needs met, family present and telesitter on.   Therapy Documentation Precautions:  Precautions Precautions: Fall, Other (comment) Precaution Comments: chronic back pain for which she takes daily narcotics Restrictions Weight Bearing Restrictions: No Vital Signs: Therapy Vitals Pulse Rate: (!) 105 Resp: 18 BP: 130/79 Patient Position (if appropriate): Lying Oxygen Therapy SpO2: 99 % O2 Device: Room Air Pain: Pain Assessment Pain Scale: 0-10 Pain Score: 0-No pain   Therapy/Group: Individual Therapy  Curtis Sites 10/08/2018, 11:30 AM

## 2018-10-08 NOTE — Progress Notes (Signed)
Physical Therapy Session Note  Patient Details  Name: [name] MRN: [id number] Date of Birth: [birth year]  Today's Date: 10/08/2018 PT Individual Time: 1300-1400 PT Individual Time Calculation (min): 60 min   Short Term Goals: Week 1:  PT Short Term Goal 1 (Week 1): Pt will ambulate 50 ft with LRAD and min assist PT Short Term Goal 2 (Week 1): Pt will perform bed<>chair transfers with min assist PT Short Term Goal 3 (Week 1): Pt will perform bed mobility with CGA  Skilled Therapeutic Interventions/Progress Updates:    Pt seated in w/c upon PT arrival, agreeable to therapy tx and denies pain. Pt reports she just does not feel right. Pt transported to the gym in w/c dependent. Pt ambulated 2 x 75 ft this session with RW and CGA, verbal cues for upright posture, working on endurance and activity tolerance. Pt transported to the dayroom and performed stand pivot to nustep with min assist, verbal cues for safety. Pt used nustep x 4 minutes using B UEs/LEs. Pt reports she can't do anymore. This therapist able to figure out that pt was communicating that her stomach hurt and she had to use the bathroom. Pt transported back to her room, on the way pt incontinent of bowel and becomes very upset with herself. Therapist able to calm pt and get pt to stand pivot to toilet. Pt performed sit<>stands with CGA for clothing management and peri care, pt reports not feeling well, feeling faint. Pt performed stand pivot to w/c and stand pivot to bed with min assist. Pt left supine with needs in reach and bed alarm set, therapist reported to RN about pt not feeling well.   Therapy Documentation Precautions:  Precautions Precautions: Fall, Other (comment) Precaution Comments: chronic back pain for which she takes daily narcotics Restrictions Weight Bearing Restrictions: No   Therapy/Group: Individual Therapy  Cresenciano GenreEmily van Schagen, PT, DPT 10/08/2018, 8:01 AM

## 2018-10-08 NOTE — Progress Notes (Signed)
[name] is a 84 y.o. female who was admitted for CIR 2 days ago with right-sided weakness and aphasia secondary to a left MCA infarction and unsuccessful IR attempts at revascularization  Past Medical History:  Diagnosis Date  . Atrial fibrillation (HCC)   . Chronic anticoagulation   . Chronic anticoagulation   . CVA (cerebrovascular accident) (HCC)   . Diabetes mellitus   . History of chicken pox   . Hyperlipidemia   . Hypertension      Subjective: No new complaints. No new problems.  Slept reasonably well  Objective: Vital signs in last 24 hours: Temp:  [98.4 F (36.9 C)-98.6 F (37 C)] 98.6 F (37 C) (12/21 0415) Pulse Rate:  [64-105] 105 (12/21 0747) Resp:  [18] 18 (12/21 0747) BP: (107-140)/(56-79) 130/79 (12/21 0747) SpO2:  [99 %-100 %] 99 % (12/21 0747) Weight change:  Last BM Date: 10/07/18  Intake/Output from previous day: 12/20 0701 - 12/21 0700 In: 360 [P.O.:360] Out: -   Patient Vitals for the past 24 hrs:  BP Temp Temp src Pulse Resp SpO2  10/08/18 0747 130/79 - - (!) 105 18 99 %  10/08/18 0415 (!) 107/56 98.6 F (37 C) Oral 64 18 100 %  10/07/18 1941 140/75 98.4 F (36.9 C) Oral 79 - 100 %     Physical Exam General: No apparent distress   HEENT: not dry Lungs: Normal effort. Lungs clear to auscultation, no crackles or wheezes. Cardiovascular: Irregular rhythm with controlled rate Abdomen: S/NT/ND; BS(+) Musculoskeletal:  unchanged Neurological: No new neurological deficits; moves both arms well Wounds: N/A    Skin: clear   Mental state: Alert, oriented, cooperative    Lab Results: BMET    Component Value Date/Time   NA 137 10/07/2018 0522   K 3.5 10/07/2018 0522   CL 106 10/07/2018 0522   CO2 20 (L) 10/07/2018 0522   GLUCOSE 112 (H) 10/07/2018 0522   BUN 11 10/07/2018 0522   CREATININE 1.42 (H) 10/07/2018 0522   CREATININE 0.83 10/13/2017 1658   CALCIUM 7.5 (L) 10/07/2018 0522   GFRNONAA 33 (L) 10/07/2018 0522   GFRAA 39 (L) 10/07/2018 0522   CBC    Component Value Date/Time   WBC 7.9 10/07/2018 0522   RBC 3.06 (L) 10/07/2018 0522   HGB 10.0 (L) 10/07/2018 0522   HGB 14.6 05/11/2018 1454   HCT 29.7 (L) 10/07/2018 0522   HCT 44.3 05/11/2018 1454   PLT 273 10/07/2018 0522   PLT 230 05/11/2018 1454   MCV 97.1 10/07/2018 0522   MCV 95 05/11/2018 1454   MCH 32.7 10/07/2018 0522   MCHC 33.7 10/07/2018 0522   RDW 12.3 10/07/2018 0522   RDW 12.1 (L) 05/11/2018 1454   LYMPHSABS 2.2 10/07/2018 0522   LYMPHSABS 1.9 05/11/2018 1454   MONOABS 0.8 10/07/2018 0522   EOSABS 0.1 10/07/2018 0522   EOSABS 0.1 05/11/2018 1454   BASOSABS 0.0 10/07/2018 0522   BASOSABS 0.0 05/11/2018 1454     Medications: I have reviewed the patient's current medications.  Assessment/Plan:  Status post left MCA infarction with improving right-sided weakness and dysphasia Chronic atrial fibrillation.  Continue Eliquis Essential hypertension.  Continue amlodipine and Corgard Dyslipidemia continue atorvastatin Diet-controlled diabetes    Length of stay, days: 2  Gordy SaversPeter F Asia Favata , MD 10/08/2018, 9:19 AM

## 2018-10-08 NOTE — Progress Notes (Signed)
PT noted, "pt was N/V/D, faint and pale." Went to assess the patient she noted she did not feel well and wanted to rest. Pt participated in therapy today. The event subsided.

## 2018-10-09 ENCOUNTER — Inpatient Hospital Stay (HOSPITAL_COMMUNITY): Payer: Medicare Other | Admitting: Occupational Therapy

## 2018-10-09 ENCOUNTER — Inpatient Hospital Stay (HOSPITAL_COMMUNITY): Payer: Medicare Other

## 2018-10-09 LAB — PROTIME-INR
INR: 1.67
Prothrombin Time: 19.5 seconds — ABNORMAL HIGH (ref 11.4–15.2)

## 2018-10-09 NOTE — Progress Notes (Signed)
Occupational Therapy Note  Patient Details  Name: [name] MRN: [id number] Date of Birth: 1929/12/20  Today's Date: 10/09/2018 OT Missed Time: 60 Minutes Missed Time Reason: Patient ill (comment);Patient fatigue  Pt refused 60 min skilled OT d/t being ill/diarrhea all night/morning. Pt pleasantly declines all bed leve/EOB level activities presented. Will attempt to make up time as available.    Elenore PaddyStephanie M Jhade Berko 10/09/2018, 1:59 PM

## 2018-10-09 NOTE — Progress Notes (Signed)
[name] is a 84 y.o. female who is admitted for CIR following a left MCA infarction with disability related to right-sided weakness.  Past Medical History:  Diagnosis Date  . Atrial fibrillation (HCC)   . Chronic anticoagulation   . Chronic anticoagulation   . CVA (cerebrovascular accident) (HCC)   . Diabetes mellitus   . History of chicken pox   . Hyperlipidemia   . Hypertension      Subjective: No new complaints. No new problems.  No focal complaints but feels a bit unwell  Objective: Vital signs in last 24 hours: Temp:  [97.7 F (36.5 C)-98.3 F (36.8 C)] 98 F (36.7 C) (12/22 0551) Pulse Rate:  [76-94] 94 (12/22 0551) Resp:  [18] 18 (12/22 0551) BP: (116-126)/(70-74) 116/74 (12/22 0551) SpO2:  [96 %-100 %] 100 % (12/22 0551) Weight change:  Last BM Date: 10/08/18  Intake/Output from previous day: 12/21 0701 - 12/22 0700 In: 560 [P.O.:560] Out: -    Lab Results  Component Value Date   INR 1.67 10/09/2018   INR 1.72 10/08/2018   INR 1.53 10/07/2018      Physical Exam General: No apparent distress   HEENT: not dry Lungs: Normal effort. Lungs clear to auscultation, no crackles or wheezes. Cardiovascular: Irregular rhythm with a controlled rate;  no edema Abdomen: S/NT/ND; BS(+) Musculoskeletal:  unchanged Neurological: No new neurological deficits with dysarthria Wounds: N/A    Skin: clear no rash Mental state: Alert, oriented, cooperative    Lab Results: BMET    Component Value Date/Time   NA 137 10/07/2018 0522   K 3.5 10/07/2018 0522   CL 106 10/07/2018 0522   CO2 20 (L) 10/07/2018 0522   GLUCOSE 112 (H) 10/07/2018 0522   BUN 11 10/07/2018 0522   CREATININE 1.42 (H) 10/07/2018 0522   CREATININE 0.83 10/13/2017 1658   CALCIUM 7.5 (L) 10/07/2018 0522   GFRNONAA 33 (L) 10/07/2018 0522   GFRAA 39 (L) 10/07/2018 0522   CBC    Component Value Date/Time   WBC 7.9 10/07/2018 0522   RBC 3.06 (L) 10/07/2018 0522   HGB 10.0 (L)  10/07/2018 0522   HGB 14.6 05/11/2018 1454   HCT 29.7 (L) 10/07/2018 0522   HCT 44.3 05/11/2018 1454   PLT 273 10/07/2018 0522   PLT 230 05/11/2018 1454   MCV 97.1 10/07/2018 0522   MCV 95 05/11/2018 1454   MCH 32.7 10/07/2018 0522   MCHC 33.7 10/07/2018 0522   RDW 12.3 10/07/2018 0522   RDW 12.1 (L) 05/11/2018 1454   LYMPHSABS 2.2 10/07/2018 0522   LYMPHSABS 1.9 05/11/2018 1454   MONOABS 0.8 10/07/2018 0522   EOSABS 0.1 10/07/2018 0522   EOSABS 0.1 05/11/2018 1454   BASOSABS 0.0 10/07/2018 0522   BASOSABS 0.0 05/11/2018 1454    Medications: I have reviewed the patient's current medications.  Assessment/Plan:  Functional deficits following left MCA infarction. Chronic atrial fibrillation Chronic anticoagulation with Eliquis.  Will discontinue daily INRs Diet-controlled diabetes Dyslipidemia.  Continue statin therapy    Length of stay, days: 3  Gordy SaversPeter F Franck Vinal , MD 10/09/2018, 9:13 AM

## 2018-10-09 NOTE — Progress Notes (Signed)
2 mushy, continent BM's during night. Difficult to assess R/T Expressive aphasia, Able to figure out,  patient with abdominal discomfort, not nausea. Alfredo MartinezMurray, Senie Lanese A

## 2018-10-09 NOTE — Progress Notes (Signed)
Physical Therapy Session Note  Patient Details  Name: [name] MRN: [id number] Date of Birth: [birth year]  Today's Date: 10/09/2018 PT Individual Time: [id number] PT Individual Time Calculation (min): 15 min  and Today's Date: 10/09/2018 PT Missed Time: 45 Minutes Missed Time Reason: Patient unwilling to participate  Short Term Goals: Week 1:  PT Short Term Goal 1 (Week 1): Pt will ambulate 50 ft with LRAD and min assist PT Short Term Goal 2 (Week 1): Pt will perform bed<>chair transfers with min assist PT Short Term Goal 3 (Week 1): Pt will perform bed mobility with CGA  Skilled Therapeutic Interventions/Progress Updates:    Pt refused therapy upon PT arrival, therapist attempted to encourage and educate pt on participation, pt continues to decline.   Therapist returns later in the day, pt agreeable to get OOB to use bathroom. Pt ambulated from bed<>bathroom x 10 ft and performed clothing management/cleaned peri area with supervision. Pt ambulated to sink with min assist and washed hands. Pt ambulated x 100 ft with rollator and min assist and then asked to go back to room. Refused further therapy. Pt left seated in recliner with needs in reach and chair alarm set.   Therapy Documentation Precautions:  Precautions Precautions: Fall, Other (comment) Precaution Comments: chronic back pain for which she takes daily narcotics Restrictions Weight Bearing Restrictions: No    Therapy/Group: Individual Therapy  Cresenciano GenreEmily van Schagen, PT, DPT 10/09/2018, 7:55 AM

## 2018-10-09 NOTE — Progress Notes (Signed)
Occupational Therapy Session Note  Patient Details  Name: [name] MRN: [id number] Date of Birth: 07/30/1930  Today's Date: 10/09/2018 OT Individual Time: [id number] OT Individual Time Calculation (min): 17 min  and Today's Date: 10/09/2018 OT Missed Time: 13 Minutes Missed Time Reason: Patient unwilling/refused to participate without medical reason   Short Term Goals: Week 1:  OT Short Term Goal 1 (Week 1): STG = LTGs due to ELOS  Skilled Therapeutic Interventions/Progress Updates:    Pt seen for OT session focusing on orientation and functional mobility. Pt in supine upon arrival with husband present. Pt awake and with encouragement willing to come sitting EOB. Seated EOB, addressed general orientation, oriented to person only. Aphasia limiting as pt hyper-verbal, however, not understandable. Attempted to use newspaper to assist with orientation, however, unsuccessful in attempt. With significant education and encouragement from therapist and pt's husband, pt agreeable to walking to doorway. Ambulated with rollator and CGA fading to close supervision to door way and turned around in hallway and returned to bed. Pt refusing any other activity following this. Pt left in supine, all needs in reach, bed alarm on and husband present.   Therapy Documentation Precautions:  Precautions Precautions: Fall, Other (comment) Precaution Comments: chronic back pain for which she takes daily narcotics Restrictions Weight Bearing Restrictions: No Pain:   No/denies pain   Therapy/Group: Individual Therapy  Wyonia Fontanella L 10/09/2018, 6:53 AM

## 2018-10-10 ENCOUNTER — Inpatient Hospital Stay (HOSPITAL_COMMUNITY): Payer: Medicare Other

## 2018-10-10 ENCOUNTER — Inpatient Hospital Stay (HOSPITAL_COMMUNITY): Payer: Medicare Other | Admitting: Occupational Therapy

## 2018-10-10 ENCOUNTER — Inpatient Hospital Stay (HOSPITAL_COMMUNITY): Payer: Medicare Other | Admitting: Physical Therapy

## 2018-10-10 ENCOUNTER — Inpatient Hospital Stay (HOSPITAL_COMMUNITY): Payer: Medicare Other | Admitting: Speech Pathology

## 2018-10-10 NOTE — Consult Note (Signed)
            [facility]  10/10/2018  Liliane BadeCarolyn W Halina AndreasWhite Reichard 1930-04-24 [id number]   Attemptto see patient at the bedside to identify possible discharge needs but she [facility]  Per MD note, patient was admitted due to having difficulty expressing herself and understanding, flaccid on her right and became agitated. (Stroke: left MCA infarct due to left M2 occlusion, persistent atrial fibrillation)   Primary care provider's office is listed as providing transition of care (TOC) follow-up.  Patient has discharge instruction to follow-up with primary care provider in 2 weeks following discharge from rehab and  follow-up with [facility] in 4 weeks following discharge from rehab.   For additional questions please contact:  Karin GoldenLorraine A. Layia Walla, BSN, RN-BC [facility] PRIMARY [facility] Navigator Cell: [phone]

## 2018-10-10 NOTE — Progress Notes (Signed)
Speech Language Pathology Daily Session Note  Patient Details  Name: Pincus LargeCarolyn W White Reichard MRN: [id number] Date of Birth: 03-16-1930  Today's Date: 10/10/2018 SLP Individual Time: 1130-1200 SLP Individual Time Calculation (min): 30 min  Short Term Goals: Week 1: SLP Short Term Goal 1 (Week 1): Pt will complete basic problem solving tasks with Max A cues.  SLP Short Term Goal 2 (Week 1): Pt will utilize word finding strategies to communicate at the sentence level with Max A cues.   Skilled Therapeutic Interventions:Skilled ST services focused on cognitive skills. SLP facilitated basic problem solving skills utilizing 3-4 step sequence cards pt required supervision A verbal cues and given 5 step sequence cards required mod A verbal cues. SLP facilitated word finding skills at sentence level in picture description task, however 50% accuracy with max A verbal cues and required max A verbal cues to recognize errors. Pt demonstrated awareness of some errors when questioned but required max A verbal cues to monitor in the moment. Pt was left in room with call bell within reach, husband present and chair alarm set. Recommend to continue skilled ST services.      Pain Pain Assessment Pain Score: 0-No pain  Therapy/Group: Individual Therapy  Jaylaa Gallion  [facility] 10/10/2018, 5:04 PM

## 2018-10-10 NOTE — Progress Notes (Signed)
[place] PHYSICAL MEDICINE & REHABILITATION PROGRESS NOTE   Subjective/Complaints:  No issues overnite, remains with fluent aphasic  ROS- unable to obtain due to aphasia Objective:   No results found. No results for input(s): WBC, HGB, HCT, PLT in the last 72 hours. No results for input(s): NA, K, CL, CO2, GLUCOSE, BUN, CREATININE, CALCIUM in the last 72 hours.  Intake/Output Summary (Last 24 hours) at 10/10/2018 0807 Last data filed at 10/10/2018 0751 Gross per 24 hour  Intake 720 ml  Output -  Net 720 ml     Physical Exam: Vital Signs Blood pressure 137/76, pulse 74, temperature 98 F (36.7 C), temperature source Oral, resp. rate 18, height 5\' 7"  (1.702 m), weight 54.1 kg, SpO2 100 %.     Assessment/Plan: 1. Functional deficits secondary to Left post branch MCA infarct which require 3+ hours per day of interdisciplinary therapy in a comprehensive inpatient rehab setting.  Physiatrist is providing close team supervision and 24 hour management of active medical problems listed below.  Physiatrist and rehab team continue to assess barriers to discharge/monitor patient progress toward functional and medical goals  Care Tool:  Bathing    Body parts bathed by patient: Right arm, Left arm, Abdomen, Chest, Front perineal area, Buttocks, Right upper leg, Left upper leg, Right lower leg, Left lower leg, Face         Bathing assist Assist Level: Contact Guard/Touching assist     Upper Body Dressing/Undressing Upper body dressing   What is the patient wearing?: Pull over shirt    Upper body assist Assist Level: Minimal Assistance - Patient > 75%    Lower Body Dressing/Undressing Lower body dressing      What is the patient wearing?: Underwear/pull up     Lower body assist Assist for lower body dressing: Minimal Assistance - Patient > 75%     Toileting Toileting    Toileting assist Assist for toileting: Minimal Assistance - Patient > 75%      Transfers Chair/bed transfer  Transfers assist     Chair/bed transfer assist level: Minimal Assistance - Patient > 75%     Locomotion Ambulation   Ambulation assist      Assist level: Minimal Assistance - Patient > 75% Assistive device: Walker-rolling Max distance: 100 ft   Walk 10 feet activity   Assist     Assist level: Minimal Assistance - Patient > 75% Assistive device: Walker-rolling   Walk 50 feet activity   Assist Walk 50 feet with 2 turns activity did not occur: Safety/medical concerns  Assist level: Minimal Assistance - Patient > 75% Assistive device: Walker-rolling    Walk 150 feet activity   Assist Walk 150 feet activity did not occur: Safety/medical concerns         Walk 10 feet on uneven surface  activity   Assist Walk 10 feet on uneven surfaces activity did not occur: Safety/medical concerns         Wheelchair     Assist Will patient use wheelchair at discharge?: Yes Type of Wheelchair: Manual Wheelchair activity did not occur: Refused         Wheelchair 50 feet with 2 turns activity    Assist    Wheelchair 50 feet with 2 turns activity did not occur: Refused       Wheelchair 150 feet activity     Assist Wheelchair 150 feet activity did not occur: Refused        Medical Problem List and Plan: 1.Right side  weakness and aphasiasecondary to left MCA infarction due to left M2 occlusion status post IR attempts at revascularization unsuccessful 2. DVT Prophylaxis/Anticoagulation: CHECK INR DAILY AND BEGIN ELIQUIS 2.5 mg BID ONCE INR <2.00 3. Pain Management/chronic back pain:Patient on Percocet prior to admission. Will monitor with increased mobility and resume as needed versus low-dose tramadol 4. Mood:Provide emotional support 5. Neuropsych: This patientiscapable of making decisions on herown behalf. 6. Skin/Wound Care:Routine skin checks 7. Fluids/Electrolytes/Nutrition:Routine ins and  outs with follow-up chemistries 8. Hypertension. Norvasc 2.5 mg daily, Corgard 10 mg daily. Monitor with increased mobility 9. Atrial fibrillation. Cardiac rate controlled. Followed by Dr. Duke Salviaandolph with cardiology services 10. Hyperlipidemia. Lipitor 11. Hypothyroidism. Synthroid 12. Diet controlled diabetes mellitus. Hemoglobin A1c 5.2.    LOS: 4 days A FACE TO FACE EVALUATION WAS PERFORMED  Erick Colacendrew E Kirsteins 10/10/2018, 8:07 AM

## 2018-10-10 NOTE — Progress Notes (Signed)
Slept good. No BM's during night. Janice MartinezMurray, [name]

## 2018-10-10 NOTE — Progress Notes (Signed)
Occupational Therapy Session Note  Patient Details  Name: [name] MRN: [id number] Date of Birth: 1930-09-28  Today's Date: 10/10/2018 OT Individual Time: [id number] OT Individual Time Calculation (min): 57 min    Short Term Goals: Week 1:  OT Short Term Goal 1 (Week 1): STG = LTGs due to ELOS  Skilled Therapeutic Interventions/Progress Updates:    Treatment session with focus on ADL retraining, dynamic standing balance, functional mobility, and hands on education with pt's husband.  Pt received upright in bed with RN finishing with pt.  Pt agreeable to bathing at sink for UB bathing and dressing.  Pt ambulated to toilet with Rollator with min assist, pt with incorrect hand placement and would not lock brakes despite cues from therapist.  Pt completed toileting and LB dressing with CGA.  Pt ambulated back to sink and declined oral care.  Pt's husband arrived, discussed pro and cons of RW vs Rollator.  Discussed pt not safe with Rollator, therefore engaged in ambulation with RW.  Pt completed 3050' with RW with CGA from therapist.  Educated pt's husband on standing to pt's Rt due to Rt inattention.  Pt ambulated 100' with RW with husband providing min-CGA.  Discussed proper hand placement to ensure both pt and caregiver safety.  Pt's husband pleased with pt progress with ambulation.  Pt refusing to wear seat belt/chair alarm.  Educated on purpose with pt with increasing agitation and stating "I will not wear that".  Pt's husband present and educated on purpose of alarm.  Pt adamant about not wearing, therefore educated husband on being present for safety and calling for staff assist if pt needs to get up or if he is to leave.  Pt also has telesitter for safety.  RN and nurse tech aware of pt refusal of alarm.  Therapy Documentation Precautions:  Precautions Precautions: Fall, Other (comment) Precaution Comments: chronic back pain for which she takes daily  narcotics Restrictions Weight Bearing Restrictions: No Pain:  Pt with no c/o pain   Therapy/Group: Individual Therapy  Rosalio LoudHOXIE, Quida Glasser 10/10/2018, 10:28 AM

## 2018-10-10 NOTE — Progress Notes (Signed)
Speech Language Pathology Discharge Summary  Patient Details  Name: [name] MRN: [id number] Date of Birth: 1930-05-18  Today's Date: 10/11/2018 SLP Individual Time: 0900-0930 SLP Individual Time Calculation (min): 30 min   Skilled Therapeutic Interventions: Skilled ST services focused on speech skills and education. SLP facilitated expressive naming of common items in [facility] toolkit, pt named 4/10 with mod phonemic errors. Pt demonstrated awareness of 20% of verbal errors, however only able to correct errors 10% of opportunities with sentence completion, semantic cues and repetition. Pt demonstrated increase receptive language abilities demonstration function of all objects, matching word to objects in a field of three and required mod A multimodal cues to make wants/needs known. Pt demonstrated ability to read 2 /10 words correctly, however variety of cues were not effective during correction of errors.  Pt demonstrated spontaneous language with 2-3 word in phrase that are accurate, compared to 1-2 words accurate in structured task, picture description. SLP provided education of need for follow up services.  Pt was left in room with call bell within reach and bed alarm set. Recommend to continue skilled ST services.     Patient has met 2 of 2 long term goals.  Patient to discharge at overall Mod;Max level.  Reasons goals not met:     Clinical Impression/Discharge Summary:   Pt met 2 out 2 goals discharging at Mod A for cognitive ability and Max A verbal expression. Pt presents with Wernicke's aphasia,  moderate-severe expressive and receptive aphasia, receptive skills are stronger than expressive skills. Pt demonstrates ability to follow 1 step commands, basic problem solving with mod A cues, matching object to word, demonstrates functional use of object, however yes/no response remains inconsistent. Pt demonstrates  fluent speech containing phonemic paraphasis and neologisms with  little to no awareness of verbal errors. Pt demonstrates to express accurate speech during spontaneous speech, especially when appearing upset. Pt's progress was limited by poor frustration tolerance, refusal to participate and short length of stay. Pt would continue to benefit from skilled ST services to maximize functional independence and reduce burden of care requiring 24 hours supervision and outpatient skilled services.  Care Partner:  Caregiver Able to Provide Assistance: Yes  Type of Caregiver Assistance: Physical;Cognitive  Recommendation:  Outpatient SLP;24 hour supervision/assistance  Rationale for SLP Follow Up: Maximize functional communication;Maximize cognitive function and independence;Reduce caregiver burden   Equipment: N/A   Reasons for discharge: Discharged from hospital   Patient/Family Agrees with Progress Made and Goals Achieved: Yes    [employer]  [facility] 10/11/2018, 12:10 PM

## 2018-10-10 NOTE — Progress Notes (Signed)
Physical Therapy Session Note  Patient Details  Name: [name] MRN: [id number] Date of Birth: [birth year]  Today's Date: 10/10/2018 PT Individual Time: [id number] PT Individual Time Calculation (min): 10 min   Short Term Goals: Week 1:  PT Short Term Goal 1 (Week 1): Pt will ambulate 50 ft with LRAD and min assist PT Short Term Goal 2 (Week 1): Pt will perform bed<>chair transfers with min assist PT Short Term Goal 3 (Week 1): Pt will perform bed mobility with CGA  Skilled Therapeutic Interventions/Progress Updates:  Pt received in bed & agreeable to tx. No c/o pain reported. Pt transfers supine<>sitting EOB with supervision and sit<>stand with supervision. Pt ambulates 50 ft + 50 ft with rollator and min assist but max<>total assist for management of rollator brakes. Pt appeared to try to communicate that she was dizzy. Back in bed in room BP = 118/59 mmHg (LUE), HR = 92 bpm. Pt declining further participation & left in bed with alarm set & call bell in reach. RN made aware of pt's complaints & BP.  No family present for session.   Therapy Documentation Precautions:  Precautions Precautions: Fall, Other (comment) Precaution Comments: chronic back pain for which she takes daily narcotics Restrictions Weight Bearing Restrictions: No General: PT Amount of Missed Time (min): 50 Minutes PT Missed Treatment Reason: Patient unwilling to participate    Therapy/Group: Individual Therapy  Sandi MariscalVictoria M Leeon Makar 10/10/2018, 1:34 PM

## 2018-10-11 ENCOUNTER — Inpatient Hospital Stay (HOSPITAL_COMMUNITY): Payer: Medicare Other | Admitting: Physical Therapy

## 2018-10-11 ENCOUNTER — Inpatient Hospital Stay (HOSPITAL_COMMUNITY): Payer: Medicare Other | Admitting: Occupational Therapy

## 2018-10-11 ENCOUNTER — Inpatient Hospital Stay (HOSPITAL_COMMUNITY): Payer: Medicare Other

## 2018-10-11 DIAGNOSIS — R4701 Aphasia: Secondary | ICD-10-CM

## 2018-10-11 DIAGNOSIS — I482 Chronic atrial fibrillation, unspecified: Secondary | ICD-10-CM

## 2018-10-11 MED ORDER — NADOLOL 20 MG PO TABS: 10.0000 mg | ORAL_TABLET | Freq: Every day | ORAL | 1 refills | Status: DC

## 2018-10-11 MED ORDER — ATORVASTATIN CALCIUM 20 MG PO TABS: 10.0000 mg | ORAL_TABLET | Freq: Every day | ORAL | 0 refills | Status: DC

## 2018-10-11 MED ORDER — AMLODIPINE BESYLATE 2.5 MG PO TABS: 2.5000 mg | ORAL_TABLET | Freq: Every day | ORAL | 1 refills | Status: DC

## 2018-10-11 MED ORDER — LEVOTHYROXINE SODIUM 25 MCG PO TABS: 25.0000 ug | ORAL_TABLET | Freq: Every day | ORAL | 0 refills | Status: AC

## 2018-10-11 MED ORDER — APIXABAN 2.5 MG PO TABS: 2.5000 mg | ORAL_TABLET | Freq: Two times a day (BID) | ORAL | 1 refills | Status: DC

## 2018-10-11 NOTE — Progress Notes (Signed)
Occupational Therapy Session Note  Patient Details  Name: [name] MRN: [id number] Date of Birth: 1930/01/20  Today's Date: 10/11/2018 OT Individual Time: [id number] OT Individual Time Calculation (min): 55 min    Short Term Goals: Week 1:  OT Short Term Goal 1 (Week 1): STG = LTGs due to ELOS  Skilled Therapeutic Interventions/Progress Updates:    Treatment session with focus on functional mobility, bathroom transfers, and self-care tasks.  Pt received supine in bed agreeable to participating in therapy session.  Pt ambulated 125' with RW with CGA to Dayroom.  Pt with improving awareness of obstacles in Rt visual field.  Engaged in cognitive task with pt identifying errors in pictures.  Pt able to communicate errors with pointing and verbalizations, however "word salad" in nature.  Therapist transported pt to ADL apt to complete tub/shower transfers, pt adamantly refusing tub transfer stating that she would be using her large shower "as I did before".  Engaged in peg board activity with focus on visual scanning and attention to replicate pattern with pt able to complete with min cues as pt attempting to place pegs on top of picture and not in peg board.  Pt requested to return to room.  Upon transfer back to bed, pt reports need to toilet.  Ambulated to bathroom with RW with CGA.  Pt completed transfer and toileting with close supervision/CGA with setup for wash cloth for hygiene.  Pt returned to bed and left in supine with all needs in reach.  Pt's husband arrived while pt in bathroom.  Discussed current level and goals with pt meeting and even surpassing goals.  Pt's husband pleased with progress with ambulation and ability to complete self-care tasks.  Pt's husband hoping for more improvement in communication - agreeable to outpatient therapies to continue to address impairments.  Therapy Documentation Precautions:  Precautions Precautions: Fall, Other (comment) Precaution  Comments: chronic back pain for which she takes daily narcotics Restrictions Weight Bearing Restrictions: No Pain: Pain Assessment Pain Score: 0-No pain   Therapy/Group: Individual Therapy  Rosalio LoudHOXIE, Ayianna Darnold 10/11/2018, 12:59 PM

## 2018-10-11 NOTE — Discharge Summary (Signed)
NAME: WHITE [name], Farah W. MEDICAL RECORD [id number] ACCOUNT [id number] DATE OF BIRTH:[birth year] FACILITY: [facility] LOCATION: [facility] PHYSICIAN:ANDREW Wynn BankerKIRSTEINS, MD  DISCHARGE SUMMARY  DATE OF DISCHARGE:  10/12/2018  ADMIT DATE:  10/06/2018  DISCHARGE DATE:  10/12/2018  DISCHARGE DIAGNOSES: 1.  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 2.  Deep venous thrombosis prophylaxis with Eliquis, hypertension, atrial fibrillation, hyperlipidemia, hypothyroidism, diet-controlled diabetes mellitus.  HOSPITAL COURSE:  This is an 84 year old right-handed female with history of hypertension, diet-controlled diabetes, CVA 10 years ago, chronic anticoagulation secondary to atrial fibrillation.  Lives with her spouse.   Used a cane prior to admission.   Presented 10/03/2018 with sudden onset of right-sided weakness and aphasia.  INR on admission of 2.52.  Cranial CT scan unremarkable for acute process.  CT angiogram of head and neck showed left M2 branch occlusion.  MRI infarction, left MCA territory.   No hemorrhage.  Interventional radiology attempted revascularization unsuccessful.  Echocardiogram with ejection fraction of 60%.  Systolic function normal.  Plan per neurology service was to begin Eliquis in place of Coumadin once INR less than 2.00.   Tolerating a regular diet.  The patient was admitted for comprehensive rehabilitation program.  PAST MEDICAL HISTORY:  See discharge diagnoses.  SOCIAL HISTORY:  Lives with spouse.  Used a cane prior to admission.  FUNCTIONAL STATUS:  Upon admission to rehab services was moderate assist sit to stand, moderate assist to ambulate 3 feet, mod/max assist with ADLs.  PHYSICAL EXAMINATION: VITAL SIGNS:  Blood pressure 107/73, pulse 107, temperature 98, respirations 18. GENERAL:  Alert female.  Globally aphasic. HEENT:  EOMs intact. NECK:  Supple, nontender, no  JVD. CARDIOVASCULAR:  Rate controlled. ABDOMEN:  Soft, nontender, good bowel sounds. LUNGS:  Clear to auscultation without wheeze.  REHABILITATION HOSPITAL COURSE:  The patient was admitted to inpatient rehabilitation services.  Therapies initiated on a 3-hour daily basis, consisting of physical therapy, occupational therapy, speech therapy and rehabilitation nursing.  The following  issues were addressed.  Pertaining to the patient's left artery infarction, she would remain on Eliquis.  Attempts at revascularization of left M2 occlusion unsuccessful.  No bleeding episodes.  Blood pressure is controlled with Norvasc as well as  Corgard.  Atrial fibrillation.  Cardiac rate and monitored.  No chest pain or shortness of breath.  She remained on Lipitor for hyperlipidemia.  Hypothyroidism on Synthroid.  Blood sugars controlled.  Hemoglobin A1c 5.2.  The patient received weekly  collaborative interdisciplinary team conferences.  She was ambulating 50 feet, rolling walker, min assist.  Working with energy conservation techniques.  Transfer supine to sitting edge of bed supervision.  Gathers belongings with cueing for ADLs.   Completed toileting, lower body dressing, contact guard assist.  Full family teaching was completed and planned discharge to home.  DISCHARGE MEDICATIONS:  Included Norvasc 2.5 mg p.o. daily, Eliquis 2.5 mg p.o. b.i.d., Lipitor 10 mg daily, Synthroid 25 mcg p.o. daily, Corgard 10 mg p.o. daily, Tylenol as needed.  DIET:  Regular consistency.    FOLLOWUP:  She would follow up with Dr. Claudette LawsAndrew Kirsteins at the outpatient rehab center as advised; [facility], call for appointment; Dr. Wylene Simmerisovec, medical management; Chilton Siiffany Laird, cardiology services.  TN/NUANCE D:10/11/2018 T:10/11/2018 JOB:[id number]

## 2018-10-11 NOTE — Progress Notes (Signed)
[facility] PHYSICAL MEDICINE & REHABILITATION PROGRESS NOTE   Subjective/Complaints:  fluently aphasic with poor error awareness  ROS- unable to obtain due to aphasia Objective:   No results found. No results for input(s): WBC, HGB, HCT, PLT in the last 72 hours. No results for input(s): NA, K, CL, CO2, GLUCOSE, BUN, CREATININE, CALCIUM in the last 72 hours.  Intake/Output Summary (Last 24 hours) at 10/11/2018 0734 Last data filed at 10/10/2018 1700 Gross per 24 hour  Intake 640 ml  Output -  Net 640 ml     Physical Exam: Vital Signs Blood pressure 116/60, pulse 60, temperature 98.2 F (36.8 C), temperature source Oral, resp. rate 18, height _0  (1.702 m), weight 54.1 kg, SpO2 99 %.   General: No acute distress Mood and affect are appropriate Heart: irregularly irregular rate and rhythm no rubs murmurs or extra sounds Lungs: Clear to auscultation, breathing unlabored, no rales or wheezes Abdomen: Positive bowel sounds, soft nontender to palpation, nondistended Extremities: No clubbing, cyanosis, or edema Skin: No evidence of breakdown, no evidence of rash Neurologic: Cranial nerves II through XII intact, motor strength is 5/5 in bilateral deltoid, bicep, tricep, grip, hip flexor, knee extensors, ankle dorsiflexor and plantar flexor Sensory exam normal sensation to light touch and proprioception in bilateral upper and lower extremities Cerebellar exam normal finger to nose to finger as well as heel to shin in bilateral upper and lower extremities Musculoskeletal: Full range of motion in all 4 extremities. No joint swelling   Assessment/Plan: 1. Functional deficits secondary to Left post branch MCA infarct which require 3+ hours per day of interdisciplinary therapy in a comprehensive inpatient rehab setting.  Physiatrist is providing close team supervision and 24 hour management of active medical problems listed below.  Physiatrist and rehab team continue to assess  barriers to discharge/monitor patient progress toward functional and medical goals  Care Tool:  Bathing    Body parts bathed by patient: Right arm, Left arm, Abdomen, Chest, Front perineal area, Buttocks, Right upper leg, Left upper leg, Right lower leg, Left lower leg, Face         Bathing assist Assist Level: Contact Guard/Touching assist     Upper Body Dressing/Undressing Upper body dressing   What is the patient wearing?: Bra, Pull over shirt    Upper body assist Assist Level: Minimal Assistance - Patient > 75%(only to fasten bra)    Lower Body Dressing/Undressing Lower body dressing      What is the patient wearing?: Underwear/pull up, Pants     Lower body assist Assist for lower body dressing: Contact Guard/Touching assist     Toileting Toileting    Toileting assist Assist for toileting: Contact Guard/Touching assist     Transfers Chair/bed transfer  Transfers assist     Chair/bed transfer assist level: Contact Guard/Touching assist     Locomotion Ambulation   Ambulation assist      Assist level: Minimal Assistance - Patient > 75% Assistive device: (rollator) Max distance: 50 ft    Walk 10 feet activity   Assist     Assist level: Minimal Assistance - Patient > 75% Assistive device: (rollator)   Walk 50 feet activity   Assist Walk 50 feet with 2 turns activity did not occur: Safety/medical concerns  Assist level: Minimal Assistance - Patient > 75% Assistive device: (rollator)    Walk 150 feet activity   Assist Walk 150 feet activity did not occur: Safety/medical concerns  Walk 10 feet on uneven surface  activity   Assist Walk 10 feet on uneven surfaces activity did not occur: Safety/medical concerns         Wheelchair     Assist Will patient use wheelchair at discharge?: Yes Type of Wheelchair: Manual Wheelchair activity did not occur: Refused         Wheelchair 50 feet with 2 turns  activity    Assist    Wheelchair 50 feet with 2 turns activity did not occur: Refused       Wheelchair 150 feet activity     Assist Wheelchair 150 feet activity did not occur: Refused        Medical Problem List and Plan: 1.Right side weakness and aphasiasecondary to left MCA infarction due to left M2 occlusion status post IR attempts at revascularization unsuccessful Team conference today please see physician documentation under team conference tab, met with team face-to-face to discuss problems,progress, and goals. Formulized individual treatment plan based on medical history, underlying problem and comorbidities. 2. DVT Prophylaxis/Anticoagulation:on Eliquis 2.5 mg BID, D/C daily XTG6. Pain Management/chronic back pain:Patient on Percocet prior to admission. Will monitor with increased mobility and resume as needed versus low-dose tramadol 4. Mood:Provide emotional support 5. Neuropsych: This patientiscapable of making decisions on herown behalf. 6. Skin/Wound Care:Routine skin checks 7. Fluids/Electrolytes/Nutrition:Routine ins and outs with follow-up chemistries 8. Hypertension. Norvasc 2.5 mg daily, Corgard 10 mg daily. Monitor with increased mobility Vitals:   10/10/18 1906 10/11/18 0504  BP: (!) 147/73 116/60  Pulse: 93 60  Resp: 15 18  Temp: 98 F (36.7 C) 98.2 F (36.8 C)  SpO2: 97% 99%  BP controlled 12/24 9. Atrial fibrillation. Cardiac rate controlled 12/24. Followed by Dr. Oval Linsey with cardiology services 10. Hyperlipidemia. Lipitor 11. Hypothyroidism. Synthroid 12. Diet controlled diabetes mellitus. Hemoglobin A1c 5.2.    LOS: 5 days A FACE TO FACE EVALUATION WAS PERFORMED  Charlett Blake 10/11/2018, 7:34 AM

## 2018-10-11 NOTE — Progress Notes (Signed)
Occupational Therapy Discharge Summary  Patient Details  Name: Brion Hedges MRN: [id number] Date of Birth: 05/23/1930  Patient has met 7 of 8 long term goals due to improved activity tolerance, improved balance, ability to compensate for deficits and improved attention.  Patient to discharge at [facility] Assist level.  Patient's care partner is independent to provide the necessary physical and cognitive assistance at discharge.    Reasons goals not met: Pt did not meet shower transfers as each time the option to bathe at shower level or even complete simulated shower transfers pt refused.  Recommendation:  Patient will benefit from ongoing skilled OT services in outpatient setting to continue to advance functional skills in the area of BADL and Reduce care partner burden.  Equipment: 3 in 1  Reasons for discharge: treatment goals met and discharge from hospital  Patient/family agrees with progress made and goals achieved: Yes  OT Discharge Precautions/Restrictions  Precautions Precautions: Fall Restrictions Weight Bearing Restrictions: No General PT Missed Treatment Reason: Patient unwilling to participate Vital Signs Therapy Vitals Temp: 98.1 F (36.7 C) Temp Source: Oral Pulse Rate: 60 Resp: 18 BP: (!) 108/44 Patient Position (if appropriate): Lying Oxygen Therapy SpO2: 98 % O2 Device: Room Air Pain Pain Assessment Pain Scale: 0-10 Pain Score: 0-No pain ADL ADL Grooming: Setup Where Assessed-Grooming: Standing at sink Upper Body Bathing: Setup Where Assessed-Upper Body Bathing: Sitting at sink Lower Body Bathing: Contact guard Where Assessed-Lower Body Bathing: Sitting at sink, Standing at sink Upper Body Dressing: Minimal assistance Where Assessed-Upper Body Dressing: Sitting at sink Lower Body Dressing: Contact guard Where Assessed-Lower Body Dressing: Sitting at sink, Standing at sink Toileting: Setup, Contact guard Where Assessed-Toileting:  Glass blower/designer: Therapist, music Method: Counselling psychologist: Grab bars Vision Baseline Vision/History: Wears glasses Wears Glasses: Reading only Patient Visual Report: No change from baseline Vision Assessment?: Vision impaired- to be further tested in functional context(unable to formally assess 2/2 cognition) Additional Comments: difficult to assess due to aphasia.  Suspect Rt inattention as pt occasionally bumps in to items in Rt visual field Perception  Perception: Impaired Praxis Praxis: Impaired Praxis Impairment Details: Motor planning Cognition Overall Cognitive Status: Difficult to assess Arousal/Alertness: Awake/alert Orientation Level: Oriented to person Attention: Selective Sustained Attention: Appears intact Selective Attention: Impaired Awareness: Impaired Problem Solving: Impaired Safety/Judgment: Impaired Comments: decreased awareness of deficits Sensation Sensation Light Touch: Appears Intact Proprioception: Appears Intact Additional Comments: sensation appears intact, unable to formally assess secondary to aphasia Coordination Gross Motor Movements are Fluid and Coordinated: No Fine Motor Movements are Fluid and Coordinated: No Motor  Motor Motor: (unable to formally assess 2/2 refusal to participate in session) Mobility  Bed Mobility Bed Mobility: Rolling Right;Rolling Left;Supine to Sit;Sit to Supine Rolling Right: Supervision/verbal cueing Rolling Left: Supervision/Verbal cueing Supine to Sit: Supervision/Verbal cueing Sit to Supine: Supervision/Verbal cueing Transfers Sit to Stand: Supervision/Verbal cueing Stand to Sit: Supervision/Verbal cueing  Trunk/Postural Assessment  Cervical Assessment Cervical Assessment: Within Functional Limits Thoracic Assessment Thoracic Assessment: Within Functional Limits Lumbar Assessment Lumbar Assessment: Within Functional Limits Postural Control Postural Control:  Deficits on evaluation Protective Responses: delayed  Balance Balance Balance Assessed: Yes Static Sitting Balance Static Sitting - Balance Support: No upper extremity supported;Feet supported Static Sitting - Level of Assistance: 5: Stand by assistance Dynamic Sitting Balance Dynamic Sitting - Level of Assistance: 5: Stand by assistance Static Standing Balance Static Standing - Balance Support: No upper extremity supported;During functional activity Static Standing - Level of Assistance: 5:  Stand by assistance Dynamic Standing Balance Dynamic Standing - Balance Support: No upper extremity supported;During functional activity Dynamic Standing - Level of Assistance: 4: Min assist Extremity/Trunk Assessment RUE Assessment RUE Assessment: Within Functional Limits General Strength Comments: mild hemiplegia, however able to utilize during functional tasks LUE Assessment LUE Assessment: Within Functional Limits   Shaela Boer, [facility] 10/11/2018, 3:14 PM

## 2018-10-11 NOTE — Progress Notes (Signed)
Social Work  Discharge Note  The overall goal for the admission was met for:   Discharge location: Yes-HOME WITH HUSBAND WHO CAN PROVIDE 24 HR SUPERVISION  Length of Stay: Yes-6 DAYS  Discharge activity level: Yes-SUPERVISION LEVEL  Home/community participation: Yes  Services provided included: MD, RD, PT, OT, SLP, RN, CM, Pharmacy and [place]: Medicare and Private Insurance: [place]  Follow-up services arranged: Outpatient: [facility], OT,SP-WILL CALL HUSBAND TO ARRANGE APPOINTMENTS, DME: [place] 3 IN1 and Patient/Family has no preference for HH/DME agencies  Comments (or additional information):HUSBAND WAS IN FOR EDUCATION AND BOTH COMFORTABLE WITH HER CARE. AWARE HER SPEECH WILL TAKE LONGER TO COME BACK  Patient/Family verbalized understanding of follow-up arrangements: Yes  Individual responsible for coordination of the follow-up plan: HUSBAND  Confirmed correct DME delivered: Elease Hashimoto 10/11/2018    Elease Hashimoto

## 2018-10-11 NOTE — Discharge Summary (Signed)
Discharge summary job [phone]

## 2018-10-11 NOTE — Progress Notes (Signed)
Social Work Patient ID: [name], female   DOB: [birth year], 84 y.o.   MRN: [id number] Met with pt, husband and called daughter to inform of team conference goals supervision level and target discharge 12/25. All very pleased with this plan and pt is actually doing better with her mobility than she was prior to CVA. Discussed OP therapies and equipment needs. Husband has gone through family education will order follow up and DME for tomorrow.

## 2018-10-11 NOTE — Patient Care Conference (Signed)
Inpatient RehabilitationTeam Conference and Plan of Care Update Date: 10/11/2018   Time: 10:45 AM    Patient Name: [name]      Medical Record Number: [id number]  Date of Birth: 09-13-1930 Sex: Female         Room/Bed: 4W03C/4W03C-01 Payor Info: Payor: MEDICARE / Plan: MEDICARE PART A AND B / Product Type: *No Product type* /    Admitting Diagnosis: CVA  Admit Date/Time:  10/06/2018  2:03 PM Admission Comments: No comment available   Primary Diagnosis:  <principal problem not specified> Principal Problem: <principal problem not specified>  Patient Active Problem List   Diagnosis Date Noted  . Left middle cerebral artery stroke ([place]) 10/06/2018  . Dyslipidemia   . Diabetes mellitus type 2 in nonobese (HCC)   . Atrial fibrillation ([place])   . Tachypnea   . Stage 3 chronic kidney disease ([place])   . Stroke [facility] ischemic embolic L MCA  d/t AF 21/62/4469  . 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 Frequent falls 10/13/2017  . Vitamin D deficiency 04/22/2017  . Slurring of speech 03/31/2017  . Back pain 09/14/2016  . Compression fracture of thoracic vertebra (HCC) 09/14/2016  . S/P vertebroplasty 09/14/2016  . Chronic diastolic heart failure ([place]) 09/14/2016  . HTN (hypertension) 09/14/2016  . History of CVA (cerebrovascular accident) 09/14/2016  . Hypokalemia 09/11/2016  . Paraspinal hematoma 09/11/2016  . Osteoporosis 05/27/2016  . Hypothyroidism 09/11/2014  . Encounter for therapeutic drug monitoring 11/28/2013  . Persistent atrial fibrillation ([place]) 12/15/2012  . HLD (hyperlipidemia) 03/27/2011    Expected Discharge Date: Expected Discharge Date: 10/12/18  Team Members Present: Physician leading conference: Dr. Alysia Penna Social Worker Present: Ovidio Kin, LCSW Nurse Present: Dorien Chihuahua, RN PT Present: Leavy Cella, PT OT Present: Simonne Come, OT SLP Present: Charolett Bumpers, SLP PPS Coordinator present : Daiva Nakayama, RN, CRRN   Current Status/Progress Goal Weekly Team Focus  Medical   pt BP improving, intake reduce, severe aphasia  maintain med stability , improve fall risk, improve communication  intake, receptive language skills   Bowel/Bladder   continent of bowel and bladder,LBM 10-10-18  maintain regular bowel pattern, remain continent of bowel and bladder  Assis with tolieting needs prn    Swallow/Nutrition/ Hydration             ADL's   CGA bathing, LB dressing, toileting; Min assist toilet transfers and to don bra  Min assist bathing/dressing, Min assist transfers  transfers, dynamic standing balance, Rt attention, pt/family education, d/c planning   Mobility   min assist with rollator except max<>total assist to manage brakes, supervision sit<>stand and supine<>sit  CGA gait, min assist 2 steps without rails for home access, supervision w/c mobility, CGA transfers  gait, transfers, bed mobility, d/c planning, pt/family education, stairs as able   Communication   Max A   Max A - goal met      Safety/Cognition/ Behavioral Observations  Max-Mod A  Max A - goal met       Pain   no c/o pain   no pain  Assess skin q shift and prn   Skin   bruising to right groin   no new skin issues  Assess skin q shift and prn      *See Care Plan and progress notes for long and short-term goals.     Barriers to Discharge  Current Status/Progress Possible Resolutions Date Resolved   Physician    Medical stability  progressing toward goals  cont rehab      Nursing                  PT  Inaccessible home environment;Behavior;Home environment access/layout  2 steps to enter home without rails, unsure if caregiver can provide necessary assistance upon d/c              OT                  SLP Other (comments) progress/prognosis will be limited by pt's baseline manipulative/controlling and arguementive behaviors (per family report)            SW                Discharge Planning/Teaching Needs:  Home with  husband who can physically assist and was prior to admission and involved daughter. Aware pt will require 24 hr care.      Team Discussion:  Mobility doing well and at [facility]. Speech making progress but will take longer to recover per MD. Husband has been in for education and can provide 24 hr supervision level. Team recommends OP therapies.   Revisions to Treatment Plan:  DC 12/25    Continued Need for Acute Rehabilitation Level of Care: The patient requires daily medical management by a physician with specialized training in physical medicine and rehabilitation for the following conditions: Daily direction of a multidisciplinary physical rehabilitation program to ensure safe treatment while eliciting the highest outcome that is of practical value to the patient.: Yes Daily medical management of patient stability for increased activity during participation in an intensive rehabilitation regime.: Yes Daily analysis of laboratory values and/or radiology reports with any subsequent need for medication adjustment of medical intervention for : Neurological problems   I attest that I was present, lead the team conference, and concur with the assessment and plan of the team.   Elease Hashimoto 10/11/2018, 12:46 PM

## 2018-10-11 NOTE — Progress Notes (Addendum)
Physical Therapy Discharge Summary  Patient Details  Name: [name] MRN: [id number] Date of Birth: [birth year]  Today's Date: 10/11/2018 PT Individual Time: 1400-1415 PT Individual Time Calculation (min): 15 min   Pt in supine and initially refusing to participate 2/2 feeling ill from vomiting and diarrhea. Husband present and encouraging pt to participate. Pt agreeable to practice stairs and car transfer only, to make sure she is safe to d/c w/ husband. Husband described steps to enter home but refused to accompany pt to PT for family edu. Supervision bed mobility and transfer to w/c. Total assist w/c transport to/from therapy gym. Once in gym, pt refused to practice anything, stating "I only told you I would just look at it". Pt verbose and mostly speaking in fluent jargon. Pt w/ escalating agitation towards therapist, continued to refuse. Returned to room and husband was upset w/ pt not participating, pt continued to refuse despite his encouragement. Educated husband on using RW for curb and stair negotiation, husband verbalized understanding, he states "she's better than she was before the stroke, it will be ok". Ended session in supine, all needs in reach. Missed 45 min of skilled PT 2/2 refusal.  Patient has met 3 of 6 long term goals due to improved activity tolerance, improved balance, improved postural control, increased strength, ability to compensate for deficits and improved awareness.  Patient to discharge at an ambulatory level Min Assist (contact guard). Patient's care partner is independent to provide the necessary physical and cognitive assistance at discharge. Husband verbalizes understanding of need for 24/7 close supervision to [facility] for all mobility. Pt refused any teaching or education from this therapist and other therapists who have worked w/ her.   Reasons goals not met: Pt w/ limited participation over last few days. Per husband, she is better (functionally) than  PTA. She refused to practice stairs and car transfer. See note above about conversation w/ husband regarding readiness for d/c.   Recommendation:  Patient will benefit from ongoing skilled PT services in outpatient setting to continue to advance safe functional mobility, address ongoing impairments in functional balance, global strength and endurance, and cognition and minimize fall risk.  Equipment: youth RW  Reasons for discharge: treatment goals met and overall decreased participation   Patient/family agrees with progress made and goals achieved: Yes  PT Discharge Precautions/Restrictions Precautions Precautions: Fall Restrictions Weight Bearing Restrictions: No Pain Pain Assessment Pain Scale: 0-10 Pain Score: 0-No pain Vision/Perception  Perception Perception: Impaired Praxis Praxis: Impaired Praxis Impairment Details: Motor planning  Cognition Overall Cognitive Status: Difficult to assess Arousal/Alertness: Awake/alert Orientation Level: Oriented to person;Disoriented to place;Disoriented to time;Disoriented to situation Attention: Selective Sustained Attention: Appears intact Selective Attention: Impaired Awareness: Impaired Problem Solving: Impaired Safety/Judgment: Impaired Comments: decreased awareness of deficits Sensation Sensation Light Touch: Appears Intact Additional Comments: sensation appears intact, unable to formally assess secondary to aphasia Coordination Gross Motor Movements are Fluid and Coordinated: No Fine Motor Movements are Fluid and Coordinated: No Motor  Motor Motor: (unable to formally assess 2/2 refusal to participate in session)  Mobility Bed Mobility Bed Mobility: Rolling Right;Rolling Left;Supine to Sit;Sit to Supine Rolling Right: Supervision/verbal cueing Rolling Left: Supervision/Verbal cueing Supine to Sit: Supervision/Verbal cueing Sit to Supine: Supervision/Verbal cueing Transfers Transfers: Sit to Stand;Stand to  Sit;Stand Pivot Transfers Sit to Stand: Supervision/Verbal cueing Stand to Sit: Supervision/Verbal cueing Stand Pivot Transfers: Supervision/Verbal cueing Transfer (Assistive device): None Locomotion  Gait Ambulation: Yes Gait Assistance: Contact Guard/Touching assist Gait Distance (Feet): 120 Feet Assistive  device: Environmental consultant / Additional Locomotion Stairs Assistance: Minimal Assistance - Patient > 75% Stair Management Technique: Two rails Height of Stairs: 6 Wheelchair Mobility Wheelchair Mobility: No  Trunk/Postural Assessment  Cervical Assessment Cervical Assessment: Within Functional Limits Thoracic Assessment Thoracic Assessment: Within Functional Limits Lumbar Assessment Lumbar Assessment: Within Functional Limits Postural Control Postural Control: Deficits on evaluation Protective Responses: delayed  Balance Balance Balance Assessed: Yes Static Sitting Balance Static Sitting - Balance Support: No upper extremity supported;Feet supported Static Sitting - Level of Assistance: 5: Stand by assistance Dynamic Sitting Balance Dynamic Sitting - Level of Assistance: 5: Stand by assistance Static Standing Balance Static Standing - Balance Support: No upper extremity supported;During functional activity Static Standing - Level of Assistance: 5: Stand by assistance Dynamic Standing Balance Dynamic Standing - Balance Support: No upper extremity supported;During functional activity Dynamic Standing - Level of Assistance: 4: Min assist Extremity Assessment  RLE Assessment RLE Assessment: (unable to formally assess 2/2 aphasia and refusal to participate in session) LLE Assessment LLE Assessment: Exceptions to WFL(unable to formally assess 2/2 aphasia and refusal to participate in session)    Codee Tutson K Eniyah Eastmond 10/11/2018, 2:45 PM

## 2018-10-12 NOTE — Progress Notes (Signed)
Patient and spouse received discharge instructions from Dan Angiulli, PA-C with verbal understanding. Patient discharged to home with spouse and patient belongings. 

## 2018-10-13 ENCOUNTER — Inpatient Hospital Stay (HOSPITAL_COMMUNITY): Payer: Medicare Other | Admitting: Physical Therapy

## 2018-10-13 ENCOUNTER — Encounter: Payer: Self-pay | Admitting: Physical Medicine & Rehabilitation

## 2018-10-24 ENCOUNTER — Telehealth: Payer: Self-pay | Admitting: Cardiovascular Disease

## 2018-10-24 NOTE — Telephone Encounter (Signed)
Attempted to reach patient's husband. Phone did not ring, went straight to VM. No VM set up

## 2018-10-24 NOTE — Telephone Encounter (Signed)
New Message:       Pt 's husband called and said pt have had a stroke. He says he needs to talk to a nurse about her medicine.

## 2018-10-26 ENCOUNTER — Encounter: Payer: Self-pay | Admitting: Physical Therapy

## 2018-10-26 ENCOUNTER — Other Ambulatory Visit: Payer: Self-pay

## 2018-10-26 ENCOUNTER — Ambulatory Visit: Payer: Medicare Other | Admitting: Endocrinology

## 2018-10-26 ENCOUNTER — Ambulatory Visit: Payer: Medicare Other | Attending: Physical Medicine & Rehabilitation | Admitting: Physical Therapy

## 2018-10-26 DIAGNOSIS — R293 Abnormal posture: Secondary | ICD-10-CM | POA: Diagnosis not present

## 2018-10-26 DIAGNOSIS — R41841 Cognitive communication deficit: Secondary | ICD-10-CM | POA: Insufficient documentation

## 2018-10-26 DIAGNOSIS — R4701 Aphasia: Secondary | ICD-10-CM | POA: Insufficient documentation

## 2018-10-26 DIAGNOSIS — R2681 Unsteadiness on feet: Secondary | ICD-10-CM | POA: Diagnosis not present

## 2018-10-26 DIAGNOSIS — R482 Apraxia: Secondary | ICD-10-CM | POA: Insufficient documentation

## 2018-10-26 DIAGNOSIS — Z9181 History of falling: Secondary | ICD-10-CM | POA: Insufficient documentation

## 2018-10-26 DIAGNOSIS — I69315 Cognitive social or emotional deficit following cerebral infarction: Secondary | ICD-10-CM | POA: Insufficient documentation

## 2018-10-26 DIAGNOSIS — R2689 Other abnormalities of gait and mobility: Secondary | ICD-10-CM | POA: Diagnosis not present

## 2018-10-26 DIAGNOSIS — M6281 Muscle weakness (generalized): Secondary | ICD-10-CM | POA: Insufficient documentation

## 2018-10-26 DIAGNOSIS — I69351 Hemiplegia and hemiparesis following cerebral infarction affecting right dominant side: Secondary | ICD-10-CM | POA: Insufficient documentation

## 2018-10-27 ENCOUNTER — Other Ambulatory Visit: Payer: Self-pay

## 2018-10-27 ENCOUNTER — Ambulatory Visit: Payer: Medicare Other | Admitting: Speech Pathology

## 2018-10-27 ENCOUNTER — Ambulatory Visit: Payer: Medicare Other | Admitting: Occupational Therapy

## 2018-10-27 ENCOUNTER — Encounter: Payer: Self-pay | Admitting: Occupational Therapy

## 2018-10-27 DIAGNOSIS — I69351 Hemiplegia and hemiparesis following cerebral infarction affecting right dominant side: Secondary | ICD-10-CM

## 2018-10-27 DIAGNOSIS — R293 Abnormal posture: Secondary | ICD-10-CM | POA: Diagnosis not present

## 2018-10-27 DIAGNOSIS — R2681 Unsteadiness on feet: Secondary | ICD-10-CM | POA: Diagnosis not present

## 2018-10-27 DIAGNOSIS — R41841 Cognitive communication deficit: Secondary | ICD-10-CM

## 2018-10-27 DIAGNOSIS — I69315 Cognitive social or emotional deficit following cerebral infarction: Secondary | ICD-10-CM

## 2018-10-27 DIAGNOSIS — M6281 Muscle weakness (generalized): Secondary | ICD-10-CM

## 2018-10-27 DIAGNOSIS — R4701 Aphasia: Secondary | ICD-10-CM

## 2018-10-27 DIAGNOSIS — R2689 Other abnormalities of gait and mobility: Secondary | ICD-10-CM | POA: Diagnosis not present

## 2018-10-27 DIAGNOSIS — R482 Apraxia: Secondary | ICD-10-CM

## 2018-10-27 DIAGNOSIS — Z9181 History of falling: Secondary | ICD-10-CM | POA: Diagnosis not present

## 2018-10-27 NOTE — Patient Instructions (Signed)
Tips for Talking with People who have Aphasia  . Say one thing at a time . Don't  rush - slow down, be patient . Talk face to face . Reduce background noise . Relax - be natural . Use pen and paper . Write down key words . Draw diagrams or pictures . Don't pretend you understand . Ask what helps . Recap - check you both understand . Be a partner, not a therapist   Aphasia does not affect intelligence, only language. The person with aphasia can still: make decisions, have opinions, and socialize.   Describing words  What group does it belong to?  What do I use it for?  Where can I find it?  What does it LOOK like?  What other words go with it?  What is the 1st sound of the word?   Many Ways to Communicate  Describe it Write it Draw it Gesture it Use related words   

## 2018-10-27 NOTE — Therapy (Signed)
[facility] [address] [place], [place], [address] Phone: [phone]   Fax:  [phone]  Occupational Therapy Evaluation  Patient Details  Name: [name] MRN: [id number] Date of Birth: [birth year] No data recorded  Encounter Date: 10/27/2018  OT End of Session - 10/27/18 1719    Visit Number  1    Number of Visits  25    Date for OT Re-Evaluation  01/25/19    Authorization Type  Medicare and AARP supplement    Authorization - Visit Number  1    Authorization - Number of Visits  10   PN every 10th visit   OT Start Time  0017    OT Stop Time  1445    OT Time Calculation (min)  42 min    Behavior During Therapy  [facility] for tasks assessed/performed       Past Medical History:  Diagnosis Date  . Atrial fibrillation ([place])   . Chronic anticoagulation   . Chronic anticoagulation   . CVA (cerebrovascular accident) ([place])   . Diabetes mellitus   . History of chicken pox   . Hyperlipidemia   . Hypertension     Past Surgical History:  Procedure Laterality Date  . APPENDECTOMY    . BACK SURGERY    . CARDIAC CATHETERIZATION  11/09/91   EF 70%  . DILATION AND CURETTAGE OF UTERUS    . IR PERCUTANEOUS ART THROMBECTOMY/INFUSION INTRACRANIAL INC DIAG ANGIO  10/03/2018  . RADIOLOGY WITH ANESTHESIA N/A 10/03/2018   Procedure: IR WITH ANESTHESIA;  Surgeon: Radiologist, Medication, MD;  Location: [place];  Service: Radiology;  Laterality: N/A;    There were no vitals filed for this visit.  Subjective Assessment - 10/27/18 1413    Subjective   "The clothes" Patient aphasic - husband indicates her inability to talk is most frustrating for her.      Patient is accompained by:  Family member   Husband [name]   Currently in Pain?  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    Leisure  reading newspaper      ADL   Eating/Feeding  Minimal assistance    Grooming  Independent    Upper Body Bathing  Supervision/safety    Lower Body Bathing  Supervision/safety    Upper Body Dressing  Minimal assistance    Lower Body Dressing  Moderate assistance    Toilet Transfer  Minimal assistance    Toileting - Clothing Manipulation  Minimal assistance    Toileting -  Hygiene  Moderate assistance    Hydrographic surveyor seat with back    Transfers/Ambulation Related to ADL's  walks with rolling walker       IADL   Prior Level of Function Shopping  Independent  Shopping  Completely unable to shop    Prior Level of Function Light Housekeeping  Independent    Light Housekeeping  Does not participate in any housekeeping tasks    Prior Level of Function Meal Prep  Independent    Meal Prep  Needs to have meals prepared and served    Prior Level of Function Medication Managment  Independent    Medication Management  Is not capable of dispensing or managing own medication    Prior Level of Function Financial Management  Independent    Financial Management  Dependent      Written Expression   Dominant Hand  Right    Handwriting  100% legible   patient aphasic     Vision - History   Baseline Vision  Wears glasses all the time      Vision Assessment   Eye Alignment  Within Functional Limits    Ocular Range of Motion  Within Functional Limits    Alignment/Gaze Preference  Within Defined Limits      Activity Tolerance   Activity Tolerance  Tolerates < 10 min activity with changes in vital signs       Cognition   Area of Impairment  Attention    Current Attention Level  Sustained    Attention Comments  Patient distracted by visual and auditory stimualtion in clinic    Following Commands  --   Patient with fluent aphasia   Cognition Comments  Patient attentive to clinician during evaluation - receptive and expressive aphasia and ?apraxia limit ability to follow verbal or gestural cues/directions      Posture/Postural Control   Posture/Postural Control  Postural limitations    Postural Limitations  Posterior pelvic tilt;Flexed trunk;Forward head;Rounded Shoulders    Posture Comments  sits slumped in chair, able to sit upright - does not sustain      Sensation   Light Touch  Appears Intact    Stereognosis  Not tested    Hot/Cold  Not tested    Proprioception  Impaired by gross assessment      Coordination   Gross Motor Movements are Fluid and Coordinated  No    Fine Motor Movements are Fluid and Coordinated  No    Finger Nose Finger Test  Unable to follow - ?apraxia/aphasia    9 Hole Peg Test  Right;Left    Right 9 Hole Peg Test  47.81    Left 9 Hole Peg Test  45.15      Perception   Perception  --   TBD     Praxis   Praxis  Impaired    Praxis Impairment Details  Motor planning      ROM / Strength   AROM / PROM / Strength  AROM;Strength      AROM   Overall AROM   Deficits    AROM Assessment Site  Shoulder    Right/Left Shoulder  Right    Right Shoulder Flexion  115 Degrees    Right Shoulder ABduction  115 Degrees      Strength   Overall Strength  Deficits    Overall Strength Comments  4-/5 LEFT SHOULDER      Hand Function   Right Hand Gross Grasp  Impaired    Right Hand Grip (lbs)  10    Right Hand Lateral Pinch  8 lbs    Left Hand Gross Grasp  Impaired    Left Hand Grip (lbs)  20    Left Hand Lateral Pinch    7 lbs                      OT Education - 10/27/18 1718    Education Details  OT eval results; plan of care, and potential goals     Person(s) Educated  Patient;Spouse   husband Ken   Methods  Explanation    Comprehension  Verbalized understanding;Need further instruction       OT Short Term Goals - 10/27/18 1731      OT SHORT TERM GOAL #1   Title  Patient will dress her upper body with modified independence due 11/26/18    Time  4    Period  Weeks    Status  New    Target Date  11/26/18      OT SHORT TERM GOAL #2   Title  Patient will complete toilet hygiene with min assist    Time  4    Period  Weeks    Status  New      OT SHORT TERM GOAL #3   Title  Patient will pull up and down clothing for toileting with modified independence    Time  4    Period  Weeks    Status  New      OT SHORT TERM GOAL #4   Title  Patient will use two hands to cut food on plate as needed    Time  4    Period  Weeks    Status  New      OT SHORT TERM GOAL #5   Title  Patient will don / doff LB clothing with no more than min assist    Time  4    Period  Weeks    Status  New        OT Long Term Goals - 10/27/18 1734      OT LONG TERM GOAL #1   Title  Patient will complete a home activities program designed to improve functional use of right UE DUE 01/25/19    Time  12    Period  Weeks    Status  New    Target Date  01/25/19      OT LONG TERM GOAL #2   Title  Patient will dress herself with modified independence    Time  12    Period  Weeks    Status  New      OT LONG TERM GOAL #3   Title  Patient will shower with modified independence    Time  4    Period  Weeks    Status  New      OT LONG TERM GOAL #4   Title  Patient will toilet with modified independence    Time  12    Period  Weeks    Status  New      OT LONG TERM GOAL #5   Title  Patient will rach into chest igh shelf to retrieve a lightweight object (~2lb)  with right hand    Time  12    Period  Weeks    Status  New      Long Term Additional Goals   Additional Long Term Goals  Yes      OT LONG TERM GOAL #6   Title  Patient will demonstrate  5 lb increase in grip strength in right hand    Baseline  10    Time  12    Period    Weeks    Status  New            Plan - 10/27/18 1722    Clinical Impression Statement  Patient is an 85 year old woman who on 12/16 suffered a L MCA CVA.  Her past medical history is significant for Afib, DM, HLD, HTN, hip fracture, vertebral fracture, and CVA X 10 years ago.  Patient was on inpatient rehab form 12/19-12/25 and is currently home with supportive husband.  Patient presents to OT evaluation with significant expressive and receptive aphasia, apraxia, right hemiparesis, decreased attention, and decreased activity tolerance.  Patient currently requires min to mod assist for basic self care skills,a nd was independent prior to this stroke.  Patient will benefit from skilled OT intervention to improve her independence with familiar functional tasks.      Occupational Profile and client history currently impacting functional performance  wife, retired banker    Occupational performance deficits (Please refer to evaluation for details):  ADL's;IADL's;Leisure;Social Participation    Rehab Potential  Good    OT Frequency  2x / week    OT Duration  12 weeks    OT Treatment/Interventions  Self-care/ADL training;DME and/or AE instruction;Balance training;Aquatic Therapy;Therapeutic activities;Therapeutic exercise;Cognitive remediation/compensation;Neuromuscular education;Functional Mobility Training;Visual/perceptual remediation/compensation;Patient/family education    Plan  ADL -tasks - dynamic stand balance, functional use of right UE, Education with patient/husband Ken - may need rote practice    Clinical Decision Making  Multiple treatment options, significant modification of task necessary    Consulted and Agree with Plan of Care  Patient;Family member/caregiver    Family Member Consulted  Ken - husband       Patient will benefit from skilled therapeutic intervention in order to improve the  following deficits and impairments:  Decreased coordination, Improper body mechanics, Decreased endurance, Decreased safety awareness, Decreased activity tolerance, Decreased knowledge of precautions, Decreased balance, Decreased knowledge of use of DME, Impaired UE functional use, Decreased cognition, Decreased mobility, Impaired vision/preception, Decreased strength, Impaired tone, Impaired sensation  Visit Diagnosis: Hemiplegia and hemiparesis following cerebral infarction affecting right dominant side (HCC) - Plan: Ot plan of care cert/re-cert  Cognitive social or emotional deficit following cerebral infarction - Plan: Ot plan of care cert/re-cert  Apraxia - Plan: Ot plan of care cert/re-cert  Muscle weakness (generalized) - Plan: Ot plan of care cert/re-cert  Abnormal posture - Plan: Ot plan of care cert/re-cert    Problem List Patient Active Problem List   Diagnosis Date Noted  . Left middle cerebral artery stroke (HCC) 10/06/2018  . Dyslipidemia   . Diabetes mellitus type 2 in nonobese (HCC)   . Atrial fibrillation (HCC)   . Tachypnea   . Stage 3 chronic kidney disease (HCC)   . Stroke (HCC) ischemic embolic L MCA  d/t AF 10/03/2018  . 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 Frequent falls 10/13/2017  . Vitamin D deficiency 04/22/2017  . Slurring of speech 03/31/2017  . Back pain 09/14/2016  . Compression fracture of thoracic vertebra (HCC) 09/14/2016  . S/P vertebroplasty 09/14/2016  . Chronic diastolic heart failure (HCC) 09/14/2016  . HTN (hypertension) 09/14/2016  . History of CVA (cerebrovascular accident) 09/14/2016  . Hypokalemia 09/11/2016  . Paraspinal hematoma 09/11/2016  . Osteoporosis 05/27/2016  . Hypothyroidism 09/11/2014  . Encounter for therapeutic drug monitoring 11/28/2013  . Persistent atrial fibrillation (HCC) 12/15/2012  . HLD (hyperlipidemia) 03/27/2011    Gellert, Kristin M, OTR/L 10/27/2018, 5:40 PM  [facility] [address]   [place], [place], [address] Phone: [phone]   Fax:  [phone]  Name: Irva Loser MRN: [id number] Date of Birth: 04/26/1930

## 2018-10-27 NOTE — Therapy (Signed)
[facility] [address] [place], [place], [address] Phone: [phone]   Fax:  [phone]  Physical Therapy Evaluation  Patient Details  Name: [name] MRN: [id number] Date of Birth: 03/18/1930 Referring Provider (PT): Claudette Laws, MD   Encounter Date: 10/26/2018  PT End of Session - 10/26/18 1407    Visit Number  1    Number of Visits  25    Date for PT Re-Evaluation  01/24/19    Authorization Type  Medicare & AARP  100% covered;  follow Medicare guidelines    PT Start Time  1315    PT Stop Time  1400    PT Time Calculation (min)  45 min    Equipment Utilized During Treatment  Gait belt    Activity Tolerance  Patient tolerated treatment well    Behavior During Therapy  Flat affect       Past Medical History:  Diagnosis Date  . Atrial fibrillation (HCC)   . Chronic anticoagulation   . Chronic anticoagulation   . CVA (cerebrovascular accident) (HCC)   . Diabetes mellitus   . History of chicken pox   . Hyperlipidemia   . Hypertension     Past Surgical History:  Procedure Laterality Date  . APPENDECTOMY    . BACK SURGERY    . CARDIAC CATHETERIZATION  11/09/91   EF 70%  . DILATION AND CURETTAGE OF UTERUS    . IR PERCUTANEOUS ART THROMBECTOMY/INFUSION INTRACRANIAL INC DIAG ANGIO  10/03/2018  . RADIOLOGY WITH ANESTHESIA N/A 10/03/2018   Procedure: IR WITH ANESTHESIA;  Surgeon: Radiologist, Medication, MD;  Location: [facility] OR;  Service: Radiology;  Laterality: N/A;    There were no vitals filed for this visit.   Subjective Assessment - 10/26/18 1319    Subjective  This 85yo female was referred to PT/OT/ST on 10/14/2018 with L CVA by Claudette Laws, MD. She was admitted 10/03/2018 with acute embolism L MCA with right-sided weakness & speech difficulty. Inpatient Rehab 10/06/2018-10/12/2018    Patient is accompained by:  Family member   husband, [name]   Pertinent History  HTN, DM, CVA  67yrs ago, A-Fib, chronic LBP    Limitations  Lifting;Standing;Walking;House hold activities    Patient Stated Goals  To talk better, to walk & balance better.    Currently in Pain?  Yes    Pain Score  2     Pain Location  Abdomen    Pain Orientation  Left;Mid    Pain Descriptors / Indicators  Sore    Pain Type  Acute pain    Pain Onset  Yesterday    Pain Frequency  Intermittent    Aggravating Factors   unknown    Pain Relieving Factors  unknown         [facility] PT Assessment - 10/26/18 1315      Assessment   Medical Diagnosis  LCVA    Referring Provider (PT)  Claudette Laws, MD    Onset Date/Surgical Date  10/03/18    Hand Dominance  Right    Prior Therapy  inpatient rehab      Precautions   Precautions  Fall    Precaution Comments  no driving      Balance Screen   Has the patient Martinez in the past 6 months  Yes    How many times?  1   lost balance and fractured right wrist & shoulder   Has the patient had a decrease in activity  level because of a fear of falling?   Yes    Is the patient reluctant to leave their home because of a fear of falling?   Yes      Home Environment   Living Environment  Private residence    Living Arrangements  Spouse/significant other    Type of Home  House    Home Access  Stairs to enter    Entrance Stairs-Number of Steps  2 + 1    Entrance Stairs-Rails  None    Home Layout  One level    Home Equipment  Walker - 2 wheels;Cane - single point;Bedside commode;Shower seat      Prior Function   Level of Independence  Independent;Independent with household mobility without device;Independent with community mobility without device    Vocation  Retired    Leisure  family functions, reading,       Cognition   Overall Cognitive Status  Impaired/Different from baseline    Area of Impairment  Following commands    Following Commands  Follows one step commands inconsistently;Follows one step commands with increased time    Following Command  Comments  follows simple one step commands when focused. Unable to follow when ambulating or during functional task.       ROM / Strength   AROM / PROM / Strength  AROM;Strength      AROM   Overall AROM   Within functional limits for tasks performed      Strength   Overall Strength  Deficits    Overall Strength Comments  LEs tested seated: RLE hip grossly 3/5, knee 4/5, ankle DF 4/5  LLE WFL      Transfers   Transfers  Sit to Stand;Stand to Sit    Sit to Stand  5: Supervision;With upper extremity assist;With armrests;From chair/3-in-1;Other (comment)   needs RW or external support touch to stabilize   Stand to Sit  5: Supervision;With upper extremity assist;With armrests;To chair/3-in-1      Ambulation/Gait   Ambulation/Gait  Yes    Ambulation/Gait Assistance  5: Supervision;3: Mod assist   supervision RW & modA no device   Ambulation/Gait Assistance Details  pt / husband report she leaves RW outside bathroom & holds counter / wall to enter/exit bathroom.  PT assessed gait without RW and requires hand hold assist.  PT also switched RW wheels to inside to decrease width for improved access to areas.     Ambulation Distance (Feet)  150 Feet   150' RW & 20' HHA / no device   Assistive device  Rolling walker;1 person hand held assist;None    Gait Pattern  Step-to pattern;Decreased arm swing - right;Decreased step length - left;Decreased stance time - right;Decreased weight shift to right;Right flexed knee in stance;Decreased hip/knee flexion - right;Antalgic;Lateral hip instability;Abducted- right    Ambulation Surface  Indoor;Level    Gait velocity  1.60 ft/sec with RW   <1.8 ft/sec indicates fall risk   Ramp  4: Min assist   RW   Curb  4: Min assist   RW     Standardized Balance Assessment   Standardized Balance Assessment  Berg Balance Test;Timed Up and Go Test      Berg Balance Test   Sit to Stand  Able to stand using hands after several tries    Standing Unsupported  Needs  several tries to stand 30 seconds unsupported    Sitting with Back Unsupported but Feet Supported on Floor or Stool  Able to  sit safely and securely 2 minutes    Stand to Sit  Controls descent by using hands    Transfers  Able to transfer safely, definite need of hands    Standing Unsupported with Eyes Closed  Able to stand 3 seconds    Standing Ubsupported with Feet Together  Able to place feet together independently but unable to hold for 30 seconds    From Standing, Reach Forward with Outstretched Arm  Reaches forward but needs supervision    From Standing Position, Pick up Object from Floor  Unable to pick up and needs supervision    From Standing Position, Turn to Look Behind Over each Shoulder  Needs supervision when turning    Turn 360 Degrees  Needs assistance while turning    Standing Unsupported, Alternately Place Feet on Step/Stool  Needs assistance to keep from falling or unable to try    Standing Unsupported, One Foot in [employer] balance while stepping or standing    Standing on One Leg  Unable to try or needs assist to prevent fall    Total Score  20      Timed Up and Go Test   Normal TUG (seconds)  24.06   hand hold moderate assist, time & assist indicate fall risk               Objective measurements completed on examination: See above findings.                PT Short Term Goals - 10/26/18 1449      PT SHORT TERM GOAL #1   Title  Patient's husband verbalizes fall risk prevention strategies. (All STGs Target Date: 11/25/2018)    Time  1    Period  Months    Status  New    Target Date  11/25/18      PT SHORT TERM GOAL #2   Title  patient ambulates 100' around furniture with RW with supervision.     Time  1    Period  Months    Status  New    Target Date  11/25/18      PT SHORT TERM GOAL #3   Title  Patient sit to/from stand from chairs with armrests without touching external support to stabilize with supervision.     Time  1    Period   Months    Status  New    Target Date  11/25/18      PT SHORT TERM GOAL #4   Title  Patient & husband demonstrate understanding of initial HEP.     Time  1    Period  Months    Status  New    Target Date  11/25/18        PT Long Term Goals - 10/27/18 1008      PT LONG TERM GOAL #1   Title  Patient & husband verbalize understanding of fall prevention strategies including appropriate assistive device. (All LTGs Target Date: 01/20/2019)    Time  12    Period  Weeks    Status  New    Target Date  01/20/19      PT LONG TERM GOAL #2   Title  Berg Balance >36/56 to indicate lower fall risk.     Time  12    Period  Weeks    Status  New    Target Date  01/20/19      PT LONG TERM GOAL #3  Title  Timed Up & Go with LRAD modified independent <13.5 seconds    Time  12    Period  Weeks    Status  New    Target Date  01/20/19      PT LONG TERM GOAL #4   Title  Patient ambulates 100' around furniture with LRAD with no balance losses with supervision for cognitive deficits only to enable safe mobility within her home without assistance.     Time  12    Period  Weeks    Status  New    Target Date  01/20/19      PT LONG TERM GOAL #5   Title  Patient ambulates 400' outdoors on paved surfaces with LRAD with supervision for community mobility.     Time  12    Period  Weeks    Status  New    Target Date  01/20/19      Additional Long Term Goals   Additional Long Term Goals  Yes      PT LONG TERM GOAL #6   Title  Patient negotiates ramps, curbs & 2 steps similar to home entrance with LRAD with supervision for safe community access.    Time  12    Period  Weeks    Status  New    Target Date  01/20/19      PT LONG TERM GOAL #7   Title  Patient and husband demonstrate & verbalize understanding of ongoing HEP & fitness plan.     Time  12    Period  Weeks    Status  New    Target Date  01/20/19             Plan - 10/26/18 1500    Clinical Impression Statement  This 85yo  female has history of CVA 10 years ago and was ambulatory without device at community level & managing her household. She has recent CVA with right side hemiparesis / weakness and cognitive / speech issues. She was not able to follow simple one step commands while ambulating but could follow inconsistently if seated & focused. Berg Balance 20/56 indicates high fall risk & dependency in standing ADLs. Timed Up & Go without device with hand hold moderate assist 24.06sec indicates high fall risk and significant concerns with report that she leaves walker outside bathroom & uses counter /walls to enter /exit bathroom. Gait velocity of 1.60 ft/sec and deviations with rolling walker also indicate fall risk. She requires minimal assist to negotiate ramps & curbs with rolling walker impairing her ability to access community safely. Patient appears would benefit from skilled PT services to improve function & safety.     History and Personal Factors relevant to plan of care:  HTN, DM, CVA 1345yrs ago, A-Fib, chronic LBP,    Clinical Presentation  Stable    Clinical Decision Making  Low    Rehab Potential  Good    PT Frequency  2x / week    PT Duration  12 weeks    PT Treatment/Interventions  ADLs/Self Care Home Management;DME Instruction;Gait training;Stair training;Functional mobility training;Therapeutic activities;Therapeutic exercise;Balance training;Neuromuscular re-education;Patient/family education;Vestibular    PT Next Visit Plan  establish HEP for RLE strength & balance    Consulted and Agree with Plan of Care  Patient;Family member/caregiver    Family Member Consulted  husband, [name]       Patient will benefit from skilled therapeutic intervention in order to improve the following deficits and impairments:  Abnormal gait, Decreased activity tolerance, Decreased balance, Decreased cognition, Decreased coordination, Decreased endurance, Decreased knowledge of use of DME, Decreased mobility,  Decreased strength, Dizziness, Postural dysfunction, Pain  Visit Diagnosis: Hemiplegia and hemiparesis following cerebral infarction affecting right dominant side (HCC)  Unsteadiness on feet  Other abnormalities of gait and mobility  History of falling  Abnormal posture     Problem List Patient Active Problem List   Diagnosis Date Noted  . Left middle cerebral artery stroke (HCC) 10/06/2018  . Dyslipidemia   . Diabetes mellitus type 2 in nonobese (HCC)   . Atrial fibrillation (HCC)   . Tachypnea   . Stage 3 chronic kidney disease (HCC)   . Stroke [facility](HCC) ischemic embolic L MCA  d/t AF 10/03/2018  . 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 Frequent falls 10/13/2017  . Vitamin D deficiency 04/22/2017  . Slurring of speech 03/31/2017  . Back pain 09/14/2016  . Compression fracture of thoracic vertebra (HCC) 09/14/2016  . S/P vertebroplasty 09/14/2016  . Chronic diastolic heart failure (HCC) 09/14/2016  . HTN (hypertension) 09/14/2016  . History of CVA (cerebrovascular accident) 09/14/2016  . Hypokalemia 09/11/2016  . Paraspinal hematoma 09/11/2016  . Osteoporosis 05/27/2016  . Hypothyroidism 09/11/2014  . Encounter for therapeutic drug monitoring 11/28/2013  . Persistent atrial fibrillation (HCC) 12/15/2012  . HLD (hyperlipidemia) 03/27/2011    [name] PT, DPT 10/27/2018, 2:52 PM  [facility] [address] [place], [place], [address] Phone: [phone]   Fax:  [phone]  Name: [name] MRN: [id number] Date of Birth: 1929-11-14

## 2018-10-29 NOTE — Therapy (Signed)
[facility] [address] [place], [place], [address] Phone: [phone]   Fax:  [phone]  Speech Language Pathology Evaluation  Patient Details  Name: [name] MRN: [id number] Date of Birth: [birth year] Referring Provider (SLP): Dr. Wynn Banker   Encounter Date: 10/27/2018  End of Session - 10/29/18 1250    Visit Number  1    Number of Visits  25     Date for SLP Re-Evaluation  01/25/19    Authorization Type  MCR    Authorization Time Period  90 days    SLP Start Time  1445    SLP Stop Time   1550    SLP Time Calculation (min)  65 min    Activity Tolerance  Patient tolerated treatment well;Other (comment)   pt easily frustrated      Past Medical History:  Diagnosis Date  . Atrial fibrillation (HCC)   . Chronic anticoagulation   . Chronic anticoagulation   . CVA (cerebrovascular accident) (HCC)   . Diabetes mellitus   . History of chicken pox   . Hyperlipidemia   . Hypertension     Past Surgical History:  Procedure Laterality Date  . APPENDECTOMY    . BACK SURGERY    . CARDIAC CATHETERIZATION  11/09/91   EF 70%  . DILATION AND CURETTAGE OF UTERUS    . IR PERCUTANEOUS ART THROMBECTOMY/INFUSION INTRACRANIAL INC DIAG ANGIO  10/03/2018  . RADIOLOGY WITH ANESTHESIA N/A 10/03/2018   Procedure: IR WITH ANESTHESIA;  Surgeon: Radiologist, Medication, MD;  Location: [facility] OR;  Service: Radiology;  Laterality: N/A;    There were no vitals filed for this visit.  Subjective Assessment - 10/29/18 1229    Subjective  "My right... soft, it's sticking it."    Patient is accompained by:  Family member   Rocky Link, husband   Currently in Pain?  Yes    Pain Score  4    pt rates on visual pain scale        SLP Evaluation [facility] - 10/29/18 1229      SLP Visit Information   SLP Received On  10/27/18    Referring Provider (SLP)  Dr. Wynn Banker    Onset Date  10/06/18    Medical Diagnosis  L CVA       Subjective   Subjective  none stated    Patient/Family Stated Goal  for pt to be able to communicate with grandchildren, great-grandchildren      General Information   HPI  Pt is an 85 y.o. female with history of Afib on coumadin, stroke 10 years ago, DM, HTN, HLD admitted to [facility] 10/06/18. MRI showed acute L MCA infarct (s/p IR but unsuccessful) with largest area of infarction in the L parietal lobe with extension into the insula and L frontal lobe.    Behavioral/Cognition  alert/easily (verbally) agitated    Mobility Status  uses a walker      Balance Screen   Has the patient fallen in the past 6 months  --   See PT eval     Prior Functional Status   Cognitive/Linguistic Baseline  Within functional limits    Baseline deficit details  per chart review, limited social life and participation in tasks premorbidly    Type of Home  House     Lives With  Spouse    Available Support  Family    Vocation  Retired   worked in a bank  Cognition   Overall Cognitive Status  Impaired/Different from baseline   difficult to assess due to expressive and receptive aphasia   Area of Impairment  Attention   functional attention deficits; easily distracted in busy env   Current Attention Level  Sustained    Following Commands  Follows one step commands inconsistently;Follows one step commands with increased time    Attention  --      Auditory Comprehension   Overall Auditory Comprehension  Impaired    Yes/No Questions  Impaired    Basic Biographical Questions  76-100% accurate   100%   Basic Immediate Environment Questions  75-100% accurate   100% with extended time   Complex Questions  50-74% accurate   50%   Commands  Impaired    One Step Basic Commands  75-100% accurate   75%, in context, no distractions   Two Step Basic Commands  0-24% accurate   22%   Multistep Basic Commands  0-24% accurate   0%   Conversation  Simple   fluent jargon, unaware of errors   Interfering Components   Visual impairments      Visual Recognition/Discrimination   Discrimination  Exceptions to [facility]    [employer] Drawings  Other (comment)   able in f:6     Reading Comprehension   Reading Status  Impaired   not formally assessed; will assess further     Expression   Primary Mode of Expression  Verbal      Verbal Expression   Overall Verbal Expression  Impaired    Initiation  No impairment    Automatic Speech  Name;Social Response;Counting    Level of Generative/Spontaneous Verbalization  Conversation   fluent, empty speech, paraphasias, jargon   Repetition  Impaired    Level of Impairment  Word level    Naming  Impairment    Confrontation  0-24% accurate   1/20   Other Naming Comments  aware of 2/19 errors    Verbal Errors  Neologisms;Phonemic paraphasias;Jargon;Not aware of errors;Perseveration;Semantic paraphasias   stereotypic utterances "pino, pilo, pila"   Pragmatics  Impairment    Impairments  Eye contact;Abnormal affect    Effective Techniques  --   variety of strategies without success; pt easily frustrated   Non-Verbal Means of Communication  Gestures      Written Expression   Dominant Hand  Right    Written Expression  Not tested      Oral Motor/Sensory Function   Overall Oral Motor/Sensory Function  Appears within functional limits for tasks assessed      Motor Speech   Overall Motor Speech  Appears within functional limits for tasks assessed      Standardized Assessments   Standardized Assessments   Western Aphasia Battery revised     Began administration of the Western Aphasia Battery-Revised for assessment of aphasia. Pt's severity rating not yet completed; anticipate score of 43-45, which is severe as indicated by Aphasia Quotient  (0-25=very severe, 26-50=severe, 51-75=moderate, 76 and above is mild). Pt's presentation is most consistent with Wernicke's subtype, characterized by severely impaired verbal expression, moderate-severely impaired auditory  comprehension, and moderate-severely impaired repetition. Reading and writing not formally assessed this date. Pt's verbal expression is characterized by fluent, semantic jargon, over-learned phrases, neologisms, phonemic and semantic paraphasias, frequent perseverations and stereotypic utterances. Awareness of errors is <10%.                    SLP Education - 10/29/18 1249  Education Details  aphasia education provided; AAC    Person(s) Educated  Patient;Spouse    Methods  Explanation;Handout    Comprehension  Verbalized understanding;Need further instruction       SLP Short Term Goals - 10/29/18 1308      SLP SHORT TERM GOAL #1   Title  Pt will demo awareness of 50% of errors in a functional task with usual mod A (verbal/written cues).     Time  6    Period  Weeks    Status  New      SLP SHORT TERM GOAL #2   Title  Pt will communicate preferences or needs (food, activities, medical etc) x 3 sessions with usual mod A (AAC if necessary).    Time  6    Period  Weeks    Status  New      SLP SHORT TERM GOAL #3   Title  Pt will communicate personal details (medical history, family, name, age, etc) with usual mod A x 3 sessions (AAC if necessary)    Time  6    Period  Weeks    Status  New       SLP Long Term Goals - 10/29/18 1318      SLP LONG TERM GOAL #1   Title  Pt will attempt error correction in 3/5 opportunities when cued appropriately by communication partner x 3 sessions.    Time  12    Period  Weeks    Status  New      SLP LONG TERM GOAL #2   Title  Family will demo understanding of at least 4 appropriate ways to enhance pt's comprehension/expression during 4 sessions.     Time  12    Period  Weeks    Status  New      SLP LONG TERM GOAL #3   Title  Pt will reduce social isolation risks by communicating social introductions, common social messages, and personal interests using multimodal communication with occasional min A from communication  partner over 4 sessions.    Time  12    Period  Weeks    Status  New      SLP LONG TERM GOAL #4   Title  Pt/family will report improvement in understanding of pt's needs and requests than prior to ST.    Time  12    Period  Weeks    Status  New       Plan - 10/29/18 1251    Clinical Impression Statement  Patient presents with what is most likely severe Wernicke's aphasia (Western Aphasia Battery-Revised initiated today, anticipate Aphasia Quotient no greater than 45, in Severe range), with expressive > receptive deficits and minimal awareness of errors. Expression characterized by fluent jargon with neologisms, perseverations, phonemic and semantic paraphasias. Pt has islands of fluent speech, when expressing irritation or with over-learned phrases ("I need to go to the bathroom"). Pt easily frustrated even with encouragement and gentle cuing. Auditory comprehension is marginally better than verbal expression, however limited to simple, single-step commands in context without external distractions. There may be an element of apraxia however this is difficult to assess. Husband reports extreme frustration at home for pt and himself with constant communication breakdowns. He is requesting information about communication aids today; I educated pt and husband re: different AAC options and submitted request for [employer] TouchTalk trial today at his request. Note that pt's frustration/awareness limited pt's participation/progress in rehab. I recommend skilled ST to  address maximize communication and for communication partner training/AAC for wants/needs, safety, independence and QOL.    Speech Therapy Frequency  2x / week    Duration  --   12 weeks or 25 visits   Treatment/Interventions  Cognitive reorganization;Multimodal communcation approach;Compensatory strategies;Language facilitation;Compensatory techniques;Cueing hierarchy;Internal/external aids;Functional tasks;SLP instruction and  feedback;Patient/family education    Potential to Achieve Goals  Good    Potential Considerations  Cooperation/participation level;Severity of impairments;Ability to learn/carryover information    Consulted and Agree with Plan of Care  Patient;Family member/caregiver    Family Member Consulted  Husband Rocky LinkKen       Patient will benefit from skilled therapeutic intervention in order to improve the following deficits and impairments:   Aphasia  Cognitive communication deficit  Apraxia    Problem List Patient Active Problem List   Diagnosis Date Noted  . Left middle cerebral artery stroke (HCC) 10/06/2018  . Dyslipidemia   . Diabetes mellitus type 2 in nonobese (HCC)   . Atrial fibrillation (HCC)   . Tachypnea   . Stage 3 chronic kidney disease (HCC)   . Stroke [facility](HCC) ischemic embolic L MCA  d/t AF 10/03/2018  . 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 Frequent falls 10/13/2017  . Vitamin D deficiency 04/22/2017  . Slurring of speech 03/31/2017  . Back pain 09/14/2016  . Compression fracture of thoracic vertebra (HCC) 09/14/2016  . S/P vertebroplasty 09/14/2016  . Chronic diastolic heart failure (HCC) 09/14/2016  . HTN (hypertension) 09/14/2016  . History of CVA (cerebrovascular accident) 09/14/2016  . Hypokalemia 09/11/2016  . Paraspinal hematoma 09/11/2016  . Osteoporosis 05/27/2016  . Hypothyroidism 09/11/2014  . Encounter for therapeutic drug monitoring 11/28/2013  . Persistent atrial fibrillation (HCC) 12/15/2012  . HLD (hyperlipidemia) 03/27/2011   Rondel BatonMary Beth Kamrynn Melott, MS, CCC-SLP Speech-Language Pathologist   Arlana LindauMary E Asaad Gulley 10/29/2018, 1:26 PM  [facility] [address] [place], [place], [address] Phone: [phone]   Fax:  [phone]  Name: Pincus LargeCarolyn W White Reichard MRN: [id number] Date of Birth: October 14, 1930

## 2018-10-31 ENCOUNTER — Ambulatory Visit: Payer: Medicare Other | Admitting: Speech Pathology

## 2018-11-01 ENCOUNTER — Encounter: Payer: Medicare Other | Admitting: Speech Pathology

## 2018-11-03 ENCOUNTER — Ambulatory Visit: Payer: Medicare Other

## 2018-11-03 DIAGNOSIS — R2681 Unsteadiness on feet: Secondary | ICD-10-CM | POA: Diagnosis not present

## 2018-11-03 DIAGNOSIS — R2689 Other abnormalities of gait and mobility: Secondary | ICD-10-CM | POA: Diagnosis not present

## 2018-11-03 DIAGNOSIS — R4701 Aphasia: Secondary | ICD-10-CM

## 2018-11-03 DIAGNOSIS — Z9181 History of falling: Secondary | ICD-10-CM | POA: Diagnosis not present

## 2018-11-03 DIAGNOSIS — I69351 Hemiplegia and hemiparesis following cerebral infarction affecting right dominant side: Secondary | ICD-10-CM | POA: Diagnosis not present

## 2018-11-03 DIAGNOSIS — R482 Apraxia: Secondary | ICD-10-CM

## 2018-11-03 DIAGNOSIS — R293 Abnormal posture: Secondary | ICD-10-CM | POA: Diagnosis not present

## 2018-11-03 DIAGNOSIS — R41841 Cognitive communication deficit: Secondary | ICD-10-CM

## 2018-11-03 NOTE — Telephone Encounter (Signed)
I am sorry to hear that. Thank you for letting me know. ?

## 2018-11-03 NOTE — Therapy (Signed)
[facility] [address] [place], [place], [address] Phone: [phone]   Fax:  [phone]  Speech Language Pathology Treatment  Patient Details  Name: [name] MRN: [id number] Date of Birth: Aug 22, 1930 Referring Provider (SLP): Dr. Wynn Banker   Encounter Date: 11/03/2018  End of Session - 11/03/18 1648    Visit Number  2    Number of Visits  25    Date for SLP Re-Evaluation  01/25/19    Authorization Type  MCR    Authorization Time Period  90 days    SLP Start Time  1532    SLP Stop Time   1625    SLP Time Calculation (min)  53 min    Activity Tolerance  Patient tolerated treatment well       Past Medical History:  Diagnosis Date  . Atrial fibrillation (HCC)   . Chronic anticoagulation   . Chronic anticoagulation   . CVA (cerebrovascular accident) (HCC)   . Diabetes mellitus   . History of chicken pox   . Hyperlipidemia   . Hypertension     Past Surgical History:  Procedure Laterality Date  . APPENDECTOMY    . BACK SURGERY    . CARDIAC CATHETERIZATION  11/09/91   EF 70%  . DILATION AND CURETTAGE OF UTERUS    . IR PERCUTANEOUS ART THROMBECTOMY/INFUSION INTRACRANIAL INC DIAG ANGIO  10/03/2018  . RADIOLOGY WITH ANESTHESIA N/A 10/03/2018   Procedure: IR WITH ANESTHESIA;  Surgeon: Radiologist, Medication, MD;  Location: [facility] OR;  Service: Radiology;  Laterality: N/A;    There were no vitals filed for this visit.  Subjective Assessment - 11/03/18 1631    Subjective  "Hi."    Patient is accompained by:  Family member   husband           ADULT SLP TREATMENT - 11/03/18 1631      General Information   Behavior/Cognition  Alert;Cooperative;Pleasant mood;Requires cueing      Treatment Provided   Treatment provided  Cognitive-Linquistic      Cognitive-Linquistic Treatment   Treatment focused on  Aphasia    Skilled Treatment  SLP introduced pt to speech generating device (SGD). SLP  had pt type in icon names but pt long tapped which consistently made errors in icon name. Max cues consistently necessary for tapping letter and not pressing letters - tactile, visual, hand over hand cues all necessary to incr pt's success with this to <10%. SLP educated pt (husband) how to create icons as well as modify and take pictures for icons. Husband to take pictures of stores this weekend to add to icons. Pt perseverated on "walnut" for all spoken items. As pt/husband leaving ST room 8 minutes after session end time, husband stated pt perseverating on "bush" at home. SLP suggested husband look for house repersentation on SGD and have pt point to which room item is in, when having difficulty ID'ing objects with pt.      Assessment / Recommendations / Plan   Plan  Continue with current plan of care      Progression Toward Goals   Progression toward goals  Progressing toward goals       SLP Education - 11/03/18 1648    Education Details  SGD operation for new icons, modifying icons    Person(s) Educated  Spouse;Patient    Methods  Explanation;Demonstration;Verbal cues    Comprehension  Verbal cues required;Need further instruction;Verbalized understanding;Returned demonstration       SLP  Short Term Goals - 11/03/18 1649      SLP SHORT TERM GOAL #1   Title  Pt will demo awareness of 50% of errors in a functional task with usual mod A (verbal/written cues).     Time  6    Period  Weeks    Status  On-going      SLP SHORT TERM GOAL #2   Title  Pt will communicate preferences or needs (food, activities, medical etc) x 3 sessions with usual mod A (AAC if necessary).    Time  6    Period  Weeks    Status  On-going      SLP SHORT TERM GOAL #3   Title  Pt will communicate personal details (medical history, family, name, age, etc) with usual mod A x 3 sessions (AAC if necessary)    Time  6    Period  Weeks    Status  On-going       SLP Long Term Goals - 11/03/18 1650      SLP  LONG TERM GOAL #1   Title  Pt will attempt error correction in 3/5 opportunities when cued appropriately by communication partner x 3 sessions.    Time  12    Period  Weeks    Status  On-going      SLP LONG TERM GOAL #2   Title  Family will demo understanding of at least 4 appropriate ways to enhance pt's comprehension/expression during 4 sessions.     Time  12    Period  Weeks    Status  On-going      SLP LONG TERM GOAL #3   Title  Pt will reduce social isolation risks by communicating social introductions, common social messages, and personal interests using multimodal communication with occasional min A from communication partner over 4 sessions.    Time  12    Period  Weeks    Status  New      SLP LONG TERM GOAL #4   Title  Pt/family will report improvement in understanding of pt's needs and requests than prior to ST.    Time  12    Period  Weeks    Status  On-going       Plan - 11/03/18 1649    Clinical Impression Statement  Patient presents with what is most likely severe Wernicke's aphasia (Western Aphasia Battery-Revised initiated today, anticipate Aphasia Quotient no greater than 45, in Severe range), with expressive > receptive deficits and minimal awareness of errors. Expression characterized by fluent jargon with neologisms, perseverations, phonemic and semantic paraphasias. Pt has islands of fluent speech, when expressing irritation or with over-learned phrases ("I need to go to the bathroom"). Pt easily frustrated even with encouragement and gentle cuing. Auditory comprehension is marginally better than verbal expression, however limited to simple, single-step commands in context without external distractions. There may be an element of apraxia however this is difficult to assess. Husband reports extreme frustration at home for pt and himself with constant communication breakdowns. He is requesting information about communication aids today; I educated pt and husband re:  different AAC options and submitted request for [employer] TouchTalk trial today at his request. Note that pt's frustration/awareness limited pt's participation/progress in rehab. I recommend skilled ST to address maximize communication and for communication partner training/AAC for wants/needs, safety, independence and QOL.    Speech Therapy Frequency  2x / week    Duration  --   12 weeks or  25 visits   Treatment/Interventions  Cognitive reorganization;Multimodal communcation approach;Compensatory strategies;Language facilitation;Compensatory techniques;Cueing hierarchy;Internal/external aids;Functional tasks;SLP instruction and feedback;Patient/family education    Potential to Achieve Goals  Good    Potential Considerations  Cooperation/participation level;Severity of impairments;Ability to learn/carryover information    Consulted and Agree with Plan of Care  Patient;Family member/caregiver    Family Member Consulted  Husband Rocky Link       Patient will benefit from skilled therapeutic intervention in order to improve the following deficits and impairments:   Aphasia  Cognitive communication deficit  Apraxia    Problem List Patient Active Problem List   Diagnosis Date Noted  . Left middle cerebral artery stroke (HCC) 10/06/2018  . Dyslipidemia   . Diabetes mellitus type 2 in nonobese (HCC)   . Atrial fibrillation (HCC)   . Tachypnea   . Stage 3 chronic kidney disease (HCC)   . Stroke [facility]) ischemic embolic L MCA  d/t AF 10/03/2018  . 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 Frequent falls 10/13/2017  . Vitamin D deficiency 04/22/2017  . Slurring of speech 03/31/2017  . Back pain 09/14/2016  . Compression fracture of thoracic vertebra (HCC) 09/14/2016  . S/P vertebroplasty 09/14/2016  . Chronic diastolic heart failure (HCC) 09/14/2016  . HTN (hypertension) 09/14/2016  . History of CVA (cerebrovascular accident) 09/14/2016  . Hypokalemia 09/11/2016  . Paraspinal  hematoma 09/11/2016  . Osteoporosis 05/27/2016  . Hypothyroidism 09/11/2014  . Encounter for therapeutic drug monitoring 11/28/2013  . Persistent atrial fibrillation (HCC) 12/15/2012  . HLD (hyperlipidemia) 03/27/2011    [facility] ,MS, CCC-SLP  11/03/2018, 4:50 PM  [facility] [address] [place], [place], [address] Phone: [phone]   Fax:  [phone]   Name: Zabdi Deberardinis MRN: [id number] Date of Birth: [birth year]

## 2018-11-03 NOTE — Telephone Encounter (Signed)
SPOKE  TO PATIENT 'S HUSBAND. HE STATES HE WAS INFORMED TO LET DR [facility] KNOW - PATIENT'S MEDICATION WAS SWITCH TO ELIQUIS FROM [facility]. PATIENT RECENT HAD A STROKE AND NOW AT HOME- RECEIVING PT ,OT.  NO RECALL APPOINTMENT UNTIL 09/2019

## 2018-11-04 ENCOUNTER — Ambulatory Visit: Payer: Medicare Other

## 2018-11-04 DIAGNOSIS — Z9181 History of falling: Secondary | ICD-10-CM | POA: Diagnosis not present

## 2018-11-04 DIAGNOSIS — R2681 Unsteadiness on feet: Secondary | ICD-10-CM | POA: Diagnosis not present

## 2018-11-04 DIAGNOSIS — R41841 Cognitive communication deficit: Secondary | ICD-10-CM

## 2018-11-04 DIAGNOSIS — R293 Abnormal posture: Secondary | ICD-10-CM | POA: Diagnosis not present

## 2018-11-04 DIAGNOSIS — R4701 Aphasia: Secondary | ICD-10-CM

## 2018-11-04 DIAGNOSIS — R482 Apraxia: Secondary | ICD-10-CM

## 2018-11-04 DIAGNOSIS — R2689 Other abnormalities of gait and mobility: Secondary | ICD-10-CM | POA: Diagnosis not present

## 2018-11-04 DIAGNOSIS — I69351 Hemiplegia and hemiparesis following cerebral infarction affecting right dominant side: Secondary | ICD-10-CM | POA: Diagnosis not present

## 2018-11-04 NOTE — Therapy (Signed)
[facility] [address] [place], [place], [address] Phone: [phone]   Fax:  [phone]  Speech Language Pathology Treatment  Patient Details  Name: [name] MRN: [id number] Date of Birth: Mar 30, 1930 Referring Provider (SLP): Dr. Wynn Banker   Encounter Date: 11/04/2018  End of Session - 11/04/18 1529    Visit Number  3    Number of Visits  25    Date for SLP Re-Evaluation  01/25/19    Authorization Type  MCR    Authorization Time Period  90 days    SLP Start Time  1319    SLP Stop Time   1402    SLP Time Calculation (min)  43 min    Activity Tolerance  Patient tolerated treatment well       Past Medical History:  Diagnosis Date  . Atrial fibrillation (HCC)   . Chronic anticoagulation   . Chronic anticoagulation   . CVA (cerebrovascular accident) (HCC)   . Diabetes mellitus   . History of chicken pox   . Hyperlipidemia   . Hypertension     Past Surgical History:  Procedure Laterality Date  . APPENDECTOMY    . BACK SURGERY    . CARDIAC CATHETERIZATION  11/09/91   EF 70%  . DILATION AND CURETTAGE OF UTERUS    . IR PERCUTANEOUS ART THROMBECTOMY/INFUSION INTRACRANIAL INC DIAG ANGIO  10/03/2018  . RADIOLOGY WITH ANESTHESIA N/A 10/03/2018   Procedure: IR WITH ANESTHESIA;  Surgeon: Radiologist, Medication, MD;  Location: [facility] OR;  Service: Radiology;  Laterality: N/A;    There were no vitals filed for this visit.  Subjective Assessment - 11/04/18 1325    Subjective  "The family is lossus."    Patient is accompained by:  Family member   husband   Currently in Pain?  No/denies            ADULT SLP TREATMENT - 11/04/18 1401      General Information   Behavior/Cognition  Alert;Cooperative;Pleasant mood;Requires cueing      Treatment Provided   Treatment provided  Cognitive-Linquistic      Cognitive-Linquistic Treatment   Treatment focused on  Aphasia    Skilled Treatment  SLP  assisted pt husband and pt with speech generating device (SGD), as husband had reset device to factory settings. Pt's husband req'd occasional min A to generate new icons involving taking pictures. SLP engaged pt in asking her birthdate, her last name, adn other rote/personal information. Pt req'd max/total A for generating functional speech/language. Pt answered questions re likes and dislikes with food/drink, pointing to desired answer f:6 75%. Aphasic (semantic) errors and apraxic behaviors both noted today from pt; awareness of errors approx 65%.       Assessment / Recommendations / Plan   Plan  Continue with current plan of care      Progression Toward Goals   Progression toward goals  Progressing toward goals       SLP Education - 11/04/18 1529    Education Details  SGC operation for icon generation    Person(s) Educated  Patient;Spouse    Methods  Explanation;Demonstration;Verbal cues    Comprehension  Verbalized understanding;Need further instruction;Returned demonstration;Verbal cues required       SLP Short Term Goals - 11/04/18 1533      SLP SHORT TERM GOAL #1   Title  Pt will demo awareness of 50% of errors in a functional task with usual mod A (verbal/written cues).  Time  6    Period  Weeks    Status  On-going      SLP SHORT TERM GOAL #2   Title  Pt will communicate preferences or needs (food, activities, medical etc) x 3 sessions with usual mod A (AAC if necessary).    Time  6    Period  Weeks    Status  On-going      SLP SHORT TERM GOAL #3   Title  Pt will communicate personal details (medical history, family, name, age, etc) with usual mod A x 3 sessions (AAC if necessary)    Time  6    Period  Weeks    Status  On-going       SLP Long Term Goals - 11/04/18 1533      SLP LONG TERM GOAL #1   Title  Pt will attempt error correction in 3/5 opportunities when cued appropriately by communication partner x 3 sessions.    Time  12    Period  Weeks    Status   On-going      SLP LONG TERM GOAL #2   Title  Family will demo understanding of at least 4 appropriate ways to enhance pt's comprehension/expression during 4 sessions.     Time  12    Period  Weeks    Status  On-going      SLP LONG TERM GOAL #3   Title  Pt will reduce social isolation risks by communicating social introductions, common social messages, and personal interests using multimodal communication with occasional min A from communication partner over 4 sessions.    Time  12    Period  Weeks    Status  New      SLP LONG TERM GOAL #4   Title  Pt/family will report improvement in understanding of pt's needs and requests than prior to ST.    Time  12    Period  Weeks    Status  On-going       Plan - 11/04/18 1530    Clinical Impression Statement  Patient presents with what is most likely severe Wernicke's aphasia, with expressive > receptive deficits and somewhat improved (today) awareness of errors. Pt had islands of fluent, common/rote speech dueing the session today. Pt stated "It scares me" when husband/SLP talking about icons on SGD. I recommend cont'd skilled ST to address maximize communication and for communication partner training/AAC for wants/needs, safety, independence and QOL.    Speech Therapy Frequency  2x / week    Duration  --   12 weeks or 25 visits   Treatment/Interventions  Cognitive reorganization;Multimodal communcation approach;Compensatory strategies;Language facilitation;Compensatory techniques;Cueing hierarchy;Internal/external aids;Functional tasks;SLP instruction and feedback;Patient/family education    Potential to Achieve Goals  Good    Potential Considerations  Cooperation/participation level;Severity of impairments;Ability to learn/carryover information    Consulted and Agree with Plan of Care  Patient;Family member/caregiver    Family Member Consulted  Husband Rocky LinkKen       Patient will benefit from skilled therapeutic intervention in order to  improve the following deficits and impairments:   Aphasia  Apraxia  Cognitive communication deficit    Problem List Patient Active Problem List   Diagnosis Date Noted  . Left middle cerebral artery stroke (HCC) 10/06/2018  . Dyslipidemia   . Diabetes mellitus type 2 in nonobese (HCC)   . Atrial fibrillation (HCC)   . Tachypnea   . Stage 3 chronic kidney disease (HCC)   . Stroke [facility](HCC)  ischemic embolic L MCA  d/t AF 10/03/2018  . 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 Frequent falls 10/13/2017  . Vitamin D deficiency 04/22/2017  . Slurring of speech 03/31/2017  . Back pain 09/14/2016  . Compression fracture of thoracic vertebra (HCC) 09/14/2016  . S/P vertebroplasty 09/14/2016  . Chronic diastolic heart failure (HCC) 09/14/2016  . HTN (hypertension) 09/14/2016  . History of CVA (cerebrovascular accident) 09/14/2016  . Hypokalemia 09/11/2016  . Paraspinal hematoma 09/11/2016  . Osteoporosis 05/27/2016  . Hypothyroidism 09/11/2014  . Encounter for therapeutic drug monitoring 11/28/2013  . Persistent atrial fibrillation (HCC) 12/15/2012  . HLD (hyperlipidemia) 03/27/2011    [facility]HINKE,CARL ,MS, CCC-SLP  11/04/2018, 3:33 PM  [facility] [address] [place], [place], [address] Phone: [phone]   Fax:  [phone]   Name: [name] MRN: [id number] Date of Birth: 11-30-1929

## 2018-11-08 ENCOUNTER — Ambulatory Visit (HOSPITAL_BASED_OUTPATIENT_CLINIC_OR_DEPARTMENT_OTHER): Payer: Medicare Other | Admitting: Physical Medicine & Rehabilitation

## 2018-11-08 ENCOUNTER — Encounter: Payer: Medicare Other | Attending: Physical Medicine & Rehabilitation

## 2018-11-08 ENCOUNTER — Other Ambulatory Visit: Payer: Self-pay

## 2018-11-08 ENCOUNTER — Encounter: Payer: Self-pay | Admitting: Physical Medicine & Rehabilitation

## 2018-11-08 VITALS — BP 176/71 | HR 48 | Ht 62.0 in | Wt 112.2 lb

## 2018-11-08 DIAGNOSIS — I69390 Apraxia following cerebral infarction: Secondary | ICD-10-CM

## 2018-11-08 DIAGNOSIS — I63512 Cerebral infarction due to unspecified occlusion or stenosis of left middle cerebral artery: Secondary | ICD-10-CM

## 2018-11-08 DIAGNOSIS — I69320 Aphasia following cerebral infarction: Secondary | ICD-10-CM | POA: Diagnosis not present

## 2018-11-08 NOTE — Progress Notes (Signed)
Subjective:    Patient ID: [name], female    DOB: 30-Aug-1930, 85 y.o.   MRN: [id number] 85 year old right-handed female with history of hypertension, diet-controlled diabetes, CVA 10 years ago, chronic anticoagulation secondary to atrial fibrillation.  Lives with her spouse.   Used a cane prior to admission.   Presented 10/03/2018 with sudden onset of right-sided weakness and aphasia.  INR on admission of 2.52.  Cranial CT scan unremarkable for acute process.  CT angiogram of head and neck showed left M2 branch occlusion.  MRI infarction, left MCA territory.   No hemorrhage.  Interventional radiology attempted revascularization unsuccessful.  Echocardiogram with ejection fraction of 60%.  Systolic function normal.  HPI SLP dx severe Wernicke's aphasia No falls Unable to dress or bathe The patient lives with her husband who assists her with her ADLs.  She does not drive. She continued continues to attend outpatient PT OT and speech Her husband states that at times the patient may come out with the phrase but oftentimes gets stuck on 1 word or sound Pain Inventory Average Pain 1 Pain Right Now 0 My pain is na  In the last 24 hours, has pain interfered with the following? General activity 0 Relation with others 0 Enjoyment of life 0 What TIME of day is your pain at its worst? na Sleep (in general) NA  Pain is worse with: na Pain improves with: na Relief from Meds: na  Mobility walk with assistance use a walker ability to climb steps?  no do you drive?  no  Function retired I need assistance with the following:  dressing, bathing, toileting, meal prep, household duties and shopping  Neuro/Psych No problems in this area  Prior Studies Any changes since last visit?  no  Physicians involved in your care Any changes since last visit?  no   Family History  Problem Relation Age of Onset  . Heart disease Mother   . Hypertension Mother   . Stroke Mother     . Heart disease Father   . Heart failure Brother   . Osteoporosis Neg Hx    Social History   Socioeconomic History  . Marital status: Married    Spouse name: Not on file  . Number of children: Not on file  . Years of education: Not on file  . Highest education level: Not on file  Occupational History  . Not on file  Social Needs  . Financial resource strain: Not on file  . Food insecurity:    Worry: Not on file    Inability: Not on file  . Transportation needs:    Medical: Not on file    Non-medical: Not on file  Tobacco Use  . Smoking status: Former Smoker    Last attempt to quit: 10/19/1998    Years since quitting: 20.0  . Smokeless tobacco: Never Used  Substance and Sexual Activity  . Alcohol use: No  . Drug use: No  . Sexual activity: Not on file  Lifestyle  . Physical activity:    Days per week: Not on file    Minutes per session: Not on file  . Stress: Not on file  Relationships  . Social connections:    Talks on phone: Not on file    Gets together: Not on file    Attends religious service: Not on file    Active member of club or organization: Not on file    Attends meetings of clubs or organizations: Not on  file    Relationship status: Not on file  Other Topics Concern  . Not on file  Social History Narrative  . Not on file   Past Surgical History:  Procedure Laterality Date  . APPENDECTOMY    . BACK SURGERY    . CARDIAC CATHETERIZATION  11/09/91   EF 70%  . DILATION AND CURETTAGE OF UTERUS    . IR PERCUTANEOUS ART THROMBECTOMY/INFUSION INTRACRANIAL INC DIAG ANGIO  10/03/2018  . RADIOLOGY WITH ANESTHESIA N/A 10/03/2018   Procedure: IR WITH ANESTHESIA;  Surgeon: Radiologist, Medication, MD;  Location: [facility] OR;  Service: Radiology;  Laterality: N/A;   Past Medical History:  Diagnosis Date  . Atrial fibrillation (HCC)   . Chronic anticoagulation   . Chronic anticoagulation   . CVA (cerebrovascular accident) (HCC)   . Diabetes mellitus   . History of  chicken pox   . Hyperlipidemia   . Hypertension    BP (!) 176/71   Pulse (!) 48   Ht 5\' 2"  (1.575 m) Comment: reported  Wt 112 lb 3.2 oz (50.9 kg)   SpO2 97%   BMI 20.52 kg/m   Opioid Risk Score:   Fall Risk Score:  `1  Depression screen PHQ 2/9  Depression screen [facility] 2/9 11/08/2018 10/13/2017 09/24/2016 04/29/2016  Decreased Interest 0 0 0 0  Down, Depressed, Hopeless 1 0 0 0  PHQ - 2 Score 1 0 0 0  Altered sleeping 2 - 0 -  Tired, decreased energy 2 - 0 -  Change in appetite 0 - 0 -  Feeling bad or failure about yourself  3 - 0 -  Trouble concentrating 0 - 0 -  Moving slowly or fidgety/restless 3 - 0 -  Suicidal thoughts 0 - 0 -  PHQ-9 Score 11 - 0 -  Difficult doing work/chores Very difficult - - -  Some recent data might be hidden    Review of Systems  Constitutional: Negative.   HENT: Negative.   Eyes: Negative.   Respiratory: Negative.   Cardiovascular: Negative.   Gastrointestinal: Negative.   Endocrine: Negative.   Genitourinary: Negative.   Musculoskeletal: Negative.   Skin: Negative.   Allergic/Immunologic: Negative.   Neurological: Negative.   Hematological: Bruises/bleeds easily.  Psychiatric/Behavioral: Negative.   All other systems reviewed and are negative.      Objective:   Physical Exam Vitals signs and nursing note reviewed. Exam conducted with a chaperone present (Husband).  Constitutional:      Appearance: Normal appearance.  HENT:     Head: Normocephalic and atraumatic.     Nose: No congestion or rhinorrhea.     Mouth/Throat:     Mouth: Mucous membranes are moist.  Eyes:     Extraocular Movements: Extraocular movements intact.     Conjunctiva/sclera: Conjunctivae normal.     Pupils: Pupils are equal, round, and reactive to light.  Cardiovascular:     Rate and Rhythm: Normal rate and regular rhythm.     Pulses: Normal pulses.     Heart sounds: No murmur.  Pulmonary:     Effort: Pulmonary effort is normal. No respiratory distress.      Breath sounds: Normal breath sounds. No wheezing or rhonchi.  Abdominal:     General: Abdomen is flat. Bowel sounds are normal. There is distension.     Palpations: Abdomen is soft.     Tenderness: There is no abdominal tenderness.     Comments: Positive tympany with percussion  Musculoskeletal: Normal range of motion.  Comments: No pain with upper extremity or lower extremity range of motion.   Skin:    General: Skin is warm and dry.  Neurological:     General: No focal deficit present.     Mental Status: She is alert and oriented to person, place, and time.     Comments: Patient has difficulties with naming Evidence of medial edges of Decreased comprehension of more complex information Apraxia noted in the upper extremities with manual muscle testing Motor strength is 5/5 bilateral deltoid bicep tricep grip hip flexion extension ankle dorsiflexor.  She needs gestural cues for manual muscle testing  Psychiatric:        Mood and Affect: Mood normal.        Behavior: Behavior normal.    Ambulates with close standby assistance without assistive device otherwise can use a cane modified independent       Assessment & Plan:  1.  Left posterior branch MCA infarct with Wernicke's aphasia, apraxia which are affecting her ADLs and to a lesser extent her mobility. Her husband stays with her except for very quick trips to the local grocery store when the patient is resting.  We discussed that she should still not be left alone but likely in the next several months her safety awareness and communication should improve.  Continue outpatient PT OT speech, as discussed with patient husband I would expect that she will need speech therapy for longer than OT and PT given timeframe of recovery.

## 2018-11-08 NOTE — Patient Instructions (Signed)
Aphasia Aphasia is a language disorder that affects the part of your brain that you use to communicate. Aphasia does not affect your intelligence, but you may have trouble:  Speaking.  Understanding speech.  Reading.  Writing. Some people with aphasia may also have trouble with memory or attention. Aphasia can happen to anyone at any age, but it is most common in older adults. What are the causes? This condition is caused by damage to the language centers of the brain. Damage may be caused by:  Stroke. This causes a disruption of blood flow to certain areas of the brain. Stroke is the most common cause of aphasia.  Traumatic brain injury (TBI).  Brain tumor.  Infection of the brain tissues.  Nervous system disease that gradually gets worse (progressiveneurological disorder), such as dementia or multiple sclerosis (MS).  Brain surgery. What are the signs or symptoms? Symptoms of this condition include:  Trouble finding the right words.  Difficulty expressing thoughts and needs through speech.  Using the wrong words, nonsense words, or jargon.  Talking in sentences that do not make sense or that are not grammatically correct.  Being unable to repeat back words and phrases.  Difficulty expressing ideas through writing.  Difficulty comprehending when reading.  Being unable to understand other people's speech.  Having trouble understanding numbers. The condition affects people differently. Symptoms may start suddenly or come on gradually, depending on the underlying cause. How is this diagnosed? This condition may be diagnosed based on a screening of your ability to communicate as soon as symptoms start, or are medically stable after a stroke or brain injury. Later, a more comprehensive assessment may be conducted either in the hospital or rehabilitation center. The assessment may test your ability to:  Use speech to communicate your personal needs.  Use muscles in your  mouth and throat for speaking and swallowing.  Express ideas with speech or other means of communication, such as hand gestures.  Make conversation with others across a variety of topics.  Hear and understand speech.  Understand and produce written material.  Manage memory and attention associated with communication. How is this treated? Treatment for this condition depends on your needs and abilities. The goal is to help restore your ability to communicate or find ways to manage communication challenges. Common treatments include:  Speech-language therapy. Part of this may include: ? Re-building intonation, sentence structure, and vocabulary. ? Learning other ways to communicate, such as using word books, communication boards, or special software programs. ? Learning to communicate with writing, sign language, or hand gestures. ? Using a combination of methods to communicate.  Working with family members. This may include: ? Learning ways to communicate. ? Emotional support.  Support groups.  Occupational therapy. This can help to find devices to assist with daily living activities. Treatment usually begins as soon as possible. It may begin while you are in the hospital and continue in a rehabilitation center or at home. In some cases, aphasia may improve quickly on its own. In other cases, recovery occurs more slowly over time. Follow these instructions at home:   Keep all follow-up visits as told by your health care provider. This is important.  Make sure you have a good support system at home.  Find a support group. This can help you connect with others who are going through the same thing.  Try the following tips while communicating: ? Use short, simple sentences. Ask family members to do the same. Sentences that require   one-word or short answers are easiest. ? Avoid distractions like background noise when trying to listen or talk. ? Try communicating with gestures,  pointing, writing, or drawing. ? Talk slowly. Ask family members to talk to you slowly. ? Maintain eye contact when communicating. Ask family members to do the same when communicating with you. ? Ask family members to give you time to respond or to engage in conversation with them. Contact a health care provider if:  Your symptoms change or get worse.  You are struggling with anxiety or depression. Get help right away if you have:  Any symptoms of a stroke. "BE FAST" is an easy way to remember the main warning signs of a stroke: ? B - Balance. Signs are dizziness, sudden trouble walking, or loss of balance. ? E - Eyes. Signs are trouble seeing or a sudden change in vision. ? F - Face. Signs are a sudden weakness or numbness of the face, or the face or eyelid drooping on one side. ? A - Arms. Signs are weakness or numbness in an arm. This happens suddenly and usually on one side of the body. ? S - Speech. Signs are sudden trouble speaking, slurred speech, or trouble understanding what people say. ? T - Time. Time to call emergency service. Write down what time symptoms started.  Other signs of a stroke, such as: ? A sudden, severe headache with no known cause. ? Nausea or vomiting. ? Seizure. Summary  Aphasia is a language disorder that results from damage to the part of your brain that helps you use language to communicate. Aphasia does not affect your intelligence, but may cause difficulty with speech, writing, reading, or understanding others.  In some cases, aphasia may improve quickly on its own. In other cases, recovery occurs more slowly over time.  Get help right away if you have symptoms of a stroke. This information is not intended to replace advice given to you by your health care provider. Make sure you discuss any questions you have with your health care provider. Document Released: 06/27/2002 Document Revised: 11/02/2017 Document Reviewed: 11/02/2017 Elsevier Interactive  Patient Education  2019 [employer].

## 2018-11-09 ENCOUNTER — Other Ambulatory Visit (HOSPITAL_COMMUNITY): Payer: Self-pay | Admitting: Interventional Radiology

## 2018-11-09 ENCOUNTER — Ambulatory Visit: Payer: Medicare Other

## 2018-11-09 DIAGNOSIS — R2689 Other abnormalities of gait and mobility: Secondary | ICD-10-CM | POA: Diagnosis not present

## 2018-11-09 DIAGNOSIS — Z9181 History of falling: Secondary | ICD-10-CM | POA: Diagnosis not present

## 2018-11-09 DIAGNOSIS — R482 Apraxia: Secondary | ICD-10-CM

## 2018-11-09 DIAGNOSIS — R41841 Cognitive communication deficit: Secondary | ICD-10-CM

## 2018-11-09 DIAGNOSIS — R4701 Aphasia: Secondary | ICD-10-CM

## 2018-11-09 DIAGNOSIS — I69351 Hemiplegia and hemiparesis following cerebral infarction affecting right dominant side: Secondary | ICD-10-CM | POA: Diagnosis not present

## 2018-11-09 DIAGNOSIS — I639 Cerebral infarction, unspecified: Secondary | ICD-10-CM

## 2018-11-09 DIAGNOSIS — R293 Abnormal posture: Secondary | ICD-10-CM | POA: Diagnosis not present

## 2018-11-09 DIAGNOSIS — R2681 Unsteadiness on feet: Secondary | ICD-10-CM | POA: Diagnosis not present

## 2018-11-09 NOTE — Therapy (Signed)
[facility] [address] [place], [place], [address] Phone: [phone]   Fax:  [phone]  Speech Language Pathology Treatment  Patient Details  Name: [name] MRN: [id number] Date of Birth: [birth year] Referring Provider (SLP): Dr. Wynn Banker   Encounter Date: 11/09/2018  End of Session - 11/09/18 1649    Visit Number  4    Number of Visits  25    Date for SLP Re-Evaluation  01/25/19    Authorization Type  MCR    Authorization Time Period  90 days    SLP Start Time  1319    SLP Stop Time   1401    SLP Time Calculation (min)  42 min    Activity Tolerance  Patient tolerated treatment well       Past Medical History:  Diagnosis Date  . Atrial fibrillation (HCC)   . Chronic anticoagulation   . Chronic anticoagulation   . CVA (cerebrovascular accident) (HCC)   . Diabetes mellitus   . History of chicken pox   . Hyperlipidemia   . Hypertension     Past Surgical History:  Procedure Laterality Date  . APPENDECTOMY    . BACK SURGERY    . CARDIAC CATHETERIZATION  11/09/91   EF 70%  . DILATION AND CURETTAGE OF UTERUS    . IR PERCUTANEOUS ART THROMBECTOMY/INFUSION INTRACRANIAL INC DIAG ANGIO  10/03/2018  . RADIOLOGY WITH ANESTHESIA N/A 10/03/2018   Procedure: IR WITH ANESTHESIA;  Surgeon: Radiologist, Medication, MD;  Location: [facility] OR;  Service: Radiology;  Laterality: N/A;    There were no vitals filed for this visit.  Subjective Assessment - 11/09/18 1323    Subjective  "Fine" (pt, re: Kirsteins yesterday)    Patient is accompained by:  Family member   husband   Currently in Pain?  No/denies            ADULT SLP TREATMENT - 11/09/18 1333      General Information   Behavior/Cognition  Alert;Cooperative;Pleasant mood;Requires cueing      Treatment Provided   Treatment provided  Cognitive-Linquistic      Cognitive-Linquistic Treatment   Treatment focused on  Aphasia    Skilled  Treatment  SLP assisted pt husband and pt with speech generating device (SGD) programming of icons using photographs. Today, husband req'd min-mod A rarely and extra time consistently. SLP engaged pt in asking her family names of children/grandchildren to enter into SGD, pt without success at repetition level/max A. Perseverative errors and jargon errors prevalent, awareness of errors appeared greater with SLP facial cues, however auditory repetition of pt's incorrect responses did not appear to assist in awareness. Pt verbalized 3-4 "automatic" 3-4 word phrases today, more than previous session/s.  SLP provided pt's husband with scenes of bedroom, bathroom, and living room as he stated at times it is challenging toknow what pt desires. He has encouraged pt to stand and use walker to show him what she is speaking of. SLP encouraged this and use of pictures to assist in husband comprehending pt's message.       Assessment / Recommendations / Plan   Plan  Continue with current plan of care      Progression Toward Goals   Progression toward goals  Progressing toward goals       SLP Education - 11/09/18 1649    Education Details  SGD use for icon generation, use of picture scenes may be helpful    Person(s) Educated  Patient;Spouse  Methods  Explanation;Demonstration;Verbal cues    Comprehension  Verbalized understanding;Returned demonstration;Verbal cues required;Need further instruction       SLP Short Term Goals - 11/09/18 1651      SLP SHORT TERM GOAL #1   Title  Pt will demo awareness of 50% of errors in a functional task with usual mod A (verbal/written cues).     Time  5    Period  Weeks    Status  On-going      SLP SHORT TERM GOAL #2   Title  Pt will communicate preferences or needs (food, activities, medical etc) x 3 sessions with usual mod A (AAC if necessary).    Time  5    Period  Weeks    Status  On-going      SLP SHORT TERM GOAL #3   Title  Pt will communicate personal  details (medical history, family, name, age, etc) with usual mod A x 3 sessions (AAC if necessary)    Time  5    Period  Weeks    Status  On-going       SLP Long Term Goals - 11/09/18 1651      SLP LONG TERM GOAL #1   Title  Pt will attempt error correction in 3/5 opportunities when cued appropriately by communication partner x 3 sessions.    Time  11    Period  Weeks    Status  On-going      SLP LONG TERM GOAL #2   Title  Family will demo understanding of at least 4 appropriate ways to enhance pt's comprehension/expression during 4 sessions.     Time  11    Period  Weeks    Status  On-going      SLP LONG TERM GOAL #3   Title  Pt will reduce social isolation risks by communicating social introductions, common social messages, and personal interests using multimodal communication with occasional min A from communication partner over 4 sessions.    Time  11    Period  Weeks    Status  New      SLP LONG TERM GOAL #4   Title  Pt/family will report improvement in understanding of pt's needs and requests than prior to ST.    Time  11    Period  Weeks    Status  On-going       Plan - 11/09/18 1650    Clinical Impression Statement  Patient presents with what is most likely severe expressive aphasia, with expressive > receptive deficits. Pt had islands of 3-4 word fluent, common/everyday speech during the session today. Husband continues to learn how to assist pt with SGD at home, and pt continues to work on expressive language ability and use of SGD. I recommend cont'd skilled ST to address maximize communication and for communication partner training/AAC for wants/needs, safety, independence and QOL.    Speech Therapy Frequency  2x / week    Duration  --   12 weeks or 25 visits   Treatment/Interventions  Cognitive reorganization;Multimodal communcation approach;Compensatory strategies;Language facilitation;Compensatory techniques;Cueing hierarchy;Internal/external aids;Functional  tasks;SLP instruction and feedback;Patient/family education    Potential to Achieve Goals  Good    Potential Considerations  Cooperation/participation level;Severity of impairments;Ability to learn/carryover information    Consulted and Agree with Plan of Care  Patient;Family member/caregiver    Family Member Consulted  Husband Rocky Link       Patient will benefit from skilled therapeutic intervention in order to improve the  following deficits and impairments:   Aphasia  Apraxia  Cognitive communication deficit    Problem List Patient Active Problem List   Diagnosis Date Noted  . Aphasia as late effect of stroke 11/08/2018  . 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 Left middle cerebral artery stroke (HCC) 10/06/2018  . Dyslipidemia   . Diabetes mellitus type 2 in nonobese (HCC)   . Atrial fibrillation (HCC)   . Tachypnea   . Stage 3 chronic kidney disease (HCC)   . Stroke [facility](HCC) ischemic embolic L MCA  d/t AF 10/03/2018  . 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 Frequent falls 10/13/2017  . Vitamin D deficiency 04/22/2017  . Slurring of speech 03/31/2017  . Back pain 09/14/2016  . Compression fracture of thoracic vertebra (HCC) 09/14/2016  . S/P vertebroplasty 09/14/2016  . Chronic diastolic heart failure (HCC) 09/14/2016  . HTN (hypertension) 09/14/2016  . History of CVA (cerebrovascular accident) 09/14/2016  . Hypokalemia 09/11/2016  . Paraspinal hematoma 09/11/2016  . Osteoporosis 05/27/2016  . Hypothyroidism 09/11/2014  . Encounter for therapeutic drug monitoring 11/28/2013  . Persistent atrial fibrillation (HCC) 12/15/2012  . HLD (hyperlipidemia) 03/27/2011    Mcleod LorisCHINKE,[place]lenne Ridge ,MS, CCC-SLP  11/09/2018, 4:52 PM  [facility] [address] [place], [place], [address] Phone: [phone]   Fax:  [phone]   Name: [name] MRN: [id number] Date of Birth: June 08, 1930

## 2018-11-09 NOTE — Patient Instructions (Signed)
Use the scenes to help you understand what Ajayla is trying to communicate. Continue to have her stand and show you, if all else fails. (please watch her while walking, and always have her use walker)

## 2018-11-09 NOTE — Telephone Encounter (Signed)
Spoke with husband and patients blood pressure was elevated this morning and was told to contact Dr Duke Salvia. Scheduled visit for tomorrow but if blood pressure within normal range this afternoon and tomorrow he may call to cancel secondary to patient having so many upcoming appointments

## 2018-11-10 ENCOUNTER — Ambulatory Visit (INDEPENDENT_AMBULATORY_CARE_PROVIDER_SITE_OTHER): Payer: Medicare Other | Admitting: Cardiovascular Disease

## 2018-11-10 ENCOUNTER — Encounter: Payer: Self-pay | Admitting: Cardiovascular Disease

## 2018-11-10 VITALS — BP 172/64 | HR 51 | Ht 62.0 in

## 2018-11-10 DIAGNOSIS — I63512 Cerebral infarction due to unspecified occlusion or stenosis of left middle cerebral artery: Secondary | ICD-10-CM

## 2018-11-10 DIAGNOSIS — Z8673 Personal history of transient ischemic attack (TIA), and cerebral infarction without residual deficits: Secondary | ICD-10-CM

## 2018-11-10 DIAGNOSIS — I4819 Other persistent atrial fibrillation: Secondary | ICD-10-CM | POA: Diagnosis not present

## 2018-11-10 DIAGNOSIS — I10 Essential (primary) hypertension: Secondary | ICD-10-CM

## 2018-11-10 DIAGNOSIS — E7800 Pure hypercholesterolemia, unspecified: Secondary | ICD-10-CM

## 2018-11-10 MED ORDER — AMLODIPINE BESYLATE 5 MG PO TABS: 5.0000 mg | ORAL_TABLET | Freq: Every day | ORAL | 3 refills | Status: DC

## 2018-11-10 NOTE — Progress Notes (Signed)
Cardiology Office Note   Date:  11/10/2018   ID:  [name], DOB 01-Aug-1930, MRN [id number]  PCP:  Janice Garbeisovec, Richard W, MD  Cardiologist:   Janice Siiffany Waverly, MD   No chief complaint on file.   History of Present Illness: [name] is an 85 y.o. female with chronic diastolic heart failure, persistent atrial fibrillation, hyperlipidemia and hypothyroidism who presents for follow up.  She was previously a patient of Dr. Patty Martinez.  She had a [employer] Myov[employer] 08/2015. She was noted to be in atrial fibrillation, but there is no evidence of ischemia.  Echocardiogram 01/2012 revealed LVEF 50-55%.  [name] had a thoracic  vertebral compression fracture and had to undergo kyphoplasty.   Warfarin was held and she was bridged with Lovenox.  This was complicated by a paraspinal hematoma.  She suffered from another vertebral fracture and struggles with pain in her back.  She is not a candidate for any further interventions.  She has been working with pain management and takes oxycodone at night to sleep. She also broken her left wrist right shoulder and just finished physical therapy.   [name] has been struggling with back pain.  She underwent a back procedure that sounds like a kyphoplasty and felt well for about 1 week.  However she then developed problems with the vertebrae above.  She saw a different orthopedic surgeon for second opinion and he recommended that she not have any further procedures but instead proceed with pain management.  She has been seeing a pain management specialist who recommended that she have an injection.  Given her history of bleeding on Lovenox a decision was made not to bridge her.  She held warfarin for 2 days.  On the second day she developed acute confusion that lasted for several hours.  She started shouting at her husband and asking, "where is my staff?".  She was unable to tell her daughter her name.  Her husband reports  that she was leaning to one side and nearly fell.  The family elected to resume her warfarin and the symptoms resolved by the following day.  [name] has been otherwise feeling well.  She has no chest pain or shortness of breath.  She denies lower extremity edema, orthopnea or PND.  She denies palpitations, lightheadedness, or dizziness.  Since her last appointment [name] was admitted 09/2018 with a L MCA stroke.  INR was therapeutic on admission.  IR attempted revascularization unsuccessfully.  Echo that admission revealed LVEF 55 to 60% with akinesis of the basal and mid anteroseptal myocardium.  Warfarin was replaced with Eliquis.  Since being discharged her husband called our office because her blood pressure has been elevated. It has been running in the 170s.  She has been feeling very tired.  She has no chest pain and her breathing has been stable.  She is going to OT and speech therapy since her stroke.  She still struggles with balance.     Past Medical History:  Diagnosis Date  . Atrial fibrillation (HCC)   . Chronic anticoagulation   . Chronic anticoagulation   . CVA (cerebrovascular accident) (HCC)   . Diabetes mellitus   . History of chicken pox   . Hyperlipidemia   . Hypertension     Past Surgical History:  Procedure Laterality Date  . APPENDECTOMY    . BACK SURGERY    . CARDIAC CATHETERIZATION  11/09/91   EF 70%  .  DILATION AND CURETTAGE OF UTERUS    . IR PERCUTANEOUS ART THROMBECTOMY/INFUSION INTRACRANIAL INC DIAG ANGIO  10/03/2018  . RADIOLOGY WITH ANESTHESIA N/A 10/03/2018   Procedure: IR WITH ANESTHESIA;  Surgeon: Radiologist, Medication, MD;  Location: [facility] OR;  Service: Radiology;  Laterality: N/A;     Current Outpatient Medications  Medication Sig Dispense Refill  . acetaminophen (TYLENOL) 325 MG tablet Take 2 tablets (650 mg total) by mouth every 4 (four) hours as needed for mild pain (or temp > 37.5 C (99.5 F)).    Marland Kitchen amLODipine (NORVASC) 5  MG tablet Take 1 tablet (5 mg total) by mouth daily. 90 tablet 3  . apixaban (ELIQUIS) 2.5 MG TABS tablet Take 1 tablet (2.5 mg total) by mouth 2 (two) times daily. 60 tablet 1  . atorvastatin (LIPITOR) 20 MG tablet Take 0.5 tablets (10 mg total) by mouth daily. 30 tablet 0  . levothyroxine (SYNTHROID, LEVOTHROID) 25 MCG tablet Take 1 tablet (25 mcg total) by mouth daily before breakfast. 30 tablet 0  . nadolol (CORGARD) 20 MG tablet Take 0.5 tablets (10 mg total) by mouth daily. 30 tablet 1  . nitroGLYCERIN (NITROSTAT) 0.4 MG SL tablet Place 0.4 mg under the tongue every 5 (five) minutes as needed (chest pain).      No current facility-administered medications for this visit.     Allergies:   Cozaar; Hydralazine; Hyzaar [losartan potassium-hctz]; Sulfa drugs cross reactors; and Lisinopril    Social History:  The patient  reports that she quit smoking about 20 years ago. She has never used smokeless tobacco. She reports that she does not drink alcohol or use drugs.   Family History:  The patient's family history includes Heart disease in her father and mother; Heart failure in her brother; Hypertension in her mother; Stroke in her mother.    ROS:  Please see the history of present illness.   Otherwise, review of systems are positive for none.   All other systems are reviewed and negative.    PHYSICAL EXAM: VS:  BP (!) 172/64   Pulse (!) 51   Ht 5\' 2"  (1.575 m)   BMI 20.52 kg/m  , BMI Body mass index is 20.52 kg/m. GENERAL:  Well appearing HEENT: Pupils equal round and reactive, fundi not visualized, oral mucosa unremarkable NECK:  No jugular venous distention, waveform within normal limits, carotid upstroke brisk and symmetric, no bruits LUNGS:  Clear to auscultation bilaterally HEART:  Irregularly irregular.  PMI not displaced or sustained,S1 and S2 within normal limits, no S3, no S4, no clicks, no rubs, no murmurs ABD:  Flat, positive bowel sounds normal in frequency in pitch, no  bruits, no rebound, no guarding, no midline pulsatile mass, no hepatomegaly, no splenomegaly EXT:  2 plus pulses throughout, no edema, no cyanosis no clubbing SKIN:  No rashes no nodules NEURO:  Cranial nerves II through XII grossly intact, motor grossly intact throughout PSYCH:  Cognitively intact, oriented to person place and time   EKG:  EKG is ordered today. The ekg ordered today demonstrates atrial fibrillation rate 86 bpm. 02/14/18: Atrial flutter.  Rate 62 bpm.   4:1 AV conduction.  LAFB.   09/20/18: Atrial flutter.  Rate 67 bpm.  4:1 AV conduction.  LAD.    Echo 10/04/18: Study Conclusions  - Left ventricle: The cavity size was normal. There was mild   concentric hypertrophy. Systolic function was normal. The   estimated ejection fraction was in the range of 55% to 60%.  Akinesis of the basal and mid anteroseptal walls. - Aortic valve: There was mild regurgitation. - Mitral valve: There was mild regurgitation. - Left atrium: The atrium was mildly dilated. - Right ventricle: The cavity size was normal. Wall thickness was   normal. Systolic function was normal. - Tricuspid valve: There was mild regurgitation. - Pulmonic valve: There was no regurgitation. - Pulmonary arteries: Systolic pressure was within the normal   range. - Inferior vena cava: The vessel was normal in size. - Pericardium, extracardiac: There was no pericardial effusion.   Recent Labs: 10/07/2018: ALT 16; BUN 11; Creatinine, Ser 1.42; Hemoglobin 10.0; Platelets 273; Potassium 3.5; Sodium 137    Lipid Panel    Component Value Date/Time   CHOL 103 10/04/2018 0510   TRIG 88 10/04/2018 0510   HDL 26 (L) 10/04/2018 0510   CHOLHDL 4.0 10/04/2018 0510   VLDL 18 10/04/2018 0510   LDLCALC 59 10/04/2018 0510   LDLCALC 78 10/13/2017 1658      Wt Readings from Last 3 Encounters:  11/08/18 112 lb 3.2 oz (50.9 kg)  10/06/18 119 lb 4.3 oz (54.1 kg)  10/04/18 115 lb 4.8 oz (52.3 kg)      ASSESSMENT AND  PLAN:  # Persistent atrial fibrillation/flutter::  [name] remains in atrial flutter.  Rate is controlled.  She had a stroke on warfarin despite a therapeutic INR.  This was appropriately switched to Eliquis in the hospital.  Continue Eliquis and nadolol.  # CVA: She recovering and doing OT.  Continue follow up with Neurology.   # Hypertension:  Blood pressure is poorly controlled.  Increase amlodipine to 5mg .  HR is low so we cannot increase nadolol.  # Hyperlipidemia: LDL 59 09/2018.  Continue atorvastatin.    Current medicines are reviewed at length with the patient today.  The patient does not have concerns regarding medicines.  The following changes have been made:  Increase amlodipine.   Labs/ tests ordered today include:   No orders of the defined types were placed in this encounter.    Disposition:   FU with Armilda Vanderlinden C. Duke Salviaandolph, MD, [facility] in 3 months.  PharmD in 1 month.     Signed, Daurice Ovando C. Duke Salviaandolph, MD, [facility]  11/10/2018 4:25 PM    [facility]

## 2018-11-10 NOTE — Patient Instructions (Signed)
Medication Instructions:  INCREASE YOUR AMLODIPINE TO 5 MG DAILY   If you need a refill on your cardiac medications before your next appointment, please call your pharmacy.   Lab work: NONE  Testing/Procedures: NONE  Follow-Up: At [facility], you and your health needs are our priority.  As part of our continuing mission to provide you with exceptional heart care, we have created designated Provider Care Teams.  These Care Teams include your primary Cardiologist (physician) and Advanced Practice Providers (APPs -  Physician Assistants and Nurse Practitioners) who all work together to provide you with the care you need, when you need it. You will need a follow up appointment in 3 months. You may see Chilton Siiffany Lakesite, MD or one of the following Advanced Practice Providers on your designated Care Team:   Corine ShelterLuke Kilroy, PA-C Judy PimpleKrista Kroeger, [place] JerseyPA-C . Marjie Skiffallie Goodrich, PA-C  Your physician recommends that you schedule a follow-up appointment in: 1 MONTH WITH PHARM D FOR BLOOD PRESSURE

## 2018-11-11 ENCOUNTER — Ambulatory Visit: Payer: Medicare Other | Admitting: Occupational Therapy

## 2018-11-11 ENCOUNTER — Encounter: Payer: Self-pay | Admitting: Occupational Therapy

## 2018-11-11 ENCOUNTER — Ambulatory Visit: Payer: Medicare Other

## 2018-11-11 DIAGNOSIS — I69351 Hemiplegia and hemiparesis following cerebral infarction affecting right dominant side: Secondary | ICD-10-CM | POA: Diagnosis not present

## 2018-11-11 DIAGNOSIS — I69315 Cognitive social or emotional deficit following cerebral infarction: Secondary | ICD-10-CM

## 2018-11-11 DIAGNOSIS — R41841 Cognitive communication deficit: Secondary | ICD-10-CM

## 2018-11-11 DIAGNOSIS — R2681 Unsteadiness on feet: Secondary | ICD-10-CM

## 2018-11-11 DIAGNOSIS — M6281 Muscle weakness (generalized): Secondary | ICD-10-CM

## 2018-11-11 DIAGNOSIS — R2689 Other abnormalities of gait and mobility: Secondary | ICD-10-CM

## 2018-11-11 DIAGNOSIS — R482 Apraxia: Secondary | ICD-10-CM

## 2018-11-11 DIAGNOSIS — R4701 Aphasia: Secondary | ICD-10-CM | POA: Diagnosis not present

## 2018-11-11 DIAGNOSIS — Z9181 History of falling: Secondary | ICD-10-CM | POA: Diagnosis not present

## 2018-11-11 DIAGNOSIS — R293 Abnormal posture: Secondary | ICD-10-CM

## 2018-11-11 NOTE — Therapy (Signed)
[facility] [address] [place], [place], [address] Phone: [phone]   Fax:  [phone]  Occupational Therapy Treatment  Patient Details  Name: [name] MRN: [id number] Date of Birth: 10-06-1930 No data recorded  Encounter Date: 11/11/2018  OT End of Session - 11/11/18 1444    Visit Number  2    Number of Visits  25    Date for OT Re-Evaluation  01/25/19    Authorization Type  Medicare and AARP supplement    Authorization - Visit Number  2    Authorization - Number of Visits  10    OT Start Time  7867    OT Stop Time  1400    OT Time Calculation (min)  44 min    Activity Tolerance  Patient limited by pain    Behavior During Therapy  Restless   DUE TO PAIN      Past Medical History:  Diagnosis Date  . Atrial fibrillation ([place])   . Chronic anticoagulation   . Chronic anticoagulation   . CVA (cerebrovascular accident) ([place])   . Diabetes mellitus   . History of chicken pox   . Hyperlipidemia   . Hypertension     Past Surgical History:  Procedure Laterality Date  . APPENDECTOMY    . BACK SURGERY    . CARDIAC CATHETERIZATION  11/09/91   EF 70%  . DILATION AND CURETTAGE OF UTERUS    . IR PERCUTANEOUS ART THROMBECTOMY/INFUSION INTRACRANIAL INC DIAG ANGIO  10/03/2018  . RADIOLOGY WITH ANESTHESIA N/A 10/03/2018   Procedure: IR WITH ANESTHESIA;  Surgeon: Radiologist, Medication, MD;  Location: [facility];  Service: Radiology;  Laterality: N/A;    There were no vitals filed for this visit.  Subjective Assessment - 11/11/18 1432    Subjective   Patient indicates not feeling well.  Reports discomfort in abdomen.     Patient is accompained by:  Family member   husband [name]   Currently in Pain?  Yes    Pain Score  --   unable to state - aphasic   Pain Location  Abdomen    Pain Orientation  Anterior;Upper;Mid;Lower    Pain Descriptors / Indicators  Aching    Pain Type  Acute pain    Pain Onset   1 to 4 weeks ago    Pain Frequency  Constant    Aggravating Factors   standing    Pain Relieving Factors  sitting                   OT Treatments/Exercises (OP) - 11/11/18 0001      ADLs   LB Dressing  Attempting to work on flexing forward at hipos while seated as needed for lower body dressing.  Patient resistant to bending forward, and rubbing across abdomen with pained look on her face.  Worked on sit to stand, stand to sit, and stand balance.  Patient grimacing with prolonged standing, and again rubbing abdomen.      ADL Comments  Patient in discomfort with standing and bending - switched activity to UE coordination exercises, so patient could sit wiht support of chair back.  Had patient's husband call PCP to explain symptoms, and set up appt for next week.        Fine Motor Coordination (Hand/Wrist)   Fine Motor Coordination  In hand manipuation training;Manipulation of small objects;Flipping cards;Picking up coins;Manipulating coins;Stacking coins    Stacking coins  Patient dropping items out  of ulnar side of hand - aware when she heard object hit table.  Patient did best with demonstration and minimal verbal cues.               OT Education - 11/11/18 1444    Education Details  coordination HEP    Person(s) Educated  Patient;Spouse    Methods  Explanation;Demonstration;Handout    Comprehension  Need further instruction;Verbal cues required;Tactile cues required       OT Short Term Goals - 11/11/18 1449      OT SHORT TERM GOAL #1   Title  Patient will dress her upper body with modified independence due 11/26/18    Time  4    Period  Weeks    Status  On-going      OT SHORT TERM GOAL #2   Title  Patient will complete toilet hygiene with min assist    Time  4    Period  Weeks    Status  On-going      OT SHORT TERM GOAL #3   Title  Patient will pull up and down clothing for toileting with modified independence    Time  4    Period  Weeks    Status   On-going      OT SHORT TERM GOAL #4   Title  Patient will use two hands to cut food on plate as needed    Time  4    Period  Weeks    Status  On-going      OT SHORT TERM GOAL #5   Title  Patient will don / doff LB clothing with no more than min assist    Time  4    Period  Weeks    Status  On-going        OT Long Term Goals - 11/11/18 1450      OT LONG TERM GOAL #1   Title  Patient will complete a home activities program designed to improve functional use of right UE DUE 01/25/19    Time  12    Period  Weeks    Status  On-going      OT LONG TERM GOAL #2   Title  Patient will dress herself with modified independence    Time  12    Period  Weeks    Status  On-going      OT LONG TERM GOAL #3   Title  Patient will shower with modified independence    Time  12    Period  Weeks    Status  On-going      OT LONG TERM GOAL #4   Title  Patient will toilet with modified independence    Time  12    Period  Weeks    Status  On-going      OT LONG TERM GOAL #5   Title  Patient will reach into chest igh shelf to retrieve a lightweight object (~2lb)  with right hand    Time  12    Period  Weeks    Status  On-going      OT LONG TERM GOAL #6   Title  Patient will demonstrate 5 lb increase in grip strength in right hand    Baseline  10    Time  12    Period  Weeks    Status  On-going            Plan - 11/11/18 1445  Clinical Impression Statement  Reviewed OT goals with patient and husband.  Patient will benefit from some rote practice of ADL skills, husband will benefit from participating in these sessions as he willingly is too eager and too fast to help patient.  Today's session somewhat limited by abdominal pain.      Occupational Profile and client history currently impacting functional performance  wife, retired Acupuncturist deficits (Please refer to evaluation for details):  ADL's;IADL's;Leisure;Social Participation    Rehab Potential  Good     OT Frequency  2x / week    OT Duration  12 weeks    OT Treatment/Interventions  Self-care/ADL training;DME and/or AE instruction;Balance training;Aquatic Therapy;Therapeutic activities;Therapeutic exercise;Cognitive remediation/compensation;Neuromuscular education;Functional Mobility Training;Visual/perceptual remediation/compensation;Patient/family education    Plan  ADL -tasks - dynamic stand balance, functional use of right UE, Education with patient/husband [name] - may need rote practice    Clinical Decision Making  Multiple treatment options, significant modification of task necessary    Consulted and Agree with Plan of Care  Patient;Family member/caregiver    Family Member Consulted  [name] - husband       Patient will benefit from skilled therapeutic intervention in order to improve the following deficits and impairments:  Decreased coordination, Improper body mechanics, Decreased endurance, Decreased safety awareness, Decreased activity tolerance, Decreased knowledge of precautions, Decreased balance, Decreased knowledge of use of DME, Impaired UE functional use, Decreased cognition, Decreased mobility, Impaired vision/preception, Decreased strength, Impaired tone, Impaired sensation  Visit Diagnosis: Apraxia  Hemiplegia and hemiparesis following cerebral infarction affecting right dominant side (HCC)  Muscle weakness (generalized)  Unsteadiness on feet  Cognitive social or emotional deficit following cerebral infarction  Abnormal posture    Problem List Patient Active Problem List   Diagnosis Date Noted  . Aphasia as late effect of stroke 11/08/2018  . 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 Left middle cerebral artery stroke ([place]) 10/06/2018  . Dyslipidemia   . Diabetes mellitus type 2 in nonobese (HCC)   . Atrial fibrillation ([place])   . Tachypnea   . Stage 3 chronic kidney disease ([place])   . Stroke [facility]) ischemic embolic L MCA  d/t AF 33/74/4514  . Middle cerebral artery  embolism, left 10/03/2018  . Frequent falls 10/13/2017  . Vitamin D deficiency 04/22/2017  . Slurring of speech 03/31/2017  . Back pain 09/14/2016  . Compression fracture of thoracic vertebra (HCC) 09/14/2016  . S/P vertebroplasty 09/14/2016  . Chronic diastolic heart failure ([place]) 09/14/2016  . HTN (hypertension) 09/14/2016  . History of CVA (cerebrovascular accident) 09/14/2016  . Hypokalemia 09/11/2016  . Paraspinal hematoma 09/11/2016  . Osteoporosis 05/27/2016  . Hypothyroidism 09/11/2014  . Encounter for therapeutic drug monitoring 11/28/2013  . Persistent atrial fibrillation ([place]) 12/15/2012  . HLD (hyperlipidemia) 03/27/2011    Mariah Milling, OTR/L 11/11/2018, 2:53 PM  [facility] [address] [place], [place], [address] Phone: [phone]   Fax:  [phone]  Name: Justyce Yeater MRN: [id number] Date of Birth: 03/09/1930

## 2018-11-11 NOTE — Therapy (Signed)
[facility] [address] [place], [place], [address] Phone: [phone]   Fax:  [phone]  Physical Therapy Treatment  Patient Details  Name: [name] MRN: [id number] Date of Birth: 09/01/1930 Referring Provider (PT): Claudette Laws, MD   Encounter Date: 11/11/2018  PT End of Session - 11/11/18 1413    Visit Number  2    Number of Visits  25    Date for PT Re-Evaluation  01/24/19    Authorization Type  Medicare & AARP  100% covered;  follow Medicare guidelines    PT Start Time  1410    PT Stop Time  1436   Pt not feeling well.    PT Time Calculation (min)  26 min    Equipment Utilized During Treatment  Gait belt    Activity Tolerance  Patient tolerated treatment well    Behavior During Therapy  Flat affect       Past Medical History:  Diagnosis Date  . Atrial fibrillation (HCC)   . Chronic anticoagulation   . Chronic anticoagulation   . CVA (cerebrovascular accident) (HCC)   . Diabetes mellitus   . History of chicken pox   . Hyperlipidemia   . Hypertension     Past Surgical History:  Procedure Laterality Date  . APPENDECTOMY    . BACK SURGERY    . CARDIAC CATHETERIZATION  11/09/91   EF 70%  . DILATION AND CURETTAGE OF UTERUS    . IR PERCUTANEOUS ART THROMBECTOMY/INFUSION INTRACRANIAL INC DIAG ANGIO  10/03/2018  . RADIOLOGY WITH ANESTHESIA N/A 10/03/2018   Procedure: IR WITH ANESTHESIA;  Surgeon: Radiologist, Medication, MD;  [facility];  Service: Radiology;  Laterality: N/A;    There were no vitals filed for this visit.  Subjective Assessment - 11/11/18 1409    Subjective  Pt presents with abdominal pain today, spouse spoke with doctor and is waiting for a call back, vitals taken with OT and are WNL. No falls to report.     Patient is accompained by:  Family member    Pertinent History  HTN, DM, CVA 91yrs ago, A-Fib, chronic LBP    Limitations  Lifting;Standing;Walking;House  hold activities    Patient Stated Goals  To talk better, to walk & balance better.    Currently in Pain?  Yes    Pain Score  7     Pain Location  Abdomen    Pain Orientation  Upper;Mid;Lower    Pain Descriptors / Indicators  Aching;Sore    Pain Type  Acute pain    Pain Onset  1 to 4 weeks ago    Pain Frequency  Constant    Aggravating Factors   Standing    Pain Relieving Factors  Laying down, sitting        [facility] Adult PT Treatment/Exercise - 11/11/18 1436      Exercises   Exercises  Ankle      Knee/Hip Exercises: Seated   Long Arc Quad  Both;2 sets;10 reps;AROM    Marching  AROM;Strengthening;Both;2 sets;10 reps    Abduction/Adduction   AROM;Strengthening;Both;2 sets;10 reps      Ankle Exercises: Seated   Heel Raises  Both;10 reps    Toe Raise  10 reps         PT Education - 11/11/18 1423    Education Details  Initiated seated HEP.    Person(s) Educated  Patient;Spouse    Methods  Explanation;Demonstration;Tactile cues;Verbal cues;Handout    Comprehension  Verbalized understanding;Returned demonstration;Verbal cues required;Tactile cues required;Need further instruction       PT Short Term Goals - 10/26/18 1449      PT SHORT TERM GOAL #1   Title  Patient's husband verbalizes fall risk prevention strategies. (All STGs Target Date: 11/25/2018)    Time  1    Period  Months    Status  New    Target Date  11/25/18      PT SHORT TERM GOAL #2   Title  patient ambulates 100' around furniture with RW with supervision.     Time  1    Period  Months    Status  New    Target Date  11/25/18      PT SHORT TERM GOAL #3   Title  Patient sit to/from stand from chairs with armrests without touching external support to stabilize with supervision.     Time  1    Period  Months    Status  New    Target Date  11/25/18      PT SHORT TERM GOAL #4   Title  Patient & husband demonstrate understanding of initial HEP.     Time  1    Period  Months    Status  New    Target Date   11/25/18        PT Long Term Goals - 10/27/18 1008      PT LONG TERM GOAL #1   Title  Patient & husband verbalize understanding of fall prevention strategies including appropriate assistive device. (All LTGs Target Date: 01/20/2019)    Time  12    Period  Weeks    Status  New    Target Date  01/20/19      PT LONG TERM GOAL #2   Title  Berg Balance >36/56 to indicate lower fall risk.     Time  12    Period  Weeks    Status  New    Target Date  01/20/19      PT LONG TERM GOAL #3   Title  Timed Up & Go with LRAD modified independent <13.5 seconds    Time  12    Period  Weeks    Status  New    Target Date  01/20/19      PT LONG TERM GOAL #4   Title  Patient ambulates 100' around furniture with LRAD with no balance losses with supervision for cognitive deficits only to enable safe mobility within her home without assistance.     Time  12    Period  Weeks    Status  New    Target Date  01/20/19      PT LONG TERM GOAL #5   Title  Patient ambulates 400' outdoors on paved surfaces with LRAD with supervision for community mobility.     Time  12    Period  Weeks    Status  New    Target Date  01/20/19      Additional Long Term Goals   Additional Long Term Goals  Yes      PT LONG TERM GOAL #6   Title  Patient negotiates ramps, curbs & 2 steps similar to home entrance with LRAD with supervision for safe community access.    Time  12    Period  Weeks    Status  New    Target Date  01/20/19      PT LONG TERM GOAL #7  Title  Patient and husband demonstrate & verbalize understanding of ongoing HEP & fitness plan.     Time  12    Period  Weeks    Status  New    Target Date  01/20/19        Plan - 11/11/18 1440    Clinical Impression Statement  Todays skilled session focused on initiating BLE strengthening HEP, pt presented with increased abdominal pain prior to and throughout session and asked to rest prior to speech appointment. Pt should benefit from continued PT  sessions to progress towards goals.     Rehab Potential  Good    PT Frequency  2x / week    PT Duration  12 weeks    PT Treatment/Interventions  ADLs/Self Care Home Management;DME Instruction;Gait training;Stair training;Functional mobility training;Therapeutic activities;Therapeutic exercise;Balance training;Neuromuscular re-education;Patient/family education;Vestibular    PT Next Visit Plan  establish HEP for balance    PT Home Exercise Plan  [id number]     Consulted and Agree with Plan of Care  Patient;Family member/caregiver    Family Member Consulted  husband, Aura Fey       Patient will benefit from skilled therapeutic intervention in order to improve the following deficits and impairments:  Abnormal gait, Decreased activity tolerance, Decreased balance, Decreased cognition, Decreased coordination, Decreased endurance, Decreased knowledge of use of DME, Decreased mobility, Decreased strength, Dizziness, Postural dysfunction, Pain  Visit Diagnosis: Aphasia  Cognitive communication deficit  Muscle weakness (generalized)  Unsteadiness on feet  Other abnormalities of gait and mobility     Problem List Patient Active Problem List   Diagnosis Date Noted  . Aphasia as late effect of stroke 11/08/2018  . 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 Left middle cerebral artery stroke (HCC) 10/06/2018  . Dyslipidemia   . Diabetes mellitus type 2 in nonobese (HCC)   . Atrial fibrillation (HCC)   . Tachypnea   . Stage 3 chronic kidney disease (HCC)   . Stroke [facility]) ischemic embolic L MCA  d/t AF 10/03/2018  . 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 Frequent falls 10/13/2017  . Vitamin D deficiency 04/22/2017  . Slurring of speech 03/31/2017  . Back pain 09/14/2016  . Compression fracture of thoracic vertebra (HCC) 09/14/2016  . S/P vertebroplasty 09/14/2016  . Chronic diastolic heart failure (HCC) 09/14/2016  . HTN (hypertension) 09/14/2016  . History of CVA  (cerebrovascular accident) 09/14/2016  . Hypokalemia 09/11/2016  . Paraspinal hematoma 09/11/2016  . Osteoporosis 05/27/2016  . Hypothyroidism 09/11/2014  . Encounter for therapeutic drug monitoring 11/28/2013  . Persistent atrial fibrillation (HCC) 12/15/2012  . HLD (hyperlipidemia) 03/27/2011   Chassity Felts, PTA  Chassity A Felts 11/11/2018, 2:43 PM   [facility] [address] [place], [place], [address] Phone: [phone]   Fax:  [phone]  Name: [name] MRN: [id number] Date of Birth: 04-01-1930

## 2018-11-11 NOTE — Therapy (Signed)
[facility] [address] [place], [place], [address] Phone: [phone]   Fax:  [phone]  Speech Language Pathology Treatment  Patient Details  Name: [name] MRN: [id number] Date of Birth: 06/18/1930 Referring Provider (SLP): Dr. Wynn Banker   Encounter Date: 11/11/2018  End of Session - 11/11/18 1741    Visit Number  5    Number of Visits  25    Date for SLP Re-Evaluation  01/25/19    Authorization Type  MCR    Authorization Time Period  90 days    SLP Start Time  1452    SLP Stop Time   1530    SLP Time Calculation (min)  38 min    Activity Tolerance  Patient limited by fatigue   at end of session (laste 5-7 mintues)      Past Medical History:  Diagnosis Date  . Atrial fibrillation (HCC)   . Chronic anticoagulation   . Chronic anticoagulation   . CVA (cerebrovascular accident) (HCC)   . Diabetes mellitus   . History of chicken pox   . Hyperlipidemia   . Hypertension     Past Surgical History:  Procedure Laterality Date  . APPENDECTOMY    . BACK SURGERY    . CARDIAC CATHETERIZATION  11/09/91   EF 70%  . DILATION AND CURETTAGE OF UTERUS    . IR PERCUTANEOUS ART THROMBECTOMY/INFUSION INTRACRANIAL INC DIAG ANGIO  10/03/2018  . RADIOLOGY WITH ANESTHESIA N/A 10/03/2018   Procedure: IR WITH ANESTHESIA;  Surgeon: Radiologist, Medication, MD;  Location: [facility];  Service: Radiology;  Laterality: N/A;    There were no vitals filed for this visit.  Subjective Assessment - 11/11/18 1505    Subjective  "No."    Currently in Pain?  Yes    Pain Score  7     Pain Location  Abdomen    Pain Orientation  Anterior;Upper;Mid;Lower    Pain Descriptors / Indicators  Aching    Pain Type  Acute pain    Pain Onset  1 to 4 weeks ago    Pain Frequency  Constant            ADULT SLP TREATMENT - 11/11/18 1505      General Information   Behavior/Cognition  Alert;Cooperative;Pleasant mood;Requires  cueing      Treatment Provided   Treatment provided  Cognitive-Linquistic      Cognitive-Linquistic Treatment   Treatment focused on  Aphasia;Apraxia    Skilled Treatment  SLP reviewed family names with max A/total A. SLP put family names in different context (e.g., "Thayer Ohm" was written "Kriss", "Marlene Bast" was written "May--sun", "porter" is picture of a hotel porter) and pt accuracy incr'd to approx 40% in limited trials. SLP encouraged pt's husband to practice this way with pt. Interestingly, when pt saw picture of Hale Bogus , and then of [employer], after correct productions her productions reverted to aphasic/ apraxic responses.      Assessment / Recommendations / Plan   Plan  Continue with current plan of care      Progression Toward Goals   Progression toward goals  Progressing toward goals         SLP Short Term Goals - 11/11/18 1742      SLP SHORT TERM GOAL #1   Title  Pt will demo awareness of 50% of errors in a functional task with usual mod A (verbal/written cues).     Time  5    Period  Weeks    Status  On-going      SLP SHORT TERM GOAL #2   Title  Pt will communicate preferences or needs (food, activities, medical etc) x 3 sessions with usual mod A (AAC if necessary).    Time  5    Period  Weeks    Status  On-going      SLP SHORT TERM GOAL #3   Title  Pt will communicate personal details (medical history, family, name, age, etc) with usual mod A x 3 sessions (AAC if necessary)    Time  5    Period  Weeks    Status  On-going       SLP Long Term Goals - 11/11/18 1743      SLP LONG TERM GOAL #1   Title  Pt will attempt error correction in 3/5 opportunities when cued appropriately by communication partner x 3 sessions.    Time  11    Period  Weeks    Status  On-going      SLP LONG TERM GOAL #2   Title  Family will demo understanding of at least 4 appropriate ways to enhance pt's comprehension/expression during 4 sessions.     Time  11    Period  Weeks    Status   On-going      SLP LONG TERM GOAL #3   Title  Pt will reduce social isolation risks by communicating social introductions, common social messages, and personal interests using multimodal communication with occasional min A from communication partner over 4 sessions.    Time  11    Period  Weeks    Status  New      SLP LONG TERM GOAL #4   Title  Pt/family will report improvement in understanding of pt's needs and requests than prior to ST.    Time  11    Period  Weeks    Status  On-going       Plan - 11/11/18 1742    Clinical Impression Statement  Patient presents with what is most likely severe expressive aphasia, with expressive > receptive deficits. Pt had islands of 3-4 word fluent, common/everyday speech during the session today. Husband continues to learn how to assist pt with SGD at home, and pt continues to work on expressive language ability and use of SGD. I recommend cont'd skilled ST to address maximize communication and for communication partner training/AAC for wants/needs, safety, independence and QOL.    Speech Therapy Frequency  2x / week    Duration  --   12 weeks or 25 visits   Treatment/Interventions  Cognitive reorganization;Multimodal communcation approach;Compensatory strategies;Language facilitation;Compensatory techniques;Cueing hierarchy;Internal/external aids;Functional tasks;SLP instruction and feedback;Patient/family education    Potential to Achieve Goals  Good    Potential Considerations  Cooperation/participation level;Severity of impairments;Ability to learn/carryover information    Consulted and Agree with Plan of Care  Patient;Family member/caregiver    Family Member Consulted  Husband Rocky Link       Patient will benefit from skilled therapeutic intervention in order to improve the following deficits and impairments:   Aphasia  Apraxia  Cognitive communication deficit    Problem List Patient Active Problem List   Diagnosis Date Noted  . Aphasia as  late effect of stroke 11/08/2018  . 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 Left middle cerebral artery stroke (HCC) 10/06/2018  . Dyslipidemia   . Diabetes mellitus type 2 in nonobese (HCC)   . Atrial fibrillation (HCC)   . Tachypnea   .  Stage 3 chronic kidney disease (HCC)   . Stroke [facility]) ischemic embolic L MCA  d/t AF 10/03/2018  . 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 Frequent falls 10/13/2017  . Vitamin D deficiency 04/22/2017  . Slurring of speech 03/31/2017  . Back pain 09/14/2016  . Compression fracture of thoracic vertebra (HCC) 09/14/2016  . S/P vertebroplasty 09/14/2016  . Chronic diastolic heart failure (HCC) 09/14/2016  . HTN (hypertension) 09/14/2016  . History of CVA (cerebrovascular accident) 09/14/2016  . Hypokalemia 09/11/2016  . Paraspinal hematoma 09/11/2016  . Osteoporosis 05/27/2016  . Hypothyroidism 09/11/2014  . Encounter for therapeutic drug monitoring 11/28/2013  . Persistent atrial fibrillation (HCC) 12/15/2012  . HLD (hyperlipidemia) 03/27/2011    [facility] ServicesCHINKE,Darnel Mchan ,MS, CCC-SLP   11/11/2018, 5:44 PM  [facility] [address] [place], [place], [address] Phone: [phone]   Fax:  [phone]   Name: [name] MRN: [id number] Date of Birth: [birth year]

## 2018-11-11 NOTE — Patient Instructions (Signed)
  Coordination Activities  Perform the following activities for 5-10 minutes 1-2 times per day with right hand(s).   Rotate ball in fingertips (clockwise and counter-clockwise).  Flip cards 1 at a time as fast as you can.  Deal cards with your thumb (Hold deck in hand and push card off top with thumb).  Rotate card in hand (clockwise and counter-clockwise).  Shuffle cards.  Pick up coins, buttons, marbles, dried beans/pasta of different sizes and place in container.  Pick up coins and place in container or coin bank.  Pick up coins and stack.  Pick up coins one at a time until you get 5-10 in your hand, then move coins from palm to fingertips to stack one at a time.  Twirl pen between fingers.

## 2018-11-11 NOTE — Patient Instructions (Signed)
Access Code: [email or web address]  URL: [email or web address]  Date: 11/11/2018  Prepared by: Jonathon Jordan Felts   Exercises  Seated March with Ankle Weights at Foot - 10 reps - 1 sets - 1x daily - 7x weekly  Seated Long Arc Quad - 10 reps - 1 sets - 1x daily - 7x weekly  Seated Heel Raise - 10 reps - 1 sets - 1x daily - 7x weekly  Seated Toe Raise - 10 reps - 1 sets - 1x daily - 7x weekly  Seated Hip Abduction - 10 reps - 1 sets - 1x daily - 7x weekly

## 2018-11-15 ENCOUNTER — Ambulatory Visit: Payer: Medicare Other | Admitting: Occupational Therapy

## 2018-11-15 ENCOUNTER — Ambulatory Visit: Payer: Medicare Other | Admitting: Speech Pathology

## 2018-11-15 ENCOUNTER — Encounter: Payer: Self-pay | Admitting: Physical Therapy

## 2018-11-15 ENCOUNTER — Ambulatory Visit: Payer: Medicare Other | Admitting: Physical Therapy

## 2018-11-15 ENCOUNTER — Encounter: Payer: Self-pay | Admitting: Occupational Therapy

## 2018-11-15 VITALS — BP 156/69 | HR 49

## 2018-11-15 DIAGNOSIS — R4701 Aphasia: Secondary | ICD-10-CM | POA: Diagnosis not present

## 2018-11-15 DIAGNOSIS — Z9181 History of falling: Secondary | ICD-10-CM | POA: Diagnosis not present

## 2018-11-15 DIAGNOSIS — R2689 Other abnormalities of gait and mobility: Secondary | ICD-10-CM

## 2018-11-15 DIAGNOSIS — I69315 Cognitive social or emotional deficit following cerebral infarction: Secondary | ICD-10-CM

## 2018-11-15 DIAGNOSIS — I69351 Hemiplegia and hemiparesis following cerebral infarction affecting right dominant side: Secondary | ICD-10-CM

## 2018-11-15 DIAGNOSIS — R2681 Unsteadiness on feet: Secondary | ICD-10-CM

## 2018-11-15 DIAGNOSIS — R41841 Cognitive communication deficit: Secondary | ICD-10-CM

## 2018-11-15 DIAGNOSIS — M6281 Muscle weakness (generalized): Secondary | ICD-10-CM

## 2018-11-15 DIAGNOSIS — R482 Apraxia: Secondary | ICD-10-CM

## 2018-11-15 DIAGNOSIS — R293 Abnormal posture: Secondary | ICD-10-CM | POA: Diagnosis not present

## 2018-11-15 NOTE — Therapy (Signed)
[facility] [address] [place], [place], [address] Phone: [phone]   Fax:  [phone]  Occupational Therapy Treatment  Patient Details  Name: [name] MRN: [id number] Date of Birth: [birth year] No data recorded  Encounter Date: 11/15/2018  OT End of Session - 11/15/18 1659    Visit Number  3    Number of Visits  25    Date for OT Re-Evaluation  01/25/19    Authorization Type  Medicare and AARP supplement    Authorization - Visit Number  3    Authorization - Number of Visits  10    OT Start Time  1400    OT Stop Time  1445    OT Time Calculation (min)  45 min    Activity Tolerance  Patient tolerated treatment well    Behavior During Therapy  [facility] for tasks assessed/performed       Past Medical History:  Diagnosis Date  . Atrial fibrillation ([place])   . Chronic anticoagulation   . Chronic anticoagulation   . CVA (cerebrovascular accident) ([place])   . Diabetes mellitus   . History of chicken pox   . Hyperlipidemia   . Hypertension     Past Surgical History:  Procedure Laterality Date  . APPENDECTOMY    . BACK SURGERY    . CARDIAC CATHETERIZATION  11/09/91   EF 70%  . DILATION AND CURETTAGE OF UTERUS    . IR PERCUTANEOUS ART THROMBECTOMY/INFUSION INTRACRANIAL INC DIAG ANGIO  10/03/2018  . RADIOLOGY WITH ANESTHESIA N/A 10/03/2018   Procedure: IR WITH ANESTHESIA;  Surgeon: Radiologist, Medication, MD;  Location: [facility];  Service: Radiology;  Laterality: N/A;    There were no vitals filed for this visit.  Subjective Assessment - 11/15/18 1418    Subjective   Patient indicates she is going to the doctor tomorrow for abdominal pain    Patient is accompained by:  Family member    Currently in Pain?  Yes    Pain Score  3     Pain Location  Abdomen    Pain Orientation  Anterior;Upper;Mid;Lower    Pain Descriptors / Indicators  Aching    Pain Type  Chronic pain    Pain Onset  1 to 4 weeks ago     Pain Frequency  Constant    Aggravating Factors   bending                   OT Treatments/Exercises (OP) - 11/15/18 0001      ADLs   LB Dressing  Patient able to don and doff pants, socks and shoes today without assitance.  Husband present and stating, I think I help her too much sometimes.  Discussed benefit of allowing patient to regain independence.  Patient did need add'l time and min assist to close belt buckle using BUE.        Neurological Re-education Exercises   Other Exercises 1  Neuromuscular reeducation to address stand balance.  Patient very fearful to stand without UE's in support.  Patient needed only minimal cueing to maintain weight through RLE in standing.  Patient fatigues quickly (~62mn) in standing and needs seated rest breaks.  Patient is more limtied by abdominal pain with prolonged standing than true weakness.  Patient able to retunr to standing with 2-3 min seated rest.  Worked on functional ambulation - patient managing door while walking with walker.  Patent looks to husband to help initially, but with  cueing, willing to work toward independence.      Other Exercises 2  Working on strengthening of right hand - graded clothespins first in standing - weight shift to right, then in sitting during "rest break"               OT Education - 11/15/18 1659    Education Details  importance of patient completing ADL's with less assistance    Person(s) Educated  Patient;Spouse    Methods  Explanation;Demonstration    Comprehension  Verbalized understanding;Returned demonstration       OT Short Term Goals - 11/15/18 1701      OT SHORT TERM GOAL #1   Title  Patient will dress her upper body with modified independence due 11/26/18    Time  4    Period  Weeks    Status  On-going      OT SHORT TERM GOAL #2   Title  Patient will complete toilet hygiene with min assist    Time  4    Period  Weeks    Status  On-going      OT SHORT TERM GOAL #3   Title   Patient will pull up and down clothing for toileting with modified independence    Time  4    Period  Weeks    Status  Achieved      OT SHORT TERM GOAL #4   Title  Patient will use two hands to cut food on plate as needed    Time  4    Period  Weeks    Status  On-going      OT SHORT TERM GOAL #5   Title  Patient will don / doff LB clothing with no more than min assist    Time  4    Period  Weeks    Status  Achieved        OT Long Term Goals - 11/11/18 1450      OT LONG TERM GOAL #1   Title  Patient will complete a home activities program designed to improve functional use of right UE DUE 01/25/19    Time  12    Period  Weeks    Status  On-going      OT LONG TERM GOAL #2   Title  Patient will dress herself with modified independence    Time  12    Period  Weeks    Status  On-going      OT LONG TERM GOAL #3   Title  Patient will shower with modified independence    Time  12    Period  Weeks    Status  On-going      OT LONG TERM GOAL #4   Title  Patient will toilet with modified independence    Time  12    Period  Weeks    Status  On-going      OT LONG TERM GOAL #5   Title  Patient will reach into chest igh shelf to retrieve a lightweight object (~2lb)  with right hand    Time  12    Period  Weeks    Status  On-going      OT LONG TERM GOAL #6   Title  Patient will demonstrate 5 lb increase in grip strength in right hand    Baseline  10    Time  12    Period  Weeks    Status  On-going  Plan - 11/15/18 1659    Clinical Impression Statement  Patient showing greater particiaption in her own self care.  Patient somewhat limited by abdominal pain during standing and bending tasks.   Patient to follow up with MD regarding pain tomorrow.      Occupational Profile and client history currently impacting functional performance  wife, retired Acupuncturist deficits (Please refer to evaluation for details):   ADL's;IADL's;Leisure;Social Participation    Rehab Potential  Good    OT Frequency  2x / week    OT Duration  12 weeks    OT Treatment/Interventions  Self-care/ADL training;DME and/or AE instruction;Balance training;Aquatic Therapy;Therapeutic activities;Therapeutic exercise;Cognitive remediation/compensation;Neuromuscular education;Functional Mobility Training;Visual/perceptual remediation/compensation;Patient/family education    Plan  stand tolerance, static to slightly dynamic stand balance, functional use of RUE - mid to high reach    Clinical Decision Making  Multiple treatment options, significant modification of task necessary    Consulted and Agree with Plan of Care  Patient;Family member/caregiver    Family Member Consulted  [name] - husband       Patient will benefit from skilled therapeutic intervention in order to improve the following deficits and impairments:  Decreased coordination, Improper body mechanics, Decreased endurance, Decreased safety awareness, Decreased activity tolerance, Decreased knowledge of precautions, Decreased balance, Decreased knowledge of use of DME, Impaired UE functional use, Decreased cognition, Decreased mobility, Impaired vision/preception, Decreased strength, Impaired tone, Impaired sensation  Visit Diagnosis: Hemiplegia and hemiparesis following cerebral infarction affecting right dominant side (HCC)  Muscle weakness (generalized)  Unsteadiness on feet  Apraxia  Abnormal posture  Cognitive social or emotional deficit following cerebral infarction    Problem List Patient Active Problem List   Diagnosis Date Noted  . Aphasia as late effect of stroke 11/08/2018  . 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 Left middle cerebral artery stroke ([place]) 10/06/2018  . Dyslipidemia   . Diabetes mellitus type 2 in nonobese (HCC)   . Atrial fibrillation ([place])   . Tachypnea   . Stage 3 chronic kidney disease ([place])   . Stroke [facility]) ischemic embolic L MCA  d/t  AF 41/96/2229  . 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 Frequent falls 10/13/2017  . Vitamin D deficiency 04/22/2017  . Slurring of speech 03/31/2017  . Back pain 09/14/2016  . Compression fracture of thoracic vertebra (HCC) 09/14/2016  . S/P vertebroplasty 09/14/2016  . Chronic diastolic heart failure ([place]) 09/14/2016  . HTN (hypertension) 09/14/2016  . History of CVA (cerebrovascular accident) 09/14/2016  . Hypokalemia 09/11/2016  . Paraspinal hematoma 09/11/2016  . Osteoporosis 05/27/2016  . Hypothyroidism 09/11/2014  . Encounter for therapeutic drug monitoring 11/28/2013  . Persistent atrial fibrillation ([place]) 12/15/2012  . HLD (hyperlipidemia) 03/27/2011    Mariah Milling, OTR/L 11/15/2018, 5:02 PM  [facility] [address] [place], [place], [address] Phone: [phone]   Fax:  [phone]  Name: Etter Royall MRN: [id number] Date of Birth: Feb 20, 1930

## 2018-11-16 DIAGNOSIS — M549 Dorsalgia, unspecified: Secondary | ICD-10-CM | POA: Diagnosis not present

## 2018-11-16 DIAGNOSIS — I69998 Other sequelae following unspecified cerebrovascular disease: Secondary | ICD-10-CM | POA: Diagnosis not present

## 2018-11-16 DIAGNOSIS — I480 Paroxysmal atrial fibrillation: Secondary | ICD-10-CM | POA: Diagnosis not present

## 2018-11-16 DIAGNOSIS — E039 Hypothyroidism, unspecified: Secondary | ICD-10-CM | POA: Diagnosis not present

## 2018-11-16 DIAGNOSIS — I10 Essential (primary) hypertension: Secondary | ICD-10-CM | POA: Diagnosis not present

## 2018-11-16 DIAGNOSIS — K5909 Other constipation: Secondary | ICD-10-CM | POA: Diagnosis not present

## 2018-11-16 DIAGNOSIS — Z6820 Body mass index (BMI) 20.0-20.9, adult: Secondary | ICD-10-CM | POA: Diagnosis not present

## 2018-11-16 NOTE — Therapy (Signed)
[facility] [address] [place], [place], [address] Phone: [phone]   Fax:  [phone]  Speech Language Pathology Treatment  Patient Details  Name: [name] MRN: [id number] Date of Birth: 1930-05-05 Referring Provider (SLP): Dr. Wynn Banker   Encounter Date: 11/15/2018  End of Session - 11/16/18 1449    Visit Number  6    Number of Visits  25    Date for SLP Re-Evaluation  01/25/19    Authorization Type  MCR    Authorization Time Period  90 days    SLP Start Time  1448    SLP Stop Time   1545    SLP Time Calculation (min)  57 min    Activity Tolerance  Patient tolerated treatment well       Past Medical History:  Diagnosis Date  . Atrial fibrillation (HCC)   . Chronic anticoagulation   . Chronic anticoagulation   . CVA (cerebrovascular accident) (HCC)   . Diabetes mellitus   . History of chicken pox   . Hyperlipidemia   . Hypertension     Past Surgical History:  Procedure Laterality Date  . APPENDECTOMY    . BACK SURGERY    . CARDIAC CATHETERIZATION  11/09/91   EF 70%  . DILATION AND CURETTAGE OF UTERUS    . IR PERCUTANEOUS ART THROMBECTOMY/INFUSION INTRACRANIAL INC DIAG ANGIO  10/03/2018  . RADIOLOGY WITH ANESTHESIA N/A 10/03/2018   Procedure: IR WITH ANESTHESIA;  Surgeon: Radiologist, Medication, MD;  Location: [facility];  Service: Radiology;  Laterality: N/A;    There were no vitals filed for this visit.  Subjective Assessment - 11/15/18 1448    Subjective  "We're getting it checked tomorrow"     Patient is accompained by:  Family member   Rocky Link, husband   Currently in Pain?  Yes    Pain Score  5     Pain Location  Abdomen            ADULT SLP TREATMENT - 11/15/18 1445      General Information   Behavior/Cognition  Alert;Cooperative;Pleasant mood;Requires cueing      Treatment Provided   Treatment provided  Cognitive-Linquistic      Cognitive-Linquistic Treatment   Treatment focused on  Aphasia;Apraxia    Skilled Treatment  Pt, husband arrrived with [employer] device. Husband requested SLP instruction for how to create/add photos to icons. SLP demo'd with existing photos taken of pt's bathroom. Husband able to return demonstration with occasional min A, creating icons of pt's night cream in the "Bathroom" section, and adding photos of family members. SLP demo'd using the device in a communicative context. Pt told SLP about her work and marriage with usual mod-max A for awareness of paraphasias/jargon, though she did have several correct spontaneous sentences. SLP demo'd adding personal details to pt's "Me" section on her device. Afterwards, pt shared the personal details with occasional mod A to select/touch vs point at icons. Husband had several questions about pt's speech and how to "get her to say it right." SLP educated that given nature of deficits, having pt repeat non-functional/low-frequency words is likely to be frustrating and unlikely to help pt generalize. Explained that pt being able to communicate her message (via any means necessary) is the focus vs correct verbal productions. Educated husband/pt that developing cuing strategies/script training for high-frequency or high-interest words (such as family member names) is appropriate and they should continue to practice these at home.  Assessment / Recommendations / Plan   Plan  Continue with current plan of care      Progression Toward Goals   Progression toward goals  Progressing toward goals       SLP Education - 11/16/18 1448    Education Details  appropriate cuing strategies, focus on message vs "correct" production    Person(s) Educated  Patient;Spouse    Methods  Explanation;Demonstration;Verbal cues    Comprehension  Verbalized understanding;Need further instruction;Verbal cues required       SLP Short Term Goals - 11/16/18 1451      SLP SHORT TERM GOAL #1   Title  Pt will demo  awareness of 50% of errors in a functional task with usual mod A (verbal/written cues).     Time  4    Period  Weeks    Status  On-going      SLP SHORT TERM GOAL #2   Title  Pt will communicate preferences or needs (food, activities, medical etc) x 3 sessions with usual mod A (AAC if necessary).    Time  4    Period  Weeks    Status  On-going      SLP SHORT TERM GOAL #3   Title  Pt will communicate personal details (medical history, family, name, age, etc) with usual mod A x 3 sessions (AAC if necessary)    Time  4    Period  Weeks    Status  On-going       SLP Long Term Goals - 11/16/18 1452      SLP LONG TERM GOAL #1   Title  Pt will attempt error correction in 3/5 opportunities when cued appropriately by communication partner x 3 sessions.    Time  10    Period  Weeks    Status  On-going      SLP LONG TERM GOAL #2   Title  Family will demo understanding of at least 4 appropriate ways to enhance pt's comprehension/expression during 4 sessions.     Time  10    Period  Weeks    Status  On-going      SLP LONG TERM GOAL #3   Title  Pt will reduce social isolation risks by communicating social introductions, common social messages, and personal interests using multimodal communication with occasional min A from communication partner over 4 sessions.    Time  10    Period  Weeks    Status  On-going      SLP LONG TERM GOAL #4   Title  Pt/family will report improvement in understanding of pt's needs and requests than prior to ST.    Time  10    Period  Weeks    Status  On-going       Plan - 11/16/18 1449    Clinical Impression Statement  Patient presents with what is most likely severe expressive aphasia, with expressive > receptive deficits. Pt had several fluent spontaneous sentences, common/everyday speech during the session today. Husband continues to learn how to assist pt with SGD at home, and pt continues to work on expressive language ability and use of SGD. I  recommend cont'd skilled ST to address maximize communication and for communication partner training/AAC for wants/needs, safety, independence and QOL.    Speech Therapy Frequency  2x / week    Duration  --   12 weeks or 25 total visits   Treatment/Interventions  Cognitive reorganization;Multimodal communcation approach;Compensatory strategies;Language facilitation;Compensatory techniques;Cueing hierarchy;Internal/external aids;Functional  tasks;SLP instruction and feedback;Patient/family education    Potential to Achieve Goals  Good    Potential Considerations  Cooperation/participation level;Severity of impairments;Ability to learn/carryover information       Patient will benefit from skilled therapeutic intervention in order to improve the following deficits and impairments:   Aphasia  Cognitive communication deficit    Problem List Patient Active Problem List   Diagnosis Date Noted  . Aphasia as late effect of stroke 11/08/2018  . 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 Left middle cerebral artery stroke (HCC) 10/06/2018  . Dyslipidemia   . Diabetes mellitus type 2 in nonobese (HCC)   . Atrial fibrillation (HCC)   . Tachypnea   . Stage 3 chronic kidney disease (HCC)   . Stroke [facility]) ischemic embolic L MCA  d/t AF 10/03/2018  . 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 Frequent falls 10/13/2017  . Vitamin D deficiency 04/22/2017  . Slurring of speech 03/31/2017  . Back pain 09/14/2016  . Compression fracture of thoracic vertebra (HCC) 09/14/2016  . S/P vertebroplasty 09/14/2016  . Chronic diastolic heart failure (HCC) 09/14/2016  . HTN (hypertension) 09/14/2016  . History of CVA (cerebrovascular accident) 09/14/2016  . Hypokalemia 09/11/2016  . Paraspinal hematoma 09/11/2016  . Osteoporosis 05/27/2016  . Hypothyroidism 09/11/2014  . Encounter for therapeutic drug monitoring 11/28/2013  . Persistent atrial fibrillation (HCC) 12/15/2012  . HLD (hyperlipidemia)  03/27/2011   Rondel Baton, MS, CCC-SLP Speech-Language Pathologist  Arlana Lindau 11/16/2018, 2:53 PM  [facility] [address] [place], [place], [address] Phone: [phone]   Fax:  [phone]   Name: [name] MRN: [id number] Date of Birth: July 29, 1930

## 2018-11-16 NOTE — Therapy (Signed)
[facility] [address] [place], [place], [address] Phone: [phone]   Fax:  [phone]  Physical Therapy Treatment  Patient Details  Name: [name] MRN: [id number] Date of Birth: [birth year] Referring Provider (PT): Claudette Laws, MD   Encounter Date: 11/15/2018  PT End of Session - 11/15/18 1800    Visit Number  3    Number of Visits  25    Date for PT Re-Evaluation  01/24/19    Authorization Type  Medicare & AARP  100% covered;  follow Medicare guidelines    PT Start Time  1315    PT Stop Time  1400    PT Time Calculation (min)  45 min    Equipment Utilized During Treatment  Gait belt    Activity Tolerance  Patient tolerated treatment well    Behavior During Therapy  Flat affect       Past Medical History:  Diagnosis Date  . Atrial fibrillation (HCC)   . Chronic anticoagulation   . Chronic anticoagulation   . CVA (cerebrovascular accident) (HCC)   . Diabetes mellitus   . History of chicken pox   . Hyperlipidemia   . Hypertension     Past Surgical History:  Procedure Laterality Date  . APPENDECTOMY    . BACK SURGERY    . CARDIAC CATHETERIZATION  11/09/91   EF 70%  . DILATION AND CURETTAGE OF UTERUS    . IR PERCUTANEOUS ART THROMBECTOMY/INFUSION INTRACRANIAL INC DIAG ANGIO  10/03/2018  . RADIOLOGY WITH ANESTHESIA N/A 10/03/2018   Procedure: IR WITH ANESTHESIA;  Surgeon: Radiologist, Medication, MD;  Location: [facility] OR;  Service: Radiology;  Laterality: N/A;    Vitals:   11/15/18 1334  BP: (!) 156/69  Pulse: (!) 49    Subjective Assessment - 11/15/18 1315    Subjective  No falls. She is not using RW in bathroom because is too small.     Patient is accompained by:  Family member    Pertinent History  HTN, DM, CVA 38yrs ago, A-Fib, chronic LBP    Limitations  Lifting;Standing;Walking;House hold activities    Patient Stated Goals  To talk better, to walk & balance better.    Pain Onset  1 to 4 weeks ago      Patient ambulated from lobby to gym with RW with PT facilitating conversation & pt scanning. She lost balance to right requiring minA.   PT reviewed HEP & added standing kicks & sit to/from stand. Husband verbalized understanding. Access Code: [email or web address]  URL: [email or web address]  Date: 11/15/2018  Prepared by: Vladimir Faster   Exercises .   Seated March with Ankle Weights at Foot - 10 reps - 1 sets - 1x daily - 7x weekly .   Seated Long Arc Quad - 10 reps - 1 sets - 1x daily - 7x weekly .   Seated Heel Raise - 10 reps - 1 sets - 1x daily - 7x weekly .   Seated Toe Raise - 10 reps - 1 sets - 1x daily - 7x weekly .   Seated Hip Abduction - 10 reps - 1 sets - 1x daily - 7x weekly .   Standing 3-way Hip with Walker - 10 reps - 3 sets - 5 seconds hold - 1x daily - 7x weekly .   Sit to Stand - 5 reps - 3 sets - 5 seconds hold - 1x daily - 7x weekly  PT Education - 11/15/18 1400    Education Details  reviewed seated HEP & add standing kicks & sit to stand    Person(s) Educated  Patient;Spouse    Methods  Explanation;Demonstration;Tactile cues;Verbal cues;Handout    Comprehension  Verbalized understanding;Returned demonstration;Verbal cues required;Tactile cues required;Need further instruction       PT Short Term Goals - 10/26/18 1449      PT SHORT TERM GOAL #1   Title  Patient's husband verbalizes fall risk prevention strategies. (All STGs Target Date: 11/25/2018)    Time  1    Period  Months    Status  New    Target Date  11/25/18      PT SHORT TERM GOAL #2   Title  patient ambulates 100' around furniture with RW with supervision.     Time  1    Period  Months    Status  New    Target Date  11/25/18      PT SHORT TERM GOAL #3   Title  Patient sit to/from stand from chairs with armrests without touching external support to stabilize with supervision.     Time  1    Period  Months     Status  New    Target Date  11/25/18      PT SHORT TERM GOAL #4   Title  Patient & husband demonstrate understanding of initial HEP.     Time  1    Period  Months    Status  New    Target Date  11/25/18        PT Long Term Goals - 10/27/18 1008      PT LONG TERM GOAL #1   Title  Patient & husband verbalize understanding of fall prevention strategies including appropriate assistive device. (All LTGs Target Date: 01/20/2019)    Time  12    Period  Weeks    Status  New    Target Date  01/20/19      PT LONG TERM GOAL #2   Title  Berg Balance >36/56 to indicate lower fall risk.     Time  12    Period  Weeks    Status  New    Target Date  01/20/19      PT LONG TERM GOAL #3   Title  Timed Up & Go with LRAD modified independent <13.5 seconds    Time  12    Period  Weeks    Status  New    Target Date  01/20/19      PT LONG TERM GOAL #4   Title  Patient ambulates 100' around furniture with LRAD with no balance losses with supervision for cognitive deficits only to enable safe mobility within her home without assistance.     Time  12    Period  Weeks    Status  New    Target Date  01/20/19      PT LONG TERM GOAL #5   Title  Patient ambulates 400' outdoors on paved surfaces with LRAD with supervision for community mobility.     Time  12    Period  Weeks    Status  New    Target Date  01/20/19      Additional Long Term Goals   Additional Long Term Goals  Yes      PT LONG TERM GOAL #6   Title  Patient negotiates ramps, curbs & 2 steps similar to home entrance with  LRAD with supervision for safe community access.    Time  12    Period  Weeks    Status  New    Target Date  01/20/19      PT LONG TERM GOAL #7   Title  Patient and husband demonstrate & verbalize understanding of ongoing HEP & fitness plan.     Time  12    Period  Weeks    Status  New    Target Date  01/20/19            Plan - 11/15/18 2017    Clinical Impression Statement  Patient and husband  appear to understand updated HEP.     Rehab Potential  Good    PT Frequency  2x / week    PT Duration  12 weeks    PT Treatment/Interventions  ADLs/Self Care Home Management;DME Instruction;Gait training;Stair training;Functional mobility training;Therapeutic activities;Therapeutic exercise;Balance training;Neuromuscular re-education;Patient/family education;Vestibular    PT Next Visit Plan  instruct in fall prevention strategies, balance activities & gait with RW scanning environment & conversing    PT Home Exercise Plan  [email or web address]     Consulted and Agree with Plan of Care  Patient;Family member/caregiver    Family Member Consulted  husband, Aura FeyKenneth Martinez       Patient will benefit from skilled therapeutic intervention in order to improve the following deficits and impairments:  Abnormal gait, Decreased activity tolerance, Decreased balance, Decreased cognition, Decreased coordination, Decreased endurance, Decreased knowledge of use of DME, Decreased mobility, Decreased strength, Dizziness, Postural dysfunction, Pain  Visit Diagnosis: Hemiplegia and hemiparesis following cerebral infarction affecting right dominant side (HCC)  Muscle weakness (generalized)  Unsteadiness on feet  Apraxia  Abnormal posture  Other abnormalities of gait and mobility     Problem List Patient Active Problem List   Diagnosis Date Noted  . Aphasia as late effect of stroke 11/08/2018  . 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 Left middle cerebral artery stroke (HCC) 10/06/2018  . Dyslipidemia   . Diabetes mellitus type 2 in nonobese (HCC)   . Atrial fibrillation (HCC)   . Tachypnea   . Stage 3 chronic kidney disease (HCC)   . Stroke [facility]) ischemic embolic L MCA  d/t AF 10/03/2018  . 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 Frequent falls 10/13/2017  . Vitamin D deficiency 04/22/2017  . Slurring of speech 03/31/2017  . Back pain 09/14/2016  . Compression fracture of thoracic vertebra  (HCC) 09/14/2016  . S/P vertebroplasty 09/14/2016  . Chronic diastolic heart failure (HCC) 09/14/2016  . HTN (hypertension) 09/14/2016  . History of CVA (cerebrovascular accident) 09/14/2016  . Hypokalemia 09/11/2016  . Paraspinal hematoma 09/11/2016  . Osteoporosis 05/27/2016  . Hypothyroidism 09/11/2014  . Encounter for therapeutic drug monitoring 11/28/2013  . Persistent atrial fibrillation (HCC) 12/15/2012  . HLD (hyperlipidemia) 03/27/2011    Shylah Dossantos PT, DPT 11/16/2018, 10:26 AM  [facility] [address] [place], [place], [address] Phone: [phone]   Fax:  [phone]  Name: [name] MRN: [id number] Date of Birth: 1930/05/25

## 2018-11-17 ENCOUNTER — Encounter: Payer: Self-pay | Admitting: Neurology

## 2018-11-17 ENCOUNTER — Ambulatory Visit (INDEPENDENT_AMBULATORY_CARE_PROVIDER_SITE_OTHER): Payer: Medicare Other | Admitting: Neurology

## 2018-11-17 VITALS — BP 147/80 | HR 51 | Ht 59.0 in | Wt 113.0 lb

## 2018-11-17 DIAGNOSIS — I69320 Aphasia following cerebral infarction: Secondary | ICD-10-CM | POA: Insufficient documentation

## 2018-11-17 DIAGNOSIS — I69359 Hemiplegia and hemiparesis following cerebral infarction affecting unspecified side: Secondary | ICD-10-CM | POA: Diagnosis not present

## 2018-11-17 DIAGNOSIS — I69398 Other sequelae of cerebral infarction: Secondary | ICD-10-CM | POA: Insufficient documentation

## 2018-11-17 DIAGNOSIS — I63412 Cerebral infarction due to embolism of left middle cerebral artery: Secondary | ICD-10-CM | POA: Diagnosis not present

## 2018-11-17 DIAGNOSIS — I63512 Cerebral infarction due to unspecified occlusion or stenosis of left middle cerebral artery: Secondary | ICD-10-CM | POA: Diagnosis not present

## 2018-11-17 DIAGNOSIS — R4701 Aphasia: Secondary | ICD-10-CM | POA: Diagnosis not present

## 2018-11-17 DIAGNOSIS — R269 Unspecified abnormalities of gait and mobility: Secondary | ICD-10-CM

## 2018-11-17 NOTE — Progress Notes (Signed)
[facility]    Provider:  Dr Lucia GaskinsAhern Referring Provider: Wylene Simmerisovec, Adelfa Kohichard W, MD Primary Care Provider:  Gaspar Garbeisovec, Richard W, MD  CC:  Embolic stroke due to afib  HPI:  [name] is a 85 y.o. female here as requested by provider Tisovec, Adelfa Kohichard W, MD for stroke. She has PMI of hypertension, hyperlipidemia,diet controlleddiabetes mellitus, CVA 10 years ago on chronic anticoagulation secondary to atrial fibrillation followed by Dr. Duke Salviaandolph cardiology services, and chronic back pain.    She underwent kyphoplasty the end of November, 2019. The day following she had sudden onset of confusion and balance concerns (leaning to one side) that resolved within hours after restarting warfarin. She was seen by her cardiologist who referred her to neurology.   On 12/16 she was [facility] with acute confusion, word salad and progressive right sided weakness. She was found to have a left posterior branch MCA infarct. She was not a candidate for tPA due to taking warfarin w/ INR 2.5. Premorbid modified Rankin scale (mRS):4. NIH stroke scale15.Revasculazation of M2 occlusion attempted by Dr Corliss Skainseveshwar but was unsuccessful.  Echocardiogram with ejection fraction of 60%. Systolic function was normal. Neurology follow-upplan was for INR to be less than 2.00and then begin ELIQUIS in place o f Coumadin. She was admitted to inpatient rehab on 12/19.  She is here with husband who also provides much information and appears to be quite frustrated, discussed unfortunately medications are not 100% effective and we do see failures of anticoagulation.  I encouraged continued therapy.  She does continue to have some trouble with her balance and speech. She is having more trouble getting her words out. She seems to be comprehending well. She denies falls. She has PT, OT and ST three times a week with neuro rehab.   She had a previous stroke in 2010.   NIH 2 and Modified Rankin  3   Reviewed notes, labs and imaging from outside physicians, which showed:  Personally reviewed MRI brain images and agree:  10/04/28: Acute infarct left MCA territory. Largest area infarction is in the left parietal lobe with extension into the insula and left frontal lobe. No associated hemorrhage  Atrophy and chronic ischemic changes as above.  Review of Systems: Patient complains of symptoms per HPI as well as the following symptoms: aphasia, difficulty walking, weakness. Pertinent negatives and positives per HPI. All others negative.  IBR 2.17 on admission  Social History   Socioeconomic History  . Marital status: Married    Spouse name: Not on file  . Number of children: 2  . Years of education: Not on file  . Highest education level: High school graduate  Occupational History  . Not on file  Social Needs  . Financial resource strain: Not on file  . Food insecurity:    Worry: Not on file    Inability: Not on file  . Transportation needs:    Medical: Not on file    Non-medical: Not on file  Tobacco Use  . Smoking status: Former Smoker    Last attempt to quit: 10/19/1998    Years since quitting: 20.0  . Smokeless tobacco: Never Used  Substance and Sexual Activity  . Alcohol use: No  . Drug use: No  . Sexual activity: Not on file  Lifestyle  . Physical activity:    Days per week: Not on file    Minutes per session: Not on file  . Stress: Not on file  Relationships  .  Social connections:    Talks on phone: Not on file    Gets together: Not on file    Attends religious service: Not on file    Active member of club or organization: Not on file    Attends meetings of clubs or organizations: Not on file    Relationship status: Not on file  . Intimate partner violence:    Fear of current or ex partner: Not on file    Emotionally abused: Not on file    Physically abused: Not on file    Forced sexual activity: Not on file  Other Topics Concern  . Not on  file  Social History Narrative   Lives at home with her husband    Right handed   Caffeine: sometimes coffee    Family History  Problem Relation Age of Onset  . Heart disease Mother   . Hypertension Mother   . Stroke Mother   . Heart disease Father   . Heart failure Brother   . Osteoporosis Neg Hx     Past Medical History:  Diagnosis Date  . Atrial fibrillation (HCC)   . Chronic anticoagulation   . Chronic anticoagulation   . CVA (cerebrovascular accident) (HCC)   . Diabetes mellitus    pt & husband deny any knowledge of this   . History of chicken pox   . Hyperlipidemia   . Hypertension     Patient Active Problem List   Diagnosis Date Noted  . Expressive aphasia 11/17/2018  . Gait disturbance, post-stroke 11/17/2018  . Aphasia as late effect of stroke 11/08/2018  . 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 Left middle cerebral artery stroke (HCC) 10/06/2018  . Dyslipidemia   . Diabetes mellitus type 2 in nonobese (HCC)   . Atrial fibrillation (HCC)   . Tachypnea   . Stage 3 chronic kidney disease (HCC)   . Stroke [facility]) ischemic embolic L MCA  d/t AF 10/03/2018  . 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 Frequent falls 10/13/2017  . Vitamin D deficiency 04/22/2017  . Slurring of speech 03/31/2017  . Back pain 09/14/2016  . Compression fracture of thoracic vertebra (HCC) 09/14/2016  . S/P vertebroplasty 09/14/2016  . Chronic diastolic heart failure (HCC) 09/14/2016  . HTN (hypertension) 09/14/2016  . History of CVA (cerebrovascular accident) 09/14/2016  . Hypokalemia 09/11/2016  . Paraspinal hematoma 09/11/2016  . Osteoporosis 05/27/2016  . Hypothyroidism 09/11/2014  . Encounter for therapeutic drug monitoring 11/28/2013  . Persistent atrial fibrillation (HCC) 12/15/2012  . HLD (hyperlipidemia) 03/27/2011    Past Surgical History:  Procedure Laterality Date  . APPENDECTOMY    . BACK SURGERY    . CARDIAC CATHETERIZATION  11/09/91   EF 70%  .  DILATION AND CURETTAGE OF UTERUS    . IR PERCUTANEOUS ART THROMBECTOMY/INFUSION INTRACRANIAL INC DIAG ANGIO  10/03/2018  . RADIOLOGY WITH ANESTHESIA N/A 10/03/2018   Procedure: IR WITH ANESTHESIA;  Surgeon: Radiologist, Medication, MD;  Location: [facility] OR;  Service: Radiology;  Laterality: N/A;    Current Outpatient Medications  Medication Sig Dispense Refill  . acetaminophen (TYLENOL) 325 MG tablet Take 2 tablets (650 mg total) by mouth every 4 (four) hours as needed for mild pain (or temp > 37.5 C (99.5 F)).    Marland Kitchen amLODipine (NORVASC) 5 MG tablet Take 1 tablet (5 mg total) by mouth daily. 90 tablet 3  . apixaban (ELIQUIS) 2.5 MG TABS tablet Take 1 tablet (2.5 mg total) by mouth 2 (two) times daily. (Patient  taking differently: Take 5 mg by mouth 2 (two) times daily. ) 60 tablet 1  . atorvastatin (LIPITOR) 20 MG tablet Take 0.5 tablets (10 mg total) by mouth daily. 30 tablet 0  . levothyroxine (SYNTHROID, LEVOTHROID) 25 MCG tablet Take 1 tablet (25 mcg total) by mouth daily before breakfast. 30 tablet 0  . nadolol (CORGARD) 20 MG tablet Take 0.5 tablets (10 mg total) by mouth daily. 30 tablet 1  . nitroGLYCERIN (NITROSTAT) 0.4 MG SL tablet Place 0.4 mg under the tongue every 5 (five) minutes as needed (chest pain).      No current facility-administered medications for this visit.     Allergies as of 11/17/2018 - Review Complete 11/17/2018  Allergen Reaction Noted  . Cozaar Other (See Comments) 03/25/2011  . Hydralazine Other (See Comments) 03/25/2011  . Hyzaar [losartan potassium-hctz] Other (See Comments) 07/21/2012  . Sulfa drugs cross reactors Other (See Comments) 03/25/2011  . Lisinopril  03/25/2011    Vitals: BP (!) 147/80 (BP Location: Left Arm, Patient Position: Sitting)   Pulse (!) 51   Ht 4\' 11"  (1.499 m)   Wt 113 lb (51.3 kg)   BMI 22.82 kg/m  Last Weight:  Wt Readings from Last 1 Encounters:  11/17/18 113 lb (51.3 kg)   Last Height:   Ht Readings from Last 1  Encounters:  11/17/18 4\' 11"  (1.499 m)     Physical exam: Exam: Gen: NAD                   CV: irregular, no MRG. No Carotid Bruits. No peripheral edema, warm, nontender Eyes: Conjunctivae clear without exudates or hemorrhage  Neuro: Detailed Neurologic Exam  Speech:    She has expressive greater than receptive aphasia, paraphasic errors, she can follow simple commands Cognition:    The patient is oriented to person, place, and time;     recent and remote memory impaired;     language aphasic;     normal attention, concentration,  fund of knowledge Cranial Nerves:    The pupils are equal, round, and reactive to light.  Attempted funduscopic exam could not visualize due to small pupils.  Visual fields are full to finger confrontation. Extraocular movements are intact. Trigeminal sensation is intact and the muscles of mastication are normal. The face is symmetric. The palate elevates in the midline. Hearing intact. Voice is normal. Shoulder shrug is normal. The tongue has normal motion without fasciculations.   Coordination:    No dysmetria or consistent with weakness  Gait:    Can get up out of chair and use a walker effectively  Motor Observation:    No asymmetry, no atrophy, and no involuntary movements noted. Tone:    Normal muscle tone.    Posture:    Slightly stooped    Strength: She has mild right-sided hemiparesis which is improved since being in the hospital possibly 3+/5.  Strength appears intact on the left side.     Sensation: intact to LT     Reflex Exam:  DTR's:    Deep tendon reflexes in the upper and lower symmetrical are normal bilaterally.   Toes:    The toes are equivocal bilaterally.   Clonus:    Clonus is absent.    Assessment/Plan: This is a lovely 85 year old female with a past medical history of A. fib on Coumadin, stroke 10 years ago without residual defects, diabetes, hypertension, hyperlipidemia who was admitted for aphasia and right-sided  weakness, who unfortunately had embolic left-sided  MCA strokes secondary to A. fib while properly anticoagulated on warfarin.  She was switched to Eliquis.  MRI with left MCA multiple infarcts secondary to left M2 occlusion.  Remote right cerebellar infarct. She also has left subclavian 50% stenosis. Echocardiogram showed normal ejection fraction She underwent unsuccessful intervention LDL and hemoglobin A1c within normal limits, controlled She was therapeutic on her warfarin, was changed to Eliquis. Still discussed aggressive stroke risk factor management follow-up with primary care She went to inpatient rehab and is now receiving rehabilitation at home including speech, OT and PT They would like to see Dr. Pearlean Brownie at next appointment. Continue current medications and therapy  Cc: Tisovec, Adelfa Koh, MD,    Naomie Dean, MD  [facility] [address] [place], [place] [address]  Phone [phone] Fax [phone]

## 2018-11-17 NOTE — Patient Instructions (Addendum)
Continue PT/OT/ST Follow up with Dr Pearlean Brownie for next visit  Stroke Prevention Some medical conditions and lifestyle choices can lead to a higher risk for a stroke. You can help to prevent a stroke by making nutrition, lifestyle, and other changes. What nutrition changes can be made?   Eat healthy foods. ? Choose foods that are high in fiber. These include:  Fresh fruits.  Fresh vegetables.  Whole grains. ? Eat at least 5 or more servings of fruits and vegetables each day. Try to fill half of your plate at each meal with fruits and vegetables. ? Choose lean protein foods. These include:  Lowfat (lean) cuts of meat.  Chicken without skin.  Fish.  Tofu.  Beans.  Nuts. ? Eat low-fat dairy products. ? Avoid foods that:  Are high in salt (sodium).  Have saturated fat.  Have trans fat.  Have cholesterol.  Are processed.  Are premade.  Follow eating guidelines as told by your doctor. These may include: ? Reducing how many calories you eat and drink each day. ? Limiting how much salt you eat or drink each day to 1,500 milligrams (mg). ? Using only healthy fats for cooking. These include:  Olive oil.  Canola oil.  Sunflower oil. ? Counting how many carbohydrates you eat and drink each day. What lifestyle changes can be made?  Try to stay at a healthy weight. Talk to your doctor about what a good weight is for you.  Get at least 30 minutes of moderate physical activity at least 5 days a week. This can include: ? Fast walking. ? Biking. ? Swimming.  Do not use any products that have nicotine or tobacco. This includes cigarettes and e-cigarettes. If you need help quitting, ask your doctor. Avoid being around tobacco smoke in general.  Limit how much alcohol you drink to no more than 1 drink a day for nonpregnant women and 2 drinks a day for men. One drink equals 12 oz of beer, 5 oz of wine, or 1 oz of hard liquor.  Do not use drugs.  Avoid taking birth control  pills. Talk to your doctor about the risks of taking birth control pills if: ? You are over 91 years old. ? You smoke. ? You get migraines. ? You have had a blood clot. What other changes can be made?  Manage your cholesterol. ? It is important to eat a healthy diet. ? If your cholesterol cannot be managed through your diet, you may also need to take medicines. Take medicines as told by your doctor.  Manage your diabetes. ? It is important to eat a healthy diet and to exercise regularly. ? If your blood sugar cannot be managed through diet and exercise, you may need to take medicines. Take medicines as told by your doctor.  Control your high blood pressure (hypertension). ? Try to keep your blood pressure below 130/80. This can help lower your risk of stroke. ? It is important to eat a healthy diet and to exercise regularly. ? If your blood pressure cannot be managed through diet and exercise, you may need to take medicines. Take medicines as told by your doctor. ? Ask your doctor if you should check your blood pressure at home. ? Have your blood pressure checked every year. Do this even if your blood pressure is normal.  Talk to your doctor about getting checked for a sleep disorder. Signs of this can include: ? Snoring a lot. ? Feeling very tired.  Take over-the-counter  and prescription medicines only as told by your doctor. These may include aspirin or blood thinners (antiplatelets or anticoagulants).  Make sure that any other medical conditions you have are managed. Where to find more information  American Stroke Association: www.strokeassociation.org  National Stroke Association: www.stroke.org Get help right away if:  You have any symptoms of stroke. "BE FAST" is an easy way to remember the main warning signs: ? B - Balance. Signs are dizziness, sudden trouble walking, or loss of balance. ? E - Eyes. Signs are trouble seeing or a sudden change in how you see. ? F - Face.  Signs are sudden weakness or loss of feeling of the face, or the face or eyelid drooping on one side. ? A - Arms. Signs are weakness or loss of feeling in an arm. This happens suddenly and usually on one side of the body. ? S - Speech. Signs are sudden trouble speaking, slurred speech, or trouble understanding what people say. ? T - Time. Time to call emergency services. Write down what time symptoms started.  You have other signs of stroke, such as: ? A sudden, very bad headache with no known cause. ? Feeling sick to your stomach (nausea). ? Throwing up (vomiting). ? Jerky movements you cannot control (seizure). These symptoms may represent a serious problem that is an emergency. Do not wait to see if the symptoms will go away. Get medical help right away. Call your local emergency services (911 in the [place] Do not drive yourself to the hospital. Summary  You can prevent a stroke by eating healthy, exercising, not smoking, drinking less alcohol, and treating other health problems, such as diabetes, high blood pressure, or high cholesterol.  Do not use any products that contain nicotine or tobacco, such as cigarettes and e-cigarettes.  Get help right away if you have any signs or symptoms of a stroke. This information is not intended to replace advice given to you by your health care provider. Make sure you discuss any questions you have with your health care provider. Document Released: 04/05/2012 Document Revised: 01/06/2017 Document Reviewed: 01/06/2017 Elsevier Interactive Patient Education  2019 [employer].    Aphasia Aphasia is a language disorder that affects the part of your brain that you use to communicate. Aphasia does not affect your intelligence, but you may have trouble:  Speaking.  Understanding speech.  Reading.  Writing. Some people with aphasia may also have trouble with memory or attention. Aphasia can happen to anyone at any age, but it is most common in  older adults. What are the causes? This condition is caused by damage to the language centers of the brain. Damage may be caused by:  Stroke. This causes a disruption of blood flow to certain areas of the brain. Stroke is the most common cause of aphasia.  Traumatic brain injury (TBI).  Brain tumor.  Infection of the brain tissues.  Nervous system disease that gradually gets worse (progressiveneurological disorder), such as dementia or multiple sclerosis (MS).  Brain surgery. What are the signs or symptoms? Symptoms of this condition include:  Trouble finding the right words.  Difficulty expressing thoughts and needs through speech.  Using the wrong words, nonsense words, or jargon.  Talking in sentences that do not make sense or that are not grammatically correct.  Being unable to repeat back words and phrases.  Difficulty expressing ideas through writing.  Difficulty comprehending when reading.  Being unable to understand other people's speech.  Having trouble understanding numbers.  The condition affects people differently. Symptoms may start suddenly or come on gradually, depending on the underlying cause. How is this diagnosed? This condition may be diagnosed based on a screening of your ability to communicate as soon as symptoms start, or are medically stable after a stroke or brain injury. Later, a more comprehensive assessment may be conducted either in the hospital or rehabilitation center. The assessment may test your ability to:  Use speech to communicate your personal needs.  Use muscles in your mouth and throat for speaking and swallowing.  Express ideas with speech or other means of communication, such as hand gestures.  Make conversation with others across a variety of topics.  Hear and understand speech.  Understand and produce written material.  Manage memory and attention associated with communication. How is this treated? Treatment for this  condition depends on your needs and abilities. The goal is to help restore your ability to communicate or find ways to manage communication challenges. Common treatments include:  Speech-language therapy. Part of this may include: ? Re-building intonation, sentence structure, and vocabulary. ? Learning other ways to communicate, such as using word books, communication boards, or special software programs. ? Learning to communicate with writing, sign language, or hand gestures. ? Using a combination of methods to communicate.  Working with family members. This may include: ? Learning ways to communicate. ? Emotional support.  Support groups.  Occupational therapy. This can help to find devices to assist with daily living activities. Treatment usually begins as soon as possible. It may begin while you are in the hospital and continue in a rehabilitation center or at home. In some cases, aphasia may improve quickly on its own. In other cases, recovery occurs more slowly over time. Follow these instructions at home:   Keep all follow-up visits as told by your health care provider. This is important.  Make sure you have a good support system at home.  Find a support group. This can help you connect with others who are going through the same thing.  Try the following tips while communicating: ? Use short, simple sentences. Ask family members to do the same. Sentences that require one-word or short answers are easiest. ? Avoid distractions like background noise when trying to listen or talk. ? Try communicating with gestures, pointing, writing, or drawing. ? Talk slowly. Ask family members to talk to you slowly. ? Maintain eye contact when communicating. Ask family members to do the same when communicating with you. ? Ask family members to give you time to respond or to engage in conversation with them. Contact a health care provider if:  Your symptoms change or get worse.  You are  struggling with anxiety or depression. Get help right away if you have:  Any symptoms of a stroke. "BE FAST" is an easy way to remember the main warning signs of a stroke: ? B - Balance. Signs are dizziness, sudden trouble walking, or loss of balance. ? E - Eyes. Signs are trouble seeing or a sudden change in vision. ? F - Face. Signs are a sudden weakness or numbness of the face, or the face or eyelid drooping on one side. ? A - Arms. Signs are weakness or numbness in an arm. This happens suddenly and usually on one side of the body. ? S - Speech. Signs are sudden trouble speaking, slurred speech, or trouble understanding what people say. ? T - Time. Time to call emergency service. Write down what time symptoms  started.  Other signs of a stroke, such as: ? A sudden, severe headache with no known cause. ? Nausea or vomiting. ? Seizure. Summary  Aphasia is a language disorder that results from damage to the part of your brain that helps you use language to communicate. Aphasia does not affect your intelligence, but may cause difficulty with speech, writing, reading, or understanding others.  In some cases, aphasia may improve quickly on its own. In other cases, recovery occurs more slowly over time.  Get help right away if you have symptoms of a stroke. This information is not intended to replace advice given to you by your health care provider. Make sure you discuss any questions you have with your health care provider. Document Released: 06/27/2002 Document Revised: 11/02/2017 Document Reviewed: 11/02/2017   Fall Prevention in the Home, Adult Falls can cause injuries. They can happen to people of all ages. There are many things you can do to make your home safe and to help prevent falls. Ask for help when making these changes, if needed. What actions can I take to prevent falls? General Instructions  Use good lighting in all rooms. Replace any light bulbs that burn out.  Turn on the  lights when you go into a dark area. Use night-lights.  Keep items that you use often in easy-to-reach places. Lower the shelves around your home if necessary.  Set up your furniture so you have a clear path. Avoid moving your furniture around.  Do not have throw rugs and other things on the floor that can make you trip.  Avoid walking on wet floors.  If any of your floors are uneven, fix them.  Add color or contrast paint or tape to clearly mark and help you see: ? Any grab bars or handrails. ? First and last steps of stairways. ? Where the edge of each step is.  If you use a stepladder: ? Make sure that it is fully opened. Do not climb a closed stepladder. ? Make sure that both sides of the stepladder are locked into place. ? Ask someone to hold the stepladder for you while you use it.  If there are any pets around you, be aware of where they are. What can I do in the bathroom?      Keep the floor dry. Clean up any water that spills onto the floor as soon as it happens.  Remove soap buildup in the tub or shower regularly.  Use non-skid mats or decals on the floor of the tub or shower.  Attach bath mats securely with double-sided, non-slip rug tape.  If you need to sit down in the shower, use a plastic, non-slip stool.  Install grab bars by the toilet and in the tub and shower. Do not use towel bars as grab bars. What can I do in the bedroom?  Make sure that you have a light by your bed that is easy to reach.  Do not use any sheets or blankets that are too big for your bed. They should not hang down onto the floor.  Have a firm chair that has side arms. You can use this for support while you get dressed. What can I do in the kitchen?  Clean up any spills right away.  If you need to reach something above you, use a strong step stool that has a grab bar.  Keep electrical cords out of the way.  Do not use floor polish or wax that makes floors  slippery. If you must  use wax, use non-skid floor wax. What can I do with my stairs?  Do not leave any items on the stairs.  Make sure that you have a light switch at the top of the stairs and the bottom of the stairs. If you do not have them, ask someone to add them for you.  Make sure that there are handrails on both sides of the stairs, and use them. Fix handrails that are broken or loose. Make sure that handrails are as long as the stairways.  Install non-slip stair treads on all stairs in your home.  Avoid having throw rugs at the top or bottom of the stairs. If you do have throw rugs, attach them to the floor with carpet tape.  Choose a carpet that does not hide the edge of the steps on the stairway.  Check any carpeting to make sure that it is firmly attached to the stairs. Fix any carpet that is loose or worn. What can I do on the outside of my home?  Use bright outdoor lighting.  Regularly fix the edges of walkways and driveways and fix any cracks.  Remove anything that might make you trip as you walk through a door, such as a raised step or threshold.  Trim any bushes or trees on the path to your home.  Regularly check to see if handrails are loose or broken. Make sure that both sides of any steps have handrails.  Install guardrails along the edges of any raised decks and porches.  Clear walking paths of anything that might make someone trip, such as tools or rocks.  Have any leaves, snow, or ice cleared regularly.  Use sand or salt on walking paths during winter.  Clean up any spills in your garage right away. This includes grease or oil spills. What other actions can I take?  Wear shoes that: ? Have a low heel. Do not wear high heels. ? Have rubber bottoms. ? Are comfortable and fit you well. ? Are closed at the toe. Do not wear open-toe sandals.  Use tools that help you move around (mobility aids) if they are needed. These  include: ? Canes. ? Walkers. ? Scooters. ? Crutches.  Review your medicines with your doctor. Some medicines can make you feel dizzy. This can increase your chance of falling. Ask your doctor what other things you can do to help prevent falls. Where to find more information  Centers for Disease Control and Prevention, [employer]AD[employer]: HealthcareCounselor.com.pthttps://cdc.gov  [employer]: RingConnections.sihttps://go4life.nia.nih.gov Contact a doctor if:  You are afraid of falling at home.  You feel weak, drowsy, or dizzy at home.  You fall at home. Summary  There are many simple things that you can do to make your home safe and to help prevent falls.  Ways to make your home safe include removing tripping hazards and installing grab bars in the bathroom.  Ask for help when making these changes in your home. This information is not intended to replace advice given to you by your health care provider. Make sure you discuss any questions you have with your health care provider. Document Released: 08/01/2009 Document Revised: 05/20/2017 Document Reviewed: 05/20/2017 Elsevier Interactive Patient Education  2019 [employer]  [employer] analystlsevier Interactive Patient Education  [employer]2019 Elsevier Inc.

## 2018-11-17 NOTE — Progress Notes (Signed)
Scribe for dr Lucia Gaskins

## 2018-11-18 ENCOUNTER — Ambulatory Visit: Payer: Medicare Other | Admitting: Occupational Therapy

## 2018-11-18 ENCOUNTER — Ambulatory Visit: Payer: Medicare Other

## 2018-11-18 DIAGNOSIS — M6281 Muscle weakness (generalized): Secondary | ICD-10-CM

## 2018-11-18 DIAGNOSIS — I69315 Cognitive social or emotional deficit following cerebral infarction: Secondary | ICD-10-CM

## 2018-11-18 DIAGNOSIS — R2681 Unsteadiness on feet: Secondary | ICD-10-CM | POA: Diagnosis not present

## 2018-11-18 DIAGNOSIS — I69351 Hemiplegia and hemiparesis following cerebral infarction affecting right dominant side: Secondary | ICD-10-CM | POA: Diagnosis not present

## 2018-11-18 DIAGNOSIS — R4701 Aphasia: Secondary | ICD-10-CM

## 2018-11-18 DIAGNOSIS — R41841 Cognitive communication deficit: Secondary | ICD-10-CM

## 2018-11-18 DIAGNOSIS — R2689 Other abnormalities of gait and mobility: Secondary | ICD-10-CM | POA: Diagnosis not present

## 2018-11-18 DIAGNOSIS — Z9181 History of falling: Secondary | ICD-10-CM | POA: Diagnosis not present

## 2018-11-18 DIAGNOSIS — R293 Abnormal posture: Secondary | ICD-10-CM | POA: Diagnosis not present

## 2018-11-18 NOTE — Therapy (Signed)
[facility] [address] [place], [place], [address] Phone: [phone]   Fax:  [phone]  Physical Therapy Treatment  Patient Details  Name: Janice LargeCarolyn W White Reichard MRN: [id number] Date of Birth: 11-09-1929 Referring Provider (PT): Claudette LawsAndrew Kirsteins, MD   Encounter Date: 11/18/2018  PT End of Session - 11/18/18 1407    Visit Number  4    Number of Visits  25    Date for PT Re-Evaluation  01/24/19    Authorization Type  Medicare & AARP  100% covered;  follow Medicare guidelines    PT Start Time  1401    PT Stop Time  1445    PT Time Calculation (min)  44 min    Equipment Utilized During Treatment  Gait belt    Activity Tolerance  Patient tolerated treatment well    Behavior During Therapy  Flat affect       Past Medical History:  Diagnosis Date  . Atrial fibrillation (HCC)   . Chronic anticoagulation   . Chronic anticoagulation   . CVA (cerebrovascular accident) (HCC)   . Diabetes mellitus    pt & husband deny any knowledge of this   . History of chicken pox   . Hyperlipidemia   . Hypertension     Past Surgical History:  Procedure Laterality Date  . APPENDECTOMY    . BACK SURGERY    . CARDIAC CATHETERIZATION  11/09/91   EF 70%  . DILATION AND CURETTAGE OF UTERUS    . IR PERCUTANEOUS ART THROMBECTOMY/INFUSION INTRACRANIAL INC DIAG ANGIO  10/03/2018  . RADIOLOGY WITH ANESTHESIA N/A 10/03/2018   Procedure: IR WITH ANESTHESIA;  Surgeon: Radiologist, Medication, MD;  Location: [facility];  Service: Radiology;  Laterality: N/A;    There were no vitals filed for this visit.  Subjective Assessment - 11/18/18 1404    Subjective  No falls to report, HEP is going well. Pt C/O of pain in abdomen today. Spouse stated that they had appt with PCP and had a scan of abdoment area and are waiting for results.     Patient is accompained by:  Family member   Husband   Pertinent History  HTN, DM, CVA 5792yrs ago, A-Fib,  chronic LBP    Limitations  Lifting;Standing;Walking;House hold activities    Patient Stated Goals  To talk better, to walk & balance better.    Currently in Pain?  Yes    Pain Score  7     Pain Location  Abdomen    Pain Orientation  Lower    Pain Descriptors / Indicators  Discomfort;Aching    Pain Frequency  Intermittent    Pain Relieving Factors  rest        [facility] Adult PT Treatment/Exercise - 11/18/18 1408      Transfers   Transfers  Sit to Stand;Stand to Sit    Sit to Stand  5: Supervision    Sit to Stand Details (indicate cue type and reason)  From mat progressing from BUE to no UE support required, therapist then placed blue disc under bottom and pt required SUE support due to instability.     Stand to Sit  5: Supervision    Stand to Sit Details  VC's for controlled descent. Pt easily fatigue and continued to C/O abdomen pain throughout.     Number of Reps  10 reps;2 sets      Ambulation/Gait   Ambulation/Gait  Yes    Ambulation/Gait Assistance  4: Min  guard    Ambulation/Gait Assistance Details  Attempted gait while scanning performing head nods/turns with max VC's for instruction and moderate fatigue.     Ambulation Distance (Feet)  375 Feet   115   Assistive device  Rolling walker;None    Gait Pattern  Step-to pattern;Decreased arm swing - right;Decreased step length - left;Decreased stance time - right;Decreased weight shift to right;Right flexed knee in stance;Decreased hip/knee flexion - right;Antalgic;Lateral hip instability;Abducted- right    Ambulation Surface  Level;Indoor    Gait Comments  Pt ambulated 115' around gym with HHA, min instability and mod fatigue with C/O of pain in abdomen while walking.       Neuro Re-ed    Neuro Re-ed Details   Pt seated on mat performing weighted ball toss with therapist on rolling stool moving in all directions to initiated core stability and seated balance. 2 rest break required.          PT Education - 11/18/18 1453     Education Details  Educated pt/spouse in fall prevention strategies in home setting.     Person(s) Educated  Patient;Spouse    Methods  Explanation;Handout    Comprehension  Verbalized understanding;Need further instruction       PT Short Term Goals - 10/26/18 1449      PT SHORT TERM GOAL #1   Title  Patient's husband verbalizes fall risk prevention strategies. (All STGs Target Date: 11/25/2018)    Time  1    Period  Months    Status  New    Target Date  11/25/18      PT SHORT TERM GOAL #2   Title  patient ambulates 100' around furniture with RW with supervision.     Time  1    Period  Months    Status  New    Target Date  11/25/18      PT SHORT TERM GOAL #3   Title  Patient sit to/from stand from chairs with armrests without touching external support to stabilize with supervision.     Time  1    Period  Months    Status  New    Target Date  11/25/18      PT SHORT TERM GOAL #4   Title  Patient & husband demonstrate understanding of initial HEP.     Time  1    Period  Months    Status  New    Target Date  11/25/18        PT Long Term Goals - 10/27/18 1008      PT LONG TERM GOAL #1   Title  Patient & husband verbalize understanding of fall prevention strategies including appropriate assistive device. (All LTGs Target Date: 01/20/2019)    Time  12    Period  Weeks    Status  New    Target Date  01/20/19      PT LONG TERM GOAL #2   Title  Berg Balance >36/56 to indicate lower fall risk.     Time  12    Period  Weeks    Status  New    Target Date  01/20/19      PT LONG TERM GOAL #3   Title  Timed Up & Go with LRAD modified independent <13.5 seconds    Time  12    Period  Weeks    Status  New    Target Date  01/20/19      PT LONG  TERM GOAL #4   Title  Patient ambulates 100' around furniture with LRAD with no balance losses with supervision for cognitive deficits only to enable safe mobility within her home without assistance.     Time  12    Period  Weeks     Status  New    Target Date  01/20/19      PT LONG TERM GOAL #5   Title  Patient ambulates 400' outdoors on paved surfaces with LRAD with supervision for community mobility.     Time  12    Period  Weeks    Status  New    Target Date  01/20/19      Additional Long Term Goals   Additional Long Term Goals  Yes      PT LONG TERM GOAL #6   Title  Patient negotiates ramps, curbs & 2 steps similar to home entrance with LRAD with supervision for safe community access.    Time  12    Period  Weeks    Status  New    Target Date  01/20/19      PT LONG TERM GOAL #7   Title  Patient and husband demonstrate & verbalize understanding of ongoing HEP & fitness plan.     Time  12    Period  Weeks    Status  New    Target Date  01/20/19        Plan - 11/18/18 1454    Clinical Impression Statement  Todays skilled session focused on gait training while scanning performing head nods/turns, STS from compliant/non compliant surface, seated balance and education on fall prevention strategies. TX limited due to C/O of fatigue and pain in abdomen throughout session, pt required max cues for each task. Pt may benefit from continued PT sessions to progress towards goals.     Rehab Potential  Good    PT Frequency  2x / week    PT Duration  12 weeks    PT Treatment/Interventions  ADLs/Self Care Home Management;DME Instruction;Gait training;Stair training;Functional mobility training;Therapeutic activities;Therapeutic exercise;Balance training;Neuromuscular re-education;Patient/family education;Vestibular    PT Next Visit Plan  Results of scans? balance activities & gait with RW scanning environment & conversing    PT Home Exercise Plan  [id number]     Consulted and Agree with Plan of Care  Patient;Family member/caregiver    Family Member Consulted  husband, Aura Fey       Patient will benefit from skilled therapeutic intervention in order to improve the following deficits and impairments:  Abnormal  gait, Decreased activity tolerance, Decreased balance, Decreased cognition, Decreased coordination, Decreased endurance, Decreased knowledge of use of DME, Decreased mobility, Decreased strength, Dizziness, Postural dysfunction, Pain  Visit Diagnosis: Muscle weakness (generalized)  Unsteadiness on feet  Cognitive social or emotional deficit following cerebral infarction  Other abnormalities of gait and mobility  Cognitive communication deficit  Aphasia     Problem List Patient Active Problem List   Diagnosis Date Noted  . Expressive aphasia 11/17/2018  . Gait disturbance, post-stroke 11/17/2018  . Hemiparesis affecting dominant side as late effect of cerebrovascular accident (HCC) 11/17/2018  . Aphasia, post-stroke 11/17/2018  . Aphasia as late effect of stroke 11/08/2018  . 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 Left middle cerebral artery stroke (HCC) 10/06/2018  . Dyslipidemia   . Diabetes mellitus type 2 in nonobese (HCC)   . Atrial fibrillation (HCC)   . Tachypnea   . Stage 3 chronic kidney disease (HCC)   .  Stroke [facility](HCC) ischemic embolic L MCA  d/t AF 10/03/2018  . 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 Frequent falls 10/13/2017  . Vitamin D deficiency 04/22/2017  . Slurring of speech 03/31/2017  . Back pain 09/14/2016  . Compression fracture of thoracic vertebra (HCC) 09/14/2016  . S/P vertebroplasty 09/14/2016  . Chronic diastolic heart failure (HCC) 09/14/2016  . HTN (hypertension) 09/14/2016  . History of CVA (cerebrovascular accident) 09/14/2016  . Hypokalemia 09/11/2016  . Paraspinal hematoma 09/11/2016  . Osteoporosis 05/27/2016  . Hypothyroidism 09/11/2014  . Persistent atrial fibrillation (HCC) 12/15/2012  . HLD (hyperlipidemia) 03/27/2011    , PTA   A  11/18/2018, 2:57 PM  [facility] [address] [place], [place], [address] Phone: [phone]   Fax:   [phone]  Name: Janice LargeCarolyn W White Reichard MRN: [id number] Date of Birth: 10/17/1930

## 2018-11-18 NOTE — Therapy (Signed)
[facility] [address] [place], [place], [address] Phone: [phone]   Fax:  [phone]  Speech Language Pathology Treatment  Patient Details  Name: [name] MRN: [id number] Date of Birth: 1929-11-06 Referring Provider (SLP): Dr. Letta Pate   Encounter Date: 11/18/2018  End of Session - 11/18/18 1548    Visit Number  7    Number of Visits  25    Date for SLP Re-Evaluation  01/25/19    Authorization Type  MCR    Authorization Time Period  90 days    SLP Start Time  1447    SLP Stop Time   1535    SLP Time Calculation (min)  48 min    Activity Tolerance  Patient tolerated treatment well       Past Medical History:  Diagnosis Date  . Atrial fibrillation ([place])   . Chronic anticoagulation   . Chronic anticoagulation   . CVA (cerebrovascular accident) ([place])   . Diabetes mellitus    pt & husband deny any knowledge of this   . History of chicken pox   . Hyperlipidemia   . Hypertension     Past Surgical History:  Procedure Laterality Date  . APPENDECTOMY    . BACK SURGERY    . CARDIAC CATHETERIZATION  11/09/91   EF 70%  . DILATION AND CURETTAGE OF UTERUS    . IR PERCUTANEOUS ART THROMBECTOMY/INFUSION INTRACRANIAL INC DIAG ANGIO  10/03/2018  . RADIOLOGY WITH ANESTHESIA N/A 10/03/2018   Procedure: IR WITH ANESTHESIA;  Surgeon: Radiologist, Medication, MD;  Location: [facility];  Service: Radiology;  Laterality: N/A;    There were no vitals filed for this visit.  Subjective Assessment - 11/18/18 1454    Subjective  "You waiting on [place]?"    Patient is accompained by:  Family member   [name]   Currently in Pain?  Yes    Pain Score  7     Pain Location  Abdomen    Pain Orientation  Lower    Pain Descriptors / Indicators  Aching;Discomfort    Pain Onset  1 to 4 weeks ago    Pain Frequency  Intermittent            ADULT SLP TREATMENT - 11/18/18 0001      General Information   Behavior/Cognition  Alert;Cooperative;Pleasant mood;Requires cueing      Treatment Provided   Treatment provided  Cognitive-Linquistic      Cognitive-Linquistic Treatment   Treatment focused on  Aphasia;Apraxia    Skilled Treatment  "That crazy thangs call me from [place]." (re: how pt/husband met) Pt with some more spontaneous rote/everyday speech today (see "S"). SLP used speech generating device to encourage husband to have pt use this more at home. SLP reviewed foods, family, "me" page. SLP strongly encouraged husband to turn device on at home and begin talking about what is on the pages with pt in order to familiarize pt with device layout. When SLP repeated pt's word salad/jibberish to pt she showed awareness of errors approx 60% of the time.      Assessment / Recommendations / Plan   Plan  Continue with current plan of care      Progression Toward Goals   Progression toward goals  Progressing toward goals         SLP Short Term Goals - 11/18/18 1549      SLP SHORT TERM GOAL #1   Title  Pt will demo awareness  of 50% of errors in a functional task with usual mod A (verbal/written cues).     Baseline  11-18-18    Time  4    Period  Weeks    Status  On-going      SLP SHORT TERM GOAL #2   Title  Pt will communicate preferences or needs (food, activities, medical etc) x 3 sessions with usual mod A (AAC if necessary).    Time  4    Period  Weeks    Status  On-going      SLP SHORT TERM GOAL #3   Title  Pt will communicate personal details (medical history, family, name, age, etc) with usual mod A x 3 sessions (AAC if necessary)    Time  4    Period  Weeks    Status  On-going       SLP Long Term Goals - 11/18/18 1550      SLP LONG TERM GOAL #1   Title  Pt will attempt error correction in 3/5 opportunities when cued appropriately by communication partner x 3 sessions.    Time  10    Period  Weeks    Status  On-going      SLP LONG TERM GOAL #2   Title  Family will demo  understanding of at least 4 appropriate ways to enhance pt's comprehension/expression during 4 sessions.     Time  10    Period  Weeks    Status  On-going      SLP LONG TERM GOAL #3   Title  Pt will reduce social isolation risks by communicating social introductions, common social messages, and personal interests using multimodal communication with occasional min A from communication partner over 4 sessions.    Time  10    Period  Weeks    Status  On-going      SLP LONG TERM GOAL #4   Title  Pt/family will report improvement in understanding of pt's needs and requests than prior to ST.    Time  10    Period  Weeks    Status  On-going       Plan - 11/18/18 1548    Clinical Impression Statement  Patient presents with what is most likely severe expressive aphasia, with expressive > receptive deficits. Pt had several fluent spontaneous sentences, common/everyday speech during the session today. Husband continues to learn how to assist pt with SGD at home, and pt continues to work on expressive language ability and use of SGD. I recommend cont'd skilled ST to address maximize communication and for communication partner training/AAC for wants/needs, safety, independence and QOL.    Speech Therapy Frequency  2x / week    Duration  --   12 weeks or 25 total visits   Treatment/Interventions  Cognitive reorganization;Multimodal communcation approach;Compensatory strategies;Language facilitation;Compensatory techniques;Cueing hierarchy;Internal/external aids;Functional tasks;SLP instruction and feedback;Patient/family education    Potential to Achieve Goals  Good    Potential Considerations  Cooperation/participation level;Severity of impairments;Ability to learn/carryover information       Patient will benefit from skilled therapeutic intervention in order to improve the following deficits and impairments:   Aphasia  Cognitive communication deficit    Problem List Patient Active Problem  List   Diagnosis Date Noted  . Expressive aphasia 11/17/2018  . Gait disturbance, post-stroke 11/17/2018  . Hemiparesis affecting dominant side as late effect of cerebrovascular accident ([place]) 11/17/2018  . Aphasia, post-stroke 11/17/2018  . Aphasia as late effect of stroke  11/08/2018  . 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 Left middle cerebral artery stroke ([place]) 10/06/2018  . Dyslipidemia   . Diabetes mellitus type 2 in nonobese (HCC)   . Atrial fibrillation ([place])   . Tachypnea   . Stage 3 chronic kidney disease ([place])   . Stroke [facility]) ischemic embolic L MCA  d/t AF 36/46/8032  . 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 Frequent falls 10/13/2017  . Vitamin D deficiency 04/22/2017  . Slurring of speech 03/31/2017  . Back pain 09/14/2016  . Compression fracture of thoracic vertebra (HCC) 09/14/2016  . S/P vertebroplasty 09/14/2016  . Chronic diastolic heart failure ([place]) 09/14/2016  . HTN (hypertension) 09/14/2016  . History of CVA (cerebrovascular accident) 09/14/2016  . Hypokalemia 09/11/2016  . Paraspinal hematoma 09/11/2016  . Osteoporosis 05/27/2016  . Hypothyroidism 09/11/2014  . Persistent atrial fibrillation ([place]) 12/15/2012  . HLD (hyperlipidemia) 03/27/2011    [facility] ,MS, CCC-SLP  11/18/2018, 3:50 PM  [facility] [address] [place], [place], [address] Phone: [phone]   Fax:  [phone]   Name: [name] MRN: [id number] Date of Birth: [birth year]

## 2018-11-18 NOTE — Therapy (Signed)
[facility] [address] [place], [place], [address] Phone: [phone]   Fax:  [phone]  Occupational Therapy Treatment  Patient Details  Name: [name] MRN: [id number] Date of Birth: [birth year] No data recorded  Encounter Date: 11/18/2018  OT End of Session - 11/18/18 1324    Visit Number  4    Number of Visits  25    Date for OT Re-Evaluation  01/25/19    Authorization Type  Medicare and AARP supplement    Authorization - Visit Number  4    Authorization - Number of Visits  10    OT Start Time  1320    OT Stop Time  1400    OT Time Calculation (min)  40 min    Activity Tolerance  Patient tolerated treatment well    Behavior During Therapy  [facility] for tasks assessed/performed       Past Medical History:  Diagnosis Date  . Atrial fibrillation ([place])   . Chronic anticoagulation   . Chronic anticoagulation   . CVA (cerebrovascular accident) ([place])   . Diabetes mellitus    pt & husband deny any knowledge of this   . History of chicken pox   . Hyperlipidemia   . Hypertension     Past Surgical History:  Procedure Laterality Date  . APPENDECTOMY    . BACK SURGERY    . CARDIAC CATHETERIZATION  11/09/91   EF 70%  . DILATION AND CURETTAGE OF UTERUS    . IR PERCUTANEOUS ART THROMBECTOMY/INFUSION INTRACRANIAL INC DIAG ANGIO  10/03/2018  . RADIOLOGY WITH ANESTHESIA N/A 10/03/2018   Procedure: IR WITH ANESTHESIA;  Surgeon: Radiologist, Medication, MD;  Location: [facility];  Service: Radiology;  Laterality: N/A;    There were no vitals filed for this visit.  Subjective Assessment - 11/18/18 1324    Currently in Pain?  Yes    Pain Score  7     Pain Location  Abdomen    Pain Orientation  Anterior    Pain Descriptors / Indicators  Aching    Pain Type  Chronic pain    Pain Onset  1 to 4 weeks ago    Pain Frequency  Intermittent    Aggravating Factors   bending    Pain Relieving Factors  rest               Pt bumped into 2 items with her walker while and she required v.c to move away from the wall on the right side while entering the gym . Discussion with pt and husband regarding importance of scnning before walking ahead. Scavenger hunt to walk and retrieve items at a walker level, minguard for balance and mod v.c for walker safety and scanning. Pt missed 3/10 items on first pass requiring v.c to locate. copying flower design for cognitive and visual perceptual skills, mod-max v.c to complete design Standing without UE support to perfrom mid range functional reach with RUE, min A for balance                OT Short Term Goals - 11/15/18 1701      OT SHORT TERM GOAL #1   Title  Patient will dress her upper body with modified independence due 11/26/18    Time  4    Period  Weeks    Status  On-going      OT SHORT TERM GOAL #2   Title  Patient will complete toilet hygiene  with min assist    Time  4    Period  Weeks    Status  On-going      OT SHORT TERM GOAL #3   Title  Patient will pull up and down clothing for toileting with modified independence    Time  4    Period  Weeks    Status  Achieved      OT SHORT TERM GOAL #4   Title  Patient will use two hands to cut food on plate as needed    Time  4    Period  Weeks    Status  On-going      OT SHORT TERM GOAL #5   Title  Patient will don / doff LB clothing with no more than min assist    Time  4    Period  Weeks    Status  Achieved        OT Long Term Goals - 11/11/18 1450      OT LONG TERM GOAL #1   Title  Patient will complete a home activities program designed to improve functional use of right UE DUE 01/25/19    Time  12    Period  Weeks    Status  On-going      OT LONG TERM GOAL #2   Title  Patient will dress herself with modified independence    Time  12    Period  Weeks    Status  On-going      OT LONG TERM GOAL #3   Title  Patient will shower with modified independence    Time  12     Period  Weeks    Status  On-going      OT LONG TERM GOAL #4   Title  Patient will toilet with modified independence    Time  12    Period  Weeks    Status  On-going      OT LONG TERM GOAL #5   Title  Patient will reach into chest igh shelf to retrieve a lightweight object (~2lb)  with right hand    Time  12    Period  Weeks    Status  On-going      OT LONG TERM GOAL #6   Title  Patient will demonstrate 5 lb increase in grip strength in right hand    Baseline  10    Time  12    Period  Weeks    Status  On-going            Plan - 11/18/18 1616    Clinical Impression Statement  Pt is progressing towards goals. She demonstrates decreased safety awareness when amb with walker and she bumps into items on R side.    Occupational Profile and client history currently impacting functional performance  wife, retired Acupuncturist deficits (Please refer to evaluation for details):  ADL's;IADL's;Leisure;Social Participation    Rehab Potential  Good    OT Frequency  2x / week    OT Duration  12 weeks    OT Treatment/Interventions  Self-care/ADL training;DME and/or AE instruction;Balance training;Aquatic Therapy;Therapeutic activities;Therapeutic exercise;Cognitive remediation/compensation;Neuromuscular education;Functional Mobility Training;Visual/perceptual remediation/compensation;Patient/family education    Plan  stand tolerance, static to slightly dynamic stand balance, functional use of RUE - mid to high reach, visual perceptual skills    Consulted and Agree with Plan of Care  Patient;Family member/caregiver    Family Member Consulted  [name] - husband  Patient will benefit from skilled therapeutic intervention in order to improve the following deficits and impairments:  Decreased coordination, Improper body mechanics, Decreased endurance, Decreased safety awareness, Decreased activity tolerance, Decreased knowledge of precautions, Decreased balance,  Decreased knowledge of use of DME, Impaired UE functional use, Decreased cognition, Decreased mobility, Impaired vision/preception, Decreased strength, Impaired tone, Impaired sensation  Visit Diagnosis: Muscle weakness (generalized)  Unsteadiness on feet  Cognitive social or emotional deficit following cerebral infarction  Other abnormalities of gait and mobility    Problem List Patient Active Problem List   Diagnosis Date Noted  . Expressive aphasia 11/17/2018  . Gait disturbance, post-stroke 11/17/2018  . Hemiparesis affecting dominant side as late effect of cerebrovascular accident ([place]) 11/17/2018  . Aphasia, post-stroke 11/17/2018  . Aphasia as late effect of stroke 11/08/2018  . 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 Left middle cerebral artery stroke ([place]) 10/06/2018  . Dyslipidemia   . Diabetes mellitus type 2 in nonobese ([facility])   . Atrial fibrillation ([place])   . Tachypnea   . Stage 3 chronic kidney disease ([place])   . Stroke [facility]) ischemic embolic L MCA  d/t AF 32/76/1470  . 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 Frequent falls 10/13/2017  . Vitamin D deficiency 04/22/2017  . Slurring of speech 03/31/2017  . Back pain 09/14/2016  . Compression fracture of thoracic vertebra ([facility]) 09/14/2016  . S/P vertebroplasty 09/14/2016  . Chronic diastolic heart failure ([place]) 09/14/2016  . HTN (hypertension) 09/14/2016  . History of CVA (cerebrovascular accident) 09/14/2016  . Hypokalemia 09/11/2016  . Paraspinal hematoma 09/11/2016  . Osteoporosis 05/27/2016  . Hypothyroidism 09/11/2014  . Persistent atrial fibrillation ([place]) 12/15/2012  . HLD (hyperlipidemia) 03/27/2011    RINE,KATHRYN 11/18/2018, 4:17 PM Theone Murdoch, OTR/L [phone] Phone: [phone] 4:20 PM 11/18/18 [facility] [address] [place], [place], [address] Phone: [phone]   Fax:  [phone]  Name: Damon Baisch MRN: [id number] Date of Birth: 1930/09/17

## 2018-11-22 ENCOUNTER — Ambulatory Visit: Payer: Medicare Other | Attending: Physical Medicine & Rehabilitation

## 2018-11-22 ENCOUNTER — Encounter: Payer: Self-pay | Admitting: Physical Therapy

## 2018-11-22 ENCOUNTER — Ambulatory Visit: Payer: Medicare Other | Admitting: Occupational Therapy

## 2018-11-22 ENCOUNTER — Ambulatory Visit: Payer: Medicare Other | Admitting: Physical Therapy

## 2018-11-22 ENCOUNTER — Encounter: Payer: Self-pay | Admitting: Occupational Therapy

## 2018-11-22 DIAGNOSIS — I69315 Cognitive social or emotional deficit following cerebral infarction: Secondary | ICD-10-CM

## 2018-11-22 DIAGNOSIS — R2681 Unsteadiness on feet: Secondary | ICD-10-CM | POA: Diagnosis not present

## 2018-11-22 DIAGNOSIS — R4701 Aphasia: Secondary | ICD-10-CM | POA: Insufficient documentation

## 2018-11-22 DIAGNOSIS — M6281 Muscle weakness (generalized): Secondary | ICD-10-CM

## 2018-11-22 DIAGNOSIS — I69351 Hemiplegia and hemiparesis following cerebral infarction affecting right dominant side: Secondary | ICD-10-CM

## 2018-11-22 DIAGNOSIS — R2689 Other abnormalities of gait and mobility: Secondary | ICD-10-CM | POA: Insufficient documentation

## 2018-11-22 DIAGNOSIS — R482 Apraxia: Secondary | ICD-10-CM | POA: Insufficient documentation

## 2018-11-22 DIAGNOSIS — Z9181 History of falling: Secondary | ICD-10-CM | POA: Diagnosis not present

## 2018-11-22 DIAGNOSIS — R293 Abnormal posture: Secondary | ICD-10-CM | POA: Insufficient documentation

## 2018-11-22 DIAGNOSIS — R41841 Cognitive communication deficit: Secondary | ICD-10-CM | POA: Diagnosis not present

## 2018-11-22 NOTE — Therapy (Signed)
[facility] [address] [place], [place], [address] Phone: [phone]   Fax:  [phone]  Speech Language Pathology Treatment  Patient Details  Name: [name] MRN: [id number] Date of Birth: April 15, 1930 Referring Provider (SLP): Dr. Wynn Banker   Encounter Date: 11/22/2018  End of Session - 11/22/18 1702    Visit Number  8    Number of Visits  25    Date for SLP Re-Evaluation  01/25/19    Authorization Type  MCR    Authorization Time Period  90 days    SLP Start Time  1319    SLP Stop Time   1400    SLP Time Calculation (min)  41 min    Activity Tolerance  Patient tolerated treatment well       Past Medical History:  Diagnosis Date  . Atrial fibrillation ([facility])   . Chronic anticoagulation   . Chronic anticoagulation   . CVA (cerebrovascular accident) ([facility])   . Diabetes mellitus    pt & husband deny any knowledge of this   . History of chicken pox   . Hyperlipidemia   . Hypertension     Past Surgical History:  Procedure Laterality Date  . APPENDECTOMY    . BACK SURGERY    . CARDIAC CATHETERIZATION  11/09/91   EF 70%  . DILATION AND CURETTAGE OF UTERUS    . IR PERCUTANEOUS ART THROMBECTOMY/INFUSION INTRACRANIAL INC DIAG ANGIO  10/03/2018  . RADIOLOGY WITH ANESTHESIA N/A 10/03/2018   Procedure: IR WITH ANESTHESIA;  Surgeon: Radiologist, Medication, MD;  [facility];  Service: Radiology;  Laterality: N/A;    There were no vitals filed for this visit.  Subjective Assessment - 11/22/18 1320    Subjective  "It was 2023-03-05, it was real pretty (rainy) - I don't know how they gonna feel 'bout me. I just fell dead." (pt fell 2023-03-05)    Patient is accompained by:  Family member   Rocky Link   Currently in Pain?  Yes    Pain Score  7     Pain Location  Knee    Pain Orientation  Left    Pain Descriptors / Indicators  Aching;Sore    Pain Type  Acute pain    Pain Onset  In the past 7 days    Pain  Frequency  Constant    Aggravating Factors   walking    Pain Relieving Factors  resting    Effect of Pain on Daily Activities  more difficult to ambulate            ADULT SLP TREATMENT - 11/22/18 0001      General Information   Behavior/Cognition  Alert;Cooperative;Pleasant mood;Requires cueing      Treatment Provided   Treatment provided  Cognitive-Linquistic      Cognitive-Linquistic Treatment   Treatment focused on  Aphasia;Apraxia    Skilled Treatment  Pt with much jargon speech today with awareness today <10% of the time, demonstrated by pt facial expression and/or attempts to revise spoken message. SLP targeted use of speech generating device (SGD) to encourage pt to use at home, as husband states "She won't use it." Pt req'd hand over hand assist for at least 50% of opportunities to touch SGD to communicate. Husband expresses great frustration about pt yelling jargon at him and getting upset he is not able to fulfill her request or respond to what she has said. "She calls me stupid," pt stated. SLP encouraged pt  that he is doing what he can, suggested asking yes/no ?s in order to narrow down what pt is trying to tell him. SLP told pt husband more effort will be put towards incr'ing pt awareness of her verbal deficit. Husband asked SLP for re-education how to add pictures to an icon. Husband req'd min-mod cues faded to min cues rarely.       Assessment / Recommendations / Plan   Plan  Continue with current plan of care      Progression Toward Goals   Progression toward goals  Not progressing toward goals (comment)   Pt not using/refusing to use SGD at home, decr'd awareness      SLP Education - 11/22/18 1700    Education Details  cues how to narrow pt's verbal message     Person(s) Educated  Spouse    Methods  Explanation    Comprehension  Verbalized understanding;Need further instruction       SLP Short Term Goals - 11/22/18 1712      SLP SHORT TERM GOAL #1   Title   Pt will demo awareness of 50% of errors in a functional task with usual mod A (verbal/written cues).     Baseline  11-18-18    Time  3    Period  Weeks    Status  On-going      SLP SHORT TERM GOAL #2   Title  Pt will communicate preferences or needs (food, activities, medical etc) x 3 sessions with usual mod A (AAC if necessary).    Time  3    Period  Weeks    Status  On-going      SLP SHORT TERM GOAL #3   Title  Pt will communicate personal details (medical history, family, name, age, etc) with usual mod A x 3 sessions (AAC if necessary)    Time  3    Period  Weeks    Status  On-going       SLP Long Term Goals - 11/22/18 1712      SLP LONG TERM GOAL #1   Title  Pt will attempt error correction in 3/5 opportunities when cued appropriately by communication partner x 3 sessions.    Time  9    Period  Weeks    Status  On-going      SLP LONG TERM GOAL #2   Title  Family will demo understanding of at least 4 appropriate ways to enhance pt's comprehension/expression during 4 sessions.     Time  9    Period  Weeks    Status  On-going      SLP LONG TERM GOAL #3   Title  Pt will reduce social isolation risks by communicating social introductions, common social messages, and personal interests using multimodal communication with occasional min A from communication partner over 4 sessions.    Time  9    Period  Weeks    Status  On-going      SLP LONG TERM GOAL #4   Title  Pt/family will report improvement in understanding of pt's needs and requests than prior to ST.    Time  9    Period  Weeks    Status  On-going       Plan - 11/22/18 1703    Clinical Impression Statement  Patient presents with what is most likely severe expressive aphasia, with expressive > receptive deficits. Pt had some fluent spontaneous sentences, and much more word salad than last  week, which led to decr'd awareness of errors due to the amount of speech generated by pt. Husband communicates home is becoming  more challenging as pt expects him to know what pt is saying. SLP told pt more work will be done in ST on incr'ing pt's awareness in order to make home situation better. Husband continues to learn how to assist pt with SGD at home, however states pt is resistant to using [employer] at home. Today pt req'd hand over hand assist for touching appropriate icon. I recommend cont'd skilled ST to address incr'd pt awareness of verbal errors, to maximize communication and to train communication partner for pt use of AAC for wants/needs, safety, independence and QOL.     Speech Therapy Frequency  2x / week    Duration  --   12 weeks or 25 total visits   Treatment/Interventions  Cognitive reorganization;Multimodal communcation approach;Compensatory strategies;Language facilitation;Compensatory techniques;Cueing hierarchy;Internal/external aids;Functional tasks;SLP instruction and feedback;Patient/family education    Potential to Achieve Goals  Good    Potential Considerations  Cooperation/participation level;Severity of impairments;Ability to learn/carryover information       Patient will benefit from skilled therapeutic intervention in order to improve the following deficits and impairments:   Aphasia  Cognitive communication deficit    Problem List Patient Active Problem List   Diagnosis Date Noted  . Expressive aphasia 11/17/2018  . Gait disturbance, post-stroke 11/17/2018  . Hemiparesis affecting dominant side as late effect of cerebrovascular accident ([facility]) 11/17/2018  . Aphasia, post-stroke 11/17/2018  . Aphasia as late effect of stroke 11/08/2018  . 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 Left middle cerebral artery stroke ([facility]) 10/06/2018  . Dyslipidemia   . Diabetes mellitus type 2 in nonobese ([facility])   . Atrial fibrillation ([facility])   . Tachypnea   . Stage 3 chronic kidney disease ([facility])   . Stroke [facility]) ischemic embolic L MCA  d/t AF 10/03/2018  . Middle cerebral artery embolism, left  10/03/2018  . Frequent falls 10/13/2017  . Vitamin D deficiency 04/22/2017  . Slurring of speech 03/31/2017  . Back pain 09/14/2016  . Compression fracture of thoracic vertebra ([facility]) 09/14/2016  . S/P vertebroplasty 09/14/2016  . Chronic diastolic heart failure ([facility]) 09/14/2016  . HTN (hypertension) 09/14/2016  . History of CVA (cerebrovascular accident) 09/14/2016  . Hypokalemia 09/11/2016  . Paraspinal hematoma 09/11/2016  . Osteoporosis 05/27/2016  . Hypothyroidism 09/11/2014  . Persistent atrial fibrillation ([facility]) 12/15/2012  . HLD (hyperlipidemia) 03/27/2011    [facility] HospitalCHINKE, ,MS, CCC-SLP  11/22/2018, 5:13 PM  [facility] [address] [place], [place], [address] Phone: [phone]   Fax:  [phone]   Name: Pincus LargeCarolyn W White Reichard MRN: [id number] Date of Birth: 1930/05/23

## 2018-11-22 NOTE — Therapy (Signed)
[facility] [address] [place], [place], [address] Phone: [phone]   Fax:  [phone]  Occupational Therapy Treatment  Patient Details  Name: [name] MRN: [id number] Date of Birth: Feb 29, 1930 No data recorded  Encounter Date: 11/22/2018  OT End of Session - 11/22/18 1616    Visit Number  5    Number of Visits  25    Date for OT Re-Evaluation  01/25/19    Authorization Type  Medicare and AARP supplement    Authorization - Visit Number  5    Authorization - Number of Visits  10    OT Start Time  1405    OT Stop Time  1445    OT Time Calculation (min)  40 min    Activity Tolerance  Patient tolerated treatment well    Behavior During Therapy  [facility] for tasks assessed/performed       Past Medical History:  Diagnosis Date  . Atrial fibrillation (HCC)   . Chronic anticoagulation   . Chronic anticoagulation   . CVA (cerebrovascular accident) (HCC)   . Diabetes mellitus    pt & husband deny any knowledge of this   . History of chicken pox   . Hyperlipidemia   . Hypertension     Past Surgical History:  Procedure Laterality Date  . APPENDECTOMY    . BACK SURGERY    . CARDIAC CATHETERIZATION  11/09/91   EF 70%  . DILATION AND CURETTAGE OF UTERUS    . IR PERCUTANEOUS ART THROMBECTOMY/INFUSION INTRACRANIAL INC DIAG ANGIO  10/03/2018  . RADIOLOGY WITH ANESTHESIA N/A 10/03/2018   Procedure: IR WITH ANESTHESIA;  Surgeon: Radiologist, Medication, MD;  Location: [facility] OR;  Service: Radiology;  Laterality: N/A;    There were no vitals filed for this visit.  Subjective Assessment - 11/22/18 1411    Subjective   Patient and husband indicate that patient fell on Friday     Patient is accompained by:  Family member    Pain Score  7     Pain Location  Knee    Pain Orientation  Left    Pain Descriptors / Indicators  Aching;Sore    Pain Type  Acute pain    Pain Onset  In the past 7 days    Aggravating  Factors   standing    Pain Relieving Factors  resting                   OT Treatments/Exercises (OP) - 11/22/18 0001      ADLs   Grooming  Patient uses two hands to put make up on without difficulty.      UB Dressing  Patient dons pull over shirt without help, but husband has needed to fasten bra.  Patient with improved coordination and strength in right hand and she is spontaneously using it much more frequently.  Encourgaed patient and husband to attempt to fasten bra with less assistance.      LB Dressing  Patient putting on and taking off LB clothing more consistently at home per husband and patient's report.  Encouraging husband to step back whenever possible to allow patient to safely complete familiar tasks    Home Maintenance  Patient able to stand 5 min at a time to fold laundry.  Initially patient braced hips against counter, but with cueing able to stand without support and utilize both hands simultaneously.  Patient able to lift lightweight object into overhead cabinet with  right hand, and with two hands.  Patient very fearful to take left hand from support - but does well with encouragemnt and facilitation.      ADL Comments  Encourgaed patient and husband to attempt standing and washing dishes, and standing to fold clothing at home as part of home activity program.               OT Education - 11/22/18 1616    Education Details  home activities program to include laundry and washing dishes    Person(s) Educated  Patient;Spouse    Methods  Explanation;Demonstration    Comprehension  Verbalized understanding;Need further instruction       OT Short Term Goals - 11/22/18 1427      OT SHORT TERM GOAL #1   Title  Patient will dress her upper body with modified independence due 11/26/18    Time  4    Period  Weeks    Status  On-going      OT SHORT TERM GOAL #2   Title  Patient will complete toilet hygiene with min assist    Status  Achieved      OT SHORT  TERM GOAL #3   Title  Patient will pull up and down clothing for toileting with modified independence    Status  Achieved      OT SHORT TERM GOAL #4   Title  Patient will use two hands to cut food on plate as needed    Status  Achieved      OT SHORT TERM GOAL #5   Title  Patient will don / doff LB clothing with no more than min assist    Status  Achieved        OT Long Term Goals - 11/22/18 1432      OT LONG TERM GOAL #6   Title  Patient will demonstrate 5 lb increase in grip strength in right hand    Baseline  10    Time  12    Period  Weeks    Status  Achieved            Plan - 11/22/18 1617    Clinical Impression Statement  Patient less troubled by abdominal pain today.  Patient progressing with stand tolerance and static stand balance.  Patient more animated in therapy sessions, and showing improved functional use of RUE.      Occupational Profile and client history currently impacting functional performance  wife, retired Medical laboratory scientific officerbanker    Occupational performance deficits (Please refer to evaluation for details):  ADL's;IADL's;Leisure;Social Participation    Rehab Potential  Good    OT Frequency  2x / week    OT Duration  12 weeks    OT Treatment/Interventions  Self-care/ADL training;DME and/or AE instruction;Balance training;Aquatic Therapy;Therapeutic activities;Therapeutic exercise;Cognitive remediation/compensation;Neuromuscular education;Functional Mobility Training;Visual/perceptual remediation/compensation;Patient/family education    Plan  stand tolerance, static to slightly dynamic stand balance, functional use of RUE - mid to high reach, visual perceptual skills- Check if patient folding laundry or washing dishes    Clinical Decision Making  Multiple treatment options, significant modification of task necessary    Consulted and Agree with Plan of Care  Patient;Family member/caregiver    Family Member Consulted  Rocky LinkKen - husband       Patient will benefit from skilled  therapeutic intervention in order to improve the following deficits and impairments:  Decreased coordination, Improper body mechanics, Decreased endurance, Decreased safety awareness, Decreased activity tolerance, Decreased knowledge of precautions,  Decreased balance, Decreased knowledge of use of DME, Impaired UE functional use, Decreased cognition, Decreased mobility, Impaired vision/preception, Decreased strength, Impaired tone, Impaired sensation  Visit Diagnosis: Muscle weakness (generalized)  Unsteadiness on feet  Cognitive social or emotional deficit following cerebral infarction  Hemiplegia and hemiparesis following cerebral infarction affecting right dominant side (HCC)  Apraxia  Abnormal posture    Problem List Patient Active Problem List   Diagnosis Date Noted  . Expressive aphasia 11/17/2018  . Gait disturbance, post-stroke 11/17/2018  . Hemiparesis affecting dominant side as late effect of cerebrovascular accident (HCC) 11/17/2018  . Aphasia, post-stroke 11/17/2018  . Aphasia as late effect of stroke 11/08/2018  . 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 Left middle cerebral artery stroke (HCC) 10/06/2018  . Dyslipidemia   . Diabetes mellitus type 2 in nonobese (HCC)   . Atrial fibrillation (HCC)   . Tachypnea   . Stage 3 chronic kidney disease (HCC)   . Stroke [facility]) ischemic embolic L MCA  d/t AF 10/03/2018  . 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 Frequent falls 10/13/2017  . Vitamin D deficiency 04/22/2017  . Slurring of speech 03/31/2017  . Back pain 09/14/2016  . Compression fracture of thoracic vertebra (HCC) 09/14/2016  . S/P vertebroplasty 09/14/2016  . Chronic diastolic heart failure (HCC) 09/14/2016  . HTN (hypertension) 09/14/2016  . History of CVA (cerebrovascular accident) 09/14/2016  . Hypokalemia 09/11/2016  . Paraspinal hematoma 09/11/2016  . Osteoporosis 05/27/2016  . Hypothyroidism 09/11/2014  . Persistent atrial fibrillation (HCC)  12/15/2012  . HLD (hyperlipidemia) 03/27/2011    Collier Salina, OTR/L 11/22/2018, 4:20 PM  [facility] [address] [place], [place], [address] Phone: [phone]   Fax:  [phone]  Name: [name] MRN: [id number] Date of Birth: 06/09/1930

## 2018-11-23 NOTE — Therapy (Signed)
[facility] [address] [place], [place], [address] Phone: [phone]   Fax:  [phone]  Physical Therapy Treatment  Patient Details  Name: [name] MRN: [id number] Date of Birth: 1930-10-08 Referring Provider (PT): Alysia Penna, MD   Encounter Date: 11/22/2018  PT End of Session - 11/22/18 1451    Visit Number  5    Number of Visits  25    Date for PT Re-Evaluation  01/24/19    Authorization Type  Medicare & AARP  100% covered;  follow Medicare guidelines    PT Start Time  1448    PT Stop Time  1530    PT Time Calculation (min)  42 min    Equipment Utilized During Treatment  Gait belt    Activity Tolerance  Patient tolerated treatment well;Patient limited by fatigue;Patient limited by pain   limited by knee pain today   Behavior During Therapy  Flat affect;WFL for tasks assessed/performed       Past Medical History:  Diagnosis Date  . Atrial fibrillation ([place])   . Chronic anticoagulation   . Chronic anticoagulation   . CVA (cerebrovascular accident) ([place])   . Diabetes mellitus    pt & husband deny any knowledge of this   . History of chicken pox   . Hyperlipidemia   . Hypertension     Past Surgical History:  Procedure Laterality Date  . APPENDECTOMY    . BACK SURGERY    . CARDIAC CATHETERIZATION  11/09/91   EF 70%  . DILATION AND CURETTAGE OF UTERUS    . IR PERCUTANEOUS ART THROMBECTOMY/INFUSION INTRACRANIAL INC DIAG ANGIO  10/03/2018  . RADIOLOGY WITH ANESTHESIA N/A 10/03/2018   Procedure: IR WITH ANESTHESIA;  Surgeon: Radiologist, Medication, MD;  Location: [facility];  Service: Radiology;  Laterality: N/A;    There were no vitals filed for this visit.  Subjective Assessment - 11/22/18 1447    Subjective  Had a fall on Friday going into the front door with the rain. Lost balance and fell to the left. Fell fwd onto left knee. Spouse reports it's bruised. Pt reports it hurts. Has  not seen the MD for it.     Patient is accompained by:  Family member   spouse   Limitations  Lifting;Standing;Walking;House hold activities    Patient Stated Goals  To talk better, to walk & balance better.    Currently in Pain?  Yes    Pain Score  6     Pain Location  Knee    Pain Orientation  Left    Pain Descriptors / Indicators  Aching;Sore;Tender    Pain Type  Acute pain    Pain Onset  In the past 7 days    Pain Frequency  Constant    Aggravating Factors   standing on it, moving it    Pain Relieving Factors  rest            [facility] Adult PT Treatment/Exercise - 11/22/18 1457      Transfers   Transfers  Sit to Stand;Stand to Sit    Sit to Stand  5: Supervision;From bed;From chair/3-in-1    Stand to Sit  5: Supervision;To bed;To chair/3-in-1    Number of Reps  1 set;Other reps (comment)   5 reps   Comments  use of hands to stand, did not need external support with staiding. cues and encouragement needed to try on 1st rep, none with remaining reps  Ambulation/Gait   Ambulation/Gait  Yes    Ambulation/Gait Assistance  5: Supervision;4: Min guard    Ambulation/Gait Assistance Details  weaving around furniture/obstacles with cues only needed.     Ambulation Distance (Feet)  115 Feet   x1   Assistive device  Rolling walker    Gait Pattern  Step-to pattern;Decreased arm swing - right;Decreased step length - left;Decreased stance time - right;Decreased weight shift to right;Right flexed knee in stance;Decreased hip/knee flexion - right;Antalgic;Lateral hip instability;Abducted- right    Ambulation Surface  Level;Indoor      High Level Balance   High Level Balance Activities  Side stepping;Backward walking;Marching forwards    High Level Balance Comments  at counter top with UE support/HHA: 3 laps each with cues on form and technique.           Balance Exercises - 11/22/18 1506      Balance Exercises: Standing   SLS with Vectors  Solid surface;Other reps  (comment);Limitations      Balance Exercises: Standing   SLS with Vectors Limitations  2 foam bubbles on floor: with right HHA only- alternating fwd toe taps with cues on weight shifting and stance control. attempted alternating cross toe taps with pt unable to coordinate/follow cues                                   PT Short Term Goals - 11/22/18 1452      PT SHORT TERM GOAL #1   Title  Patient's husband verbalizes fall risk prevention strategies. (All STGs Target Date: 11/25/2018)    Baseline  11/22/18: met per spouse report.     Status  Achieved      PT SHORT TERM GOAL #2   Title  patient ambulates 100' around furniture with RW with supervision.     Baseline  11/22/18: met today    Time  --    Period  --    Status  Achieved      PT SHORT TERM GOAL #3   Title  Patient sit to/from stand from chairs with armrests without touching external support to stabilize with supervision.     Baseline  11/22/18: met today    Time  --    Period  --    Status  Achieved      PT SHORT TERM GOAL #4   Title  Patient & husband demonstrate understanding of initial HEP.     Baseline  11/22/18: met with current HEP    Status  Achieved        PT Long Term Goals - 10/27/18 1008      PT LONG TERM GOAL #1   Title  Patient & husband verbalize understanding of fall prevention strategies including appropriate assistive device. (All LTGs Target Date: 01/20/2019)    Time  12    Period  Weeks    Status  New    Target Date  01/20/19      PT LONG TERM GOAL #2   Title  Berg Balance >36/56 to indicate lower fall risk.     Time  12    Period  Weeks    Status  New    Target Date  01/20/19      PT LONG TERM GOAL #3   Title  Timed Up & Go with LRAD modified independent <13.5 seconds    Time  12    Period  Weeks  Status  New    Target Date  01/20/19      PT LONG TERM GOAL #4   Title  Patient ambulates 100' around furniture with LRAD with no balance losses with supervision for cognitive deficits only to  enable safe mobility within her home without assistance.     Time  12    Period  Weeks    Status  New    Target Date  01/20/19      PT LONG TERM GOAL #5   Title  Patient ambulates 400' outdoors on paved surfaces with LRAD with supervision for community mobility.     Time  12    Period  Weeks    Status  New    Target Date  01/20/19      Additional Long Term Goals   Additional Long Term Goals  Yes      PT LONG TERM GOAL #6   Title  Patient negotiates ramps, curbs & 2 steps similar to home entrance with LRAD with supervision for safe community access.    Time  12    Period  Weeks    Status  New    Target Date  01/20/19      PT LONG TERM GOAL #7   Title  Patient and husband demonstrate & verbalize understanding of ongoing HEP & fitness plan.     Time  12    Period  Weeks    Status  New    Target Date  01/20/19            Plan - 11/22/18 1452    Clinical Impression Statement  Today's skilled session initially focused on checking progress toward STGs with all goals met. Primary PT to set updated STGs. Remainder of session focused on balance reactions with rest breaks needed due to fatigue. The pt is progressing toward goals and should benefit from continued PT to progress toward unmet goals.     Rehab Potential  Good    PT Frequency  2x / week    PT Duration  12 weeks    PT Treatment/Interventions  ADLs/Self Care Home Management;DME Instruction;Gait training;Stair training;Functional mobility training;Therapeutic activities;Therapeutic exercise;Balance training;Neuromuscular re-education;Patient/family education;Vestibular    PT Next Visit Plan   balance activities & gait with RW scanning environment & conversing    PT [place] and Agree with Plan of Care  Patient;Family member/caregiver    Family Member Consulted  husband, Nelson Chimes       Patient will benefit from skilled therapeutic intervention in order to improve the following  deficits and impairments:  Abnormal gait, Decreased activity tolerance, Decreased balance, Decreased cognition, Decreased coordination, Decreased endurance, Decreased knowledge of use of DME, Decreased mobility, Decreased strength, Dizziness, Postural dysfunction, Pain  Visit Diagnosis: Muscle weakness (generalized)  Unsteadiness on feet  Other abnormalities of gait and mobility  Abnormal posture  History of falling     Problem List Patient Active Problem List   Diagnosis Date Noted  . Expressive aphasia 11/17/2018  . Gait disturbance, post-stroke 11/17/2018  . Hemiparesis affecting dominant side as late effect of cerebrovascular accident ([place]) 11/17/2018  . Aphasia, post-stroke 11/17/2018  . Aphasia as late effect of stroke 11/08/2018  . 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 Left middle cerebral artery stroke ([place]) 10/06/2018  . Dyslipidemia   . Diabetes mellitus type 2 in nonobese (HCC)   . Atrial fibrillation ([place])   . Tachypnea   . Stage 3  chronic kidney disease ([place])   . Stroke [facility]) ischemic embolic L MCA  d/t AF 07/37/1062  . 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 Frequent falls 10/13/2017  . Vitamin D deficiency 04/22/2017  . Slurring of speech 03/31/2017  . Back pain 09/14/2016  . Compression fracture of thoracic vertebra (HCC) 09/14/2016  . S/P vertebroplasty 09/14/2016  . Chronic diastolic heart failure ([place]) 09/14/2016  . HTN (hypertension) 09/14/2016  . History of CVA (cerebrovascular accident) 09/14/2016  . Hypokalemia 09/11/2016  . Paraspinal hematoma 09/11/2016  . Osteoporosis 05/27/2016  . Hypothyroidism 09/11/2014  . Persistent atrial fibrillation ([place]) 12/15/2012  . HLD (hyperlipidemia) 03/27/2011    Willow Ora, PTA, [facility] [address], [place], [place] [address] [phone] 11/23/18, 1:54 PM   Name: Solaris Kram MRN: [id number] Date of Birth: [birth year]

## 2018-11-25 ENCOUNTER — Ambulatory Visit: Payer: Medicare Other

## 2018-11-25 DIAGNOSIS — M6281 Muscle weakness (generalized): Secondary | ICD-10-CM | POA: Diagnosis not present

## 2018-11-25 DIAGNOSIS — R482 Apraxia: Secondary | ICD-10-CM | POA: Diagnosis not present

## 2018-11-25 DIAGNOSIS — R4701 Aphasia: Secondary | ICD-10-CM

## 2018-11-25 DIAGNOSIS — R41841 Cognitive communication deficit: Secondary | ICD-10-CM

## 2018-11-25 DIAGNOSIS — I69351 Hemiplegia and hemiparesis following cerebral infarction affecting right dominant side: Secondary | ICD-10-CM | POA: Diagnosis not present

## 2018-11-25 DIAGNOSIS — R2681 Unsteadiness on feet: Secondary | ICD-10-CM | POA: Diagnosis not present

## 2018-11-25 NOTE — Patient Instructions (Signed)
Encourage [name] to USE the [facility]- it assists her when she talks and helps you communicate!

## 2018-11-25 NOTE — Therapy (Signed)
[facility] [address] [place], [place], [address] Phone: [phone]   Fax:  [phone]  Speech Language Pathology Treatment  Patient Details  Name: Janice LargeCarolyn W White Reichard MRN: [id number] Date of Birth: 31-Dec-1929 Referring Provider (SLP): Dr. Wynn BankerKirsteins   Encounter Date: 11/25/2018  End of Session - 11/25/18 1439    Visit Number  9    Number of Visits  25    Date for SLP Re-Evaluation  01/25/19    SLP Start Time  1319    SLP Stop Time   1402    SLP Time Calculation (min)  43 min    Activity Tolerance  Patient tolerated treatment well       Past Medical History:  Diagnosis Date  . Atrial fibrillation (HCC)   . Chronic anticoagulation   . Chronic anticoagulation   . CVA (cerebrovascular accident) (HCC)   . Diabetes mellitus    pt & husband deny any knowledge of this   . History of chicken pox   . Hyperlipidemia   . Hypertension     Past Surgical History:  Procedure Laterality Date  . APPENDECTOMY    . BACK SURGERY    . CARDIAC CATHETERIZATION  11/09/91   EF 70%  . DILATION AND CURETTAGE OF UTERUS    . IR PERCUTANEOUS ART THROMBECTOMY/INFUSION INTRACRANIAL INC DIAG ANGIO  10/03/2018  . RADIOLOGY WITH ANESTHESIA N/A 10/03/2018   Procedure: IR WITH ANESTHESIA;  Surgeon: Radiologist, Medication, MD;  Location: [facility] OR;  Service: Radiology;  Laterality: N/A;    There were no vitals filed for this visit.         ADULT SLP TREATMENT - 11/25/18 1427      General Information   Behavior/Cognition  Alert;Cooperative;Pleasant mood;Requires cueing      Treatment Provided   Treatment provided  Cognitive-Linquistic      Cognitive-Linquistic Treatment   Treatment focused on  Aphasia;Apraxia    Skilled Treatment  Pt demonstrated awareness of her jargon speech with approx 10-15% of the time. (pt stated "I'm sorry.")  Husband reports pt getting upset with him if he does not understand - wife today wanted pt  to put the blinds down and shouted at him - reports pt said "stupid". Pt denies this ("Your words!"). SLP used digital recorder today after asking pt question that made her name vegetables. SLP played back to pt, and she raised her eyebrows and shook her head. Pt agreed she did not understand what she said. SLP likened that to husband attempting to understand pt at home. Pt demo'd understanding. Then SLP got Lingraphica and asked pt same question - she pointed out 8-9 veggies right away and SLP made the point that NOW SLP understands what vegies pt was trying to communicate because she used the Lingraphica to do so. SLP learned pt's clothes (Dressing) can be a situation that causes stress so SLP worked with pt/husband to generate icons for some clothing items.       Assessment / Recommendations / Plan   Plan  Continue with current plan of care      Progression Toward Goals   Progression toward goals  Not progressing toward goals (comment)   pt not using SGD at home-SLP showed pt it incr comprehension      SLP Education - 11/25/18 1438    Education Details  SGC assists pt own verbal output as well as improves listener comprehension    Person(s) Educated  Patient;Spouse    Methods  Explanation;Demonstration    Comprehension  Verbalized understanding       SLP Short Term Goals - 11/25/18 1444      SLP SHORT TERM GOAL #1   Title  Pt will demo awareness of 50% of errors in a functional task with usual mod A (verbal/written cues).     Baseline  11-18-18    Time  3    Period  Weeks    Status  On-going      SLP SHORT TERM GOAL #2   Title  Pt will communicate preferences or needs (food, activities, medical etc) x 3 sessions with usual mod A (AAC if necessary).    Baseline  11-25-18 (vegetables)    Time  3    Period  Weeks    Status  On-going      SLP SHORT TERM GOAL #3   Title  Pt will communicate personal details (medical history, family, name, age, etc) with usual mod A x 3 sessions (AAC if  necessary)    Time  3    Period  Weeks    Status  On-going       SLP Long Term Goals - 11/25/18 1444      SLP LONG TERM GOAL #1   Title  Pt will attempt error correction in 3/5 opportunities when cued appropriately by communication partner x 3 sessions.    Time  9    Period  Weeks    Status  On-going      SLP LONG TERM GOAL #2   Title  Family will demo understanding of at least 4 appropriate ways to enhance pt's comprehension/expression during 4 sessions.     Time  9    Period  Weeks    Status  On-going      SLP LONG TERM GOAL #3   Title  Pt will reduce social isolation risks by communicating social introductions, common social messages, and personal interests using multimodal communication with occasional min A from communication partner over 4 sessions.    Time  9    Period  Weeks    Status  On-going      SLP LONG TERM GOAL #4   Title  Pt/family will report improvement in understanding of pt's needs and requests than prior to ST.    Time  9    Period  Weeks    Status  On-going       Plan - 11/25/18 1439    Clinical Impression Statement  Patient presents with likely severe expressive aphasia, with expressive > receptive deficits. Pt had some rare functional spontaneous 1-2 word utterances (less than last session). Her frequency of word salad was equal to last session - SLP attempted to raise pt's awareness of jargon speech today by using digital recorder. Pt appeared to understand she was not talking in a manner SLP/husband could understand. Husband continues to learn how to assist pt with SGD at home, however states pt's resistance is at the same level. Hopefully with more work on raising pt's awareness of errors she will be more apt to go to [place] at home, to communicate. Today pt req'd max cues/hand over hand assist for touching appropriate icon when husband/SLP were programming SGD for clothing items. I recommend cont'd skilled ST to address incr'd pt awareness of  verbal errors, to maximize communication and to train communication partner for pt use of AAC for wants/needs, safety, independence and QOL.     Speech Therapy Frequency  2x / week  Duration  --   12 weeks or 25 total visits   Treatment/Interventions  Cognitive reorganization;Multimodal communcation approach;Compensatory strategies;Language facilitation;Compensatory techniques;Cueing hierarchy;Internal/external aids;Functional tasks;SLP instruction and feedback;Patient/family education    Potential to Achieve Goals  Good    Potential Considerations  Cooperation/participation level;Severity of impairments;Ability to learn/carryover information       Patient will benefit from skilled therapeutic intervention in order to improve the following deficits and impairments:   Aphasia  Cognitive communication deficit    Problem List Patient Active Problem List   Diagnosis Date Noted  . Expressive aphasia 11/17/2018  . Gait disturbance, post-stroke 11/17/2018  . Hemiparesis affecting dominant side as late effect of cerebrovascular accident (HCC) 11/17/2018  . Aphasia, post-stroke 11/17/2018  . Aphasia as late effect of stroke 11/08/2018  . 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 Left middle cerebral artery stroke (HCC) 10/06/2018  . Dyslipidemia   . Diabetes mellitus type 2 in nonobese (HCC)   . Atrial fibrillation (HCC)   . Tachypnea   . Stage 3 chronic kidney disease (HCC)   . Stroke [facility]) ischemic embolic L MCA  d/t AF 10/03/2018  . 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 Frequent falls 10/13/2017  . Vitamin D deficiency 04/22/2017  . Slurring of speech 03/31/2017  . Back pain 09/14/2016  . Compression fracture of thoracic vertebra (HCC) 09/14/2016  . S/P vertebroplasty 09/14/2016  . Chronic diastolic heart failure (HCC) 09/14/2016  . HTN (hypertension) 09/14/2016  . History of CVA (cerebrovascular accident) 09/14/2016  . Hypokalemia 09/11/2016  . Paraspinal hematoma  09/11/2016  . Osteoporosis 05/27/2016  . Hypothyroidism 09/11/2014  . Persistent atrial fibrillation (HCC) 12/15/2012  . HLD (hyperlipidemia) 03/27/2011    [facility] ,MS, CCC-SLP  11/25/2018, 2:45 PM  [facility] [address] [place], [place], [address] Phone: [phone]   Fax:  [phone]   Name: Zelia Tannahill MRN: [id number] Date of Birth: 27-Oct-1929

## 2018-11-28 ENCOUNTER — Ambulatory Visit (HOSPITAL_COMMUNITY)
Admission: RE | Admit: 2018-11-28 | Discharge: 2018-11-28 | Disposition: A | Discharge status: Home / Self Care | Payer: Medicare Other | Source: Ambulatory Visit | Attending: Interventional Radiology | Admitting: Interventional Radiology

## 2018-11-28 DIAGNOSIS — I639 Cerebral infarction, unspecified: Secondary | ICD-10-CM

## 2018-11-28 DIAGNOSIS — I6602 Occlusion and stenosis of left middle cerebral artery: Secondary | ICD-10-CM | POA: Diagnosis not present

## 2018-11-28 NOTE — Progress Notes (Signed)
Chief Complaint: Patient was seen in consultation today for left MCA M2 segment occlusion s/p attempted revascularization.  Referring Physician(s): CODE STROKE- Aroor, Dara LordsSushanth R  Supervising Physician: Julieanne Cottoneveshwar, Sanjeev  [facility]  History of Present Illness: [name] is a 85 y.o. female with a past medical history as below, with pertinent past medical history including hypertension, hyperlipidemia, atrial fibrillation on chronic anticoagulation with Eliquis, CVA 09/2018, and diabetes mellitus. [facility] and has been followed by Dr. Corliss Skainseveshwar since 09/2018. She first presented to our department as an active code stroke with symptoms of right-sided weakness and confusion. She underwent an image-guided cerebral arteriogram with attempted revascularization of left MCA M2 segment occlusion 10/03/2018 by Dr. Corliss Skainseveshwar. Unfortunately, due to severe tortuosity of patient's aortic arch, endovascular treatment could not occur. Patient was discharged to inpatient rehab 10/06/2018 and discharged home 10/12/2018.  Patient presents today for follow-up regarding her recent stroke and attempted procedure 10/03/2018. Patient awake and alert sitting in wheelchair. Accompanied by husband. Complains of speech difficulty, improved since discharge. Husband states that she demonstrates difficulty with word finding, however sometimes is able to say full sentences. States she sees speech therapy for this. Complains of "balance issues". Husband states that he has to hold his wife when she walks, as she easily becomes "off balance". States she uses a walker when ambulating. Denies headache, weakness, numbness/tingling, dizziness, vision changes, hearing changes, or tinnitus.  Patient is currently taking Eliquis 2.5 mg twice daily.   Past Medical History:  Diagnosis Date  . Atrial fibrillation (HCC)   . Chronic anticoagulation   . Chronic anticoagulation   . CVA  (cerebrovascular accident) (HCC)   . Diabetes mellitus    pt & husband deny any knowledge of this   . History of chicken pox   . Hyperlipidemia   . Hypertension     Past Surgical History:  Procedure Laterality Date  . APPENDECTOMY    . BACK SURGERY    . CARDIAC CATHETERIZATION  11/09/91   EF 70%  . DILATION AND CURETTAGE OF UTERUS    . IR PERCUTANEOUS ART THROMBECTOMY/INFUSION INTRACRANIAL INC DIAG ANGIO  10/03/2018  . RADIOLOGY WITH ANESTHESIA N/A 10/03/2018   Procedure: IR WITH ANESTHESIA;  Surgeon: Radiologist, Medication, MD;  Location: [facility] OR;  Service: Radiology;  Laterality: N/A;    Allergies: Cozaar; Hydralazine; Hyzaar [losartan potassium-hctz]; Sulfa drugs cross reactors; and Lisinopril  Medications: Prior to Admission medications   Medication Sig Start Date End Date Taking? Authorizing Provider  acetaminophen (TYLENOL) 325 MG tablet Take 2 tablets (650 mg total) by mouth every 4 (four) hours as needed for mild pain (or temp > 37.5 C (99.5 F)). 10/06/18   Layne BentonBiby, [name] L, NP  amLODipine (NORVASC) 5 MG tablet Take 1 tablet (5 mg total) by mouth daily. 11/10/18   Chilton Siandolph, Tiffany, MD  apixaban (ELIQUIS) 2.5 MG TABS tablet Take 1 tablet (2.5 mg total) by mouth 2 (two) times daily. Patient taking differently: Take 5 mg by mouth 2 (two) times daily.  10/11/18   Angiulli, Mcarthur Rossettianiel J, PA-C  atorvastatin (LIPITOR) 20 MG tablet Take 0.5 tablets (10 mg total) by mouth daily. 10/11/18   Angiulli, Mcarthur Rossettianiel J, PA-C  levothyroxine (SYNTHROID, LEVOTHROID) 25 MCG tablet Take 1 tablet (25 mcg total) by mouth daily before breakfast. 10/11/18   Angiulli, Mcarthur Rossettianiel J, PA-C  nadolol (CORGARD) 20 MG tablet Take 0.5 tablets (10 mg total) by mouth daily. 10/11/18   Charlton AmorAngiulli, Daniel J, PA-C  nitroGLYCERIN (NITROSTAT) 0.4 MG SL tablet Place 0.4 mg under the tongue every 5 (five) minutes as needed (chest pain).     [provider]     Family History  Problem Relation Age of Onset  . Heart disease  Mother   . Hypertension Mother   . Stroke Mother   . Heart disease Father   . Heart failure Brother   . Osteoporosis Neg Hx     Social History   Socioeconomic History  . Marital status: Married    Spouse name: Not on file  . Number of children: 2  . Years of education: Not on file  . Highest education level: High school graduate  Occupational History  . Not on file  Social Needs  . Financial resource strain: Not on file  . Food insecurity:    Worry: Not on file    Inability: Not on file  . Transportation needs:    Medical: Not on file    Non-medical: Not on file  Tobacco Use  . Smoking status: Former Smoker    Last attempt to quit: 10/19/1998    Years since quitting: 20.1  . Smokeless tobacco: Never Used  Substance and Sexual Activity  . Alcohol use: No  . Drug use: No  . Sexual activity: Not on file  Lifestyle  . Physical activity:    Days per week: Not on file    Minutes per session: Not on file  . Stress: Not on file  Relationships  . Social connections:    Talks on phone: Not on file    Gets together: Not on file    Attends religious service: Not on file    Active member of club or organization: Not on file    Attends meetings of clubs or organizations: Not on file    Relationship status: Not on file  Other Topics Concern  . Not on file  Social History Narrative   Lives at home with her husband    Right handed   Caffeine: sometimes coffee     Review of Systems: A 12 point ROS discussed and pertinent positives are indicated in the HPI above.  All other systems are negative.  Review of Systems  Constitutional: Negative for chills and fever.  HENT: Negative for hearing loss and tinnitus.   Eyes: Negative for visual disturbance.  Respiratory: Negative for shortness of breath and wheezing.   Cardiovascular: Negative for chest pain and palpitations.  Musculoskeletal: Positive for gait problem.  Neurological: Positive for speech difficulty. Negative for  dizziness, weakness, numbness and headaches.  Psychiatric/Behavioral: Negative for behavioral problems and confusion.    Physical Exam Constitutional:      General: She is not in acute distress.    Appearance: Normal appearance.  Pulmonary:     Effort: Pulmonary effort is normal. No respiratory distress.  Skin:    General: Skin is warm and dry.  Neurological:     Mental Status: She is alert and oriented to person, place, and time.  Psychiatric:        Mood and Affect: Mood normal.        Behavior: Behavior normal.        Thought Content: Thought content normal.        Judgment: Judgment normal.     Labs:  CBC: Recent Labs    10/04/18 0856 10/05/18 0410 10/06/18 0508 10/07/18 0522  WBC 14.2* 10.1 8.3 7.9  HGB 11.5* 9.7* 9.5* 10.0*  HCT 37.7 29.8* 28.1* 29.7*  PLT 285 263 266 273    COAGS: Recent Labs    10/03/18 1555  10/06/18 0508 10/07/18 0522 10/08/18 0613 10/09/18 0537  INR 2.52   < > 2.17 1.53 1.72 1.67  APTT 35  --   --   --   --   --    < > = values in this interval not displayed.    BMP: Recent Labs    10/04/18 0856 10/05/18 0410 10/06/18 0508 10/07/18 0522  NA 139 139 138 137  K 4.4 3.8 3.3* 3.5  CL 108 106 105 106  CO2 20* 21* 23 20*  GLUCOSE 135* 114* 111* 112*  BUN 21 16 12 11   CALCIUM 7.7* 7.3* 7.3* 7.5*  CREATININE 1.51* 1.44* 1.36* 1.42*  GFRNONAA 31* 33* 35* 33*  GFRAA 36* 38* 41* 39*    LIVER FUNCTION TESTS: Recent Labs    10/03/18 1555 10/07/18 0522  BILITOT 0.6 1.0  AST 22 28  ALT 16 16  ALKPHOS 52 42  PROT 6.4* 5.4*  ALBUMIN 3.2* 3.0*    Assessment and Plan:  Left MCA M2 segment occlusion s/p attempted mechanical thrombectomy 10/03/2018 by Dr. Corliss Skains. Dr. Corliss Skains was present for consultation. Discussed patient's symptoms. Husband states that her speech has improved, as sometimes she can speak in full sentences, but sometimes all of her words are "jumbled". Recommended patient continue speech  therapy.  Discussed patient's appetite. Patient's husband states that she has lost weight and does not eat enough. Recommended patient eat plenty of protein (eggs) and supplement with Ensure every day.  Discussed patient's comorbities. Recommend patient follow-up with her PCP and neurologist regularly.  Plan for follow-up PRN. Instructed patient to call us if she has any questions/concerns. Instructed patient to continue taking Eliquis 2.5 mg twice daily.  All questions answered and concerns addressed. Patient and husband convey understanding and agree with plan.  Thank you for this interesting consult.  I greatly enjoyed meeting Alyia Bonnin and look forward to participating in their care.  A copy of this report was sent to the requesting provider on this date.  Electronically Signed: Elwin Mocha, PA-C 11/28/2018, 9:13 AM   I spent a total of 25 Minutes in face to face in clinical consultation, greater than 50% of which was counseling/coordinating care for left MCA M2 segment occlusion s/p attempted revascularization.

## 2018-11-29 ENCOUNTER — Ambulatory Visit: Payer: Medicare Other

## 2018-11-29 ENCOUNTER — Ambulatory Visit: Payer: Medicare Other | Admitting: Occupational Therapy

## 2018-11-29 ENCOUNTER — Ambulatory Visit: Payer: Medicare Other | Admitting: Physical Therapy

## 2018-11-29 ENCOUNTER — Encounter: Payer: Self-pay | Admitting: Physical Therapy

## 2018-11-29 DIAGNOSIS — I69351 Hemiplegia and hemiparesis following cerebral infarction affecting right dominant side: Secondary | ICD-10-CM

## 2018-11-29 DIAGNOSIS — M6281 Muscle weakness (generalized): Secondary | ICD-10-CM | POA: Diagnosis not present

## 2018-11-29 DIAGNOSIS — R2681 Unsteadiness on feet: Secondary | ICD-10-CM | POA: Diagnosis not present

## 2018-11-29 DIAGNOSIS — R4701 Aphasia: Secondary | ICD-10-CM | POA: Diagnosis not present

## 2018-11-29 DIAGNOSIS — R41841 Cognitive communication deficit: Secondary | ICD-10-CM | POA: Diagnosis not present

## 2018-11-29 DIAGNOSIS — Z9181 History of falling: Secondary | ICD-10-CM

## 2018-11-29 DIAGNOSIS — I69315 Cognitive social or emotional deficit following cerebral infarction: Secondary | ICD-10-CM

## 2018-11-29 DIAGNOSIS — R293 Abnormal posture: Secondary | ICD-10-CM

## 2018-11-29 DIAGNOSIS — R2689 Other abnormalities of gait and mobility: Secondary | ICD-10-CM

## 2018-11-29 DIAGNOSIS — R482 Apraxia: Secondary | ICD-10-CM | POA: Diagnosis not present

## 2018-11-29 NOTE — Therapy (Signed)
[facility] [address] [place], [place], [address] Phone: [phone]   Fax:  [phone]  Occupational Therapy Treatment  Patient Details  Name: [name] MRN: [id number] Date of Birth: [birth year] No data recorded  Encounter Date: 11/29/2018  OT End of Session - 11/29/18 1455    Visit Number  6    Number of Visits  25    Date for OT Re-Evaluation  01/25/19    Authorization Type  Medicare and AARP supplement    Authorization - Visit Number  6    Authorization - Number of Visits  10    OT Start Time  1452    OT Stop Time  1515    OT Time Calculation (min)  23 min    Activity Tolerance  Patient tolerated treatment well    Behavior During Therapy  Flat affect;WFL for tasks assessed/performed       Past Medical History:  Diagnosis Date  . Atrial fibrillation (HCC)   . Chronic anticoagulation   . Chronic anticoagulation   . CVA (cerebrovascular accident) (HCC)   . Diabetes mellitus    pt & husband deny any knowledge of this   . History of chicken pox   . Hyperlipidemia   . Hypertension     Past Surgical History:  Procedure Laterality Date  . APPENDECTOMY    . BACK SURGERY    . CARDIAC CATHETERIZATION  11/09/91   EF 70%  . DILATION AND CURETTAGE OF UTERUS    . IR PERCUTANEOUS ART THROMBECTOMY/INFUSION INTRACRANIAL INC DIAG ANGIO  10/03/2018  . RADIOLOGY WITH ANESTHESIA N/A 10/03/2018   Procedure: IR WITH ANESTHESIA;  Surgeon: Radiologist, Medication, MD;  Location: [facility] OR;  Service: Radiology;  Laterality: N/A;    There were no vitals filed for this visit.  Subjective Assessment - 11/29/18 1454    Currently in Pain?  Yes    Pain Score  5     Pain Location  Hip    Pain Orientation  Left    Pain Descriptors / Indicators  Aching    Pain Type  Acute pain    Pain Onset  In the past 7 days    Pain Frequency  Constant    Aggravating Factors   moving    Pain Relieving Factors  rest                Treatment: Pt had a LOB when ambulating from ST off to gym requiring min A to recover. discussion with pt/ husband regarding activities pt is performing at home and husband reports she really hasn't been feeling well today. Pt's husband reports he hasn't really tried to have her wash dishes and fold laundry at home. Pt stood briefly and started folding clothing close supervision, mod v.c, she requested to sit because of abdominal pain, then she continued folding towels briefly . Pt requested to end treatment early.              OT Short Term Goals - 11/22/18 1427      OT SHORT TERM GOAL #1   Title  Patient will dress her upper body with modified independence due 11/26/18    Time  4    Period  Weeks    Status  On-going      OT SHORT TERM GOAL #2   Title  Patient will complete toilet hygiene with min assist    Status  Achieved      OT SHORT  TERM GOAL #3   Title  Patient will pull up and down clothing for toileting with modified independence    Status  Achieved      OT SHORT TERM GOAL #4   Title  Patient will use two hands to cut food on plate as needed    Status  Achieved      OT SHORT TERM GOAL #5   Title  Patient will don / doff LB clothing with no more than min assist    Status  Achieved        OT Long Term Goals - 11/22/18 1432      OT LONG TERM GOAL #6   Title  Patient will demonstrate 5 lb increase in grip strength in right hand    Baseline  10    Time  12    Period  Weeks    Status  Achieved            Plan - 11/29/18 1529    Clinical Impression Statement  Pt with limited participation today. Pt reports feeling weak and she has not eaten since breakfast. Pt was offered peanut butter crackers but she declined, pt requested to end session early.    Occupational Profile and client history currently impacting functional performance  wife, retired Medical laboratory scientific officer deficits (Please refer to evaluation for details):   ADL's;IADL's;Leisure;Social Participation    Rehab Potential  Good    OT Frequency  2x / week    OT Duration  12 weeks    OT Treatment/Interventions  Self-care/ADL training;DME and/or AE instruction;Balance training;Aquatic Therapy;Therapeutic activities;Therapeutic exercise;Cognitive remediation/compensation;Neuromuscular education;Functional Mobility Training;Visual/perceptual remediation/compensation;Patient/family education    Plan  stand tolerance, static to slightly dynamic stand balance, functional use of RUE - mid to high reach, visual perceptual skills- Check if patient folding laundry or washing dishes    Consulted and Agree with Plan of Care  Patient;Family member/caregiver    Family Member Consulted  Rocky Link - husband       Patient will benefit from skilled therapeutic intervention in order to improve the following deficits and impairments:  Decreased coordination, Improper body mechanics, Decreased endurance, Decreased safety awareness, Decreased activity tolerance, Decreased knowledge of precautions, Decreased balance, Decreased knowledge of use of DME, Impaired UE functional use, Decreased cognition, Decreased mobility, Impaired vision/preception, Decreased strength, Impaired tone, Impaired sensation  Visit Diagnosis: Muscle weakness (generalized)  Unsteadiness on feet  Cognitive social or emotional deficit following cerebral infarction    Problem List Patient Active Problem List   Diagnosis Date Noted  . Expressive aphasia 11/17/2018  . Gait disturbance, post-stroke 11/17/2018  . Hemiparesis affecting dominant side as late effect of cerebrovascular accident (HCC) 11/17/2018  . Aphasia, post-stroke 11/17/2018  . Aphasia as late effect of stroke 11/08/2018  . 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 Left middle cerebral artery stroke (HCC) 10/06/2018  . Dyslipidemia   . Diabetes mellitus type 2 in nonobese (HCC)   . Atrial fibrillation (HCC)   . Tachypnea   . Stage 3 chronic  kidney disease (HCC)   . Stroke [facility]) ischemic embolic L MCA  d/t AF 10/03/2018  . 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 Frequent falls 10/13/2017  . Vitamin D deficiency 04/22/2017  . Slurring of speech 03/31/2017  . Back pain 09/14/2016  . Compression fracture of thoracic vertebra (HCC) 09/14/2016  . S/P vertebroplasty 09/14/2016  . Chronic diastolic heart failure (HCC) 09/14/2016  . HTN (hypertension) 09/14/2016  . History of CVA (cerebrovascular accident)  09/14/2016  . Hypokalemia 09/11/2016  . Paraspinal hematoma 09/11/2016  . Osteoporosis 05/27/2016  . Hypothyroidism 09/11/2014  . Persistent atrial fibrillation (HCC) 12/15/2012  . HLD (hyperlipidemia) 03/27/2011    RINE,KATHRYN 11/29/2018, 3:33 PM Keene BreathKathryn Rine, OTR/L [phone] Phone: [phone] 4:44 PM 11/29/18 [facility] [address] [place], [place], [address] Phone: [phone]   Fax:  [phone]  Name: [name] MRN: [id number] Date of Birth: 03/09/1930

## 2018-11-29 NOTE — Therapy (Signed)
[facility] [address] [place], [place], [address] Phone: [phone]   Fax:  [phone]  Speech Language Pathology Treatment  Patient Details  Name: Pincus LargeCarolyn W White Reichard MRN: [id number] Date of Birth: 1930-08-25 Referring Provider (SLP): Dr. Wynn BankerKirsteins   Encounter Date: 11/29/2018  End of Session - 11/29/18 1642    Visit Number  10    Number of Visits  25    Date for SLP Re-Evaluation  01/25/19    Authorization Type  MCR    SLP Start Time  1405    SLP Stop Time   1445    SLP Time Calculation (min)  40 min    Activity Tolerance  Patient tolerated treatment well       Past Medical History:  Diagnosis Date  . Atrial fibrillation (HCC)   . Chronic anticoagulation   . Chronic anticoagulation   . CVA (cerebrovascular accident) (HCC)   . Diabetes mellitus    pt & husband deny any knowledge of this   . History of chicken pox   . Hyperlipidemia   . Hypertension     Past Surgical History:  Procedure Laterality Date  . APPENDECTOMY    . BACK SURGERY    . CARDIAC CATHETERIZATION  11/09/91   EF 70%  . DILATION AND CURETTAGE OF UTERUS    . IR PERCUTANEOUS ART THROMBECTOMY/INFUSION INTRACRANIAL INC DIAG ANGIO  10/03/2018  . RADIOLOGY WITH ANESTHESIA N/A 10/03/2018   Procedure: IR WITH ANESTHESIA;  Surgeon: Radiologist, Medication, MD;  Location: [facility] OR;  Service: Radiology;  Laterality: N/A;    There were no vitals filed for this visit.  Subjective Assessment - 11/29/18 1409    Subjective  "A Kleenex"    Patient is accompained by:  Family member   husband   Currently in Pain?  No/denies            ADULT SLP TREATMENT - 11/29/18 1431      General Information   Behavior/Cognition  Alert;Cooperative;Pleasant mood;Requires cueing      Treatment Provided   Treatment provided  Cognitive-Linquistic      Cognitive-Linquistic Treatment   Treatment focused on  Aphasia;Apraxia    Skilled Treatment   Husband comes in this afternoon frustrated that wife wanted to communicate about her hat this morning and they had an arguement because pt wanted husband to understand sooner and he did not. SLP told husband to write down words/concepts pt is attemptiing to communicate when she/they get frustrated, to track commonalities to assist with icon placement and/or generation. Pt's husband req'd min-mod cues occasionally faded to min cues occasionally with icon generation/modification. SLP rovided pt husband education re: pt will likely not be able to repeat words with good consistency ("Honey, say 'hat'.") at this time, and that the best/least frustrating way to communicate will likely be with AAC. SLP told husband to encourage pt to use SGD at home. Husband talking aobut taking pictures of every sweater so pt can point. SLP suggested using the colors icons and pt could indicate a color and "sweater" (for example) instead of pointing to a picture of a particular sweater on the SGD.      Assessment / Recommendations / Plan   Plan  Continue with current plan of care      Progression Toward Goals   Progression toward goals  Not progressing toward goals (comment)   husband programs but pt uses SGD minimally/not at all      SLP Education -  11/29/18 1641    Education Details  track communication breakdowns to understand trends    Person(s) Educated  Patient;Spouse    Methods  Explanation;Demonstration    Comprehension  Verbalized understanding;Need further instruction       SLP Short Term Goals - 11/29/18 1644      SLP SHORT TERM GOAL #1   Title  Pt will demo awareness of 50% of errors in a functional task with usual mod A (verbal/written cues).     Baseline  11-18-18    Time  2    Period  Weeks    Status  On-going      SLP SHORT TERM GOAL #2   Title  Pt will communicate preferences or needs (food, activities, medical etc) x 3 sessions with usual mod A (AAC if necessary).    Baseline  11-25-18  (vegetables)    Time  2    Period  Weeks    Status  On-going      SLP SHORT TERM GOAL #3   Title  Pt will communicate personal details (medical history, family, name, age, etc) with usual mod A x 3 sessions (AAC if necessary)    Time  2    Period  Weeks    Status  On-going       SLP Long Term Goals - 11/29/18 1645      SLP LONG TERM GOAL #1   Title  Pt will attempt error correction in 3/5 opportunities when cued appropriately by communication partner x 3 sessions.    Time  8    Period  Weeks    Status  On-going      SLP LONG TERM GOAL #2   Title  Family will demo understanding of at least 4 appropriate ways to enhance pt's comprehension/expression during 4 sessions.     Time  8    Period  Weeks    Status  On-going      SLP LONG TERM GOAL #3   Title  Pt will reduce social isolation risks by communicating social introductions, common social messages, and personal interests using multimodal communication with occasional min A from communication partner over 4 sessions.    Time  8    Period  Weeks    Status  On-going      SLP LONG TERM GOAL #4   Title  Pt/family will report improvement in understanding of pt's needs and requests than prior to ST.    Time  8    Period  Weeks    Status  On-going       Plan - 11/29/18 1642    Clinical Impression Statement  Patient presents with likely severe expressive aphasia, with expressive > receptive deficits. Pt cont today, rarely, with functional spontaneous 1-2 word utterances. Pt spoke less today than previous two sessions -SLP woenders if due to SLP (attempting to) incr'ing pt's error awareness last session. Pt appeared to understand she was not talking in a manner SLP/husband could understand. Husband continues to learn how to assist pt with SGD at home, however states pt's resistance is at the same level. Hopefully with more work on raising pt's awareness of errors she will be more apt to go to [place] at home, to communicate. Today  pt req'd max cues/hand over hand assist for touching appropriate icon when husband/SLP were programming SGD for clothing items. I recommend cont'd skilled ST to address incr'd pt awareness of verbal errors, to maximize communication and to train communication partner  for pt use of AAC for wants/needs, safety, independence and QOL.     Speech Therapy Frequency  2x / week    Duration  --   12 weeks or 25 total visits   Treatment/Interventions  Cognitive reorganization;Multimodal communcation approach;Compensatory strategies;Language facilitation;Compensatory techniques;Cueing hierarchy;Internal/external aids;Functional tasks;SLP instruction and feedback;Patient/family education    Potential to Achieve Goals  Good    Potential Considerations  Cooperation/participation level;Severity of impairments;Ability to learn/carryover information       Patient will benefit from skilled therapeutic intervention in order to improve the following deficits and impairments:   Aphasia  Cognitive communication deficit    Problem List Patient Active Problem List   Diagnosis Date Noted  . Expressive aphasia 11/17/2018  . Gait disturbance, post-stroke 11/17/2018  . Hemiparesis affecting dominant side as late effect of cerebrovascular accident (HCC) 11/17/2018  . Aphasia, post-stroke 11/17/2018  . Aphasia as late effect of stroke 11/08/2018  . 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 Left middle cerebral artery stroke (HCC) 10/06/2018  . Dyslipidemia   . Diabetes mellitus type 2 in nonobese (HCC)   . Atrial fibrillation (HCC)   . Tachypnea   . Stage 3 chronic kidney disease (HCC)   . Stroke [facility]) ischemic embolic L MCA  d/t AF 10/03/2018  . 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 Frequent falls 10/13/2017  . Vitamin D deficiency 04/22/2017  . Slurring of speech 03/31/2017  . Back pain 09/14/2016  . Compression fracture of thoracic vertebra (HCC) 09/14/2016  . S/P vertebroplasty 09/14/2016  .  Chronic diastolic heart failure (HCC) 09/14/2016  . HTN (hypertension) 09/14/2016  . History of CVA (cerebrovascular accident) 09/14/2016  . Hypokalemia 09/11/2016  . Paraspinal hematoma 09/11/2016  . Osteoporosis 05/27/2016  . Hypothyroidism 09/11/2014  . Persistent atrial fibrillation (HCC) 12/15/2012  . HLD (hyperlipidemia) 03/27/2011    [facility] ,MS, CCC-SLP  11/29/2018, 4:46 PM  [facility] [address] [place], [place], [address] Phone: [phone]   Fax:  [phone]   Name: Anniston Kleinke MRN: [id number] Date of Birth: Dec 19, 1929

## 2018-11-29 NOTE — Therapy (Signed)
[facility] [address] [place], [place], [address] Phone: [phone]   Fax:  [phone]  Physical Therapy Treatment  Patient Details  Name: [name] MRN: [id number] Date of Birth: 1930-06-13 Referring Provider (PT): Alysia Penna, MD   Encounter Date: 11/29/2018  PT End of Session - 11/29/18 1322    Visit Number  6    Number of Visits  25    Date for PT Re-Evaluation  01/24/19    Authorization Type  Medicare & AARP  100% covered;  follow Medicare guidelines    PT Start Time  1318    PT Stop Time  1357    PT Time Calculation (min)  39 min    Equipment Utilized During Treatment  Gait belt    Activity Tolerance  Patient tolerated treatment well;Patient limited by fatigue;Patient limited by pain   limited by knee pain today   Behavior During Therapy  Flat affect;WFL for tasks assessed/performed       Past Medical History:  Diagnosis Date  . Atrial fibrillation ([place])   . Chronic anticoagulation   . Chronic anticoagulation   . CVA (cerebrovascular accident) ([place])   . Diabetes mellitus    pt & husband deny any knowledge of this   . History of chicken pox   . Hyperlipidemia   . Hypertension     Past Surgical History:  Procedure Laterality Date  . APPENDECTOMY    . BACK SURGERY    . CARDIAC CATHETERIZATION  11/09/91   EF 70%  . DILATION AND CURETTAGE OF UTERUS    . IR PERCUTANEOUS ART THROMBECTOMY/INFUSION INTRACRANIAL INC DIAG ANGIO  10/03/2018  . RADIOLOGY WITH ANESTHESIA N/A 10/03/2018   Procedure: IR WITH ANESTHESIA;  Surgeon: Radiologist, Medication, MD;  Location: [facility];  Service: Radiology;  Laterality: N/A;    There were no vitals filed for this visit.  Subjective Assessment - 11/29/18 1322    Subjective  No new falls. Still with left LE soreness, no pain.     Patient is accompained by:  Family member    Pertinent History  HTN, DM, CVA 98yr ago, A-Fib, chronic LBP     Limitations  Lifting;Standing;Walking;House hold activities    Patient Stated Goals  To talk better, to walk & balance better.    Currently in Pain?  No/denies    Pain Score  0-No pain             [facility] Adult PT Treatment/Exercise - 11/29/18 1323      Transfers   Transfers  Sit to Stand;Stand to Sit    Sit to Stand  5: Supervision;From bed;From chair/3-in-1;With upper extremity assist    Stand to Sit  5: Supervision;To bed;To chair/3-in-1;With upper extremity assist      Ambulation/Gait   Ambulation/Gait  Yes    Ambulation/Gait Assistance  5: Supervision;4: Min guard;4: Min assist    Ambulation/Gait Assistance Details  cues for posture and incr step length with all gait. min HHA needed for gait without RW,     Ambulation Distance (Feet)  25 Feet   x 4 with HHA; around gym with RW   Assistive device  Rolling walker;1 person hand held assist    Gait Pattern  Step-through pattern;Decreased stride length;Trunk flexed;Narrow base of support    Ambulation Surface  Level;Indoor          Balance Exercises - 11/29/18 1331      Balance Exercises: Standing   Rockerboard  Lateral;Anterior/posterior;EO;10  reps;Intermittent UE support    Balance Beam  standing across blue foam beam: fwd stepping to floor/back onto beam with bil UE support, progressing to single UE support; static stand on blue beam with feet apart: worked on letting go of bars with hands to sides, max time of 30 sec's during which added head rotation left to right, max encouragement and cues.        Balance Exercises: Standing   Rebounder Limitations  performed both ways on balance board: rocking the board  with emphasis on tall posture/weight shifting with bil UE support progressing to hands hovering over bars; holding the board steady- alternating UE raises, then bil UE raises x 10 reps each; worked toward no UE support with hands at sides for max of 10 sec's, max encouragement needed with min guard to min assist for  balance.                                      PT Short Term Goals - 11/22/18 1452      PT SHORT TERM GOAL #1   Title  Patient's husband verbalizes fall risk prevention strategies. (All STGs Target Date: 11/25/2018)    Baseline  11/22/18: met per spouse report.     Status  Achieved      PT SHORT TERM GOAL #2   Title  patient ambulates 100' around furniture with RW with supervision.     Baseline  11/22/18: met today    Time  --    Period  --    Status  Achieved      PT SHORT TERM GOAL #3   Title  Patient sit to/from stand from chairs with armrests without touching external support to stabilize with supervision.     Baseline  11/22/18: met today    Time  --    Period  --    Status  Achieved      PT SHORT TERM GOAL #4   Title  Patient & husband demonstrate understanding of initial HEP.     Baseline  11/22/18: met with current HEP    Status  Achieved        PT Long Term Goals - 10/27/18 1008      PT LONG TERM GOAL #1   Title  Patient & husband verbalize understanding of fall prevention strategies including appropriate assistive device. (All LTGs Target Date: 01/20/2019)    Time  12    Period  Weeks    Status  New    Target Date  01/20/19      PT LONG TERM GOAL #2   Title  Berg Balance >36/56 to indicate lower fall risk.     Time  12    Period  Weeks    Status  New    Target Date  01/20/19      PT LONG TERM GOAL #3   Title  Timed Up & Go with LRAD modified independent <13.5 seconds    Time  12    Period  Weeks    Status  New    Target Date  01/20/19      PT LONG TERM GOAL #4   Title  Patient ambulates 100' around furniture with LRAD with no balance losses with supervision for cognitive deficits only to enable safe mobility within her home without assistance.     Time  12    Period  Weeks  Status  New    Target Date  01/20/19      PT LONG TERM GOAL #5   Title  Patient ambulates 400' outdoors on paved surfaces with LRAD with supervision for community mobility.      Time  12    Period  Weeks    Status  New    Target Date  01/20/19      Additional Long Term Goals   Additional Long Term Goals  Yes      PT LONG TERM GOAL #6   Title  Patient negotiates ramps, curbs & 2 steps similar to home entrance with LRAD with supervision for safe community access.    Time  12    Period  Weeks    Status  New    Target Date  01/20/19      PT LONG TERM GOAL #7   Title  Patient and husband demonstrate & verbalize understanding of ongoing HEP & fitness plan.     Time  12    Period  Weeks    Status  New    Target Date  01/20/19            Plan - 11/29/18 1322    Clinical Impression Statement  Today's skilled session continued to address LE strengthening and balance reactions. Also began to address gait with less UE support. Pt limited by left LE pain with incr activity, needing short rest breaks throughout session to allow pain to subside. Pt also has a tendency to say "I can't do that" when presented with new challagenes/tasks, needs encouragement to try with assit. The pt is progressing toward goals and should benefit from continued PT to progress toward unmet goals.     Rehab Potential  Good    PT Frequency  2x / week    PT Duration  12 weeks    PT Treatment/Interventions  ADLs/Self Care Home Management;DME Instruction;Gait training;Stair training;Functional mobility training;Therapeutic activities;Therapeutic exercise;Balance training;Neuromuscular re-education;Patient/family education;Vestibular    PT Next Visit Plan  continue to work on LE strengthening, balance reactions and gait with less UE support.     PT Home Exercise Plan  [email or web address]     Consulted and Agree with Plan of Care  Patient;Family member/caregiver    Family Member Consulted  husband, [name]       Patient will benefit from skilled therapeutic intervention in order to improve the following deficits and impairments:  Abnormal gait, Decreased activity tolerance, Decreased balance,  Decreased cognition, Decreased coordination, Decreased endurance, Decreased knowledge of use of DME, Decreased mobility, Decreased strength, Dizziness, Postural dysfunction, Pain  Visit Diagnosis: Muscle weakness (generalized)  Unsteadiness on feet  Hemiplegia and hemiparesis following cerebral infarction affecting right dominant side (HCC)  Abnormal posture  Other abnormalities of gait and mobility  History of falling     Problem List Patient Active Problem List   Diagnosis Date Noted  . Expressive aphasia 11/17/2018  . Gait disturbance, post-stroke 11/17/2018  . Hemiparesis affecting dominant side as late effect of cerebrovascular accident ([place]) 11/17/2018  . Aphasia, post-stroke 11/17/2018  . Aphasia as late effect of stroke 11/08/2018  . 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 Left middle cerebral artery stroke ([place]) 10/06/2018  . Dyslipidemia   . Diabetes mellitus type 2 in nonobese (HCC)   . Atrial fibrillation ([place])   . Tachypnea   . Stage 3 chronic kidney disease ([place])   . Stroke [facility]) ischemic embolic L MCA  d/t AF 51/70/0174  . Middle cerebral artery  embolism, left 10/03/2018  . Frequent falls 10/13/2017  . Vitamin D deficiency 04/22/2017  . Slurring of speech 03/31/2017  . Back pain 09/14/2016  . Compression fracture of thoracic vertebra (HCC) 09/14/2016  . S/P vertebroplasty 09/14/2016  . Chronic diastolic heart failure ([place]) 09/14/2016  . HTN (hypertension) 09/14/2016  . History of CVA (cerebrovascular accident) 09/14/2016  . Hypokalemia 09/11/2016  . Paraspinal hematoma 09/11/2016  . Osteoporosis 05/27/2016  . Hypothyroidism 09/11/2014  . Persistent atrial fibrillation ([place]) 12/15/2012  . HLD (hyperlipidemia) 03/27/2011    Willow Ora, PTA, [facility] [address] [place], [place] [address] [phone] 11/29/18, 4:19 PM   Name: [name] MRN: [id number] Date of Birth: [birth year]

## 2018-12-01 DIAGNOSIS — M25562 Pain in left knee: Secondary | ICD-10-CM | POA: Diagnosis not present

## 2018-12-02 ENCOUNTER — Ambulatory Visit: Payer: Medicare Other | Admitting: Rehabilitative and Restorative Service Providers"

## 2018-12-02 ENCOUNTER — Ambulatory Visit: Payer: Medicare Other | Admitting: Occupational Therapy

## 2018-12-02 ENCOUNTER — Ambulatory Visit: Payer: Medicare Other

## 2018-12-05 ENCOUNTER — Telehealth: Payer: Self-pay | Admitting: Cardiovascular Disease

## 2018-12-05 MED ORDER — APIXABAN 2.5 MG PO TABS: 2.5000 mg | ORAL_TABLET | Freq: Two times a day (BID) | ORAL | 1 refills | Status: DC

## 2018-12-05 NOTE — Telephone Encounter (Signed)
New Message   PTs husband is calling. He has questions he would like to ask Cash. Please call

## 2018-12-05 NOTE — Telephone Encounter (Signed)
Spoke with husband and he needs Eliquis sent to [facility]. Verified dose as 2.5 mg twice a day. Will route to anitgcoag clinic for refill

## 2018-12-06 ENCOUNTER — Ambulatory Visit: Payer: Medicare Other | Admitting: Occupational Therapy

## 2018-12-06 ENCOUNTER — Ambulatory Visit: Payer: Medicare Other

## 2018-12-06 ENCOUNTER — Ambulatory Visit: Payer: Medicare Other | Admitting: Physical Therapy

## 2018-12-07 ENCOUNTER — Telehealth: Payer: Self-pay

## 2018-12-07 NOTE — Telephone Encounter (Signed)
Spoke with Dr. Everardo All. He would prefer pt call to schedule once feeling better or up to receiving injection.

## 2018-12-07 NOTE — Telephone Encounter (Signed)
OK 

## 2018-12-07 NOTE — Telephone Encounter (Signed)
Patient has had a stroke and back issues and will not be coming in to get Prolia-spoke with the husband and he declined to schedule nurse visit for her to get Prolia- I will archive this patient for now-let me know how you would like to proceed

## 2018-12-08 ENCOUNTER — Telehealth: Payer: Self-pay | Admitting: Neurology

## 2018-12-08 NOTE — Telephone Encounter (Signed)
I spoke with the pt's husband. He stated that pt had bad back pain before her stroke and was on oxycodone. After he stroke she didn't have pain. However recently, about a week ago, she fell down (forward) and fell on her knee. She was ok at first but now she's having such bad back pain he can't get her around the house. I advised him to call PCP or if she is unable to even ambulate then she needs be taken to a hospital. He stated that he called ortho and they told him to call neurology. I discussed that our office has only seen her for the stroke and we do not treat her back pain. His questions were answered. He verbalized understanding and appreciation. I discussed with Dr. Lucia Gaskins who agreed with plan.

## 2018-12-08 NOTE — Telephone Encounter (Signed)
Pt husband(on DPR) has called to inform that pt is having back and leg pain preventing her from being able to walk. Husband very concerned and is asking for a call from RN

## 2018-12-09 ENCOUNTER — Ambulatory Visit: Payer: Medicare Other | Admitting: Physical Therapy

## 2018-12-09 ENCOUNTER — Encounter: Payer: Medicare Other | Admitting: Occupational Therapy

## 2018-12-09 ENCOUNTER — Ambulatory Visit: Payer: Medicare Other

## 2018-12-12 ENCOUNTER — Ambulatory Visit: Payer: Medicare Other | Admitting: Occupational Therapy

## 2018-12-12 ENCOUNTER — Ambulatory Visit: Payer: Medicare Other | Admitting: Physical Therapy

## 2018-12-12 ENCOUNTER — Ambulatory Visit: Payer: Medicare Other | Admitting: Speech Pathology

## 2018-12-12 DIAGNOSIS — M6281 Muscle weakness (generalized): Secondary | ICD-10-CM | POA: Diagnosis not present

## 2018-12-12 DIAGNOSIS — I69351 Hemiplegia and hemiparesis following cerebral infarction affecting right dominant side: Secondary | ICD-10-CM | POA: Diagnosis not present

## 2018-12-12 DIAGNOSIS — R2681 Unsteadiness on feet: Secondary | ICD-10-CM | POA: Diagnosis not present

## 2018-12-12 DIAGNOSIS — R482 Apraxia: Secondary | ICD-10-CM

## 2018-12-12 DIAGNOSIS — R4701 Aphasia: Secondary | ICD-10-CM | POA: Diagnosis not present

## 2018-12-12 DIAGNOSIS — R41841 Cognitive communication deficit: Secondary | ICD-10-CM | POA: Diagnosis not present

## 2018-12-12 NOTE — Therapy (Signed)
[facility] [address] [place], [place], [address] Phone: [phone]   Fax:  [phone]  Speech Language Pathology Treatment  Patient Details  Name: [name] MRN: [id number] Date of Birth: 1930/02/27 Referring Provider (SLP): Dr. Wynn Banker   Encounter Date: 12/12/2018  End of Session - 12/12/18 1436    Visit Number  11    Number of Visits  25    Date for SLP Re-Evaluation  01/25/19    Authorization Type  MCR    Authorization Time Period  90 days    SLP Start Time  1316    SLP Stop Time   1412    SLP Time Calculation (min)  56 min    Activity Tolerance  Patient tolerated treatment well       Past Medical History:  Diagnosis Date  . Atrial fibrillation (HCC)   . Chronic anticoagulation   . Chronic anticoagulation   . CVA (cerebrovascular accident) (HCC)   . Diabetes mellitus    pt & husband deny any knowledge of this   . History of chicken pox   . Hyperlipidemia   . Hypertension     Past Surgical History:  Procedure Laterality Date  . APPENDECTOMY    . BACK SURGERY    . CARDIAC CATHETERIZATION  11/09/91   EF 70%  . DILATION AND CURETTAGE OF UTERUS    . IR PERCUTANEOUS ART THROMBECTOMY/INFUSION INTRACRANIAL INC DIAG ANGIO  10/03/2018  . RADIOLOGY WITH ANESTHESIA N/A 10/03/2018   Procedure: IR WITH ANESTHESIA;  Surgeon: Radiologist, Medication, MD;  Location[facility];  Service: Radiology;  Laterality: N/A;    There were no vitals filed for this visit.  Subjective Assessment - 12/12/18 1317    Subjective  "Much better."    Patient is accompained by:  --   husband   Currently in Pain?  Yes    Pain Score  8     Pain Location  Back    Pain Descriptors / Indicators  Aching    Pain Type  Acute pain            ADULT SLP TREATMENT - 12/12/18 1316      General Information   Behavior/Cognition  Alert;Cooperative;Pleasant mood;Requires cueing      Treatment Provided   Treatment  provided  Cognitive-Linquistic      Cognitive-Linquistic Treatment   Treatment focused on  Aphasia;Apraxia    Skilled Treatment  Husband continues to program SGD at home. SLP navigated to "family" section of device and asked pt to talk about her family. Pt initially points at icons without activating. SLP provided hand-over-hand assist/ verbal cues. Pt interaction with icons was far better with stylus; after brief modeling/tactile assist, pt selected icons with occasional min A. SLP demo'd using the device in a functional scenario, making a grocery list. Total A for navigation, however within categories pt selected 15+ items for a grocery list. Husband told SLP about a communication breakdown re: food choices (TV dinners). SLP created a new category for TV dinners and took photographs of several varieties from pt's preferred brand. SLP demo'd creating 2 icons, then husband created icons with occasional min A initially, with SLP able to fade to supervision for last 2 icons created. Demo'd use of microphone for speech-to-text. Husband continues to request pt repeat words and ask SLP about pt "getting her words out right." SLP re-educated re: aphasia and that it is likely unreasonable and frustrating for pt to repeat words  consistently. Educated re: multimodal communication with focus on pt's message vs accuracy of words. Encouraged husband to accept any form of communication as long as pt gets her message across. SLP created an icon in pt's "Me" section for husband to easily access "voice recorder" to record pt's speech to help with awareness of errors. SLP recorded pt's answer to simple biographical questions. Audio feedback improved pt's awareness of jargon/paraphasias when stating her birth month, but not of incorrect production of husband's name.      Assessment / Recommendations / Plan   Plan  Continue with current plan of care      Progression Toward Goals   Progression toward goals  Progressing toward  goals       SLP Education - 12/12/18 1436    Education Details  be willing to accept any form of communication    Person(s) Educated  Patient;Spouse    Methods  Explanation;Demonstration    Comprehension  Verbalized understanding;Need further instruction       SLP Short Term Goals - 12/12/18 1439      SLP SHORT TERM GOAL #1   Title  Pt will demo awareness of 50% of errors in a functional task with usual mod A (verbal/written cues).     Time  1    Period  Weeks    Status  On-going      SLP SHORT TERM GOAL #2   Title  Pt will communicate preferences or needs (food, activities, medical etc) x 3 sessions with usual mod A (AAC if necessary).    Baseline  11-25-18 (vegetables), 12-12-18 (fruits, vegetables, tv dinners)    Time  1    Period  Weeks    Status  On-going      SLP SHORT TERM GOAL #3   Title  Pt will communicate personal details (medical history, family, name, age, etc) with usual mod A x 3 sessions (AAC if necessary)    Baseline  pain level 12-12-18    Time  1    Period  Weeks    Status  On-going       SLP Long Term Goals - 12/12/18 1440      SLP LONG TERM GOAL #1   Title  Pt will attempt error correction in 3/5 opportunities when cued appropriately by communication partner x 3 sessions.    Time  7    Period  Weeks    Status  On-going      SLP LONG TERM GOAL #2   Title  Family will demo understanding of at least 4 appropriate ways to enhance pt's comprehension/expression during 4 sessions.     Time  7    Period  Weeks    Status  On-going      SLP LONG TERM GOAL #3   Title  Pt will reduce social isolation risks by communicating social introductions, common social messages, and personal interests using multimodal communication with occasional min A from communication partner over 4 sessions.    Time  7    Period  Weeks    Status  On-going      SLP LONG TERM GOAL #4   Title  Pt/family will report improvement in understanding of pt's needs and requests than prior  to ST.    Time  7    Period  Weeks    Status  On-going       Plan - 12/12/18 1437    Clinical Impression Statement  Patient presents with likely severe  expressive aphasia, with expressive > receptive deficits. Pt cont today, rarely, with functional spontaneous 1-2 word utterances. Pt appeared to understand she was not talking in a manner SLP/husband could understand. Husband continues to learn how to assist pt with SGD at home; pt less resistant to using device during today's session. Hopefully with more work on raising pt's awareness of errors she will be more apt to go to [place] at home, to communicate. Initially hand-over-hand assist required for interacting with icons, but SLP able to fade support to occasional min A, with pt selecting food preferences using stylus. I recommend cont'd skilled ST to address incr'd pt awareness of verbal errors, to maximize communication and to train communication partner for pt use of AAC for wants/needs, safety, independence and QOL.     Speech Therapy Frequency  2x / week    Duration  --   12 weeks or 25 total visits   Treatment/Interventions  Cognitive reorganization;Multimodal communcation approach;Compensatory strategies;Language facilitation;Compensatory techniques;Cueing hierarchy;Internal/external aids;Functional tasks;SLP instruction and feedback;Patient/family education    Potential to Achieve Goals  Good    Potential Considerations  Cooperation/participation level;Severity of impairments;Ability to learn/carryover information       Patient will benefit from skilled therapeutic intervention in order to improve the following deficits and impairments:   Aphasia  Apraxia    Problem List Patient Active Problem List   Diagnosis Date Noted  . Expressive aphasia 11/17/2018  . Gait disturbance, post-stroke 11/17/2018  . Hemiparesis affecting dominant side as late effect of cerebrovascular accident (HCC) 11/17/2018  . Aphasia, post-stroke  11/17/2018  . Aphasia as late effect of stroke 11/08/2018  . 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 Left middle cerebral artery stroke (HCC) 10/06/2018  . Dyslipidemia   . Diabetes mellitus type 2 in nonobese (HCC)   . Atrial fibrillation (HCC)   . Tachypnea   . Stage 3 chronic kidney disease (HCC)   . Stroke [facility]) ischemic embolic L MCA  d/t AF 10/03/2018  . 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 Frequent falls 10/13/2017  . Vitamin D deficiency 04/22/2017  . Slurring of speech 03/31/2017  . Back pain 09/14/2016  . Compression fracture of thoracic vertebra (HCC) 09/14/2016  . S/P vertebroplasty 09/14/2016  . Chronic diastolic heart failure (HCC) 09/14/2016  . HTN (hypertension) 09/14/2016  . History of CVA (cerebrovascular accident) 09/14/2016  . Hypokalemia 09/11/2016  . Paraspinal hematoma 09/11/2016  . Osteoporosis 05/27/2016  . Hypothyroidism 09/11/2014  . Persistent atrial fibrillation (HCC) 12/15/2012  . HLD (hyperlipidemia) 03/27/2011   Rondel Baton, MS, CCC-SLP Speech-Language Pathologist  Arlana Lindau 12/12/2018, 2:41 PM  [facility] [address] [place], [place], [address] Phone: [phone]   Fax:  [phone]   Name: [name] MRN: [id number] Date of Birth: 03-11-1930

## 2018-12-13 ENCOUNTER — Encounter: Payer: Medicare Other | Admitting: Occupational Therapy

## 2018-12-13 ENCOUNTER — Ambulatory Visit: Payer: Medicare Other | Admitting: Physical Therapy

## 2018-12-13 ENCOUNTER — Ambulatory Visit: Payer: Medicare Other | Admitting: Speech Pathology

## 2018-12-13 ENCOUNTER — Telehealth: Payer: Self-pay | Admitting: Physical Therapy

## 2018-12-13 ENCOUNTER — Encounter: Payer: Self-pay | Admitting: Speech Pathology

## 2018-12-13 DIAGNOSIS — R4701 Aphasia: Secondary | ICD-10-CM | POA: Diagnosis not present

## 2018-12-13 DIAGNOSIS — M6281 Muscle weakness (generalized): Secondary | ICD-10-CM | POA: Diagnosis not present

## 2018-12-13 DIAGNOSIS — M545 Low back pain: Secondary | ICD-10-CM | POA: Diagnosis not present

## 2018-12-13 DIAGNOSIS — R2681 Unsteadiness on feet: Secondary | ICD-10-CM | POA: Diagnosis not present

## 2018-12-13 DIAGNOSIS — M25552 Pain in left hip: Secondary | ICD-10-CM | POA: Diagnosis not present

## 2018-12-13 DIAGNOSIS — R41841 Cognitive communication deficit: Secondary | ICD-10-CM | POA: Diagnosis not present

## 2018-12-13 DIAGNOSIS — R482 Apraxia: Secondary | ICD-10-CM | POA: Diagnosis not present

## 2018-12-13 DIAGNOSIS — I69351 Hemiplegia and hemiparesis following cerebral infarction affecting right dominant side: Secondary | ICD-10-CM | POA: Diagnosis not present

## 2018-12-13 NOTE — Patient Instructions (Signed)
    Therapy Activities  Therapy Exercises  Completing a Phrase  Verb and Object   Easy

## 2018-12-13 NOTE — Telephone Encounter (Signed)
Patient fell ~12/01/2018. She has been unable to tolerate weight bearing since the fall. Her husband reports that he has called orthopedics, neurology & PCP who have referred her elsewhere.  Patient used Public affairs consultant with speech therapist to report hip, back & knee pain 8/10.  Vladimir Faster, PT called Dr. Venora Maples office at [facility] and spoke to nurse.  They scheduled to see patient today for X-ray to rule out hip fracture.

## 2018-12-13 NOTE — Therapy (Signed)
[facility] [address] [place], [place], [address] Phone: [phone]   Fax:  [phone]  Speech Language Pathology Treatment  Patient Details  Name: [name] MRN: [id number] Date of Birth: [birth year] Referring Provider (SLP): Dr. Wynn Banker   Encounter Date: 12/13/2018  End of Session - 12/13/18 1500    Visit Number  12    Number of Visits  25    Date for SLP Re-Evaluation  01/25/19    Authorization Type  MCR    Authorization Time Period  90 days    SLP Start Time  1359    SLP Stop Time   1445    SLP Time Calculation (min)  46 min       Past Medical History:  Diagnosis Date  . Atrial fibrillation (HCC)   . Chronic anticoagulation   . Chronic anticoagulation   . CVA (cerebrovascular accident) (HCC)   . Diabetes mellitus    pt & husband deny any knowledge of this   . History of chicken pox   . Hyperlipidemia   . Hypertension     Past Surgical History:  Procedure Laterality Date  . APPENDECTOMY    . BACK SURGERY    . CARDIAC CATHETERIZATION  11/09/91   EF 70%  . DILATION AND CURETTAGE OF UTERUS    . IR PERCUTANEOUS ART THROMBECTOMY/INFUSION INTRACRANIAL INC DIAG ANGIO  10/03/2018  . RADIOLOGY WITH ANESTHESIA N/A 10/03/2018   Procedure: IR WITH ANESTHESIA;  Surgeon: Radiologist, Medication, MD;  Location: [facility] OR;  Service: Radiology;  Laterality: N/A;    There were no vitals filed for this visit.  Subjective Assessment - 12/13/18 1453    Subjective  "My daughter put on some restaurants"    Patient is accompained by:  --   husband   Currently in Pain?  No/denies    Pain Score  8     Pain Location  Back    Pain Orientation  Left    Pain Descriptors / Indicators  Aching    Pain Type  Acute pain    Pain Onset  1 to 4 weeks ago    Multiple Pain Sites  Yes    Pain Score  8    Pain Location  Hip    Pain Orientation  Left    Pain Descriptors / Indicators  Aching    Pain Score  8     Pain Location  Knee    Pain Orientation  Left    Pain Descriptors / Indicators  Aching    Pain Type  Acute pain    Pain Onset  1 to 4 weeks ago    Pain Frequency  Constant    Aggravating Factors   moving    Pain Relieving Factors  rest            ADULT SLP TREATMENT - 12/13/18 1456      General Information   Behavior/Cognition  Alert;Cooperative;Pleasant mood;Requires cueing      Treatment Provided   Treatment provided  Cognitive-Linquistic      Cognitive-Linquistic Treatment   Treatment focused on  Aphasia;Apraxia;Patient/family/caregiver education    Skilled Treatment  Pt pointed to each family member to activate speech with min A after ST rotated stylus correctly. She also activated favorite restaurants after I showed husband how to move her favorite restaurants to the top of the page and delete the ones they don't go to. Pt utilized pain score on SGD to indicate an 8  and utilized written cues to ID pain in her back, hip and knee.       Assessment / Recommendations / Plan   Plan  Continue with current plan of care      Progression Toward Goals   Progression toward goals  Progressing toward goals       SLP Education - 12/13/18 1458    Education Details  use writing at word level to augment communication and comprehension    Person(s) Educated  Patient;Spouse    Methods  Explanation;Demonstration;Verbal cues    Comprehension  Need further instruction       SLP Short Term Goals - 12/13/18 1500      SLP SHORT TERM GOAL #1   Title  Pt will demo awareness of 50% of errors in a functional task with usual mod A (verbal/written cues).     Time  1    Period  Weeks    Status  On-going      SLP SHORT TERM GOAL #2   Title  Pt will communicate preferences or needs (food, activities, medical etc) x 3 sessions with usual mod A (AAC if necessary).    Baseline  11-25-18 (vegetables), 12-12-18 (fruits, vegetables, tv dinners)    Time  1    Period  Weeks    Status  On-going       SLP SHORT TERM GOAL #3   Title  Pt will communicate personal details (medical history, family, name, age, etc) with usual mod A x 3 sessions (AAC if necessary)    Baseline  pain level 12-12-18    Time  1    Period  Weeks    Status  On-going       SLP Long Term Goals - 12/13/18 1500      SLP LONG TERM GOAL #1   Title  Pt will attempt error correction in 3/5 opportunities when cued appropriately by communication partner x 3 sessions.    Time  7    Period  Weeks    Status  On-going      SLP LONG TERM GOAL #2   Title  Family will demo understanding of at least 4 appropriate ways to enhance pt's comprehension/expression during 4 sessions.     Time  7    Period  Weeks    Status  On-going      SLP LONG TERM GOAL #3   Title  Pt will reduce social isolation risks by communicating social introductions, common social messages, and personal interests using multimodal communication with occasional min A from communication partner over 4 sessions.    Time  7    Period  Weeks    Status  On-going      SLP LONG TERM GOAL #4   Title  Pt/family will report improvement in understanding of pt's needs and requests than prior to ST.    Time  7    Period  Weeks    Status  On-going       Plan - 12/13/18 1459    Clinical Impression Statement  Patient presents with likely severe expressive aphasia, with expressive > receptive deficits. Pt cont today, rarely, with functional spontaneous 1-2 word utterances. Pt appeared to understand she was not talking in a manner SLP/husband could understand. Husband continues to learn how to assist pt with SGD at home; pt less resistant to using device during today's session. Hopefully with more work on raising pt's awareness of errors she will be more apt to  go to [place] at home, to communicate. Initially hand-over-hand assist required for interacting with icons, but SLP able to fade support to occasional min A, with pt selecting food preferences using stylus. I  recommend cont'd skilled ST to address incr'd pt awareness of verbal errors, to maximize communication and to train communication partner for pt use of AAC for wants/needs, safety, independence and QOL.     Speech Therapy Frequency  2x / week    Duration  --   12 weeks or 25 visits   Treatment/Interventions  Cognitive reorganization;Multimodal communcation approach;Compensatory strategies;Language facilitation;Compensatory techniques;Cueing hierarchy;Internal/external aids;Functional tasks;SLP instruction and feedback;Patient/family education    Potential to Achieve Goals  Good    Potential Considerations  Cooperation/participation level;Severity of impairments;Ability to learn/carryover information    Consulted and Agree with Plan of Care  Patient;Family member/caregiver    Family Member Consulted  Husband Rocky Link       Patient will benefit from skilled therapeutic intervention in order to improve the following deficits and impairments:   Aphasia    Problem List Patient Active Problem List   Diagnosis Date Noted  . Expressive aphasia 11/17/2018  . Gait disturbance, post-stroke 11/17/2018  . Hemiparesis affecting dominant side as late effect of cerebrovascular accident (HCC) 11/17/2018  . Aphasia, post-stroke 11/17/2018  . Aphasia as late effect of stroke 11/08/2018  . 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 Left middle cerebral artery stroke (HCC) 10/06/2018  . Dyslipidemia   . Diabetes mellitus type 2 in nonobese (HCC)   . Atrial fibrillation (HCC)   . Tachypnea   . Stage 3 chronic kidney disease (HCC)   . Stroke [facility]) ischemic embolic L MCA  d/t AF 10/03/2018  . 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 Frequent falls 10/13/2017  . Vitamin D deficiency 04/22/2017  . Slurring of speech 03/31/2017  . Back pain 09/14/2016  . Compression fracture of thoracic vertebra (HCC) 09/14/2016  . S/P vertebroplasty 09/14/2016  . Chronic diastolic heart failure (HCC) 09/14/2016  . HTN  (hypertension) 09/14/2016  . History of CVA (cerebrovascular accident) 09/14/2016  . Hypokalemia 09/11/2016  . Paraspinal hematoma 09/11/2016  . Osteoporosis 05/27/2016  . Hypothyroidism 09/11/2014  . Persistent atrial fibrillation (HCC) 12/15/2012  . HLD (hyperlipidemia) 03/27/2011    Tinleigh Whitmire, Radene Journey MS, CCC-SLP 12/13/2018, 3:01 PM  [facility] [address] [place], [place], [address] Phone: [phone]   Fax:  [phone]   Name: [name] MRN: [id number] Date of Birth: 12-12-1929

## 2018-12-15 ENCOUNTER — Ambulatory Visit: Payer: Medicare Other

## 2018-12-15 NOTE — Progress Notes (Deleted)
     12/15/2018 Scheryl Sol [place] [birth year] [phone]   HPI:  [name] is a 85 y.o. female patient of Dr Duke Salvia, with a PMH below who presents today for hypertension clinic evaluation.  In addition to hypertension, her medical history is significant for persistent atrial fibrillation, chronic diastolic heart failure, hyperlipidemia, hypothyroidism, CVA (first was approx 10 years ago, second 09/2018 d/t AF, INR therapeutic on admit) with post stroke aphasia and gait disturbances, DM2 (A1c 5.2), CKD (stage III SCr 23, GFR 33).    Blood Pressure Goal:  130/80  Current Medications:  Amlodipine 5 mg qd  Furosemide 10 mg prn edema  Nadolol 10 mg qd  (HR???)  Family Hx:  Social Hx:  Diet:  Exercise:  Home BP readings:  Intolerances:    Labs:  Wt Readings from Last 3 Encounters:  11/17/18 113 lb (51.3 kg)  11/08/18 112 lb 3.2 oz (50.9 kg)  10/06/18 119 lb 4.3 oz (54.1 kg)   BP Readings from Last 3 Encounters:  11/17/18 (!) 147/80  11/15/18 (!) 156/69  11/10/18 (!) 172/64   Pulse Readings from Last 3 Encounters:  11/17/18 (!) 51  11/15/18 (!) 49  11/10/18 (!) 51    Current Outpatient Medications  Medication Sig Dispense Refill  . acetaminophen (TYLENOL) 325 MG tablet Take 2 tablets (650 mg total) by mouth every 4 (four) hours as needed for mild pain (or temp > 37.5 C (99.5 F)).    Marland Kitchen amLODipine (NORVASC) 5 MG tablet Take 1 tablet (5 mg total) by mouth daily. 90 tablet 3  . apixaban (ELIQUIS) 2.5 MG TABS tablet Take 1 tablet (2.5 mg total) by mouth 2 (two) times daily. 180 tablet 1  . atorvastatin (LIPITOR) 20 MG tablet Take 0.5 tablets (10 mg total) by mouth daily. 30 tablet 0  . levothyroxine (SYNTHROID, LEVOTHROID) 25 MCG tablet Take 1 tablet (25 mcg total) by mouth daily before breakfast. 30 tablet 0  . nadolol (CORGARD) 20 MG tablet Take 0.5 tablets (10 mg total) by mouth daily. 30 tablet 1  . nitroGLYCERIN (NITROSTAT) 0.4 MG SL tablet Place  0.4 mg under the tongue every 5 (five) minutes as needed (chest pain).      No current facility-administered medications for this visit.     Allergies  Allergen Reactions  . Cozaar Other (See Comments)    "Almost passed out"  . Hydralazine Other (See Comments)    "almost passed out"  . Hyzaar [Losartan Potassium-Hctz] Other (See Comments)    "almost passed out"  . Sulfa Drugs Cross Reactors Other (See Comments)    "turned me blue"   . Lisinopril     Unknown reaction, per pt    Past Medical History:  Diagnosis Date  . Atrial fibrillation (HCC)   . Chronic anticoagulation   . Chronic anticoagulation   . CVA (cerebrovascular accident) (HCC)   . Diabetes mellitus    pt & husband deny any knowledge of this   . History of chicken pox   . Hyperlipidemia   . Hypertension     There were no vitals taken for this visit.  No problem-specific Assessment & Plan notes found for this encounter.   Phillips Hay PharmD CPP [facility] [address] [place], [place] [address] [phone]

## 2018-12-19 ENCOUNTER — Ambulatory Visit: Payer: Medicare Other | Admitting: Occupational Therapy

## 2018-12-19 ENCOUNTER — Ambulatory Visit: Payer: Medicare Other | Attending: Physical Medicine & Rehabilitation | Admitting: Speech Pathology

## 2018-12-19 ENCOUNTER — Encounter: Payer: Self-pay | Admitting: Physical Therapy

## 2018-12-19 ENCOUNTER — Ambulatory Visit: Payer: Medicare Other | Admitting: Physical Therapy

## 2018-12-19 DIAGNOSIS — R2681 Unsteadiness on feet: Secondary | ICD-10-CM | POA: Insufficient documentation

## 2018-12-19 DIAGNOSIS — R4701 Aphasia: Secondary | ICD-10-CM | POA: Diagnosis not present

## 2018-12-19 DIAGNOSIS — M79605 Pain in left leg: Secondary | ICD-10-CM | POA: Diagnosis not present

## 2018-12-19 DIAGNOSIS — I69351 Hemiplegia and hemiparesis following cerebral infarction affecting right dominant side: Secondary | ICD-10-CM

## 2018-12-19 DIAGNOSIS — Z9181 History of falling: Secondary | ICD-10-CM | POA: Diagnosis not present

## 2018-12-19 DIAGNOSIS — M6281 Muscle weakness (generalized): Secondary | ICD-10-CM | POA: Diagnosis not present

## 2018-12-19 DIAGNOSIS — R2689 Other abnormalities of gait and mobility: Secondary | ICD-10-CM

## 2018-12-19 DIAGNOSIS — R482 Apraxia: Secondary | ICD-10-CM | POA: Diagnosis not present

## 2018-12-19 DIAGNOSIS — R41841 Cognitive communication deficit: Secondary | ICD-10-CM | POA: Diagnosis not present

## 2018-12-19 DIAGNOSIS — R293 Abnormal posture: Secondary | ICD-10-CM | POA: Diagnosis not present

## 2018-12-19 NOTE — Therapy (Signed)
[facility] [address] [place], [place], [address] Phone: [phone]   Fax:  [phone]  Physical Therapy Treatment  Patient Details  Name: [name] MRN: [id number] Date of Birth: 1929-12-14 Referring Provider (PT): Alysia Penna, MD   Encounter Date: 12/19/2018  PT End of Session - 12/19/18 1411    Visit Number  7    Number of Visits  25    Date for PT Re-Evaluation  01/24/19    Authorization Type  Medicare & AARP  100% covered;  follow Medicare guidelines    PT Start Time  1405    PT Stop Time  1446    PT Time Calculation (min)  41 min    Equipment Utilized During Treatment  Gait belt    Activity Tolerance  Patient tolerated treatment well;Patient limited by fatigue;Patient limited by pain   limited by knee pain today   Behavior During Therapy  Flat affect;WFL for tasks assessed/performed       Past Medical History:  Diagnosis Date  . Atrial fibrillation ([place])   . Chronic anticoagulation   . Chronic anticoagulation   . CVA (cerebrovascular accident) ([place])   . Diabetes mellitus    pt & husband deny any knowledge of this   . History of chicken pox   . Hyperlipidemia   . Hypertension     Past Surgical History:  Procedure Laterality Date  . APPENDECTOMY    . BACK SURGERY    . CARDIAC CATHETERIZATION  11/09/91   EF 70%  . DILATION AND CURETTAGE OF UTERUS    . IR PERCUTANEOUS ART THROMBECTOMY/INFUSION INTRACRANIAL INC DIAG ANGIO  10/03/2018  . RADIOLOGY WITH ANESTHESIA N/A 10/03/2018   Procedure: IR WITH ANESTHESIA;  Surgeon: Radiologist, Medication, MD;  Location: [facility];  Service: Radiology;  Laterality: N/A;    There were no vitals filed for this visit.  Subjective Assessment - 12/19/18 1408    Subjective  Having increased left hip/LE pain last few weeks (since her last fall). Has seen MD (last week) and x-rays were negative for fractures. No new falls.     Patient is accompained  by:  Family member    Pertinent History  HTN, DM, CVA 69yr ago, A-Fib, chronic LBP    Limitations  Lifting;Standing;Walking;House hold activities    Patient Stated Goals  To talk better, to walk & balance better.    Currently in Pain?  Yes    Pain Score  8     Pain Location  Leg    Pain Orientation  Left    Pain Descriptors / Indicators  Aching    Pain Type  Acute pain    Pain Onset  1 to 4 weeks ago    Pain Frequency  Constant    Aggravating Factors   movement, weight through LE    Pain Relieving Factors  rest         [facility] Adult PT Treatment/Exercise - 12/19/18 1416      Bed Mobility   Bed Mobility  Sit to Sidelying Left;Left Sidelying to Sit;Rolling Right;Rolling Left    Rolling Right  Contact Guard/Touching assist    Rolling Left  Contact Guard/Touching assist    Left Sidelying to Sit  Minimal Assistance - Patient >75%    Sit to Sidelying Left  Minimal Assistance - Patient > 75%    Sit to Sidelying Left Details (indicate cue type and reason)  cues/assist to do a log roll to protect back/decr  lumbar strain with lying down/sittng back up      Transfers   Transfers  Sit to Stand;Stand to [employer] Transfers    Sit to Stand  4: Min guard;With upper extremity assist;From chair/3-in-1    Stand to Sit  5: Supervision;With upper extremity assist;To chair/3-in-1      Ambulation/Gait   Ambulation/Gait  Yes    Ambulation/Gait Assistance  4: Min guard    Ambulation/Gait Assistance Details  cues for posture, walker position and step length. spouse brought chair behind pt for safety. limited distance due to LE/back pain, however pt did report less pain with use of RW vs just holding onto spouse as she has been at home. Both encouraged to use RWas much as possible.     Ambulation Distance (Feet)  25 Feet    Assistive device  Rolling walker    Gait Pattern  Step-through pattern;Decreased stride length;Trunk flexed;Narrow base of support    Ambulation Surface  Level;Indoor       Self-Care   Self-Care  Other Self-Care Comments    Other Self-Care Comments   dicussion with primary PT present on pain management for pt. Spouse with questions on Epidural injecton that pain management MD wants to do. Discussed that they should consider all the pros and cons and have a frank discussion with the MD before deciding one way or another. Discussed use of RW for gait to offload back and LE's. Also discussed that PT will begin to address core strengthening/stretching for attempt to reduce pain. Spouse asked about ultrasound for pain management per his daughter's recommendation. DIscussed the risk of use of modalities such as this due to pt with decreased sensation and decreased ability to report any issues. Spouse and pt verbalized understanding.                               Exercises   Exercises  Other Exercises    Other Exercises   hooklying on mat table: abd brace with posterior pelvic tilt 5 sec hold x10 reps, single knee to chest for 30 sec's x3 each side, with abd bracing alternating marcing for 10 reps each side. added these to pt's Medbridge program.           PT Short Term Goals - 11/22/18 1452      PT SHORT TERM GOAL #1   Title  Patient's husband verbalizes fall risk prevention strategies. (All STGs Target Date: 11/25/2018)    Baseline  11/22/18: met per spouse report.     Status  Achieved      PT SHORT TERM GOAL #2   Title  patient ambulates 100' around furniture with RW with supervision.     Baseline  11/22/18: met today    Time  --    Period  --    Status  Achieved      PT SHORT TERM GOAL #3   Title  Patient sit to/from stand from chairs with armrests without touching external support to stabilize with supervision.     Baseline  11/22/18: met today    Time  --    Period  --    Status  Achieved      PT SHORT TERM GOAL #4   Title  Patient & husband demonstrate understanding of initial HEP.     Baseline  11/22/18: met with current HEP    Status  Achieved  PT Long Term Goals - 10/27/18 1008      PT LONG TERM GOAL #1   Title  Patient & husband verbalize understanding of fall prevention strategies including appropriate assistive device. (All LTGs Target Date: 01/20/2019)    Time  12    Period  Weeks    Status  New    Target Date  01/20/19      PT LONG TERM GOAL #2   Title  Berg Balance >36/56 to indicate lower fall risk.     Time  12    Period  Weeks    Status  New    Target Date  01/20/19      PT LONG TERM GOAL #3   Title  Timed Up & Go with LRAD modified independent <13.5 seconds    Time  12    Period  Weeks    Status  New    Target Date  01/20/19      PT LONG TERM GOAL #4   Title  Patient ambulates 100' around furniture with LRAD with no balance losses with supervision for cognitive deficits only to enable safe mobility within her home without assistance.     Time  12    Period  Weeks    Status  New    Target Date  01/20/19      PT LONG TERM GOAL #5   Title  Patient ambulates 400' outdoors on paved surfaces with LRAD with supervision for community mobility.     Time  12    Period  Weeks    Status  New    Target Date  01/20/19      Additional Long Term Goals   Additional Long Term Goals  Yes      PT LONG TERM GOAL #6   Title  Patient negotiates ramps, curbs & 2 steps similar to home entrance with LRAD with supervision for safe community access.    Time  12    Period  Weeks    Status  New    Target Date  01/20/19      PT LONG TERM GOAL #7   Title  Patient and husband demonstrate & verbalize understanding of ongoing HEP & fitness plan.     Time  12    Period  Weeks    Status  New    Target Date  01/20/19            Plan - 12/19/18 1412    Clinical Impression Statement  The pt returns today after missing several weeks due to increased pain following a fall. She has seen a doctor and had fractures ruled out. Today's skilled session focused on revision of her HEP to address back/LE pain. Also use the RW  with gait as she has not been doing so at home. She did report decreased pain with use of RW.     Rehab Potential  Good    PT Frequency  2x / week    PT Duration  12 weeks    PT Treatment/Interventions  ADLs/Self Care Home Management;DME Instruction;Gait training;Stair training;Functional mobility training;Therapeutic activities;Therapeutic exercise;Balance training;Neuromuscular re-education;Patient/family education;Vestibular    PT Next Visit Plan  continue to work on core strengthening/stability, standing tolerance with UE support, gait with RW as pain allows    PT [place] and Agree with Plan of Care  Patient;Family member/caregiver    Family Member Consulted  husband, Nelson Chimes  Patient will benefit from skilled therapeutic intervention in order to improve the following deficits and impairments:  Abnormal gait, Decreased activity tolerance, Decreased balance, Decreased cognition, Decreased coordination, Decreased endurance, Decreased knowledge of use of DME, Decreased mobility, Decreased strength, Dizziness, Postural dysfunction, Pain  Visit Diagnosis: Muscle weakness (generalized)  History of falling  Other abnormalities of gait and mobility  Hemiplegia and hemiparesis following cerebral infarction affecting right dominant side (HCC)  Abnormal posture     Problem List Patient Active Problem List   Diagnosis Date Noted  . Expressive aphasia 11/17/2018  . Gait disturbance, post-stroke 11/17/2018  . Hemiparesis affecting dominant side as late effect of cerebrovascular accident ([place]) 11/17/2018  . Aphasia, post-stroke 11/17/2018  . Aphasia as late effect of stroke 11/08/2018  . 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 Left middle cerebral artery stroke ([place]) 10/06/2018  . Dyslipidemia   . Diabetes mellitus type 2 in nonobese (HCC)   . Atrial fibrillation ([place])   . Tachypnea   . Stage 3 chronic kidney disease ([place])   . Stroke [facility])  ischemic embolic L MCA  d/t AF 18/56/3149  . 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 Frequent falls 10/13/2017  . Vitamin D deficiency 04/22/2017  . Slurring of speech 03/31/2017  . Back pain 09/14/2016  . Compression fracture of thoracic vertebra (HCC) 09/14/2016  . S/P vertebroplasty 09/14/2016  . Chronic diastolic heart failure ([place]) 09/14/2016  . HTN (hypertension) 09/14/2016  . History of CVA (cerebrovascular accident) 09/14/2016  . Hypokalemia 09/11/2016  . Paraspinal hematoma 09/11/2016  . Osteoporosis 05/27/2016  . Hypothyroidism 09/11/2014  . Persistent atrial fibrillation ([place]) 12/15/2012  . HLD (hyperlipidemia) 03/27/2011   Willow Ora, PTA, [facility] [address], [place], [place] [address] [phone] 12/19/18, 10:03 PM   Name: Abbagale Goguen MRN: [id number] Date of Birth: 05/08/1930

## 2018-12-19 NOTE — Patient Instructions (Signed)
Access Code: [email or web address]  URL: [email or web address]  Date: 12/19/2018  Prepared by: Sallyanne Kuster   Exercises  Supine Posterior Pelvic Tilt - 10 reps - 1 sets - 5 hold - 1x daily - 5x weekly  Hooklying Single Knee to Chest Stretch - 3 reps - 1 sets - 30 hold - 1x daily - 7x weekly  Supine March with Posterior Pelvic Tilt - 10 reps - 1 sets - 1x daily - 5x weekly

## 2018-12-20 DIAGNOSIS — Z7901 Long term (current) use of anticoagulants: Secondary | ICD-10-CM | POA: Diagnosis not present

## 2018-12-20 DIAGNOSIS — I5032 Chronic diastolic (congestive) heart failure: Secondary | ICD-10-CM | POA: Diagnosis not present

## 2018-12-20 DIAGNOSIS — I6789 Other cerebrovascular disease: Secondary | ICD-10-CM | POA: Diagnosis not present

## 2018-12-20 DIAGNOSIS — I10 Essential (primary) hypertension: Secondary | ICD-10-CM | POA: Diagnosis not present

## 2018-12-20 DIAGNOSIS — I480 Paroxysmal atrial fibrillation: Secondary | ICD-10-CM | POA: Diagnosis not present

## 2018-12-20 DIAGNOSIS — Z1339 Encounter for screening examination for other mental health and behavioral disorders: Secondary | ICD-10-CM | POA: Diagnosis not present

## 2018-12-20 DIAGNOSIS — E038 Other specified hypothyroidism: Secondary | ICD-10-CM | POA: Diagnosis not present

## 2018-12-20 DIAGNOSIS — E559 Vitamin D deficiency, unspecified: Secondary | ICD-10-CM | POA: Diagnosis not present

## 2018-12-20 DIAGNOSIS — M810 Age-related osteoporosis without current pathological fracture: Secondary | ICD-10-CM | POA: Diagnosis not present

## 2018-12-20 DIAGNOSIS — I69998 Other sequelae following unspecified cerebrovascular disease: Secondary | ICD-10-CM | POA: Diagnosis not present

## 2018-12-20 DIAGNOSIS — M5489 Other dorsalgia: Secondary | ICD-10-CM | POA: Diagnosis not present

## 2018-12-20 DIAGNOSIS — E7800 Pure hypercholesterolemia, unspecified: Secondary | ICD-10-CM | POA: Diagnosis not present

## 2018-12-21 NOTE — Therapy (Signed)
[facility] [address] [place], [place], [address] Phone: [phone]   Fax:  [phone]  Speech Language Pathology Treatment  Patient Details  Name: [name] MRN: [id number] Date of Birth: 03-03-1930 Referring Provider (SLP): Dr. Letta Pate   Encounter Date: 12/19/2018  End of Session - 12/21/18 0841    Visit Number  13    Number of Visits  25    Date for SLP Re-Evaluation  01/25/19    Authorization Type  MCR    Authorization Time Period  90 days    SLP Start Time  1317    SLP Stop Time   1405    SLP Time Calculation (min)  48 min    Activity Tolerance  Patient tolerated treatment well       Past Medical History:  Diagnosis Date  . Atrial fibrillation ([place])   . Chronic anticoagulation   . Chronic anticoagulation   . CVA (cerebrovascular accident) ([place])   . Diabetes mellitus    pt & husband deny any knowledge of this   . History of chicken pox   . Hyperlipidemia   . Hypertension     Past Surgical History:  Procedure Laterality Date  . APPENDECTOMY    . BACK SURGERY    . CARDIAC CATHETERIZATION  11/09/91   EF 70%  . DILATION AND CURETTAGE OF UTERUS    . IR PERCUTANEOUS ART THROMBECTOMY/INFUSION INTRACRANIAL INC DIAG ANGIO  10/03/2018  . RADIOLOGY WITH ANESTHESIA N/A 10/03/2018   Procedure: IR WITH ANESTHESIA;  Surgeon: Radiologist, Medication, MD;  Location: [facility];  Service: Radiology;  Laterality: N/A;    There were no vitals filed for this visit.         ADULT SLP TREATMENT - 12/21/18 0827      General Information   Behavior/Cognition  Alert;Cooperative;Pleasant mood;Requires cueing      Treatment Provided   Treatment provided  Cognitive-Linquistic      Cognitive-Linquistic Treatment   Treatment focused on  Aphasia;Apraxia;Patient/family/caregiver education    Skilled Treatment  When using pain score on SGD (SLP navigated to section), pt verbalized "between 7 and 8"  when pointing to both numbers (without activating). Husband proposed, "putting a list of words on here," (SGD) "for her to say." SLP educated re: purpose of SGD/multimodal means to supplement pt's verbal communication. Husband continues to ask SLP about and probing pt to "Say the words." SLP continues to educate that demanding accurate production likely to be frustrating for pt and that even if pt is able to say some words correctly, this is not a reliable means of functional communication for her. Husband expressed great frustration when pt repeats things over and over that he can't understand, "She doesn't know she's saying it wrong." Husband provided example of pt requesting her jacket this morning, by pulling on pt's jacket and asking, "What's this, honey? Can you say what this is?" Pt responded, "A sprockel." SLP wrote "sprockel" on whiteboard and pt with facial expression indicating awareness that this was incorrect. SLP then demonstrated using SGD, by accessing "Home" and began questioning pt re: locating of where it is kept (Y/N questions). When SLP got to closet, pt affirmed "Yes" and accessed the section with verbal/demo cue to touch icon. Husband reports when pt wants something, he tries to guess repeatedly and both he and pt get frustrated. He appears eager to use lingraphica device with pt, and continues to program, although he has difficulty locating items he has  added. SLP simplified device in hopes of making it more functional, removing icons and minimizing branching "folders".       Progression Toward Goals   Progression toward goals  Not progressing toward goals (comment)   pt awareness; simplified AAC device today for functional use      SLP Education - 12/21/18 0840    Education Details  purpose of SGD. repeating words not likely to be functional/helpful for pt    Person(s) Educated  Patient;Spouse    Methods  Explanation;Demonstration;Verbal cues    Comprehension  Need further  instruction       SLP Short Term Goals - 12/21/18 0920      SLP SHORT TERM GOAL #1   Title  Pt will demo awareness of 50% of errors in a functional task with usual mod A (verbal/written cues).     Time  1    Period  Weeks    Status  Partially Met      SLP SHORT TERM GOAL #2   Title  Pt will communicate preferences or needs (food, activities, medical etc) x 3 sessions with usual mod A (AAC if necessary).    Baseline  11-25-18 (vegetables), 12-12-18 (fruits, vegetables, tv dinners)    Time  1    Period  Weeks    Status  Partially Met      SLP SHORT TERM GOAL #3   Title  Pt will communicate personal details (medical history, family, name, age, etc) with usual mod A x 3 sessions (AAC if necessary)    Baseline  pain level 12-12-18, 12/19/18    Time  1    Period  Weeks    Status  Partially Met       SLP Long Term Goals - 12/21/18 0920      SLP LONG TERM GOAL #1   Title  Pt will attempt error correction in 3/5 opportunities when cued appropriately by communication partner x 3 sessions.    Time  6    Period  Weeks    Status  On-going      SLP LONG TERM GOAL #2   Title  Family will demo understanding of at least 4 appropriate ways to enhance pt's comprehension/expression during 4 sessions.     Time  6    Period  Weeks    Status  On-going      SLP LONG TERM GOAL #3   Title  Pt will reduce social isolation risks by communicating social introductions, common social messages, and personal interests using multimodal communication with occasional min A from communication partner over 4 sessions.    Time  6    Period  Weeks    Status  On-going      SLP LONG TERM GOAL #4   Title  Pt/family will report improvement in understanding of pt's needs and requests than prior to ST.    Time  6    Period  Weeks    Status  On-going       Plan - 12/21/18 0841    Clinical Impression Statement  Patient presents with likely severe expressive aphasia, with expressive > receptive deficits. Pt cont  today, rarely, with functional spontaneous 1-2 word utterances. At times appears to understand she is not talking in a manner SLP/husband could understand, though frequently unaware of errors. Written cues/ repetition was helpful in improving awareness. Husband continues to learn how to assist pt with SGD at home; pt less resistant to using device during today's   session. Hopefully with more work on raising pt's awareness of errors she will be more apt to go to [place] at home, to communicate. I recommend cont'd skilled ST to address incr'd pt awareness of verbal errors, to maximize communication and to train communication partner for pt use of AAC for wants/needs, safety, independence and QOL.     Speech Therapy Frequency  2x / week    Duration  --   12 weeks or 25 visits   Treatment/Interventions  Cognitive reorganization;Multimodal communcation approach;Compensatory strategies;Language facilitation;Compensatory techniques;Cueing hierarchy;Internal/external aids;Functional tasks;SLP instruction and feedback;Patient/family education    Potential to Achieve Goals  Good    Potential Considerations  Cooperation/participation level;Severity of impairments;Ability to learn/carryover information       Patient will benefit from skilled therapeutic intervention in order to improve the following deficits and impairments:   Aphasia    Problem List Patient Active Problem List   Diagnosis Date Noted  . Expressive aphasia 11/17/2018  . Gait disturbance, post-stroke 11/17/2018  . Hemiparesis affecting dominant side as late effect of cerebrovascular accident ([place]) 11/17/2018  . Aphasia, post-stroke 11/17/2018  . Aphasia as late effect of stroke 11/08/2018  . 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 Left middle cerebral artery stroke ([place]) 10/06/2018  . Dyslipidemia   . Diabetes mellitus type 2 in nonobese ([facility])   . Atrial fibrillation ([place])   . Tachypnea   . Stage 3 chronic kidney disease ([place])   .  Stroke [facility]) ischemic embolic L MCA  d/t AF 28/41/3244  . 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 Frequent falls 10/13/2017  . Vitamin D deficiency 04/22/2017  . Slurring of speech 03/31/2017  . Back pain 09/14/2016  . Compression fracture of thoracic vertebra ([facility]) 09/14/2016  . S/P vertebroplasty 09/14/2016  . Chronic diastolic heart failure ([place]) 09/14/2016  . HTN (hypertension) 09/14/2016  . History of CVA (cerebrovascular accident) 09/14/2016  . Hypokalemia 09/11/2016  . Paraspinal hematoma 09/11/2016  . Osteoporosis 05/27/2016  . Hypothyroidism 09/11/2014  . Persistent atrial fibrillation ([place]) 12/15/2012  . HLD (hyperlipidemia) 03/27/2011    Deneise Lever, [place], [place] 12/21/2018, 9:21 AM  [facility] [address] [place], [place], [address] Phone: [phone]   Fax:  [phone]   Name: [name] MRN: [id number] Date of Birth: [birth year]

## 2018-12-22 ENCOUNTER — Encounter: Payer: Medicare Other | Admitting: Occupational Therapy

## 2018-12-22 ENCOUNTER — Ambulatory Visit: Payer: Medicare Other | Admitting: Physical Therapy

## 2018-12-22 ENCOUNTER — Ambulatory Visit: Payer: Medicare Other | Admitting: Speech Pathology

## 2018-12-22 DIAGNOSIS — M79605 Pain in left leg: Secondary | ICD-10-CM | POA: Diagnosis not present

## 2018-12-22 DIAGNOSIS — M6281 Muscle weakness (generalized): Secondary | ICD-10-CM | POA: Diagnosis not present

## 2018-12-22 DIAGNOSIS — R2681 Unsteadiness on feet: Secondary | ICD-10-CM | POA: Diagnosis not present

## 2018-12-22 DIAGNOSIS — R4701 Aphasia: Secondary | ICD-10-CM | POA: Diagnosis not present

## 2018-12-22 DIAGNOSIS — I69351 Hemiplegia and hemiparesis following cerebral infarction affecting right dominant side: Secondary | ICD-10-CM | POA: Diagnosis not present

## 2018-12-22 DIAGNOSIS — R2689 Other abnormalities of gait and mobility: Secondary | ICD-10-CM | POA: Diagnosis not present

## 2018-12-23 ENCOUNTER — Encounter: Payer: Self-pay | Admitting: Occupational Therapy

## 2018-12-23 NOTE — Therapy (Unsigned)
[facility] [address] [place], [place], [address] Phone: [phone]   Fax:  [phone]  Patient Details  Name: [name] MRN: [id number] Date of Birth: Mar 07, 1930 Referring Provider:  No ref. provider found  Encounter Date: 12/22/2018  Spoke at length with patient's husband.  He is concerned with her recent back pain.  He feels at this time it makes most sense to focus energy on speech and physical therapy at this time.  Patient has had limited ability to participate in all three disciplines due to pain.   Will discontinue OT services at this time.  Patient has not met OT goals due to significantly limited ability to participate, and limited ability to carryover functional mobility to home setting.    Mariah Milling, OTR/L 12/23/2018, 2:39 PM  [facility] [address] [place], [place], [address] Phone: [phone]   Fax:  [phone]

## 2018-12-25 NOTE — Therapy (Signed)
[facility] [address] [place], [place], [address] Phone: [phone]   Fax:  [phone]  Speech Language Pathology Treatment  Patient Details  Name: [name] MRN: [id number] Date of Birth: [birth year] Referring Provider (SLP): Dr. Letta Pate   Encounter Date: 12/22/2018  End of Session - 12/25/18 1644    Visit Number  14    Number of Visits  25    Date for SLP Re-Evaluation  01/25/19    Authorization Type  MCR    Authorization Time Period  90 days    SLP Start Time  1317    SLP Stop Time   1400    SLP Time Calculation (min)  43 min    Activity Tolerance  Patient tolerated treatment well       Past Medical History:  Diagnosis Date  . Atrial fibrillation ([place])   . Chronic anticoagulation   . Chronic anticoagulation   . CVA (cerebrovascular accident) ([place])   . Diabetes mellitus    pt & husband deny any knowledge of this   . History of chicken pox   . Hyperlipidemia   . Hypertension     Past Surgical History:  Procedure Laterality Date  . APPENDECTOMY    . BACK SURGERY    . CARDIAC CATHETERIZATION  11/09/91   EF 70%  . DILATION AND CURETTAGE OF UTERUS    . IR PERCUTANEOUS ART THROMBECTOMY/INFUSION INTRACRANIAL INC DIAG ANGIO  10/03/2018  . RADIOLOGY WITH ANESTHESIA N/A 10/03/2018   Procedure: IR WITH ANESTHESIA;  Surgeon: Radiologist, Medication, MD;  Location: [facility];  Service: Radiology;  Laterality: N/A;    There were no vitals filed for this visit.  Subjective Assessment - 12/25/18 1626    Subjective  "I been hurting in my ear." (pt means hip)    Patient is accompained by:  Family member    Currently in Pain?  Yes    Pain Score  8     Pain Location  Hip    Pain Orientation  Left            ADULT SLP TREATMENT - 12/25/18 1627      General Information   Behavior/Cognition  Alert;Cooperative;Pleasant mood;Requires cueing      Treatment Provided   Treatment provided   Cognitive-Linquistic      Cognitive-Linquistic Treatment   Treatment focused on  Aphasia;Apraxia;Patient/family/caregiver education    Skilled Treatment  SLP worked with verbal expression today, demonstrating use of multimodal cuing and speech generating device to assist in communication breakdowns. Pt unaware of paraphasia when discussing pain (see "S"), SLP wrote pt's statement and pt corrected, "No, in my hip," while gesturing. Likewise, this cue was effective when responding to simple question with comment about her "husband" when she meant "daughter." Pt's responses to simple, familiar/overlearned topics were generally fluent (phrases and short sentences). SLP facilitated simple conversation re: how pt met her husband. Pt required frequent min-mod A to select icons on Touch[employer] to provide additional details. Husband reports attempting to contact Engineer, civil (consulting) but pt's husband has been unable to commit to a time for a phone conference.      Assessment / Recommendations / Plan   Plan  Continue with current plan of care      Progression Toward Goals   Progression toward goals  Progressing toward goals         SLP Short Term Goals - 12/25/18 1628      SLP SHORT TERM GOAL #1  Title  Pt will demo awareness of 50% of errors in a functional task with usual mod A (verbal/written cues).     Time  1    Period  Weeks    Status  Partially Met      SLP SHORT TERM GOAL #2   Title  Pt will communicate preferences or needs (food, activities, medical etc) x 3 sessions with usual mod A (AAC if necessary).    Baseline  11-25-18 (vegetables), 12-12-18 (fruits, vegetables, tv dinners)    Time  1    Period  Weeks    Status  Partially Met      SLP SHORT TERM GOAL #3   Title  Pt will communicate personal details (medical history, family, name, age, etc) with usual mod A x 3 sessions (AAC if necessary)    Baseline  pain level 12-12-18, 12/19/18    Time  1    Period  Weeks    Status  Partially Met        SLP Long Term Goals - 12/25/18 1629      SLP LONG TERM GOAL #1   Title  Pt will attempt error correction in 3/5 opportunities when cued appropriately by communication partner x 3 sessions.    Time  6    Period  Weeks    Status  On-going      SLP LONG TERM GOAL #2   Title  Family will demo understanding of at least 4 appropriate ways to enhance pt's comprehension/expression during 4 sessions.     Time  6    Period  Weeks    Status  On-going      SLP LONG TERM GOAL #3   Title  Pt will reduce social isolation risks by communicating social introductions, common social messages, and personal interests using multimodal communication with occasional min A from communication partner over 4 sessions.    Time  6    Period  Weeks    Status  On-going      SLP LONG TERM GOAL #4   Title  Pt/family will report improvement in understanding of pt's needs and requests than prior to ST.    Time  6    Period  Weeks    Status  On-going       Plan - 12/25/18 1644    Clinical Impression Statement  Patient presents with likely severe expressive aphasia, with expressive > receptive deficits. Pt today with expanded functional spontaneous phrase and short sentence responses (overlearned topics). At times appears to understand she is not talking in a manner SLP/husband could understand, though frequently unaware of errors. Written cues/ repetition was helpful in improving awareness. Husband continues to learn how to assist pt with SGD at home; pt less resistant to using device during today's session. Hopefully with more work on raising pt's awareness of errors she will be more apt to go to [place] at home, to communicate. I recommend cont'd skilled ST to address incr'd pt awareness of verbal errors, to maximize communication and to train communication partner for pt use of AAC for wants/needs, safety, independence and QOL.     Speech Therapy Frequency  2x / week    Duration  --   12 weeks or 25  visits   Treatment/Interventions  Cognitive reorganization;Multimodal communcation approach;Compensatory strategies;Language facilitation;Compensatory techniques;Cueing hierarchy;Internal/external aids;Functional tasks;SLP instruction and feedback;Patient/family education    Potential to Achieve Goals  Good    Potential Considerations  Cooperation/participation level;Severity of impairments;Ability to learn/carryover information  Patient will benefit from skilled therapeutic intervention in order to improve the following deficits and impairments:   Aphasia    Problem List Patient Active Problem List   Diagnosis Date Noted  . Expressive aphasia 11/17/2018  . Gait disturbance, post-stroke 11/17/2018  . Hemiparesis affecting dominant side as late effect of cerebrovascular accident ([place]) 11/17/2018  . Aphasia, post-stroke 11/17/2018  . Aphasia as late effect of stroke 11/08/2018  . 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 Left middle cerebral artery stroke ([place]) 10/06/2018  . Dyslipidemia   . Diabetes mellitus type 2 in nonobese ([facility])   . Atrial fibrillation ([place])   . Tachypnea   . Stage 3 chronic kidney disease ([place])   . Stroke [facility]) ischemic embolic L MCA  d/t AF 86/16/8372  . 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 Frequent falls 10/13/2017  . Vitamin D deficiency 04/22/2017  . Slurring of speech 03/31/2017  . Back pain 09/14/2016  . Compression fracture of thoracic vertebra ([facility]) 09/14/2016  . S/P vertebroplasty 09/14/2016  . Chronic diastolic heart failure ([place]) 09/14/2016  . HTN (hypertension) 09/14/2016  . History of CVA (cerebrovascular accident) 09/14/2016  . Hypokalemia 09/11/2016  . Paraspinal hematoma 09/11/2016  . Osteoporosis 05/27/2016  . Hypothyroidism 09/11/2014  . Persistent atrial fibrillation ([place]) 12/15/2012  . HLD (hyperlipidemia) 03/27/2011   Deneise Lever, [place], [place] 12/25/2018, 4:46  PM  [facility] [address] [place], [place], [address] Phone: [phone]   Fax:  [phone]   Name: Kenneshia Rehm MRN: [id number] Date of Birth: [birth year]

## 2018-12-27 ENCOUNTER — Ambulatory Visit: Payer: Medicare Other | Admitting: Speech Pathology

## 2018-12-27 ENCOUNTER — Ambulatory Visit: Payer: Medicare Other | Admitting: Occupational Therapy

## 2018-12-27 ENCOUNTER — Encounter: Payer: Self-pay | Admitting: Physical Therapy

## 2018-12-27 ENCOUNTER — Ambulatory Visit: Payer: Medicare Other | Admitting: Physical Therapy

## 2018-12-27 DIAGNOSIS — R4701 Aphasia: Secondary | ICD-10-CM | POA: Diagnosis not present

## 2018-12-27 DIAGNOSIS — R2681 Unsteadiness on feet: Secondary | ICD-10-CM | POA: Diagnosis not present

## 2018-12-27 DIAGNOSIS — I69351 Hemiplegia and hemiparesis following cerebral infarction affecting right dominant side: Secondary | ICD-10-CM | POA: Diagnosis not present

## 2018-12-27 DIAGNOSIS — M6281 Muscle weakness (generalized): Secondary | ICD-10-CM | POA: Diagnosis not present

## 2018-12-27 DIAGNOSIS — R2689 Other abnormalities of gait and mobility: Secondary | ICD-10-CM

## 2018-12-27 DIAGNOSIS — M79605 Pain in left leg: Secondary | ICD-10-CM | POA: Diagnosis not present

## 2018-12-27 DIAGNOSIS — R293 Abnormal posture: Secondary | ICD-10-CM

## 2018-12-27 NOTE — Therapy (Signed)
[facility] [address] [place], [place], [address] Phone: [phone]   Fax:  [phone]  Speech Language Pathology Treatment  Patient Details  Name: [name] MRN: [id number] Date of Birth: August 30, 1930 Referring Provider (SLP): Dr. Letta Pate   Encounter Date: 12/27/2018  End of Session - 12/27/18 1417    Visit Number  15    Number of Visits  25    Date for SLP Re-Evaluation  01/25/19    Authorization Type  MCR    Authorization Time Period  90 days    SLP Start Time  1317    SLP Stop Time   1400    SLP Time Calculation (min)  43 min    Activity Tolerance  Patient tolerated treatment well       Past Medical History:  Diagnosis Date  . Atrial fibrillation ([place])   . Chronic anticoagulation   . Chronic anticoagulation   . CVA (cerebrovascular accident) ([place])   . Diabetes mellitus    pt & husband deny any knowledge of this   . History of chicken pox   . Hyperlipidemia   . Hypertension     Past Surgical History:  Procedure Laterality Date  . APPENDECTOMY    . BACK SURGERY    . CARDIAC CATHETERIZATION  11/09/91   EF 70%  . DILATION AND CURETTAGE OF UTERUS    . IR PERCUTANEOUS ART THROMBECTOMY/INFUSION INTRACRANIAL INC DIAG ANGIO  10/03/2018  . RADIOLOGY WITH ANESTHESIA N/A 10/03/2018   Procedure: IR WITH ANESTHESIA;  Surgeon: Radiologist, Medication, MD;  Location: [facility];  Service: Radiology;  Laterality: N/A;    There were no vitals filed for this visit.  Subjective Assessment - 12/27/18 1325    Subjective  "I leave that up to him"             ADULT SLP TREATMENT - 12/27/18 1417      General Information   Behavior/Cognition  Alert;Cooperative;Pleasant mood;Requires cueing      Treatment Provided   Treatment provided  Cognitive-Linquistic      Pain Assessment   Pain Assessment  No/denies pain      Cognitive-Linquistic Treatment   Treatment focused on   Aphasia;Apraxia;Patient/family/caregiver education    Skilled Treatment  SLP worked with pt on error awareness and using multimodal means, SGD when breakdowns occur. In confrontation naming, pt named 2/11 items correctly (fruits, animals). Pt aware of errors 66% of the time, either attempting correction or shaking her head. Responses were mostly semantic paraphasias (peanuts/cherries), approximations of semantic paraphasias (peach-en/strawberries), and pt stated "aspirin" on several occasions. SLP demo'd navigating to appropriate sections (Food>Food>Fruit) and Animals (added directly to homepage) to locate items and requested pt do the same if she could not name the word. Also targeted verbal expression/description/gestures during this task; pt required max A for this; attempts at description were not functional. Pt selected appropriate icon in 8/9 opportunities. Usual mod-max A initially for navigation, fading to occasional mod A to locate items in appropriate folders from home screen. Husband has call planned with SGD consultant at 1pm tomorrow.      Assessment / Recommendations / Plan   Plan  Continue with current plan of care      Progression Toward Goals   Progression toward goals  Progressing toward goals         SLP Short Term Goals - 12/27/18 1418      SLP SHORT TERM GOAL #1   Title  Pt  will demo awareness of 50% of errors in a functional task with usual mod A (verbal/written cues).     Time  1    Period  Weeks    Status  Partially Met      SLP SHORT TERM GOAL #2   Title  Pt will communicate preferences or needs (food, activities, medical etc) x 3 sessions with usual mod A (AAC if necessary).    Baseline  11-25-18 (vegetables), 12-12-18 (fruits, vegetables, tv dinners)    Time  1    Period  Weeks    Status  Partially Met      SLP SHORT TERM GOAL #3   Title  Pt will communicate personal details (medical history, family, name, age, etc) with usual mod A x 3 sessions (AAC if necessary)     Baseline  pain level 12-12-18, 12/19/18    Time  1    Period  Weeks    Status  Partially Met       SLP Long Term Goals - 12/27/18 1418      SLP LONG TERM GOAL #1   Title  Pt will attempt error correction in 3/5 opportunities when cued appropriately by communication partner x 3 sessions.    Baseline  word level 12/27/18    Time  5    Period  Weeks    Status  On-going      SLP LONG TERM GOAL #2   Title  Family will demo understanding of at least 4 appropriate ways to enhance pt's comprehension/expression during 4 sessions.     Time  5    Period  Weeks    Status  On-going      SLP LONG TERM GOAL #3   Title  Pt will reduce social isolation risks by communicating social introductions, common social messages, and personal interests using multimodal communication with occasional min A from communication partner over 4 sessions.    Time  5    Period  Weeks    Status  On-going      SLP LONG TERM GOAL #4   Title  Pt/family will report improvement in understanding of pt's needs and requests than prior to ST.    Time  5    Period  Weeks    Status  On-going       Plan - 12/27/18 1418    Clinical Impression Statement  Patient presents with likely severe expressive aphasia, with expressive > receptive deficits. Pt today with expanded functional spontaneous phrase and short sentence responses (overlearned topics). At times appears to understand she is not talking in a manner SLP/husband could understand, though frequently unaware of errors. Written cues/ repetition was helpful in improving awareness. Husband continues to learn how to assist pt with SGD at home; pt less resistant to using device during today's session. Hopefully with more work on raising pt's awareness of errors she will be more apt to go to [facility] at home, to communicate. I recommend cont'd skilled ST to address incr'd pt awareness of verbal errors, to maximize communication and to train communication partner for pt use of  AAC for wants/needs, safety, independence and QOL.     Speech Therapy Frequency  2x / week    Duration  --   12 weeks or 25 visits   Treatment/Interventions  Cognitive reorganization;Multimodal communcation approach;Compensatory strategies;Language facilitation;Compensatory techniques;Cueing hierarchy;Internal/external aids;Functional tasks;SLP instruction and feedback;Patient/family education    Potential to Achieve Goals  Good    Potential Considerations  Cooperation/participation level;Severity  of impairments;Ability to learn/carryover information       Patient will benefit from skilled therapeutic intervention in order to improve the following deficits and impairments:   Aphasia    Problem List Patient Active Problem List   Diagnosis Date Noted  . Expressive aphasia 11/17/2018  . Gait disturbance, post-stroke 11/17/2018  . Hemiparesis affecting dominant side as late effect of cerebrovascular accident ([place]) 11/17/2018  . Aphasia, post-stroke 11/17/2018  . Aphasia as late effect of stroke 11/08/2018  . 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 Left middle cerebral artery stroke ([place]) 10/06/2018  . Dyslipidemia   . Diabetes mellitus type 2 in nonobese (HCC)   . Atrial fibrillation ([place])   . Tachypnea   . Stage 3 chronic kidney disease ([place])   . Stroke [facility]) ischemic embolic L MCA  d/t AF 71/69/6789  . 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 Frequent falls 10/13/2017  . Vitamin D deficiency 04/22/2017  . Slurring of speech 03/31/2017  . Back pain 09/14/2016  . Compression fracture of thoracic vertebra (HCC) 09/14/2016  . S/P vertebroplasty 09/14/2016  . Chronic diastolic heart failure ([place]) 09/14/2016  . HTN (hypertension) 09/14/2016  . History of CVA (cerebrovascular accident) 09/14/2016  . Hypokalemia 09/11/2016  . Paraspinal hematoma 09/11/2016  . Osteoporosis 05/27/2016  . Hypothyroidism 09/11/2014  . Persistent atrial fibrillation ([place]) 12/15/2012  . HLD  (hyperlipidemia) 03/27/2011   Deneise Lever, Levy, Caroline 12/27/2018, 2:21 PM  [facility] [address] [place], [place], [address] Phone: [phone]   Fax:  [phone]   Name: Nikitha Mode MRN: [id number] Date of Birth: 07-12-1930

## 2018-12-29 NOTE — Therapy (Signed)
[facility] [address] [place], [place], [address] Phone: [phone]   Fax:  [phone]  Physical Therapy Treatment  Patient Details  Name: [name] MRN: [id number] Date of Birth: 11-16-1929 Referring Provider (PT): Alysia Penna, MD   Encounter Date: 12/27/2018   12/27/18 1408  PT Visits / Re-Eval  Visit Number 8  Number of Visits 25  Date for PT Re-Evaluation 01/24/19  Authorization  Authorization Type Medicare & AARP  100% covered;  follow Medicare guidelines  PT Time Calculation  PT Start Time 1402  PT Stop Time 1441  PT Time Calculation (min) 39 min  PT - End of Session  Equipment Utilized During Treatment Gait belt  Activity Tolerance Patient tolerated treatment well;Patient limited by fatigue  Behavior During Therapy Flat affect;WFL for tasks assessed/performed      Past Medical History:  Diagnosis Date  . Atrial fibrillation ([place])   . Chronic anticoagulation   . Chronic anticoagulation   . CVA (cerebrovascular accident) ([place])   . Diabetes mellitus    pt & husband deny any knowledge of this   . History of chicken pox   . Hyperlipidemia   . Hypertension     Past Surgical History:  Procedure Laterality Date  . APPENDECTOMY    . BACK SURGERY    . CARDIAC CATHETERIZATION  11/09/91   EF 70%  . DILATION AND CURETTAGE OF UTERUS    . IR PERCUTANEOUS ART THROMBECTOMY/INFUSION INTRACRANIAL INC DIAG ANGIO  10/03/2018  . RADIOLOGY WITH ANESTHESIA N/A 10/03/2018   Procedure: IR WITH ANESTHESIA;  Surgeon: Radiologist, Medication, MD;  Location: [facility];  Service: Radiology;  Laterality: N/A;    There were no vitals filed for this visit.     12/27/18 1406  Symptoms/Limitations  Subjective No significant change in pain, still with back and left LE pain. Have been trying the new exercises at home without any significant issues.   Patient is accompained by: Family member (spouse in  lobby)  Pertinent History HTN, DM, CVA 65yr ago, A-Fib, chronic LBP  Limitations Lifting;Standing;Walking;House hold activities  Patient Stated Goals To talk better, to walk & balance better.  Pain Assessment  Currently in Pain? Yes  Pain Score 7  Pain Location Hip  Pain Orientation Left  Pain Descriptors / Indicators Aching  Pain Type Acute pain  Pain Onset 1 to 4 weeks ago  Pain Frequency Constant  Aggravating Factors  movement, weight through LE  Pain Relieving Factors rest      12/27/18 1409  Transfers  Transfers Sit to Stand;Stand to SLockheed MartinTransfers  Sit to Stand 4: Min guard;With upper extremity assist;From chair/3-in-1  Stand to Sit 5: Supervision;With upper extremity assist;To chair/3-in-1  Stand Pivot Transfers 4: Min assist  Stand Pivot Transfer Details (indicate cue type and reason) transport chair <> Nustep with cues for technique.  Ambulation/Gait  Ambulation/Gait Yes  Ambulation/Gait Assistance 4: Min guard  Ambulation/Gait Assistance Details cues on posture, walker position with gait and for step placement   Ambulation Distance (Feet) 20 Feet (x1, 70 x2, 95 x1)  Assistive device Rolling walker  Gait Pattern Step-through pattern;Decreased stride length;Trunk flexed;Narrow base of support  Ambulation Surface Level;Indoor  Knee/Hip Exercises: Aerobic  Nustep Level 2 with UE/LE for 8 minutes with goal >/=30 steps per minute for strengthening and activity tolerance.   Knee/Hip Exercises: Standing  Heel Raises Both;1 set;10 reps;Limitations  Hip Flexion AROM;Stengthening;Both;1 set;Knee bent;Limitations (8 reps each leg)  Hip Flexion Limitations  with UE support on chair-alternating marching with cues on posture and technique  Hip Abduction AROM;Stengthening;Both;1 set;10 reps;Knee straight;Limitations  Abduction Limitations with UE support on chair: cues on technique and form   Hip Extension AROM;Stengthening;Both;1 set;10 reps;Knee straight;Limitations   Extension Limitations UE support on chair back: cues on technique and form  Heel Raises Limitations with UE support. cues on form and technique       PT Short Term Goals - 11/22/18 1452      PT SHORT TERM GOAL #1   Title  Patient's husband verbalizes fall risk prevention strategies. (All STGs Target Date: 11/25/2018)    Baseline  11/22/18: met per spouse report.     Status  Achieved      PT SHORT TERM GOAL #2   Title  patient ambulates 100' around furniture with RW with supervision.     Baseline  11/22/18: met today    Time  --    Period  --    Status  Achieved      PT SHORT TERM GOAL #3   Title  Patient sit to/from stand from chairs with armrests without touching external support to stabilize with supervision.     Baseline  11/22/18: met today    Time  --    Period  --    Status  Achieved      PT SHORT TERM GOAL #4   Title  Patient & husband demonstrate understanding of initial HEP.     Baseline  11/22/18: met with current HEP    Status  Achieved        PT Long Term Goals - 10/27/18 1008      PT LONG TERM GOAL #1   Title  Patient & husband verbalize understanding of fall prevention strategies including appropriate assistive device. (All LTGs Target Date: 01/20/2019)    Time  12    Period  Weeks    Status  New    Target Date  01/20/19      PT LONG TERM GOAL #2   Title  Berg Balance >36/56 to indicate lower fall risk.     Time  12    Period  Weeks    Status  New    Target Date  01/20/19      PT LONG TERM GOAL #3   Title  Timed Up & Go with LRAD modified independent <13.5 seconds    Time  12    Period  Weeks    Status  New    Target Date  01/20/19      PT LONG TERM GOAL #4   Title  Patient ambulates 100' around furniture with LRAD with no balance losses with supervision for cognitive deficits only to enable safe mobility within her home without assistance.     Time  12    Period  Weeks    Status  New    Target Date  01/20/19      PT LONG TERM GOAL #5   Title   Patient ambulates 400' outdoors on paved surfaces with LRAD with supervision for community mobility.     Time  12    Period  Weeks    Status  New    Target Date  01/20/19      Additional Long Term Goals   Additional Long Term Goals  Yes      PT LONG TERM GOAL #6   Title  Patient negotiates ramps, curbs & 2 steps similar to home entrance with  LRAD with supervision for safe community access.    Time  12    Period  Weeks    Status  New    Target Date  01/20/19      PT LONG TERM GOAL #7   Title  Patient and husband demonstrate & verbalize understanding of ongoing HEP & fitness plan.     Time  12    Period  Weeks    Status  New    Target Date  01/20/19          12/27/18 1409  Plan  Clinical Impression Statement Today's skilled session focused strengthening and gait with RW. No increase in pain reported with session today. The pt is making steady progress toward goals and should benefit from continued PT to progress toward unmet goals.   Pt will benefit from skilled therapeutic intervention in order to improve on the following deficits Abnormal gait;Decreased activity tolerance;Decreased balance;Decreased cognition;Decreased coordination;Decreased endurance;Decreased knowledge of use of DME;Decreased mobility;Decreased strength;Dizziness;Postural dysfunction;Pain  Rehab Potential Good  PT Frequency 2x / week  PT Duration 12 weeks  PT Treatment/Interventions ADLs/Self Care Home Management;DME Instruction;Gait training;Stair training;Functional mobility training;Therapeutic activities;Therapeutic exercise;Balance training;Neuromuscular re-education;Patient/family education;Vestibular  PT Next Visit Plan continue to work on core strengthening/stability, standing tolerance with UE support, gait with RW as pain allows  PT [place] and Agree with Plan of Care Patient;Family member/caregiver  Family Member Consulted husband, Nelson Chimes          Patient will benefit from skilled therapeutic intervention in order to improve the following deficits and impairments:  Abnormal gait, Decreased activity tolerance, Decreased balance, Decreased cognition, Decreased coordination, Decreased endurance, Decreased knowledge of use of DME, Decreased mobility, Decreased strength, Dizziness, Postural dysfunction, Pain  Visit Diagnosis: Muscle weakness (generalized)  Other abnormalities of gait and mobility  Hemiplegia and hemiparesis following cerebral infarction affecting right dominant side (HCC)  Abnormal posture     Problem List Patient Active Problem List   Diagnosis Date Noted  . Expressive aphasia 11/17/2018  . Gait disturbance, post-stroke 11/17/2018  . Hemiparesis affecting dominant side as late effect of cerebrovascular accident ([place]) 11/17/2018  . Aphasia, post-stroke 11/17/2018  . Aphasia as late effect of stroke 11/08/2018  . 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 Left middle cerebral artery stroke ([place]) 10/06/2018  . Dyslipidemia   . Diabetes mellitus type 2 in nonobese (HCC)   . Atrial fibrillation ([place])   . Tachypnea   . Stage 3 chronic kidney disease ([place])   . Stroke [facility]) ischemic embolic L MCA  d/t AF 72/25/7505  . 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 Frequent falls 10/13/2017  . Vitamin D deficiency 04/22/2017  . Slurring of speech 03/31/2017  . Back pain 09/14/2016  . Compression fracture of thoracic vertebra (HCC) 09/14/2016  . S/P vertebroplasty 09/14/2016  . Chronic diastolic heart failure ([place]) 09/14/2016  . HTN (hypertension) 09/14/2016  . History of CVA (cerebrovascular accident) 09/14/2016  . Hypokalemia 09/11/2016  . Paraspinal hematoma 09/11/2016  . Osteoporosis 05/27/2016  . Hypothyroidism 09/11/2014  . Persistent atrial fibrillation ([place]) 12/15/2012  . HLD (hyperlipidemia) 03/27/2011    Willow Ora, PTA, [facility] [address] [place], [place] [address] [phone] 12/29/18, 11:45 AM   Name: [name] MRN: [id number] Date of Birth: 26-Nov-1929

## 2018-12-30 ENCOUNTER — Ambulatory Visit: Payer: Medicare Other

## 2018-12-30 ENCOUNTER — Ambulatory Visit: Payer: Medicare Other | Admitting: Physical Therapy

## 2018-12-30 ENCOUNTER — Encounter: Payer: Medicare Other | Admitting: Occupational Therapy

## 2018-12-30 ENCOUNTER — Encounter: Payer: Self-pay | Admitting: Physical Therapy

## 2018-12-30 DIAGNOSIS — R482 Apraxia: Secondary | ICD-10-CM

## 2018-12-30 DIAGNOSIS — R4701 Aphasia: Secondary | ICD-10-CM | POA: Diagnosis not present

## 2018-12-30 DIAGNOSIS — I69351 Hemiplegia and hemiparesis following cerebral infarction affecting right dominant side: Secondary | ICD-10-CM

## 2018-12-30 DIAGNOSIS — M6281 Muscle weakness (generalized): Secondary | ICD-10-CM

## 2018-12-30 DIAGNOSIS — M79605 Pain in left leg: Secondary | ICD-10-CM | POA: Diagnosis not present

## 2018-12-30 DIAGNOSIS — R2681 Unsteadiness on feet: Secondary | ICD-10-CM

## 2018-12-30 DIAGNOSIS — R41841 Cognitive communication deficit: Secondary | ICD-10-CM

## 2018-12-30 DIAGNOSIS — R2689 Other abnormalities of gait and mobility: Secondary | ICD-10-CM

## 2018-12-30 DIAGNOSIS — Z9181 History of falling: Secondary | ICD-10-CM

## 2018-12-30 DIAGNOSIS — R293 Abnormal posture: Secondary | ICD-10-CM

## 2018-12-30 NOTE — Patient Instructions (Addendum)
Fall Prevention in the Home, Adult  Falls can cause injuries. They can happen to people of all ages. There are many things you can do to make your home safe and to help prevent falls. Ask for help when making these changes, if needed.  What actions can I take to prevent falls?  General Instructions  · Use good lighting in all rooms. Replace any light bulbs that burn out.  · Turn on the lights when you go into a dark area. Use night-lights.  · Keep items that you use often in easy-to-reach places. Lower the shelves around your home if necessary.  · Set up your furniture so you have a clear path. Avoid moving your furniture around.  · Do not have throw rugs and other things on the floor that can make you trip.  · Avoid walking on wet floors.  · If any of your floors are uneven, fix them.  · Add color or contrast paint or tape to clearly mark and help you see:  ? Any grab bars or handrails.  ? First and last steps of stairways.  ? Where the edge of each step is.  · If you use a stepladder:  ? Make sure that it is fully opened. Do not climb a closed stepladder.  ? Make sure that both sides of the stepladder are locked into place.  ? Ask someone to hold the stepladder for you while you use it.  · If there are any pets around you, be aware of where they are.  What can I do in the bathroom?         · Keep the floor dry. Clean up any water that spills onto the floor as soon as it happens.  · Remove soap buildup in the tub or shower regularly.  · Use non-skid mats or decals on the floor of the tub or shower.  · Attach bath mats securely with double-sided, non-slip rug tape.  · If you need to sit down in the shower, use a plastic, non-slip stool.  · Install grab bars by the toilet and in the tub and shower. Do not use towel bars as grab bars.  What can I do in the bedroom?  · Make sure that you have a light by your bed that is easy to reach.  · Do not use any sheets or blankets that are too big for your bed. They should  not hang down onto the floor.  · Have a firm chair that has side arms. You can use this for support while you get dressed.  What can I do in the kitchen?  · Clean up any spills right away.  · If you need to reach something above you, use a strong step stool that has a grab bar.  · Keep electrical cords out of the way.  · Do not use floor polish or wax that makes floors slippery. If you must use wax, use non-skid floor wax.  What can I do with my stairs?  · Do not leave any items on the stairs.  · Make sure that you have a light switch at the top of the stairs and the bottom of the stairs. If you do not have them, ask someone to add them for you.  · Make sure that there are handrails on both sides of the stairs, and use them. Fix handrails that are broken or loose. Make sure that handrails are as long as the stairways.  ·   Install non-slip stair treads on all stairs in your home.  · Avoid having throw rugs at the top or bottom of the stairs. If you do have throw rugs, attach them to the floor with carpet tape.  · Choose a carpet that does not hide the edge of the steps on the stairway.  · Check any carpeting to make sure that it is firmly attached to the stairs. Fix any carpet that is loose or worn.  What can I do on the outside of my home?  · Use bright outdoor lighting.  · Regularly fix the edges of walkways and driveways and fix any cracks.  · Remove anything that might make you trip as you walk through a door, such as a raised step or threshold.  · Trim any bushes or trees on the path to your home.  · Regularly check to see if handrails are loose or broken. Make sure that both sides of any steps have handrails.  · Install guardrails along the edges of any raised decks and porches.  · Clear walking paths of anything that might make someone trip, such as tools or rocks.  · Have any leaves, snow, or ice cleared regularly.  · Use sand or salt on walking paths during winter.  · Clean up any spills in your garage right  away. This includes grease or oil spills.  What other actions can I take?  · Wear shoes that:  ? Have a low heel. Do not wear high heels.  ? Have rubber bottoms.  ? Are comfortable and fit you well.  ? Are closed at the toe. Do not wear open-toe sandals.  · Use tools that help you move around (mobility aids) if they are needed. These include:  ? Canes.  ? Walkers.  ? Scooters.  ? Crutches.  · Review your medicines with your doctor. Some medicines can make you feel dizzy. This can increase your chance of falling.  Ask your doctor what other things you can do to help prevent falls.  Where to find more information  · Centers for Disease Control and Prevention, [employer]AD[employer]: https://cdc.gov  · National Institute on Aging: https://go4life.nia.nih.gov  Contact a doctor if:  · You are afraid of falling at home.  · You feel weak, drowsy, or dizzy at home.  · You fall at home.  Summary  · There are many simple things that you can do to make your home safe and to help prevent falls.  · Ways to make your home safe include removing tripping hazards and installing grab bars in the bathroom.  · Ask for help when making these changes in your home.  This information is not intended to replace advice given to you by your health care provider. Make sure you discuss any questions you have with your health care provider.  Document Released: 08/01/2009 Document Revised: 05/20/2017 Document Reviewed: 05/20/2017  Elsevier Interactive Patient Education © 2019 [employer]

## 2018-12-30 NOTE — Therapy (Signed)
[facility] [address] [place], [place], [address] Phone: [phone]   Fax:  [phone]  Physical Therapy Treatment  Patient Details  Name: [name] MRN: [id number] Date of Birth: Aug 20, 1930 Referring Provider (PT): Alysia Penna, MD   Encounter Date: 12/30/2018  PT End of Session - 12/30/18 1416    Visit Number  9    Number of Visits  25    Date for PT Re-Evaluation  01/24/19    Authorization Type  Medicare & AARP  100% covered;  follow Medicare guidelines    PT Start Time  1406   late arriving from speech therapy   PT Stop Time  1444    PT Time Calculation (min)  38 min    Equipment Utilized During Treatment  Gait belt    Activity Tolerance  Patient tolerated treatment well;Patient limited by fatigue;Patient limited by pain   limited by knee pain today   Behavior During Therapy  Flat affect;WFL for tasks assessed/performed       Past Medical History:  Diagnosis Date  . Atrial fibrillation ([place])   . Chronic anticoagulation   . Chronic anticoagulation   . CVA (cerebrovascular accident) ([place])   . Diabetes mellitus    pt & husband deny any knowledge of this   . History of chicken pox   . Hyperlipidemia   . Hypertension     Past Surgical History:  Procedure Laterality Date  . APPENDECTOMY    . BACK SURGERY    . CARDIAC CATHETERIZATION  11/09/91   EF 70%  . DILATION AND CURETTAGE OF UTERUS    . IR PERCUTANEOUS ART THROMBECTOMY/INFUSION INTRACRANIAL INC DIAG ANGIO  10/03/2018  . RADIOLOGY WITH ANESTHESIA N/A 10/03/2018   Procedure: IR WITH ANESTHESIA;  Surgeon: Radiologist, Medication, MD;  Location: [facility];  Service: Radiology;  Laterality: N/A;    There were no vitals filed for this visit.  Subjective Assessment - 12/30/18 1404    Subjective  Had another fall. Was bending fwd to open bottom drawer of her dresser and lost balance. Spouse was with her. Landed on her left hip again  and has a bruise on her right hand. Spouse assist her up. Reports a mild increase in her hip and back pain.     Patient is accompained by:  Family member   spouse with her   Pertinent History  HTN, DM, CVA 45yr ago, A-Fib, chronic LBP    Limitations  Lifting;Standing;Walking;House hold activities    Patient Stated Goals  To talk better, to walk & balance better.    Currently in Pain?  Yes    Pain Score  9     Pain Location  Hip    Pain Orientation  Left    Pain Descriptors / Indicators  Aching;Sore;Tender    Pain Type  Acute pain;Chronic pain    Pain Onset  More than a month ago    Pain Frequency  Constant    Aggravating Factors   movement, weight through LE    Pain Relieving Factors  rest    Pain Score  8    Pain Location  Back    Pain Orientation  Lower    Pain Descriptors / Indicators  Aching;Sore    Pain Type  Acute pain;Chronic pain    Pain Onset  More than a month ago    Pain Frequency  Intermittent    Aggravating Factors   standing and walking    Pain  Relieving Factors  rest, tylenol              [facility] Adult PT Treatment/Exercise - 12/30/18 1417      Transfers   Transfers  Sit to Stand;Stand to Sit    Sit to Stand  4: Min guard;With upper extremity assist;From chair/3-in-1    Stand to Sit  4: Min guard;With upper extremity assist;To chair/3-in-1      Ambulation/Gait   Ambulation/Gait  Yes    Ambulation/Gait Assistance  4: Min guard    Ambulation/Gait Assistance Details  cues on posture, step placement and walker postion with gait     Ambulation Distance (Feet)  50 Feet    Assistive device  Rolling walker    Gait Pattern  Step-through pattern;Decreased stride length;Trunk flexed;Narrow base of support    Ambulation Surface  Level;Indoor      Self-Care   Self-Care  Other Self-Care Comments    Other Self-Care Comments   education of fall prevention strategies to pt and spouse      Exercises   Exercises  Other Exercises    Other Exercises   standing with RW  support: heel raises, alternating marching x10 reps each with cues on posture and ex form. min guard assist for balance.              PT Education - 12/30/18 1748    Education Details  Provided education on fall prevention strategies due to recent fall    Person(s) Educated  Patient;Spouse    Methods  Explanation;Demonstration;Verbal cues;Handout    Comprehension  Verbalized understanding;Returned demonstration;Need further instruction       PT Short Term Goals - 11/22/18 1452      PT SHORT TERM GOAL #1   Title  Patient's husband verbalizes fall risk prevention strategies. (All STGs Target Date: 11/25/2018)    Baseline  11/22/18: met per spouse report.     Status  Achieved      PT SHORT TERM GOAL #2   Title  patient ambulates 100' around furniture with RW with supervision.     Baseline  11/22/18: met today    Time  --    Period  --    Status  Achieved      PT SHORT TERM GOAL #3   Title  Patient sit to/from stand from chairs with armrests without touching external support to stabilize with supervision.     Baseline  11/22/18: met today    Time  --    Period  --    Status  Achieved      PT SHORT TERM GOAL #4   Title  Patient & husband demonstrate understanding of initial HEP.     Baseline  11/22/18: met with current HEP    Status  Achieved        PT Long Term Goals - 10/27/18 1008      PT LONG TERM GOAL #1   Title  Patient & husband verbalize understanding of fall prevention strategies including appropriate assistive device. (All LTGs Target Date: 01/20/2019)    Time  12    Period  Weeks    Status  New    Target Date  01/20/19      PT LONG TERM GOAL #2   Title  Berg Balance >36/56 to indicate lower fall risk.     Time  12    Period  Weeks    Status  New    Target Date  01/20/19  PT LONG TERM GOAL #3   Title  Timed Up & Go with LRAD modified independent <13.5 seconds    Time  12    Period  Weeks    Status  New    Target Date  01/20/19      PT LONG TERM GOAL  #4   Title  Patient ambulates 100' around furniture with LRAD with no balance losses with supervision for cognitive deficits only to enable safe mobility within her home without assistance.     Time  12    Period  Weeks    Status  New    Target Date  01/20/19      PT LONG TERM GOAL #5   Title  Patient ambulates 400' outdoors on paved surfaces with LRAD with supervision for community mobility.     Time  12    Period  Weeks    Status  New    Target Date  01/20/19      Additional Long Term Goals   Additional Long Term Goals  Yes      PT LONG TERM GOAL #6   Title  Patient negotiates ramps, curbs & 2 steps similar to home entrance with LRAD with supervision for safe community access.    Time  12    Period  Weeks    Status  New    Target Date  01/20/19      PT LONG TERM GOAL #7   Title  Patient and husband demonstrate & verbalize understanding of ongoing HEP & fitness plan.     Time  12    Period  Weeks    Status  New    Target Date  01/20/19            Plan - 12/30/18 1416    Clinical Impression Statement  Today's skilled session intially focused on education on fall prevention strategies due to pt's most recent fall when trying to get clothing out of her bottom dresser drawer. Remainder of session continued to address LE strengthening and gait with RW with no imcr in pain reported. Pt continues to say/point at her thigh when asked about pain and say "it's okay" when asking about her back. Pt's spouse has inquired about use of modilites every visit since she returned to PT despite education that was provided with the last 2 sessions that pt's decr sensation and communcation issues make modalites not safe. Spouse stating his daughter, who is an Therapist, sports, said it would be safe. Will have primary PT address these concerns.               Rehab Potential  Good    PT Frequency  2x / week    PT Duration  12 weeks    PT Treatment/Interventions  ADLs/Self Care Home Management;DME  Instruction;Gait training;Stair training;Functional mobility training;Therapeutic activities;Therapeutic exercise;Balance training;Neuromuscular re-education;Patient/family education;Vestibular    PT Next Visit Plan  10th visit progress note due; continue to work on core strengthening/stability, standing tolerance with UE support, gait with RW as pain allows    PT [place] and Agree with Plan of Care  Patient;Family member/caregiver    Family Member Consulted  husband, Nelson Chimes       Patient will benefit from skilled therapeutic intervention in order to improve the following deficits and impairments:  Abnormal gait, Decreased activity tolerance, Decreased balance, Decreased cognition, Decreased coordination, Decreased endurance, Decreased knowledge of use of DME, Decreased mobility,  Decreased strength, Dizziness, Postural dysfunction, Pain  Visit Diagnosis: Muscle weakness (generalized)  Other abnormalities of gait and mobility  Hemiplegia and hemiparesis following cerebral infarction affecting right dominant side (HCC)  Abnormal posture  History of falling  Unsteadiness on feet     Problem List Patient Active Problem List   Diagnosis Date Noted  . Expressive aphasia 11/17/2018  . Gait disturbance, post-stroke 11/17/2018  . Hemiparesis affecting dominant side as late effect of cerebrovascular accident ([place]) 11/17/2018  . Aphasia, post-stroke 11/17/2018  . Aphasia as late effect of stroke 11/08/2018  . 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 Left middle cerebral artery stroke ([place]) 10/06/2018  . Dyslipidemia   . Diabetes mellitus type 2 in nonobese (HCC)   . Atrial fibrillation ([place])   . Tachypnea   . Stage 3 chronic kidney disease ([place])   . Stroke [facility]) ischemic embolic L MCA  d/t AF 84/85/9276  . 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 Frequent falls 10/13/2017  . Vitamin D deficiency 04/22/2017  . Slurring of speech  03/31/2017  . Back pain 09/14/2016  . Compression fracture of thoracic vertebra (HCC) 09/14/2016  . S/P vertebroplasty 09/14/2016  . Chronic diastolic heart failure ([place]) 09/14/2016  . HTN (hypertension) 09/14/2016  . History of CVA (cerebrovascular accident) 09/14/2016  . Hypokalemia 09/11/2016  . Paraspinal hematoma 09/11/2016  . Osteoporosis 05/27/2016  . Hypothyroidism 09/11/2014  . Persistent atrial fibrillation ([place]) 12/15/2012  . HLD (hyperlipidemia) 03/27/2011    Willow Ora, PTA, [facility] [address] [place], [place] [address] [phone] 12/30/18, 5:54 PM   Name: [name] MRN: [id number] Date of Birth: Jun 08, 1930

## 2018-12-30 NOTE — Patient Instructions (Signed)
Work with your device in order to communicate. Most of the time we can't understand when you talk.

## 2018-12-30 NOTE — Therapy (Signed)
[facility] [address] [place], [place], [address] Phone: [phone]   Fax:  [phone]  Speech Language Pathology Treatment  Patient Details  Name: [name] MRN: [id number] Date of Birth: 1930/07/22 Referring Provider (SLP): Dr. Letta Pate   Encounter Date: 12/30/2018  End of Session - 12/30/18 1435    Visit Number  16    Number of Visits  25    Date for SLP Re-Evaluation  01/25/19    SLP Start Time  1319    SLP Stop Time   1401    SLP Time Calculation (min)  42 min    Activity Tolerance  Patient tolerated treatment well       Past Medical History:  Diagnosis Date  . Atrial fibrillation ([place])   . Chronic anticoagulation   . Chronic anticoagulation   . CVA (cerebrovascular accident) ([place])   . Diabetes mellitus    pt & husband deny any knowledge of this   . History of chicken pox   . Hyperlipidemia   . Hypertension     Past Surgical History:  Procedure Laterality Date  . APPENDECTOMY    . BACK SURGERY    . CARDIAC CATHETERIZATION  11/09/91   EF 70%  . DILATION AND CURETTAGE OF UTERUS    . IR PERCUTANEOUS ART THROMBECTOMY/INFUSION INTRACRANIAL INC DIAG ANGIO  10/03/2018  . RADIOLOGY WITH ANESTHESIA N/A 10/03/2018   Procedure: IR WITH ANESTHESIA;  Surgeon: Radiologist, Medication, MD;  Location: [facility];  Service: Radiology;  Laterality: N/A;    There were no vitals filed for this visit.  Subjective Assessment - 12/30/18 1322    Subjective  "(Whitney) talked to me about 5 minutes! I thought it would be longer."    Patient is accompained by:  Family member   husband           ADULT SLP TREATMENT - 12/30/18 1323      General Information   Behavior/Cognition  Alert;Cooperative;Pleasant mood;Requires cueing      Treatment Provided   Treatment provided  Cognitive-Linquistic      Pain Assessment   Pain Assessment  0-10    Pain Score  7     Pain Location  lt leg-hip    Pain  Descriptors / Indicators  Sore      Cognitive-Linquistic Treatment   Treatment focused on  Aphasia;Apraxia    Skilled Treatment  "With all that's going on (coronavirus), I didn't have time to take any pictures." Pt husband expresses concern about ramifications of coronavirus as reason why he did not take pictures to add to pt's SGD.  Husband expressed frustration at what he referred to as "5-minute phone call" with SGD representative yesterday. Today, SLP focused on two areas with pt: error awareness and  usage of device to communicate effectively. Pt demo'd understanding of errors approx 50% of the time by shaking head or by attempting revisions, which were unsuccessful 100%. Pt navigated to [employer], [employer] (a favorite breakfsat item), jacket (which she "yelled at" husband about this afternoon) with mod-max A faded to mod A. Pt often pressed homepage icon when already on the homepage. On the "me" page, pt spontaneously answered questions using the icons 1/4 with min-mod A to use the icons. One question, she spontaneously stated her birthday. Pt's husband remarked about pt's talking "coming back". SLP re-educated husband that pt's speech would remain impaired and the SGD was likely pt's best alternative for verbal communication. Pt's spontaneous verbalization today communicated  meaningful and effective message intent <20% of the time. Pt did not use compensatory measures for communcation.       Assessment / Recommendations / Plan   Plan  Continue with current plan of care      Progression Toward Goals   Progression toward goals  Progressing toward goals       SLP Education - 12/30/18 1434    Education Details  pt expressive language is impaired and does not make sense much of the time, SGD navigation    Person(s) Educated  Patient;Spouse    Methods  Explanation;Demonstration    Comprehension  Verbalized understanding;Returned demonstration;Verbal cues required;Need further instruction       SLP  Short Term Goals - 12/27/18 1418      SLP SHORT TERM GOAL #1   Title  Pt will demo awareness of 50% of errors in a functional task with usual mod A (verbal/written cues).     Time  1    Period  Weeks    Status  Partially Met      SLP SHORT TERM GOAL #2   Title  Pt will communicate preferences or needs (food, activities, medical etc) x 3 sessions with usual mod A (AAC if necessary).    Baseline  11-25-18 (vegetables), 12-12-18 (fruits, vegetables, tv dinners)    Time  1    Period  Weeks    Status  Partially Met      SLP SHORT TERM GOAL #3   Title  Pt will communicate personal details (medical history, family, name, age, etc) with usual mod A x 3 sessions (AAC if necessary)    Baseline  pain level 12-12-18, 12/19/18    Time  1    Period  Weeks    Status  Partially Met       SLP Long Term Goals - 12/30/18 1438      SLP LONG TERM GOAL #1   Title  Pt will attempt error correction in 3/5 opportunities when cued appropriately by communication partner x 3 sessions.    Baseline  word level 12/27/18    Time  5    Period  Weeks    Status  On-going      SLP LONG TERM GOAL #2   Title  Family will demo understanding of at least 4 appropriate ways to enhance pt's comprehension/expression during 4 sessions.     Time  5    Period  Weeks    Status  On-going      SLP LONG TERM GOAL #3   Title  Pt will reduce social isolation risks by communicating social introductions, common social messages, and personal interests using multimodal communication with occasional min A from communication partner over 4 sessions.    Time  5    Period  Weeks    Status  On-going      SLP LONG TERM GOAL #4   Title  Pt/family will report improvement in understanding of pt's needs and requests than prior to ST.    Time  5    Period  Weeks    Status  On-going       Plan - 12/30/18 1435    Clinical Impression Statement  Patient presents with severe expressive aphasia, with expressive > receptive deficits. Pt today  with somewhat expanded functional spontaneous phrase responses (overlearned topics). At times pt was frequently unaware of errors. Auditory repetition was helpful in improving awareness. Husband continues to learn how to assist pt with SGD  at home; pt used device during today's session with cues to press icons (~60%) as well as spontaneously pressing icons (~30%). No response to request to press icon at ~10%. Husband cont to be very frustrated with pt's responses to him at home when he does not understand pt. Hopefully with more work on raising pt's awareness of errors she will be more apt to go to [place] at home, to communicate. I recommend cont'd skilled ST to address incr'd pt awareness of verbal errors, to maximize communication and to train communication partner for pt use of AAC for wants/needs, safety, independence and QOL.     Speech Therapy Frequency  2x / week    Duration  --   12 weeks or 25 visits   Treatment/Interventions  Cognitive reorganization;Multimodal communcation approach;Compensatory strategies;Language facilitation;Compensatory techniques;Cueing hierarchy;Internal/external aids;Functional tasks;SLP instruction and feedback;Patient/family education    Potential to Achieve Goals  Good    Potential Considerations  Cooperation/participation level;Severity of impairments;Ability to learn/carryover information       Patient will benefit from skilled therapeutic intervention in order to improve the following deficits and impairments:   Aphasia  Apraxia  Cognitive communication deficit    Problem List Patient Active Problem List   Diagnosis Date Noted  . Expressive aphasia 11/17/2018  . Gait disturbance, post-stroke 11/17/2018  . Hemiparesis affecting dominant side as late effect of cerebrovascular accident ([place]) 11/17/2018  . Aphasia, post-stroke 11/17/2018  . Aphasia as late effect of stroke 11/08/2018  . 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 Left middle cerebral artery  stroke ([place]) 10/06/2018  . Dyslipidemia   . Diabetes mellitus type 2 in nonobese (HCC)   . Atrial fibrillation ([place])   . Tachypnea   . Stage 3 chronic kidney disease ([place])   . Stroke [facility]) ischemic embolic L MCA  d/t AF 24/49/7530  . 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 Frequent falls 10/13/2017  . Vitamin D deficiency 04/22/2017  . Slurring of speech 03/31/2017  . Back pain 09/14/2016  . Compression fracture of thoracic vertebra (HCC) 09/14/2016  . S/P vertebroplasty 09/14/2016  . Chronic diastolic heart failure ([place]) 09/14/2016  . HTN (hypertension) 09/14/2016  . History of CVA (cerebrovascular accident) 09/14/2016  . Hypokalemia 09/11/2016  . Paraspinal hematoma 09/11/2016  . Osteoporosis 05/27/2016  . Hypothyroidism 09/11/2014  . Persistent atrial fibrillation ([place]) 12/15/2012  . HLD (hyperlipidemia) 03/27/2011    [facility] ,MS, CCC-SLP  12/30/2018, 2:39 PM  [facility] [address] [place], [place], [address] Phone: [phone]   Fax:  [phone]   Name: [name] MRN: [id number] Date of Birth: [birth year]

## 2019-01-02 ENCOUNTER — Other Ambulatory Visit: Payer: Self-pay

## 2019-01-02 NOTE — Patient Outreach (Signed)
Telephone outreach to patient to obtain mRs was successfully completed. mRs= 4. 

## 2019-01-03 ENCOUNTER — Encounter: Payer: Medicare Other | Admitting: Occupational Therapy

## 2019-01-03 ENCOUNTER — Ambulatory Visit: Payer: Medicare Other | Admitting: Physical Therapy

## 2019-01-03 ENCOUNTER — Ambulatory Visit: Payer: Medicare Other

## 2019-01-03 ENCOUNTER — Encounter: Payer: Self-pay | Admitting: Physical Therapy

## 2019-01-03 DIAGNOSIS — R4701 Aphasia: Secondary | ICD-10-CM

## 2019-01-03 DIAGNOSIS — M6281 Muscle weakness (generalized): Secondary | ICD-10-CM

## 2019-01-03 DIAGNOSIS — R482 Apraxia: Secondary | ICD-10-CM

## 2019-01-03 DIAGNOSIS — M79605 Pain in left leg: Secondary | ICD-10-CM | POA: Diagnosis not present

## 2019-01-03 DIAGNOSIS — R41841 Cognitive communication deficit: Secondary | ICD-10-CM

## 2019-01-03 DIAGNOSIS — I69351 Hemiplegia and hemiparesis following cerebral infarction affecting right dominant side: Secondary | ICD-10-CM

## 2019-01-03 DIAGNOSIS — R2681 Unsteadiness on feet: Secondary | ICD-10-CM | POA: Diagnosis not present

## 2019-01-03 DIAGNOSIS — R2689 Other abnormalities of gait and mobility: Secondary | ICD-10-CM | POA: Diagnosis not present

## 2019-01-03 NOTE — Therapy (Signed)
[facility] [address] [place], [place], [address] Phone: [phone]   Fax:  [phone]  Speech Language Pathology Treatment  Patient Details  Name: [name] MRN: [id number] Date of Birth: 05-24-1930 Referring Provider (SLP): Dr. Letta Pate   Encounter Date: 01/03/2019  End of Session - 01/03/19 1719    Visit Number  17    Number of Visits  25    Date for SLP Re-Evaluation  01/25/19    Authorization Time Period  90 days    SLP Start Time  1319    SLP Stop Time   1400    SLP Time Calculation (min)  41 min    Activity Tolerance  Patient tolerated treatment well       Past Medical History:  Diagnosis Date  . Atrial fibrillation ([place])   . Chronic anticoagulation   . Chronic anticoagulation   . CVA (cerebrovascular accident) ([place])   . Diabetes mellitus    pt & husband deny any knowledge of this   . History of chicken pox   . Hyperlipidemia   . Hypertension     Past Surgical History:  Procedure Laterality Date  . APPENDECTOMY    . BACK SURGERY    . CARDIAC CATHETERIZATION  11/09/91   EF 70%  . DILATION AND CURETTAGE OF UTERUS    . IR PERCUTANEOUS ART THROMBECTOMY/INFUSION INTRACRANIAL INC DIAG ANGIO  10/03/2018  . RADIOLOGY WITH ANESTHESIA N/A 10/03/2018   Procedure: IR WITH ANESTHESIA;  Surgeon: Radiologist, Medication, MD;  Location: [facility];  Service: Radiology;  Laterality: N/A;    There were no vitals filed for this visit.  Subjective Assessment - 01/03/19 1345    Patient is accompained by:  Family member   [name], husband   Currently in Pain?  Yes    Pain Score  7     Pain Location  Hip    Pain Orientation  Left    Pain Descriptors / Indicators  Aching;Sore    Pain Type  Acute pain;Chronic pain    Pain Onset  More than a month ago    Pain Frequency  Constant            ADULT SLP TREATMENT - 01/03/19 1346      General Information   Behavior/Cognition   Alert;Cooperative;Pleasant mood;Requires cueing      Treatment Provided   Treatment provided  Cognitive-Linquistic      Cognitive-Linquistic Treatment   Treatment focused on  Aphasia;Apraxia    Skilled Treatment  Pt husband has put pictures of all 8 drawers of pt's dresser on the device, and four pictures of pt's shirts, stating, "I wanna put pictures on there so she can point and show me what shirt she wants. I'm gonna put more on there." Pt indicated she does not want to use Lingraphica and wants to talk (flared her hand from her mouth and said "want to (gibberish)." SLP stated to pt that her speech maybe would not improve to the point of veing a verbal communicator, adn pt turned her head. SLP pointed to husband's phone and asked husband if he could put pictures on the phone - pt stated he could "but (the [place]) would have more on it". SLP questions pt's vs. husband's desire for pt to use the device. Pt awareness of errors during conversation was approx 30% demonstrated by self-correction of by facial expression. SLP repeated pt word salad or gibberish verbalizations to her approx 25% of the time  and pt nodded her head and smiled. SLP followed these repetitions up with verbal "We didn't understand what that meant."       Assessment / Recommendations / [place] with current plan of care      Progression Toward Goals   Progression toward goals  Not progressing toward goals (comment)   multimodal communication hindered from pt poor awareness      SLP Education - 01/03/19 1718    Education Details  pt expressive errors, she man not be Manufacturing systems engineer, pt's husband -phone might work for pictures he can use with pt    Person(s) Educated  Patient;Spouse    Methods  Explanation;Demonstration;Verbal cues    Comprehension  Verbal cues required;Need further instruction       SLP Short Term Goals - 01/03/19 1724      SLP SHORT TERM GOAL #1   Title  Pt will demo awareness of  50% of errors in a functional task with usual mod A (verbal/written cues).     Status  Partially Met      SLP SHORT TERM GOAL #2   Title  Pt will communicate preferences or needs (food, activities, medical etc) x 3 sessions with usual mod A (AAC if necessary).    Baseline  11-25-18 (vegetables), 12-12-18 (fruits, vegetables, tv dinners)    Status  Partially Met      SLP SHORT TERM GOAL #3   Title  Pt will communicate personal details (medical history, family, name, age, etc) with usual mod A x 3 sessions (AAC if necessary)    Baseline  pain level 12-12-18, 12/19/18    Status  Partially Met       SLP Long Term Goals - 01/03/19 1724      SLP LONG TERM GOAL #1   Title  Pt will attempt error correction in 3/5 opportunities when cued appropriately by communication partner x 3 sessions.    Baseline  word level 12/27/18    Time  4    Period  Weeks    Status  On-going      SLP LONG TERM GOAL #2   Title  Family will demo understanding of at least 4 appropriate ways to enhance pt's comprehension/expression during 4 sessions.     Time  4    Period  Weeks    Status  On-going      SLP LONG TERM GOAL #3   Title  Pt will reduce social isolation risks by communicating social introductions, common social messages, and personal interests using multimodal communication with occasional min A from communication partner over 4 sessions.    Time  4    Period  Weeks    Status  On-going      SLP LONG TERM GOAL #4   Title  Pt/family will report improvement in understanding of pt's needs and requests than prior to ST.    Time  4    Period  Weeks    Status  On-going       Plan - 01/03/19 1719    Clinical Impression Statement  Patient presents with severe expressive aphasia, with expressive > receptive deficits. Functional spontaneous phrase responses heard x3 today. Pt unaware of verbal errors approx 70% of the time, SLP repetition of pt's verbal output to incr pt awareness appeared intermittently  successful.  Pt used device during today's session with cues to press icons as well as spontaneously pressing icons 4/5 times. Hopefully raising pt's awareness of  errors she will be more apt to go to [place] at home, to communicate. Husband told to write down situations where pt attempts to communicate and he eventually understands pt communication- in order to analyze any trends/repetitions in hopes of making Linguraphica more functional for pt. I recommend cont'd skilled ST to address incr'd pt awareness of verbal errors, to maximize communication and to train communication partner for pt use of AAC for wants/needs, safety, independence and QOL.  If pt awareness does not improve and/or pt engagement with device does not improve recommend sending device back without recommendation for device placement for pt - and consider d/c until pt awareness improves.     Speech Therapy Frequency  2x / week    Duration  --   12 weeks or 25 visits   Treatment/Interventions  Cognitive reorganization;Multimodal communcation approach;Compensatory strategies;Language facilitation;Compensatory techniques;Cueing hierarchy;Internal/external aids;Functional tasks;SLP instruction and feedback;Patient/family education    Potential to Achieve Goals  Good    Potential Considerations  Cooperation/participation level;Severity of impairments;Ability to learn/carryover information       Patient will benefit from skilled therapeutic intervention in order to improve the following deficits and impairments:   Aphasia  Apraxia  Cognitive communication deficit    Problem List Patient Active Problem List   Diagnosis Date Noted  . Expressive aphasia 11/17/2018  . Gait disturbance, post-stroke 11/17/2018  . Hemiparesis affecting dominant side as late effect of cerebrovascular accident ([place]) 11/17/2018  . Aphasia, post-stroke 11/17/2018  . Aphasia as late effect of stroke 11/08/2018  . 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 Dyslipidemia   . Diabetes mellitus type 2 in nonobese (HCC)   . Atrial fibrillation ([place])   . Tachypnea   . Stage 3 chronic kidney disease ([place])   . Stroke [facility]) ischemic embolic L MCA  d/t AF 83/15/1761  . 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 Frequent falls 10/13/2017  . Vitamin D deficiency 04/22/2017  . Slurring of speech 03/31/2017  . Back pain 09/14/2016  . Compression fracture of thoracic vertebra (HCC) 09/14/2016  . S/P vertebroplasty 09/14/2016  . Chronic diastolic heart failure ([place]) 09/14/2016  . HTN (hypertension) 09/14/2016  . History of CVA (cerebrovascular accident) 09/14/2016  . Hypokalemia 09/11/2016  . Paraspinal hematoma 09/11/2016  . Osteoporosis 05/27/2016  . Hypothyroidism 09/11/2014  . Persistent atrial fibrillation ([place]) 12/15/2012  . HLD (hyperlipidemia) 03/27/2011    [facility] ,MS, CCC-SLP  01/03/2019, 5:25 PM  [facility] [address] [place], [place], [address] Phone: [phone]   Fax:  [phone]   Name: [name] MRN: [id number] Date of Birth: 1930/08/11

## 2019-01-03 NOTE — Therapy (Addendum)
[facility] [address] [place], [place], [address] Phone: [phone]   Fax:  [phone]  Physical Therapy Treatment & 10th visit Progress Note / addendum to plan of care  Patient Details  Name: [name] MRN: [id number] Date of Birth: October 08, 1930 Referring Provider (PT): Alysia Penna, MD  Progress Note Reporting Period 10/26/2018 to 01/03/2019  See note below for Objective Data and Assessment of Progress/Goals.   Plan - 01/04/19 0924    Clinical Impression Statement  This patient is experiencing pain in left lateral thigh which is limiting her rehab & mobility. PT is adding manual therapy & trial cyrotherapy onto plan of care. Due to impaired sensation & aphasia heat does not appear safe or appropriate. She hardward from previous ORIF, so PT also not recommending ultrasound and her aphasia would impair ability to inform PT of issue in timely fashion.      Rehab Potential  Good    PT Frequency  2x / week    PT Duration  12 weeks    PT Treatment/Interventions  ADLs/Self Care Home Management;DME Instruction;Gait training;Stair training;Functional mobility training;Therapeutic activities;Therapeutic exercise;Balance training;Neuromuscular re-education;Patient/family education;Vestibular;Cryotherapy;Manual techniques    PT Next Visit Plan  manual therapy & possible ice massage for pain. continue to work on core strengthening/stability, standing tolerance with UE support, gait with RW as pain allows    PT Combine and Agree with Plan of Care  Patient;Family member/caregiver    Family Member Consulted  husband, Nelson Chimes      Jamey Reas, PT, DPT PT Specializing in [place] 01/04/19 12:02 PM Phone:  [phone]  Fax:  [phone] [facility] [address] [place], [place] [address]        Encounter Date: 01/03/2019  PT  End of Session - 01/03/19 1412    Visit Number  10    Number of Visits  25    Date for PT Re-Evaluation  01/24/19    Authorization Type  Medicare & AARP  100% covered;  follow Medicare guidelines    PT Start Time  1406   late from ST   PT Stop Time  1447    PT Time Calculation (min)  41 min    Equipment Utilized During Treatment  Gait belt    Activity Tolerance  Patient tolerated treatment well;Patient limited by fatigue;Patient limited by pain   limited by knee pain today   Behavior During Therapy  Flat affect;WFL for tasks assessed/performed       Past Medical History:  Diagnosis Date  . Atrial fibrillation ([place])   . Chronic anticoagulation   . Chronic anticoagulation   . CVA (cerebrovascular accident) ([place])   . Diabetes mellitus    pt & husband deny any knowledge of this   . History of chicken pox   . Hyperlipidemia   . Hypertension     Past Surgical History:  Procedure Laterality Date  . APPENDECTOMY    . BACK SURGERY    . CARDIAC CATHETERIZATION  11/09/91   EF 70%  . DILATION AND CURETTAGE OF UTERUS    . IR PERCUTANEOUS ART THROMBECTOMY/INFUSION INTRACRANIAL INC DIAG ANGIO  10/03/2018  . RADIOLOGY WITH ANESTHESIA N/A 10/03/2018   Procedure: IR WITH ANESTHESIA;  Surgeon: Radiologist, Medication, MD;  Location: [facility];  Service: Radiology;  Laterality: N/A;    There were no vitals filed for this visit.  Subjective Assessment -  01/03/19 1407    Subjective  No new complaitns. No falls. Points to her left thigh as where her pain is.     Patient is accompained by:  Family member   spouse with her   Pertinent History  HTN, DM, CVA 57yr ago, A-Fib, chronic LBP    Limitations  Lifting;Standing;Walking;House hold activities    Patient Stated Goals  To talk better, to walk & balance better.    Currently in Pain?  Yes    Pain Score  7     Pain Location  Other (Comment)   thigh   Pain Orientation  Left    Pain Descriptors / Indicators  Aching;Sore    Pain Type  Acute  pain;Chronic pain    Pain Onset  More than a month ago    Pain Frequency  Constant    Aggravating Factors   movement, weigth through LE    Pain Relieving Factors  rest    Effect of Pain on Daily Activities  more difficult to ambulate             [facility] 1429      Transfers   Transfers  Sit to Stand;Stand to Sit;Stand Pivot Transfers    Sit to Stand  4: Min guard;With upper extremity assist;From chair/3-in-1    Stand to Sit  4: Min guard;With upper extremity assist;To chair/3-in-1      Knee/Hip Exercises: Standing   Heel Raises  Both;1 set;10 reps;Limitations    Heel Raises Limitations  with UE support. cues on form and technique    Hip Flexion  AROM;Stengthening;Both;1 set;10 reps;Knee bent    Hip Flexion Limitations  with UE support on chair-alternating marching with cues on posture and technique    Functional Squat  1 set;10 reps;Limitations    Functional Squat Limitations  with UE support on PTA shoulders- mini squats with assist for form and technique.       Knee/Hip Exercises: Seated   Long Arc Quad  AAROM;Strengthening;Both;1 set;10 reps;Weights;Limitations    Long Arc Quad Weight  2 lbs.    Long ACS[employer] CorporationLimitations  cues for form and to slow down    Hamstring Curl  AAROM;Strengthening;Both;1 set;10 reps;Limitations    Hamstring Limitations  with red band resistance- cues on form and slow, controlled movements       Knee/Hip Exercises: Sidelying   Hip ABduction  AAROM;Strengthening;Left;1 set;10 reps;Limitations    Hip ABduction Limitations  assist to maintain position- cues on form and technique    Clams  in right sidelying- 10 reps with left LE with cues on technique      Manual Therapy   Manual Therapy  Soft tissue mobilization;Myofascial release;Other (comment)   foam roller   Manual therapy comments  all manual therapy performed for decreased LE pain and muscle/tendon tightness.     Soft tissue mobilization  to left IT  band, hamstring and quadriceps    Myofascial Release  to left IT band, quads, and hamstrings    Other Manual Therapy  use of foam roll to left IT band for decreased tightness. palpable tendon/muscle popping noted x3 rolls with pt grimacing then saying "its better".                             PT Short Term Goals - 11/22/18 1452      PT SHORT TERM GOAL #1   Title  Patient's husband verbalizes  fall risk prevention strategies. (All STGs Target Date: 11/25/2018)    Baseline  11/22/18: met per spouse report.     Status  Achieved      PT SHORT TERM GOAL #2   Title  patient ambulates 100' around furniture with RW with supervision.     Baseline  11/22/18: met today    Time  --    Period  --    Status  Achieved      PT SHORT TERM GOAL #3   Title  Patient sit to/from stand from chairs with armrests without touching external support to stabilize with supervision.     Baseline  11/22/18: met today    Time  --    Period  --    Status  Achieved      PT SHORT TERM GOAL #4   Title  Patient & husband demonstrate understanding of initial HEP.     Baseline  11/22/18: met with current HEP    Status  Achieved        PT Long Term Goals - 10/27/18 1008      PT LONG TERM GOAL #1   Title  Patient & husband verbalize understanding of fall prevention strategies including appropriate assistive device. (All LTGs Target Date: 01/20/2019)    Time  12    Period  Weeks    Status  New    Target Date  01/20/19      PT LONG TERM GOAL #2   Title  Berg Balance >36/56 to indicate lower fall risk.     Time  12    Period  Weeks    Status  New    Target Date  01/20/19      PT LONG TERM GOAL #3   Title  Timed Up & Go with LRAD modified independent <13.5 seconds    Time  12    Period  Weeks    Status  New    Target Date  01/20/19      PT LONG TERM GOAL #4   Title  Patient ambulates 100' around furniture with LRAD with no balance losses with supervision for cognitive deficits only to enable safe mobility  within her home without assistance.     Time  12    Period  Weeks    Status  New    Target Date  01/20/19      PT LONG TERM GOAL #5   Title  Patient ambulates 400' outdoors on paved surfaces with LRAD with supervision for community mobility.     Time  12    Period  Weeks    Status  New    Target Date  01/20/19      Additional Long Term Goals   Additional Long Term Goals  Yes      PT LONG TERM GOAL #6   Title  Patient negotiates ramps, curbs & 2 steps similar to home entrance with LRAD with supervision for safe community access.    Time  12    Period  Weeks    Status  New    Target Date  01/20/19      PT LONG TERM GOAL #7   Title  Patient and husband demonstrate & verbalize understanding of ongoing HEP & fitness plan.     Time  12    Period  Weeks    Status  New    Target Date  01/20/19  Plan - 01/03/19 1412    Clinical Impression Statement  Today's skilled session involved discussion with primary PT at start of session on pain management strategies safe to try with patient. Will start with manual therapy today and possibly try ultrasound (PT to add to plan of care). Skilled session focused on use of manual techniques to left LE for pain management, then on ex's for LE strengthening. Added new ex's to HEP for standing strengthening with spouse present and educated on how to assist pt. The pt continues to report pain, however she is making slow progress toward goals.                 Rehab Potential  Good    PT Frequency  2x / week    PT Duration  12 weeks    PT Treatment/Interventions  ADLs/Self Care Home Management;DME Instruction;Gait training;Stair training;Functional mobility training;Therapeutic activities;Therapeutic exercise;Balance training;Neuromuscular re-education;Patient/family education;Vestibular    PT Next Visit Plan   continue to work on core strengthening/stability, standing tolerance with UE support, gait with RW as pain allows    PT [place] and Agree with Plan of Care  Patient;Family member/caregiver    Family Member Consulted  husband, Nelson Chimes       Patient will benefit from skilled therapeutic intervention in order to improve the following deficits and impairments:  Abnormal gait, Decreased activity tolerance, Decreased balance, Decreased cognition, Decreased coordination, Decreased endurance, Decreased knowledge of use of DME, Decreased mobility, Decreased strength, Dizziness, Postural dysfunction, Pain  Visit Diagnosis: Muscle weakness (generalized)  Other abnormalities of gait and mobility  Hemiplegia and hemiparesis following cerebral infarction affecting right dominant side (HCC)  Unsteadiness on feet     Problem List Patient Active Problem List   Diagnosis Date Noted  . Expressive aphasia 11/17/2018  . Gait disturbance, post-stroke 11/17/2018  . Hemiparesis affecting dominant side as late effect of cerebrovascular accident ([place]) 11/17/2018  . Aphasia, post-stroke 11/17/2018  . Aphasia as late effect of stroke 11/08/2018  . 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 Dyslipidemia   . Diabetes mellitus type 2 in nonobese (HCC)   . Atrial fibrillation ([place])   . Tachypnea   . Stage 3 chronic kidney disease ([place])   . Stroke [facility]) ischemic embolic L MCA  d/t AF 76/72/0947  . 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 Frequent falls 10/13/2017  . Vitamin D deficiency 04/22/2017  . Slurring of speech 03/31/2017  . Back pain 09/14/2016  . Compression fracture of thoracic vertebra (HCC) 09/14/2016  . S/P vertebroplasty 09/14/2016  . Chronic diastolic heart failure ([place]) 09/14/2016  . HTN (hypertension) 09/14/2016  . History of CVA (cerebrovascular accident) 09/14/2016  . Hypokalemia 09/11/2016  . Paraspinal hematoma 09/11/2016  . Osteoporosis 05/27/2016  . Hypothyroidism 09/11/2014  . Persistent atrial  fibrillation ([place]) 12/15/2012  . HLD (hyperlipidemia) 03/27/2011    Willow Ora, PTA, [facility] Outpatient Neuro [facility] [address], [place],  [address] [phone] 01/03/19, 7:52 PM   Name: Shawana Knoch MRN: [id number] Date of Birth: Jun 23, 1930

## 2019-01-06 ENCOUNTER — Ambulatory Visit: Payer: Medicare Other | Admitting: Physical Therapy

## 2019-01-06 ENCOUNTER — Ambulatory Visit: Payer: Medicare Other

## 2019-01-06 ENCOUNTER — Encounter: Payer: Medicare Other | Admitting: Occupational Therapy

## 2019-01-09 ENCOUNTER — Telehealth: Payer: Self-pay | Admitting: Speech Pathology

## 2019-01-09 NOTE — Telephone Encounter (Signed)
[name] was contacted today regarding the temporary closing of OP Rehab Services due to Covid-19. Therapist discussed plan of care with pt's husband [name] due to pt's severe aphasia. Husband expressed that he had no questions re: HEP from PT, and that he is making lists of personally relevant words/items to assist with developing communication system for pt.  Patient is interested in further information for an e-visit, virtual check in, or telehealth visit, if those services become available.    OP Rehabilitation Services will follow up with patients when we are able to resume care.  Rondel Baton, MS, CCC-SLP [facility] [address] [place], [place]  [address] Phone:  [phone] Fax:  [phone] \

## 2019-01-10 ENCOUNTER — Ambulatory Visit: Payer: Medicare Other | Admitting: Speech Pathology

## 2019-01-10 ENCOUNTER — Ambulatory Visit: Payer: Medicare Other | Admitting: Physical Therapy

## 2019-01-10 ENCOUNTER — Encounter: Payer: Medicare Other | Admitting: Occupational Therapy

## 2019-01-13 ENCOUNTER — Ambulatory Visit: Payer: Medicare Other | Admitting: Physical Therapy

## 2019-01-13 ENCOUNTER — Encounter: Payer: Medicare Other | Admitting: Occupational Therapy

## 2019-01-19 ENCOUNTER — Telehealth: Payer: Self-pay

## 2019-01-19 NOTE — Telephone Encounter (Signed)
Because pt is non-verbal, pt's husband was contacted today regarding temporary reduction of Outpatient Neuro Rehabilitation Services due to concerns for community transmission of COVID-19.  SLP confirmed patient was there at the residence with husband. Clinician assessed if patient needed to be seen in person by clinician (recent fall or acute injury that requires hands on assessment and advice, change in diet order, post-surgical, special cases, etc.).   Patient did not have an acute/special need that requires in person visit. Proceeded with phone call.  Therapist advised the patient's husband to continue to work with pt for  communication as best as possible at this time, and assured pt/husband had no unanswered questions or concerns at this time. Husband did not bring to light any pressing situations or circumstances for further discussion.  The patient was chosen by SLPs Sundra Aland and Skeet Latch as one to benefit most by continuing her plan of care by using in-person visits instead of virtual visits (via telephone or smartphone/mobile device). When clinic re-opens, husband confirmed they would like to be called to make additional appointments to resume ST in person.  Patient husband is aware we can be reached by telephone during limited business hours in the meantime.   Pierre Dellarocco, CCC-SLP

## 2019-01-27 ENCOUNTER — Telehealth: Payer: Self-pay

## 2019-01-27 NOTE — Telephone Encounter (Signed)
Pt husband called with question re: return of Lingraphica.  SLP reminded pt of conversation on 01-19-19 when SLP told husband of SLPs recommending return of the device at this time due to husband initiating all contact with Lingraphica and pt remains largely unaware of language errors.  Husband agreed with this choice, given SLP explanation (again). SLP reassured husband that SLPs will attempt to improve pt's error awareness and thus will hopefully increase pt's initiation of communication interaction with more efficient and effective means instead of solely husband's encouragement for pt to do this. SLP made husband aware (again) that these means may be low-tech as pt has not gravitated heavily towards higher-tech means to this point.   SLP made sure husband knew SLPs were available M-F from 10am-2pm through April.  Verdie Mosher ,MS, CCC-SLP

## 2019-01-27 NOTE — Therapy (Signed)
[facility] [address] [place], [place], [address] Phone: [phone]   Fax:  [phone]  Patient Details  Name: [name] MRN: [id number] Date of Birth: 11-17-1929 Referring Provider:  No ref. provider found  Encounter Date: 01/27/2019  SLP sent pt/husband a letter recapping yesterday's telephone encounter, below:   01-27-19  Dear [name] and [name] and I spoke on the phone yesterday (01-26-19) and we talked about returning the [place] to [place]. When we talked last week I had mentioned that [facility] and I think that is the best option due to [place] not initiating use of the device. At this time, she isn't initiating use of any alternative means to communicate except repeating what she has previously said, and maybe pointing occasionally. [name], you said you have to encourage her to do something other than talk in order to communicate most effectively. The most important thing right now if for [name] to realize when she's said something we don't understand. When that occurs more regularly, she might initiate using another means (other than talking) to communicate more effectively. We will work on this when she is able to return to our clinic when it's safe for her to do so. Until that time, we talked about realizing that she thinks she is correctly saying what she wants to say, and continuing to encourage her to use other means, even the pictures you have on your phone and other places.  We hope we can get her back in the clinic as soon as possible when we are scheduling patients at full capacity and without restrictions, and you and she agree it is not safe for her to come into the clinic right now. We will be in touch as soon as we can to schedule more appointments.  If you need [place], please call [phone] between the hours of 10am and 2pm. Thank you!  Sincerely,    Verdie Mosher, MS, CCC-SLP (also for  [facility], MS, CCC-SLP)   [facility] 01/27/2019, 4:49 PM  [facility] [address] [place], [place], [address] Phone: [phone]   Fax:  [phone]

## 2019-02-07 ENCOUNTER — Telehealth: Payer: Self-pay

## 2019-02-07 ENCOUNTER — Ambulatory Visit: Payer: Medicare Other | Admitting: Physical Medicine & Rehabilitation

## 2019-02-07 NOTE — Telephone Encounter (Signed)
Pt's husband [name] was contacted today regarding continued temporary reduction of Outpatient Rehabilitation Services due to concerns for community transmission of COVID-19. Pt's husband confirmed he was at home with pt currently and they were healthy.  [name] told SLP patient had not indicated or communicated she had any acute/special need that required an in person visit for ST at this time. Proceeded with phone call.  Therapist advised pt husband to cont to indicate to pt errors she may make with her speech when she does not realize those errors, with a frequency with which he is comfortable. SLP encouraged husband to cont to use any means necessary to assist pt - as in pictures, or pt pointing.  SLP confirmed to pt husband that when clinic opens to allow patients at full capacity, and when pt/husband deem it is safe to have pt return to clinic, we will schedule more visits to incr pt's awareness of language errors and to explore other communication options for pt.  Husband is aware we can be reached by telephone during limited business hours in the meantime.   SCHINKE,CARL, CCC-SLP

## 2019-02-07 NOTE — Telephone Encounter (Signed)
Virtual Visit Pre-Appointment Phone Call  Steps For Call: VERBAL CONSENT BY PHONE   1. Confirm consent - "In the setting of the current Covid19 crisis, you are scheduled for a (phone or video) visit with your provider on (date) at (time).  Just as we do with many in-office visits, in order for you to participate in this visit, we must obtain consent.  If you'd like, I can send this to your mychart (if signed up) or email for you to review.  Otherwise, I can obtain your verbal consent now.  All virtual visits are billed to your insurance company just like a normal visit would be.  By agreeing to a virtual visit, we'd like you to understand that the technology does not allow for your provider to perform an examination, and thus may limit your provider's ability to fully assess your condition. If your provider identifies any concerns that need to be evaluated in person, we will make arrangements to do so.  Finally, though the technology is pretty good, we cannot assure that it will always work on either your or our end, and in the setting of a video visit, we may have to convert it to a phone-only visit.  In either situation, we cannot ensure that we have a secure connection.  Are you willing to proceed?" STAFF: Did the patient verbally acknowledge consent to telehealth visit? Document YES/NO here:   2. Confirm the BEST phone number to call the day of the visit by including in appointment notes  3. Give patient instructions for MyChart download to smartphone OR Doximity/Doxy.me as below if video visit (depending on what platform provider is using)  4. Confirm that appointment type is correct in Epic appointment notes (VIDEO vs PHONE)  5. Advise patient to be prepared with their blood pressure, heart rate, weight, any heart rhythm information, their current medicines, and a piece of paper and pen handy for any instructions they may receive the day of their visit  6. Inform patient they will receive a  phone call 15 minutes prior to their appointment time (may be from unknown caller ID) so they should be prepared to answer    TELEPHONE CALL NOTE  [name] has been deemed a candidate for a follow-up tele-health visit to limit community exposure during the Covid-19 pandemic. I spoke with the patient via phone to ensure availability of phone/video source, confirm preferred email & phone number, and discuss instructions and expectations.  I reminded Janice KnudsenCarolyn W Martinez Martinez to be prepared with any vital sign and/or heart rhythm information that could potentially be obtained via home monitoring, at the time of her visit. I reminded [name] to expect a phone call prior to her visit.  Janice BodeStacy L Niva Martinez, CMA 02/07/2019 10:36 AM   INSTRUCTIONS FOR DOWNLOADING THE MYCHART APP TO SMARTPHONE  - The patient must first make sure to have activated MyChart and know their login information - If Apple, go to [employer] Store and type in MyChart in the search bar and download the app. If Android, ask patient to go to [employer] Play Store and type in [facility] in the search bar and download the app. The app is free but as with any other app downloads, their phone may require them to verify saved payment information or Apple/Android password.  - The patient will need to then log into the app with their MyChart username and password, and select [place] as their healthcare provider to link  the account. When it is time for your visit, go to the MyChart app, find appointments, and click Begin Video Visit. Be sure to Select Allow for your device to access the Microphone and Camera for your visit. You will then be connected, and your provider will be with you shortly.  **If they have any issues connecting, or need assistance please contact MyChart service desk [phone])  **If using a computer, in order to ensure the best quality for their visit they will need to use either of the  following Internet Browsers: [employer], or Google Chrome  IF USING DOXIMITY or DOXY.ME - The patient will receive a link just prior to their visit by text.     FULL LENGTH CONSENT FOR TELE-HEALTH VISIT   I hereby voluntarily request, consent and authorize [facility] and its employed or contracted physicians, physician assistants, nurse practitioners or other licensed health care professionals (the Practitioner), to provide me with telemedicine health care services (the "Services") as deemed necessary by the treating Practitioner. I acknowledge and consent to receive the Services by the Practitioner via telemedicine. I understand that the telemedicine visit will involve communicating with the Practitioner through live audiovisual communication technology and the disclosure of certain medical information by electronic transmission. I acknowledge that I have been given the opportunity to request an in-person assessment or other available alternative prior to the telemedicine visit and am voluntarily participating in the telemedicine visit.  I understand that I have the right to withhold or withdraw my consent to the use of telemedicine in the course of my care at any time, without affecting my right to future care or treatment, and that the Practitioner or I may terminate the telemedicine visit at any time. I understand that I have the right to inspect all information obtained and/or recorded in the course of the telemedicine visit and may receive copies of available information for a reasonable fee.  I understand that some of the potential risks of receiving the Services via telemedicine include:  Marland Kitchen Delay or interruption in medical evaluation due to technological equipment failure or disruption; . Information transmitted may not be sufficient (e.g. poor resolution of images) to allow for appropriate medical decision making by the Practitioner; and/or  . In rare instances, security protocols  could fail, causing a breach of personal health information.  Furthermore, I acknowledge that it is my responsibility to provide information about my medical history, conditions and care that is complete and accurate to the best of my ability. I acknowledge that Practitioner's advice, recommendations, and/or decision may be based on factors not within their control, such as incomplete or inaccurate data provided by me or distortions of diagnostic images or specimens that may result from electronic transmissions. I understand that the practice of medicine is not an exact science and that Practitioner makes no warranties or guarantees regarding treatment outcomes. I acknowledge that I will receive a copy of this consent concurrently upon execution via email to the email address I last provided but may also request a printed copy by calling the office of [facility].    I understand that my insurance will be billed for this visit.   I have read or had this consent read to me. . I understand the contents of this consent, which adequately explains the benefits and risks of the Services being provided via telemedicine.  . I have been provided ample opportunity to ask questions regarding this consent and the Services and have had my questions  answered to my satisfaction. . I give my informed consent for the services to be provided through the use of telemedicine in my medical care  By participating in this telemedicine visit I agree to the above.

## 2019-02-14 ENCOUNTER — Telehealth: Payer: Self-pay

## 2019-02-14 NOTE — Telephone Encounter (Signed)
LEft vm for pts husband visit will be change to video because of COVID 19. I stated if he has a I phone the MD can facetime him and pt. If he has any other phone it still can be done. I need email for husband and he will need to download [email or web address] on his phone or device that has a camera.

## 2019-02-15 ENCOUNTER — Telehealth: Payer: Self-pay | Admitting: Cardiovascular Disease

## 2019-02-15 NOTE — Telephone Encounter (Signed)
Mychart, smartphone, pre reg complete 02/15/19 AF °

## 2019-02-16 ENCOUNTER — Encounter: Payer: Self-pay | Admitting: Cardiovascular Disease

## 2019-02-16 ENCOUNTER — Telehealth (INDEPENDENT_AMBULATORY_CARE_PROVIDER_SITE_OTHER): Payer: Medicare Other | Admitting: Cardiovascular Disease

## 2019-02-16 VITALS — BP 133/93 | HR 94

## 2019-02-16 DIAGNOSIS — E785 Hyperlipidemia, unspecified: Secondary | ICD-10-CM | POA: Diagnosis not present

## 2019-02-16 DIAGNOSIS — Z7901 Long term (current) use of anticoagulants: Secondary | ICD-10-CM

## 2019-02-16 DIAGNOSIS — I10 Essential (primary) hypertension: Secondary | ICD-10-CM | POA: Diagnosis not present

## 2019-02-16 DIAGNOSIS — I482 Chronic atrial fibrillation, unspecified: Secondary | ICD-10-CM

## 2019-02-16 DIAGNOSIS — I5032 Chronic diastolic (congestive) heart failure: Secondary | ICD-10-CM | POA: Diagnosis not present

## 2019-02-16 DIAGNOSIS — I4821 Permanent atrial fibrillation: Secondary | ICD-10-CM

## 2019-02-16 DIAGNOSIS — I639 Cerebral infarction, unspecified: Secondary | ICD-10-CM

## 2019-02-16 DIAGNOSIS — E7800 Pure hypercholesterolemia, unspecified: Secondary | ICD-10-CM

## 2019-02-16 DIAGNOSIS — Z5181 Encounter for therapeutic drug level monitoring: Secondary | ICD-10-CM

## 2019-02-16 MED ORDER — AMLODIPINE BESYLATE 10 MG PO TABS: 10.0000 mg | ORAL_TABLET | Freq: Every day | ORAL | 3 refills | Status: DC

## 2019-02-16 MED ORDER — ATORVASTATIN CALCIUM 20 MG PO TABS: 20.0000 mg | ORAL_TABLET | Freq: Every day | ORAL | 3 refills | Status: DC

## 2019-02-16 NOTE — Progress Notes (Signed)
Virtual Visit via Video Note   This visit type was conducted due to national recommendations for restrictions regarding the COVID-19 Pandemic (e.g. social distancing) in an effort to limit this patient's exposure and mitigate transmission in our community.  Due to her co-morbid illnesses, this patient is at least at moderate risk for complications without adequate follow up.  This format is felt to be most appropriate for this patient at this time.  All issues noted in this document were discussed and addressed.  A limited physical exam was performed with this format.  Please refer to the patient's chart for her consent to telehealth for [facility].   Evaluation Performed:  Follow-up visit  Date:  02/17/2019   ID:  [name], DOB [birth year], MRN [id number]  Patient Location: Home Provider Location: Office  PCP:  Gaspar Garbe, MD  Cardiologist:  Chilton Si, MD  Electrophysiologist:  None   Chief Complaint:  Follow up, hypertension  History of Present Illness:    [name] is an 85 y.o. female with chronic diastolic heart failure, persistent atrial fibrillation, stroke on warfarin, moderate L subclavian stenosis, hyperlipidemia and hypothyroidism who presents for follow up.  She was previously a patient of Dr. Patty Sermons.  She had a [employer] 08/2015. She was noted to be in atrial fibrillation, but there is no evidence of ischemia.  Echocardiogram 01/2012 revealed LVEF 50-55%.  [name] had a thoracic  vertebral compression fracture and had to undergo kyphoplasty.   Warfarin was held and she was bridged with Lovenox.  This was complicated by a paraspinal hematoma.  She suffered from another vertebral fracture and struggles with pain in her back.  She is not a candidate for any further interventions.  She has been working with pain management and takes oxycodone at night to sleep. She also broken her left wrist right shoulder and just  finished physical therapy.   [name] has been struggling with back pain.  She underwent a back procedure that sounds like a kyphoplasty and felt well for about 1 week.  However she then developed problems with the vertebrae above.  She saw a different orthopedic surgeon for second opinion and he recommended that she not have any further procedures but instead proceed with pain management.  She has been seeing a pain management specialist who recommended that she have an injection.  Given her history of bleeding on Lovenox a decision was made not to bridge her.  She held warfarin for 2 days.  On the second day she developed acute confusion that lasted for several hours.  She started shouting at her husband and asking, "where is my staff?".  She was unable to tell her daughter her name.  Her husband reports that she was leaning to one side and nearly fell.  The family elected to resume her warfarin and the symptoms resolved by the following day.    [name] was admitted 09/2018 with a L MCA stroke.  INR was therapeutic on admission.  IR attempted revascularization unsuccessfully.  Echo that admission revealed LVEF 55 to 60% with akinesis of the basal and mid anteroseptal myocardium.  Warfarin was replaced with Eliquis.    She has residual speech difficulties and weakness.  At her last appointment her blood pressure was poorly controlled so amlodipine was increased. Her BP has been running in the 130s-150s.  She has been feeling well but complains of back and leg pain. She complains of  pain in her back that makes it hard to sleep.   She has no lower extremity edmea but does occasionally have orthopnea.  She denies PND.  Her physical and speech therapy have been on hold due to COVD-19.   The patient does not have symptoms concerning for COVID-19 infection (fever, chills, cough, or new shortness of breath).    Past Medical History:  Diagnosis Date  . Atrial fibrillation (HCC)   .  Chronic anticoagulation   . Chronic anticoagulation   . CVA (cerebrovascular accident) (HCC)   . Diabetes mellitus    pt & husband deny any knowledge of this   . History of chicken pox   . Hyperlipidemia   . Hypertension    Past Surgical History:  Procedure Laterality Date  . APPENDECTOMY    . BACK SURGERY    . CARDIAC CATHETERIZATION  11/09/91   EF 70%  . DILATION AND CURETTAGE OF UTERUS    . IR PERCUTANEOUS ART THROMBECTOMY/INFUSION INTRACRANIAL INC DIAG ANGIO  10/03/2018  . RADIOLOGY WITH ANESTHESIA N/A 10/03/2018   Procedure: IR WITH ANESTHESIA;  Surgeon: Radiologist, Medication, MD;  Location: [facility] OR;  Service: Radiology;  Laterality: N/A;     Current Meds  Medication Sig  . acetaminophen (TYLENOL) 325 MG tablet Take 2 tablets (650 mg total) by mouth every 4 (four) hours as needed for mild pain (or temp > 37.5 C (99.5 F)).  Marland Kitchen. amLODipine (NORVASC) 10 MG tablet Take 1 tablet (10 mg total) by mouth daily.  Marland Kitchen. apixaban (ELIQUIS) 2.5 MG TABS tablet Take 1 tablet (2.5 mg total) by mouth 2 (two) times daily.  Marland Kitchen. atorvastatin (LIPITOR) 20 MG tablet Take 1 tablet (20 mg total) by mouth daily.  Marland Kitchen. levothyroxine (SYNTHROID, LEVOTHROID) 25 MCG tablet Take 1 tablet (25 mcg total) by mouth daily before breakfast.  . nadolol (CORGARD) 20 MG tablet Take 0.5 tablets (10 mg total) by mouth daily.  . nitroGLYCERIN (NITROSTAT) 0.4 MG SL tablet Place 0.4 mg under the tongue every 5 (five) minutes as needed (chest pain).   . [DISCONTINUED] amLODipine (NORVASC) 5 MG tablet Take 1 tablet (5 mg total) by mouth daily.  . [DISCONTINUED] atorvastatin (LIPITOR) 20 MG tablet Take 0.5 tablets (10 mg total) by mouth daily.     Allergies:   Cozaar; Hydralazine; Hyzaar [losartan potassium-hctz]; Sulfa drugs cross reactors; and Lisinopril   Social History   Tobacco Use  . Smoking status: Former Smoker    Last attempt to quit: 10/19/1998    Years since quitting: 20.3  . Smokeless tobacco: Never Used  Substance  Use Topics  . Alcohol use: No  . Drug use: No     Family Hx: The patient's family history includes Heart disease in her father and mother; Heart failure in her brother; Hypertension in her mother; Stroke in her mother. There is no history of Osteoporosis.  ROS:   Please see the history of present illness.     All other systems reviewed and are negative.   Prior CV studies:   The following studies were reviewed today:  Echo 10/04/18: Study Conclusions  - Left ventricle: The cavity size was normal. There was mild concentric hypertrophy. Systolic function was normal. The estimated ejection fraction was in the range of 55% to 60%. Akinesis of the basal and mid anteroseptal walls. - Aortic valve: There was mild regurgitation. - Mitral valve: There was mild regurgitation. - Left atrium: The atrium was mildly dilated. - Right ventricle: The cavity size was normal.  Wall thickness was normal. Systolic function was normal. - Tricuspid valve: There was mild regurgitation. - Pulmonic valve: There was no regurgitation. - Pulmonary arteries: Systolic pressure was within the normal range. - Inferior vena cava: The vessel was normal in size. - Pericardium, extracardiac: There was no pericardial effusion.   Labs/Other Tests and Data Reviewed:    EKG:  No ECG reviewed.  Recent Labs: 10/07/2018: ALT 16; BUN 11; Creatinine, Ser 1.42; Hemoglobin 10.0; Platelets 273; Potassium 3.5; Sodium 137   Recent Lipid Panel Lab Results  Component Value Date/Time   CHOL 103 10/04/2018 05:10 AM   TRIG 88 10/04/2018 05:10 AM   HDL 26 (L) 10/04/2018 05:10 AM   CHOLHDL 4.0 10/04/2018 05:10 AM   LDLCALC 59 10/04/2018 05:10 AM   LDLCALC 78 10/13/2017 04:58 PM    Wt Readings from Last 3 Encounters:  11/17/18 113 lb (51.3 kg)  11/08/18 112 lb 3.2 oz (50.9 kg)  10/06/18 119 lb 4.3 oz (54.1 kg)     Objective:    BP (!) 133/93   Pulse 94  GENERAL: Well-appearing.  No acute distress.  HEENT: Pupils equal round.  Oral mucosa unremarkable NECK:  No jugular venous distention, no visible thyromegaly EXT:  No edema, no cyanosis no clubbing SKIN:  No rashes no nodules NEURO:  Speech slurred.  Cranial nerves grossly intact.  Moves all 4 extremities freely PSYCH:  Cognitively intact, oriented to person place and time   ASSESSMENT & PLAN:    # Persistent atrial fibrillation/flutter::  Rates remain well-controlled.  She is asymptomatic.  She had a stroke on warfarin despite a therapeutic INR. Continue Eliquis and nadolol.  # CVA: She will resume speech therapy after COVID-19.  Continue follow up with Neurology.   # Hypertension:  Blood pressure is poorly controlled.  Increase amlodipine to 10 mg.  HR is low so we cannot increase nadolol.  # Hyperlipidemia: Most recent LDL was 85.  We will increase atorvastatin to .  Repeat lipids/CP in 3 months.  Goal LDL is <70.   COVID-19 Education: The signs and symptoms of COVID-19 were discussed with the patient and how to seek care for testing (follow up with PCP or arrange E-visit).  The importance of social distancing was discussed today.  Time:   Today, I have spent 20 minutes with the patient with telehealth technology discussing the above problems.     Medication Adjustments/Labs and Tests Ordered: Current medicines are reviewed at length with the patient today.  Concerns regarding medicines are outlined above.   Tests Ordered: Orders Placed This Encounter  Procedures  . Lipid panel  . Comprehensive metabolic panel    Medication Changes: Meds ordered this encounter  Medications  . amLODipine (NORVASC) 10 MG tablet    Sig: Take 1 tablet (10 mg total) by mouth daily.    Dispense:  90 tablet    Refill:  3    NEW DOSE, D/C PREVIOUS RX PATIENT WILL CALL WHEN NEEDS FILLED  . atorvastatin (LIPITOR) 20 MG tablet    Sig: Take 1 tablet (20 mg total) by mouth daily.    Dispense:  90 tablet    Refill:  3    NEW DOSE,  D/C PREVIOUS RX PATIENT WILL CALL WHEN NEEDS FILLED    Disposition:  Follow up with APP in 1 month.  Follow up with Shabreka Coulon C. Duke Salvia, MD, [facility] in 4 months.   Signed, Chilton Si, MD  02/17/2019 3:28 PM    [facility]  

## 2019-02-16 NOTE — Telephone Encounter (Signed)
Left second vm about visit will be change to video. Pt is new to Dr. Pearlean Brownie as a follow up visit. If husband or pt has a IPhone he can facetime them. If pt or husband knows how to text from cell phone, we can text them a link for video on doxy. We need to get verbal consent and to file insurance.

## 2019-02-16 NOTE — Patient Instructions (Addendum)
Medication Instructions:  INCREASE AMLODIPINE TO 10 MG DAILY   INCREASE ATORVASTATIN TO 20 MG DAILY   If you need a refill on your cardiac medications before your next appointment, please call your pharmacy.   Lab work: FASTING LP/CMET IN 3 MONTHS  If you have labs (blood work) drawn today and your tests are completely normal, you will receive your results only by: Marland Kitchen MyChart Message (if you have MyChart) OR . A paper copy in the mail If you have any lab test that is abnormal or we need to change your treatment, we will call you to review the results.  Testing/Procedures: NONE  Follow-Up: Your physician recommends that you schedule a follow-up appointment in: 1 month with Franky Macho K PA 03/20/19 at 2:00  Your physician wants you to follow-up in: 4 months with Dr Fara Chute will receive a reminder letter in the mail two months in advance. If you don't receive a letter, please call our office to schedule the follow-up appointment.

## 2019-02-17 NOTE — Telephone Encounter (Signed)
Pts husband called and stated she isnt going to do the appt and he will let us know when she will

## 2019-02-20 ENCOUNTER — Ambulatory Visit: Payer: Medicare Other | Admitting: Neurology

## 2019-02-20 NOTE — Telephone Encounter (Signed)
Ok thanks 

## 2019-02-21 NOTE — Telephone Encounter (Signed)
Patient has declined Prolia and was archived in the portal

## 2019-03-01 DIAGNOSIS — Z5181 Encounter for therapeutic drug level monitoring: Secondary | ICD-10-CM | POA: Diagnosis not present

## 2019-03-01 DIAGNOSIS — E7800 Pure hypercholesterolemia, unspecified: Secondary | ICD-10-CM | POA: Diagnosis not present

## 2019-03-01 DIAGNOSIS — I10 Essential (primary) hypertension: Secondary | ICD-10-CM | POA: Diagnosis not present

## 2019-03-02 LAB — COMPREHENSIVE METABOLIC PANEL
ALT: 20 IU/L (ref 0–32)
AST: 23 IU/L (ref 0–40)
Albumin/Globulin Ratio: 1.7 (ref 1.2–2.2)
Albumin: 4.4 g/dL (ref 3.6–4.6)
Alkaline Phosphatase: 74 IU/L (ref 39–117)
BUN/Creatinine Ratio: 17 (ref 12–28)
BUN: 24 mg/dL (ref 8–27)
Bilirubin Total: 0.7 mg/dL (ref 0.0–1.2)
CO2: 22 mmol/L (ref 20–29)
Calcium: 9.7 mg/dL (ref 8.7–10.3)
Chloride: 98 mmol/L (ref 96–106)
Creatinine, Ser: 1.42 mg/dL — ABNORMAL HIGH (ref 0.57–1.00)
GFR calc Af Amer: 38 mL/min/{1.73_m2} — ABNORMAL LOW (ref >59)
GFR calc non Af Amer: 33 mL/min/{1.73_m2} — ABNORMAL LOW (ref >59)
Globulin, Total: 2.6 g/dL (ref 1.5–4.5)
Glucose: 109 mg/dL — ABNORMAL HIGH (ref 65–99)
Potassium: 4.9 mmol/L (ref 3.5–5.2)
Sodium: 136 mmol/L (ref 134–144)
Total Protein: 7 g/dL (ref 6.0–8.5)

## 2019-03-02 LAB — LIPID PANEL
Chol/HDL Ratio: 2.2 ratio (ref 0.0–4.4)
Cholesterol, Total: 166 mg/dL (ref 100–199)
HDL: 76 mg/dL (ref >39)
LDL Calculated: 74 mg/dL (ref 0–99)
Triglycerides: 80 mg/dL (ref 0–149)
VLDL Cholesterol Cal: 16 mg/dL (ref 5–40)

## 2019-03-10 ENCOUNTER — Encounter: Payer: Medicare Other | Attending: Physical Medicine & Rehabilitation | Admitting: Physical Medicine & Rehabilitation

## 2019-03-10 ENCOUNTER — Other Ambulatory Visit: Payer: Self-pay

## 2019-03-10 ENCOUNTER — Encounter: Payer: Self-pay | Admitting: Physical Medicine & Rehabilitation

## 2019-03-10 VITALS — BP 149/80 | Temp 96.7°F | Ht 59.0 in | Wt 120.0 lb

## 2019-03-10 DIAGNOSIS — I69320 Aphasia following cerebral infarction: Secondary | ICD-10-CM

## 2019-03-10 DIAGNOSIS — I63512 Cerebral infarction due to unspecified occlusion or stenosis of left middle cerebral artery: Secondary | ICD-10-CM | POA: Diagnosis not present

## 2019-03-10 DIAGNOSIS — I69390 Apraxia following cerebral infarction: Secondary | ICD-10-CM | POA: Diagnosis not present

## 2019-03-10 NOTE — Progress Notes (Signed)
Subjective:  Consent for phone visit, husband assisting due to aphasia  Patient ID: [name], female    DOB: 19-May-1930, 85 y.o.   MRN: [id number] 85 year old right-handed female with history of hypertension, diet-controlled diabetes, CVA 10 years ago, chronic anticoagulation secondary to atrial fibrillation. Lives with her spouse. Used a cane prior to admission.  Presented 10/03/2018 with sudden onset of right-sided weakness and aphasia. INR on admission of 2.52. Cranial CT scan unremarkable for acute process. CT angiogram of head and neck showed left M2 branch occlusion. MRI infarction, left MCA territory.  No hemorrhage. Interventional radiology attempted revascularization unsuccessful. Echocardiogram with ejection fraction of 60%. Systolic function normal.  HPI Remains severely aphasic Needs assist with dressing and bathing, according to husband helps her "a little"  Walks with walker in the home with SBA  RIght side hand and leg pain   RIght hand pain is constant, but not severe (~3/10) Right knee pain is mainly with sitting   No medications or ice have been tried  Pt wishes to resume therapy  Pain Inventory Average Pain 3 Pain Right Now 4 My pain is hand pain is constant and leg pain is intermittent  In the last 24 hours, has pain interfered with the following? General activity 0 Relation with others 0 Enjoyment of life 0 What TIME of day is your pain at its worst? hand is all the time Sleep (in general) Good  Pain is worse with: knee pain with sitting  Pain improves with: has not tried anything Relief from Meds: has not tried   Mobility use a walker ability to climb steps?  no do you drive?  no use a wheelchair needs help with transfers  Function retired  Neuro/Psych trouble walking  Prior Studies Any changes since last visit?  no  Physicians involved in your care Any changes since last visit?  no   Family History  Problem  Relation Age of Onset  . Heart disease Mother   . Hypertension Mother   . Stroke Mother   . Heart disease Father   . Heart failure Brother   . Osteoporosis Neg Hx    Social History   Socioeconomic History  . Marital status: Married    Spouse name: Not on file  . Number of children: 2  . Years of education: Not on file  . Highest education level: High school graduate  Occupational History  . Not on file  Social Needs  . Financial resource strain: Not on file  . Food insecurity:    Worry: Not on file    Inability: Not on file  . Transportation needs:    Medical: Not on file    Non-medical: Not on file  Tobacco Use  . Smoking status: Former Smoker    Last attempt to quit: 10/19/1998    Years since quitting: 20.4  . Smokeless tobacco: Never Used  Substance and Sexual Activity  . Alcohol use: No  . Drug use: No  . Sexual activity: Not on file  Lifestyle  . Physical activity:    Days per week: Not on file    Minutes per session: Not on file  . Stress: Not on file  Relationships  . Social connections:    Talks on phone: Not on file    Gets together: Not on file    Attends religious service: Not on file    Active member of club or organization: Not on file    Attends meetings of clubs  or organizations: Not on file    Relationship status: Not on file  Other Topics Concern  . Not on file  Social History Narrative   Lives at home with her husband    Right handed   Caffeine: sometimes coffee   Past Surgical History:  Procedure Laterality Date  . APPENDECTOMY    . BACK SURGERY    . CARDIAC CATHETERIZATION  11/09/91   EF 70%  . DILATION AND CURETTAGE OF UTERUS    . IR PERCUTANEOUS ART THROMBECTOMY/INFUSION INTRACRANIAL INC DIAG ANGIO  10/03/2018  . RADIOLOGY WITH ANESTHESIA N/A 10/03/2018   Procedure: IR WITH ANESTHESIA;  Surgeon: Radiologist, Medication, MD;  Location: [facility] OR;  Service: Radiology;  Laterality: N/A;   Past Medical History:  Diagnosis Date  . Atrial  fibrillation (HCC)   . Chronic anticoagulation   . Chronic anticoagulation   . CVA (cerebrovascular accident) (HCC)   . Diabetes mellitus    pt & husband deny any knowledge of this   . History of chicken pox   . Hyperlipidemia   . Hypertension    BP (!) 149/80   Temp (!) 96.7 F (35.9 C)   Ht 4\' 11"  (1.499 m)   Wt 120 lb (54.4 kg)   BMI 24.24 kg/m   Opioid Risk Score:   Fall Risk Score:  `1  Depression screen PHQ 2/9  Depression screen [facility] 2/9 11/08/2018 10/13/2017 09/24/2016 04/29/2016  Decreased Interest 0 0 0 0  Down, Depressed, Hopeless 1 0 0 0  PHQ - 2 Score 1 0 0 0  Altered sleeping 2 - 0 -  Tired, decreased energy 2 - 0 -  Change in appetite 0 - 0 -  Feeling bad or failure about yourself  3 - 0 -  Trouble concentrating 0 - 0 -  Moving slowly or fidgety/restless 3 - 0 -  Suicidal thoughts 0 - 0 -  PHQ-9 Score 11 - 0 -  Difficult doing work/chores Very difficult - - -  Some recent data might be hidden     Review of Systems  Constitutional: Negative.   HENT: Negative.   Eyes: Negative.   Respiratory: Negative.   Cardiovascular: Negative.   Gastrointestinal: Negative.   Endocrine: Negative.   Genitourinary: Negative.   Musculoskeletal: Positive for gait problem.  Skin: Negative.   Allergic/Immunologic: Negative.   Hematological: Negative.   Psychiatric/Behavioral: Negative.   All other systems reviewed and are negative.      Objective:   Physical Exam  Pt aphasic and dysarthric Appears accurate with Y/N for simple questions No lability or agitation noted Exam limited by phone visit format     Assessment & Plan:  1.  Left MCA infarct onset ~49mo ago, expect further improvement with speech and language, cont OP SLP  2. Right hand pain, ? hypersensitivity or tone related, will ask OT to re eval  I will schedule pt in office next month for evaluation   Duration of call 14 min

## 2019-03-10 NOTE — Patient Instructions (Signed)
Gabapentin capsules or tablets What is this medicine? GABAPENTIN (GA ba pen tin) is used to control seizures in certain types of epilepsy. It is also used to treat certain types of nerve pain. This medicine may be used for other purposes; ask your health care provider or pharmacist if you have questions. COMMON BRAND NAME(S): Active-PAC with Gabapentin, Gabarone, Neurontin What should I tell my health care provider before I take this medicine? They need to know if you have any of these conditions: -kidney disease -suicidal thoughts, plans, or attempt; a previous suicide attempt by you or a family member -an unusual or allergic reaction to gabapentin, other medicines, foods, dyes, or preservatives -pregnant or trying to get pregnant -breast-feeding How should I use this medicine? Take this medicine by mouth with a glass of water. Follow the directions on the prescription label. You can take it with or without food. If it upsets your stomach, take it with food. Take your medicine at regular intervals. Do not take it more often than directed. Do not stop taking except on your doctor's advice. If you are directed to break the 600 or 800 mg tablets in half as part of your dose, the extra half tablet should be used for the next dose. If you have not used the extra half tablet within 28 days, it should be thrown away. A special MedGuide will be given to you by the pharmacist with each prescription and refill. Be sure to read this information carefully each time. Talk to your pediatrician regarding the use of this medicine in children. While this drug may be prescribed for children as young as 3 years for selected conditions, precautions do apply. Overdosage: If you think you have taken too much of this medicine contact a poison control center or emergency room at once. NOTE: This medicine is only for you. Do not share this medicine with others. What if I miss a dose? If you miss a dose, take it as soon  as you can. If it is almost time for your next dose, take only that dose. Do not take double or extra doses. What may interact with this medicine? Do not take this medicine with any of the following medications: -other gabapentin products This medicine may also interact with the following medications: -alcohol -antacids -antihistamines for allergy, cough and cold -certain medicines for anxiety or sleep -certain medicines for depression or psychotic disturbances -homatropine; hydrocodone -naproxen -narcotic medicines (opiates) for pain -phenothiazines like chlorpromazine, mesoridazine, prochlorperazine, thioridazine This list may not describe all possible interactions. Give your health care provider a list of all the medicines, herbs, non-prescription drugs, or dietary supplements you use. Also tell them if you smoke, drink alcohol, or use illegal drugs. Some items may interact with your medicine. What should I watch for while using this medicine? Visit your doctor or health care professional for regular checks on your progress. You may want to keep a record at home of how you feel your condition is responding to treatment. You may want to share this information with your doctor or health care professional at each visit. You should contact your doctor or health care professional if your seizures get worse or if you have any new types of seizures. Do not stop taking this medicine or any of your seizure medicines unless instructed by your doctor or health care professional. Stopping your medicine suddenly can increase your seizures or their severity. Wear a medical identification bracelet or chain if you are taking this medicine for   seizures, and carry a card that lists all your medications. You may get drowsy, dizzy, or have blurred vision. Do not drive, use machinery, or do anything that needs mental alertness until you know how this medicine affects you. To reduce dizzy or fainting spells, do not  sit or stand up quickly, especially if you are an older patient. Alcohol can increase drowsiness and dizziness. Avoid alcoholic drinks. Your mouth may get dry. Chewing sugarless gum or sucking hard candy, and drinking plenty of water will help. The use of this medicine may increase the chance of suicidal thoughts or actions. Pay special attention to how you are responding while on this medicine. Any worsening of mood, or thoughts of suicide or dying should be reported to your health care professional right away. Women who become pregnant while using this medicine may enroll in the [place] American Antiepileptic Drug Pregnancy Registry by calling 1-888-233-2334. This registry collects information about the safety of antiepileptic drug use during pregnancy. What side effects may I notice from receiving this medicine? Side effects that you should report to your doctor or health care professional as soon as possible: -allergic reactions like skin rash, itching or hives, swelling of the face, lips, or tongue -worsening of mood, thoughts or actions of suicide or dying Side effects that usually do not require medical attention (report to your doctor or health care professional if they continue or are bothersome): -constipation -difficulty walking or controlling muscle movements -dizziness -nausea -slurred speech -tiredness -tremors -weight gain This list may not describe all possible side effects. Call your doctor for medical advice about side effects. You may report side effects to FDA at 1-800-FDA-1088. Where should I keep my medicine? Keep out of reach of children. This medicine may cause accidental overdose and death if it taken by other adults, children, or pets. Mix any unused medicine with a substance like cat litter or coffee grounds. Then throw the medicine away in a sealed container like a sealed bag or a coffee can with a lid. Do not use the medicine after the expiration date. Store at room  temperature between 15 and 30 degrees C (59 and 86 degrees F). NOTE: This sheet is a summary. It may not cover all possible information. If you have questions about this medicine, talk to your doctor, pharmacist, or health care provider.  2019 Elsevier/Gold Standard (2018-03-10 13:21:44)  

## 2019-03-14 ENCOUNTER — Telehealth: Payer: Self-pay | Admitting: *Deleted

## 2019-03-14 DIAGNOSIS — I69359 Hemiplegia and hemiparesis following cerebral infarction affecting unspecified side: Secondary | ICD-10-CM

## 2019-03-14 DIAGNOSIS — I63512 Cerebral infarction due to unspecified occlusion or stenosis of left middle cerebral artery: Secondary | ICD-10-CM

## 2019-03-14 NOTE — Telephone Encounter (Signed)
Order placed

## 2019-03-14 NOTE — Telephone Encounter (Signed)
-----   Message from Erick Colace, MD sent at 03/14/2019 12:39 PM EDT ----- Regarding: FW: New OT order Please place order ----- Message ----- From: Collier Salina, OT Sent: 03/14/2019   8:58 AM EDT To: Erick Colace, MD Subject: New OT order                                   Dr Kirtland Bouchard, I saw your recent note for [facility] regarding hand pain.   She had been discharged from OT at husband's request due to inability to participate (significant back pain) She is a COVID6 - but really wouldn't be a good telehealth candidate.   We could try to see her again in the clinic to address her hand pain / function.   If you agree, please send a new OT order. Thanks! Bretta Bang, OTR/L 03/14/19 9:02 AM Phone: [phone] Fax: [phone]

## 2019-03-15 ENCOUNTER — Telehealth: Payer: Self-pay | Admitting: Cardiology

## 2019-03-15 NOTE — Telephone Encounter (Signed)
Mychart, smartphone, consent, pre reg complete 03/15/19 AF

## 2019-03-20 ENCOUNTER — Telehealth: Payer: Self-pay

## 2019-03-20 ENCOUNTER — Encounter: Payer: Self-pay | Admitting: Cardiology

## 2019-03-20 ENCOUNTER — Telehealth (INDEPENDENT_AMBULATORY_CARE_PROVIDER_SITE_OTHER): Payer: Medicare Other | Admitting: Cardiology

## 2019-03-20 VITALS — BP 122/68 | HR 66 | Temp 98.1°F | Ht 64.0 in | Wt 113.0 lb

## 2019-03-20 DIAGNOSIS — R05 Cough: Secondary | ICD-10-CM | POA: Diagnosis not present

## 2019-03-20 DIAGNOSIS — R059 Cough, unspecified: Secondary | ICD-10-CM

## 2019-03-20 DIAGNOSIS — Z8673 Personal history of transient ischemic attack (TIA), and cerebral infarction without residual deficits: Secondary | ICD-10-CM | POA: Diagnosis not present

## 2019-03-20 DIAGNOSIS — I10 Essential (primary) hypertension: Secondary | ICD-10-CM | POA: Diagnosis not present

## 2019-03-20 DIAGNOSIS — E7800 Pure hypercholesterolemia, unspecified: Secondary | ICD-10-CM | POA: Diagnosis not present

## 2019-03-20 DIAGNOSIS — I4819 Other persistent atrial fibrillation: Secondary | ICD-10-CM | POA: Diagnosis not present

## 2019-03-20 MED ORDER — PANTOPRAZOLE SODIUM 40 MG PO TBEC: 40.0000 mg | DELAYED_RELEASE_TABLET | Freq: Every day | ORAL | 2 refills | Status: AC

## 2019-03-20 NOTE — Telephone Encounter (Signed)
Opened in error

## 2019-03-20 NOTE — Telephone Encounter (Signed)
Contacted patient and spoke with her husband, per DPR,  to discuss AVS instructions. Patient's spouse was agreeable with Luke's recommendations and voiced understanding. AVS will be mailed to patient by medical records.

## 2019-03-20 NOTE — Addendum Note (Signed)
Addended by: Kandice Robinsons T on: 03/20/2019 02:42 PM   Modules accepted: Orders

## 2019-03-20 NOTE — Telephone Encounter (Signed)
Virtual Visit Pre-Appointment Phone Call  "[name], I am calling you today to discuss your upcoming appointment. We are currently trying to limit exposure to the virus that causes COVID-19 by seeing patients at home rather than in the office."  1. "What is the BEST phone number to call the day of the visit?" - include this in appointment notes  2. "Do you have or have access to (through a family member/friend) a smartphone with video capability that we can use for your visit?" a. If yes - list this number in appt notes as "cell" (if different from BEST phone #) and list the appointment type as a VIDEO visit in appointment notes b. If no - list the appointment type as a PHONE visit in appointment notes  3. Confirm consent - "In the setting of the current Covid19 crisis, you are scheduled for a (phone or video) visit with your provider on (date) at (time).  Just as we do with many in-office visits, in order for you to participate in this visit, we must obtain consent.  If you'd like, I can send this to your mychart (if signed up) or email for you to review.  Otherwise, I can obtain your verbal consent now.  All virtual visits are billed to your insurance company just like a normal visit would be.  By agreeing to a virtual visit, we'd like you to understand that the technology does not allow for your provider to perform an examination, and thus may limit your provider's ability to fully assess your condition. If your provider identifies any concerns that need to be evaluated in person, we will make arrangements to do so.  Finally, though the technology is pretty good, we cannot assure that it will always work on either your or our end, and in the setting of a video visit, we may have to convert it to a phone-only visit.  In either situation, we cannot ensure that we have a secure connection.  Are you willing to proceed?" STAFF: Did the patient verbally acknowledge consent to telehealth visit?  Document YES/NO here: YES  4. Advise patient to be prepared - "Two hours prior to your appointment, go ahead and check your blood pressure, pulse, oxygen saturation, and your weight (if you have the equipment to check those) and write them all down. When your visit starts, your provider will ask you for this information. If you have an Apple Watch or Kardia device, please plan to have heart rate information ready on the day of your appointment. Please have a pen and paper handy nearby the day of the visit as well."  5. Give patient instructions for MyChart download to smartphone OR Doximity/Doxy.me as below if video visit (depending on what platform provider is using)  6. Inform patient they will receive a phone call 15 minutes prior to their appointment time (may be from unknown caller ID) so they should be prepared to answer    TELEPHONE CALL NOTE  [name] Martinez has been deemed a candidate for a follow-up tele-health visit to limit community exposure during the Covid-19 pandemic. I spoke with the patient via phone to ensure availability of phone/video source, confirm preferred email & phone number, and discuss instructions and expectations.  I reminded [name] to be prepared with any vital sign and/or heart rhythm information that could potentially be obtained via home monitoring, at the time of her visit. I reminded [name] Martinez to expect a  phone call prior to her visit.  Lucita Ferrara, CMA 03/20/2019 8:44 AM   INSTRUCTIONS FOR DOWNLOADING THE MYCHART APP TO SMARTPHONE  - The patient must first make sure to have activated MyChart and know their login information - If Apple, go to [employer] and type in MyChart in the search bar and download the app. If Android, ask patient to go to [employer] and type in [place] in the search bar and download the app. The app is free but as with any other app downloads, their phone may require them to verify  saved payment information or Apple/Android password.  - The patient will need to then log into the app with their MyChart username and password, and select [place] as their healthcare provider to link the account. When it is time for your visit, go to the MyChart app, find appointments, and click Begin Video Visit. Be sure to Select Allow for your device to access the Microphone and Camera for your visit. You will then be connected, and your provider will be with you shortly.  **If they have any issues connecting, or need assistance please contact MyChart service desk [phone])**  **If using a computer, in order to ensure the best quality for their visit they will need to use either of the following Internet Browsers: [employer] or Google Chrome**  IF USING DOXIMITY or DOXY.ME - The patient will receive a link just prior to their visit by text.     FULL LENGTH CONSENT FOR TELE-HEALTH VISIT   I hereby voluntarily request, consent and authorize [facility] and its employed or contracted physicians, physician assistants, nurse practitioners or other licensed health care professionals (the Practitioner), to provide me with telemedicine health care services (the "Services") as deemed necessary by the treating Practitioner. I acknowledge and consent to receive the Services by the Practitioner via telemedicine. I understand that the telemedicine visit will involve communicating with the Practitioner through live audiovisual communication technology and the disclosure of certain medical information by electronic transmission. I acknowledge that I have been given the opportunity to request an in-person assessment or other available alternative prior to the telemedicine visit and am voluntarily participating in the telemedicine visit.  I understand that I have the right to withhold or withdraw my consent to the use of telemedicine in the course of my care at any time, without  affecting my right to future care or treatment, and that the Practitioner or I may terminate the telemedicine visit at any time. I understand that I have the right to inspect all information obtained and/or recorded in the course of the telemedicine visit and may receive copies of available information for a reasonable fee.  I understand that some of the potential risks of receiving the Services via telemedicine include:  Marland Kitchen Delay or interruption in medical evaluation due to technological equipment failure or disruption; . Information transmitted may not be sufficient (e.g. poor resolution of images) to allow for appropriate medical decision making by the Practitioner; and/or  . In rare instances, security protocols could fail, causing a breach of personal health information.  Furthermore, I acknowledge that it is my responsibility to provide information about my medical history, conditions and care that is complete and accurate to the best of my ability. I acknowledge that Practitioner's advice, recommendations, and/or decision may be based on factors not within their control, such as incomplete or inaccurate data provided by me or distortions of diagnostic images or specimens that may  result from electronic transmissions. I understand that the practice of medicine is not an exact science and that Practitioner makes no warranties or guarantees regarding treatment outcomes. I acknowledge that I will receive a copy of this consent concurrently upon execution via email to the email address I last provided but may also request a printed copy by calling the office of [facility].    I understand that my insurance will be billed for this visit.   I have read or had this consent read to me. . I understand the contents of this consent, which adequately explains the benefits and risks of the Services being provided via telemedicine.  . I have been provided ample opportunity to ask questions regarding this  consent and the Services and have had my questions answered to my satisfaction. . I give my informed consent for the services to be provided through the use of telemedicine in my medical care  By participating in this telemedicine visit I agree to the above.

## 2019-03-20 NOTE — Patient Instructions (Addendum)
Medication Instructions:  START Protonix 40mg  Take 1 tablet once a day  If you need a refill on your cardiac medications before your next appointment, please call your pharmacy.   Lab work: None  If you have labs (blood work) drawn today and your tests are completely normal, you will receive your results only by: Marland Kitchen MyChart Message (if you have MyChart) OR . A paper copy in the mail If you have any lab test that is abnormal or we need to change your treatment, we will call you to review the results.  Testing/Procedures: none  Follow-Up: At [facility], you and your health needs are our priority.  As part of our continuing mission to provide you with exceptional heart care, we have created designated Provider Care Teams.  These Care Teams include your primary Cardiologist (physician) and Advanced Practice Providers (APPs -  Physician Assistants and Nurse Practitioners) who all work together to provide you with the care you need, when you need it. You will need a follow up appointment in 4-5 months.  Please call our office 2 months in advance to schedule this appointment.  You may see Chilton Si, MD or one of the following Advanced Practice Providers on your designated Care Team:   Corine Shelter, PA-C Judy Pimple, [place] . Marjie Skiff, PA-C  Any Other Special Instructions Will Be Listed Below (If Applicable).

## 2019-03-20 NOTE — Progress Notes (Signed)
Virtual Visit via Telephone Note   This visit type was conducted due to national recommendations for restrictions regarding the COVID-19 Pandemic (e.g. social distancing) in an effort to limit this patient's exposure and mitigate transmission in our community.  Due to her co-morbid illnesses, this patient is at least at moderate risk for complications without adequate follow up.  This format is felt to be most appropriate for this patient at this time.  The patient did not have access to video technology/had technical difficulties with video requiring transitioning to audio format only (telephone).  All issues noted in this document were discussed and addressed.  No physical exam could be performed with this format.  Please refer to the patient's chart for her  consent to telehealth for [facility].   Date:  03/20/2019   ID:  [name], DOB Feb 29, 1930, MRN [id number]  Patient Location: Home Provider Location: Home  PCP:  Tisovec, Adelfa Koh, MD  Cardiologist:  Chilton Si, MD  Electrophysiologist:  None   Evaluation Performed:  Follow-Up Visit  Chief Complaint:  cough  History of Present Illness:    [name] is a 85 y.o. female with a history of chronic atrial fibrillation, hypertension, dyslipidemia, chronic renal insufficiency stage III, and a left brain stroke in December 2019 while on Coumadin.  She also has chronic back pain from compression fractures and is followed at pain clinic.  Dr. Duke Salvia saw her in April 2020 and increased her amlodipine to 10 mg a day and her Lipitor to 20 mg a day.  She was contacted today for follow-up.  Since her stroke she has had a speech impediment.  Her husband "speaks for her".  She was present during the interview.  He says from a cardiac standpoint she is done well.  Her blood pressure is now in the 120s.  He says she has a persistent cough, it seems to be worse at night.  She is not having any trouble swallowing  or choking on her food.  She not having any usual dyspnea.  Her speech therapy had been placed on hold because of CO VID, that is to resume tomorrow.  The patient does not have symptoms concerning for COVID-19 infection (fever, chills, cough, or new shortness of breath).    Past Medical History:  Diagnosis Date  . Atrial fibrillation (HCC)   . Chronic anticoagulation   . Chronic anticoagulation   . CVA (cerebrovascular accident) (HCC)   . Diabetes mellitus    pt & husband deny any knowledge of this   . History of chicken pox   . Hyperlipidemia   . Hypertension    Past Surgical History:  Procedure Laterality Date  . APPENDECTOMY    . BACK SURGERY    . CARDIAC CATHETERIZATION  11/09/91   EF 70%  . DILATION AND CURETTAGE OF UTERUS    . IR PERCUTANEOUS ART THROMBECTOMY/INFUSION INTRACRANIAL INC DIAG ANGIO  10/03/2018  . RADIOLOGY WITH ANESTHESIA N/A 10/03/2018   Procedure: IR WITH ANESTHESIA;  Surgeon: Radiologist, Medication, MD;  Location: [facility] OR;  Service: Radiology;  Laterality: N/A;     Current Meds  Medication Sig  . acetaminophen (TYLENOL) 325 MG tablet Take 2 tablets (650 mg total) by mouth every 4 (four) hours as needed for mild pain (or temp > 37.5 C (99.5 F)).  Marland Kitchen amLODipine (NORVASC) 10 MG tablet Take 1 tablet (10 mg total) by mouth daily.  Marland Kitchen apixaban (ELIQUIS) 2.5 MG TABS tablet Take 1  tablet (2.5 mg total) by mouth 2 (two) times daily.  Marland Kitchen atorvastatin (LIPITOR) 20 MG tablet Take 1 tablet (20 mg total) by mouth daily.  Marland Kitchen levothyroxine (SYNTHROID, LEVOTHROID) 25 MCG tablet Take 1 tablet (25 mcg total) by mouth daily before breakfast.  . nadolol (CORGARD) 20 MG tablet Take 0.5 tablets (10 mg total) by mouth daily.  . nitroGLYCERIN (NITROSTAT) 0.4 MG SL tablet Place 0.4 mg under the tongue every 5 (five) minutes as needed (chest pain).      Allergies:   Cozaar; Hydralazine; Hyzaar [losartan potassium-hctz]; Sulfa drugs cross reactors; and Lisinopril   Social History    Tobacco Use  . Smoking status: Former Smoker    Last attempt to quit: 10/19/1998    Years since quitting: 20.4  . Smokeless tobacco: Never Used  Substance Use Topics  . Alcohol use: No  . Drug use: No     Family Hx: The patient's family history includes Heart disease in her father and mother; Heart failure in her brother; Hypertension in her mother; Stroke in her mother. There is no history of Osteoporosis.  ROS:   Please see the history of present illness.    All other systems reviewed and are negative.   Prior CV studies:   The following studies were reviewed today:   Labs/Other Tests and Data Reviewed:    EKG:  No ECG reviewed.  Recent Labs: 10/07/2018: Hemoglobin 10.0; Platelets 273 03/01/2019: ALT 20; BUN 24; Creatinine, Ser 1.42; Potassium 4.9; Sodium 136   Recent Lipid Panel Lab Results  Component Value Date/Time   CHOL 166 03/01/2019 02:56 PM   TRIG 80 03/01/2019 02:56 PM   HDL 76 03/01/2019 02:56 PM   CHOLHDL 2.2 03/01/2019 02:56 PM   CHOLHDL 4.0 10/04/2018 05:10 AM   LDLCALC 74 03/01/2019 02:56 PM   LDLCALC 78 10/13/2017 04:58 PM    Wt Readings from Last 3 Encounters:  03/20/19 113 lb (51.3 kg)  03/10/19 120 lb (54.4 kg)  11/17/18 113 lb (51.3 kg)     Objective:    Vital Signs:  BP 122/68   Pulse 66   Temp 98.1 F (36.7 C)   Ht 5\' 4"  (1.626 m)   Wt 113 lb (51.3 kg)   BMI 19.40 kg/m    VITAL SIGNS:  reviewed  ASSESSMENT & PLAN:    # Cough- Possibly secondary to GERD, add PPI.  # Persistent atrial fibrillation/flutter::  Rates remain well-controlled.  She is asymptomatic.  She had a stroke on warfarin despite a therapeutic INR. Continue Eliquis and nadolol.  #CVA: She will resume speech therapy tomorrow.  Continue follow up with Neurology.   # Hypertension: Blood pressure is better on increased Norvasc.  # Hyperlipidemia:   Repeat lipids/CP in 3 months.  Goal LDL is <70.  COVID-19 Education: The signs and symptoms of COVID-19 were  discussed with the patient and how to seek care for testing (follow up with PCP or arrange E-visit).  The importance of social distancing was discussed today.  Time:   Today, I have spent 15 minutes with the patient with telehealth technology discussing the above problems.     Medication Adjustments/Labs and Tests Ordered: Current medicines are reviewed at length with the patient today.  Concerns regarding medicines are outlined above.   Tests Ordered: No orders of the defined types were placed in this encounter.   Medication Changes: No orders of the defined types were placed in this encounter.   Disposition:  Follow up Dr Duke Salvia  in the Fall.  Refer to Dr Wylene Simmerisovec if her cough doesn't improve on PPI.   Signed, Corine ShelterLuke France Lusty, PA-C  03/20/2019 2:13 PM    [facility]

## 2019-03-21 ENCOUNTER — Ambulatory Visit: Payer: Medicare Other | Attending: Physical Medicine & Rehabilitation | Admitting: Speech Pathology

## 2019-03-21 ENCOUNTER — Other Ambulatory Visit: Payer: Self-pay

## 2019-03-21 DIAGNOSIS — R41841 Cognitive communication deficit: Secondary | ICD-10-CM | POA: Insufficient documentation

## 2019-03-21 DIAGNOSIS — R4701 Aphasia: Secondary | ICD-10-CM | POA: Insufficient documentation

## 2019-03-21 DIAGNOSIS — R05 Cough: Secondary | ICD-10-CM | POA: Insufficient documentation

## 2019-03-21 DIAGNOSIS — R059 Cough, unspecified: Secondary | ICD-10-CM | POA: Insufficient documentation

## 2019-03-21 NOTE — Therapy (Signed)
[facility] [address] [place], [place], [address] Phone: [phone]   Fax:  [phone]  Speech Language Pathology Treatment  Patient Details  Name: [name] MRN: [id number] Date of Birth: 11/16/1929 Referring Provider (SLP): Dr. Letta Pate   Encounter Date: 03/21/2019  End of Session - 03/21/19 1508    Visit Number  18    Number of Visits  26    Date for SLP Re-Evaluation  06/19/19    Authorization Type  MCR    Authorization Time Period  90 days    SLP Start Time  1352    SLP Stop Time   1450    SLP Time Calculation (min)  58 min    Activity Tolerance  Patient tolerated treatment well       Past Medical History:  Diagnosis Date  . Atrial fibrillation ([place])   . Chronic anticoagulation   . Chronic anticoagulation   . CVA (cerebrovascular accident) ([place])   . Diabetes mellitus    pt & husband deny any knowledge of this   . History of chicken pox   . Hyperlipidemia   . Hypertension     Past Surgical History:  Procedure Laterality Date  . APPENDECTOMY    . BACK SURGERY    . CARDIAC CATHETERIZATION  11/09/91   EF 70%  . DILATION AND CURETTAGE OF UTERUS    . IR PERCUTANEOUS ART THROMBECTOMY/INFUSION INTRACRANIAL INC DIAG ANGIO  10/03/2018  . RADIOLOGY WITH ANESTHESIA N/A 10/03/2018   Procedure: IR WITH ANESTHESIA;  Surgeon: Radiologist, Medication, MD;  Location: [facility];  Service: Radiology;  Laterality: N/A;    There were no vitals filed for this visit.  Subjective Assessment - 03/21/19 1453    Subjective  "That's what bothers me most is the (jibberish)" pt points to SLP name tag.    Patient is accompained by:  Family member   husband [name]   Currently in Pain?  No/denies      CLINIC OPERATION CHANGES: [facility] is operating at a low capacity due to COVID-19.  The patient was brought into the clinic for evaluation and/or treatment following universal masking  by staff, social distancing, and <10 people in the clinic.  The patient's COVID risk of complications score is 6.       ADULT SLP TREATMENT - 03/21/19 1454      General Information   Behavior/Cognition  Alert;Cooperative;Requires cueing   easily frustrated with husband     Treatment Provided   Treatment provided  Cognitive-Linquistic      Cognitive-Linquistic Treatment   Treatment focused on  Aphasia;Apraxia    Skilled Treatment  Pt pulled a list of family names from her purse; husband reports, "She knew all of these but woke up Friday and couldn't say them." Husband proceeded to cue pt: "What's your daughter's name?", continuing down the list. SLP educated re: aphasia and that confrontation not likely to yield positive result. SLP successfully cued pt for granddaughter-in-law's name by asking descriptive questions. Pt able to describe physical appearance and "She likes to cook," and then recalled name. SLP instructed husband to cue pt in this way by asking expansive questions. Husband receptive to this technique however he continued to use confrontational-type cues throughout remainder of session. SLP affirmed spouse's attempts to help pt, and demonstrated other types of questions he could ask to be more successful. Despite extensive education in previous sessions, husband continues to fixate on "getting her talking back like  it was." SLP educated pt and husband that while speech may continue to improve, she is not likely to return to premorbid status and will likely need to use additional tools and strategies to communicate effectively. Several times in the session, pt told husband to "tell her," about an event (a rock breaking the glass door when mowing the grass, a letter received about [employer]raph[employer] device from SLP, turning the fan off in the house in the morning), unaware of speech errors that prevented pt's husband/SLP from understanding. Pt easily frustrated, raising her voice at her husband.  SLP used written cues and rephrasing to make pt aware of errors, and with usual question cues from SLP, pt was able to expand on each topic enough for husband to understand. Husband referenced [employer]raph[employer] and wanting to use pictures with pt, while pt simultaneously shaking her head. SLP told pt and husband that assistive means will only be effective if we can determine a system that pt is willing to use. SLP suggested using low-tech communication book; husband to bring loose pictures of family members and fill in a personal lexicon worksheet to aid in developing a system for pt.       Assessment / Recommendations / Plan   Plan  Goals updated      Progression Toward Goals   Progression toward goals  Not progressing toward goals (comment)   goals revised; pt has poor awareness of errors      SLP Education - 03/21/19 1507    Education Details  prognosis for speech recovery, pt will likely need to use assistive or alternative means of communication to aid verbal speech    Person(s) Educated  Patient;Spouse    Methods  Explanation;Demonstration;Verbal cues;Handout    Comprehension  Verbalized understanding;Verbal cues required;Need further instruction       SLP Short Term Goals - 03/21/19 1515      SLP SHORT TERM GOAL #1   Title  Pt will demo awareness of 50% of errors in a functional task with usual mod A (verbal/written cues).     Status  Partially Met      SLP SHORT TERM GOAL #2   Title  Pt will communicate preferences or needs (food, activities, medical etc) x 3 sessions with usual mod A (AAC if necessary).    Baseline  11-25-18 (vegetables), 12-12-18 (fruits, vegetables, tv dinners)    Status  Partially Met      SLP SHORT TERM GOAL #3   Title  Pt will communicate personal details (medical history, family, name, age, etc) with usual mod A x 3 sessions (AAC if necessary)    Baseline  pain level 12-12-18, 12/19/18    Status  Partially Met      SLP SHORT TERM GOAL #4   Title  When given  verbal or written cues of speech errors, pt will attempt to rephrase or use AAC to clarify her message with usual mod A x 3 sessions.    Time  4    Period  Weeks    Status  New      SLP SHORT TERM GOAL #5   Title  Pt will use AAC to communicate a request (food, assistance, clothing) outside of ST session with assistance from spouse (per pt/spouse report).    Time  4    Period  Weeks    Status  New       SLP Long Term Goals - 03/21/19 1519      SLP LONG TERM GOAL #  1   Title  Pt will attempt error correction in 3/5 opportunities when cued appropriately by communication partner x 3 sessions.    Baseline  word level 12/27/18    Time  8   renewed 03/21/19   Period  Weeks    Status  Revised      SLP LONG TERM GOAL #2   Title  Family will demo understanding of at least 4 appropriate ways to enhance pt's comprehension/expression during 4 sessions.     Time  8   renewed 03/21/19   Period  Weeks    Status  Revised      SLP LONG TERM GOAL #3   Title  Pt will reduce social isolation risks by communicating social introductions, common social messages, and personal interests using multimodal communication with occasional min A from communication partner over 4 sessions.    Time  4    Period  Weeks    Status  Deferred      SLP LONG TERM GOAL #4   Title  Pt/family will report improvement in understanding of pt's needs and requests than prior to ST.    Time  8   renewed 03/21/19   Period  Weeks    Status  Revised       Plan - 03/21/19 1510    Clinical Impression Statement  Patient continues with severe expressive aphasia, with expressive > receptive deficits. Pt did respond with several functional phrases throughout session, though these frequently deteriorated into jargon with longer utternaces. Pt remains unaware of verbal errors approx 60- 70% of the time, SLP repetition or written cues of pt's verbal output to incr pt awareness appeared intermittently successful. Pt/husband both report  frustration with frequent communication breakdowns at home. I recommend cont'd skilled ST to address incr'd pt awareness of verbal errors, to maximize communication and to train communication partner for pt use of AAC for wants/needs, safety, independence and QOL. As communication device trial with SGD was not well-accepted by patient, will attempt low-tech AAC. If pt awareness limits acceptance/willingness to use AAC, will consider d/c until awareness improves.     Speech Therapy Frequency  1x /week    Duration  --   8 weeks   Treatment/Interventions  Cognitive reorganization;Multimodal communcation approach;Compensatory strategies;Language facilitation;Compensatory techniques;Cueing hierarchy;Internal/external aids;Functional tasks;SLP instruction and feedback;Patient/family education    Potential to Achieve Goals  Fair    Potential Considerations  Cooperation/participation level;Severity of impairments;Ability to learn/carryover information    Consulted and Agree with Plan of Care  Patient;Family member/caregiver    Family Member Consulted  Husband [name]       Patient will benefit from skilled therapeutic intervention in order to improve the following deficits and impairments:   Aphasia  Cognitive communication deficit    Problem List Patient Active Problem List   Diagnosis Date Noted  . Cough 03/21/2019  . Expressive aphasia 11/17/2018  . Gait disturbance, post-stroke 11/17/2018  . Hemiparesis affecting dominant side as late effect of cerebrovascular accident ([place]) 11/17/2018  . Aphasia, post-stroke 11/17/2018  . Aphasia as late effect of stroke 11/08/2018  . 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 Left middle cerebral artery stroke ([place]) 10/06/2018  . Dyslipidemia   . Diabetes mellitus type 2 in nonobese (HCC)   . Atrial fibrillation ([place])   . Tachypnea   . Stage 3 chronic kidney disease ([place])   . Stroke [facility]) ischemic embolic L MCA  d/t AF 37/48/2707  . Middle cerebral artery  embolism, left 10/03/2018  . Frequent  falls 10/13/2017  . Vitamin D deficiency 04/22/2017  . Slurring of speech 03/31/2017  . Back pain 09/14/2016  . Compression fracture of thoracic vertebra (HCC) 09/14/2016  . S/P vertebroplasty 09/14/2016  . Chronic diastolic heart failure ([place]) 09/14/2016  . HTN (hypertension) 09/14/2016  . History of CVA (cerebrovascular accident) 09/14/2016  . Hypokalemia 09/11/2016  . Paraspinal hematoma 09/11/2016  . Osteoporosis 05/27/2016  . Hypothyroidism 09/11/2014  . Persistent atrial fibrillation ([place]) 12/15/2012  . HLD (hyperlipidemia) 03/27/2011   Deneise Lever, [place], [place] [address] [place] 03/21/2019, 3:23 PM  [facility] [address] [place], [place], [address] Phone: [phone]   Fax:  [phone]   Name: [name] MRN: [id number] Date of Birth: 1930-01-29

## 2019-03-22 ENCOUNTER — Encounter: Payer: Medicare Other | Admitting: Occupational Therapy

## 2019-03-24 ENCOUNTER — Encounter: Payer: Medicare Other | Admitting: Occupational Therapy

## 2019-03-29 ENCOUNTER — Ambulatory Visit: Payer: Medicare Other | Admitting: Physical Therapy

## 2019-03-30 ENCOUNTER — Other Ambulatory Visit: Payer: Self-pay

## 2019-03-30 ENCOUNTER — Ambulatory Visit: Payer: Medicare Other

## 2019-03-30 DIAGNOSIS — R4701 Aphasia: Secondary | ICD-10-CM | POA: Diagnosis not present

## 2019-03-30 DIAGNOSIS — R41841 Cognitive communication deficit: Secondary | ICD-10-CM | POA: Diagnosis not present

## 2019-03-30 NOTE — Therapy (Signed)
[facility] [address] [place], [place], [address] Phone: [phone]   Fax:  [phone]  Speech Language Pathology Treatment  Patient Details  Name: [name] MRN: [id number] Date of Birth: [birth year] Referring Provider (SLP): Dr. Letta Pate   Encounter Date: 03/30/2019  End of Session - 03/30/19 1640    Visit Number  19    Number of Visits  26    Date for SLP Re-Evaluation  06/19/19    Authorization Type  MCR    Authorization Time Period  90 days    SLP Start Time  1404    SLP Stop Time   1446    SLP Time Calculation (min)  42 min    Activity Tolerance  Patient tolerated treatment well       Past Medical History:  Diagnosis Date  . Atrial fibrillation ([place])   . Chronic anticoagulation   . Chronic anticoagulation   . CVA (cerebrovascular accident) ([place])   . Diabetes mellitus    pt & husband deny any knowledge of this   . History of chicken pox   . Hyperlipidemia   . Hypertension     Past Surgical History:  Procedure Laterality Date  . APPENDECTOMY    . BACK SURGERY    . CARDIAC CATHETERIZATION  11/09/91   EF 70%  . DILATION AND CURETTAGE OF UTERUS    . IR PERCUTANEOUS ART THROMBECTOMY/INFUSION INTRACRANIAL INC DIAG ANGIO  10/03/2018  . RADIOLOGY WITH ANESTHESIA N/A 10/03/2018   Procedure: IR WITH ANESTHESIA;  Surgeon: Radiologist, Medication, MD;  Location: [facility];  Service: Radiology;  Laterality: N/A;    There were no vitals filed for this visit.  Subjective Assessment - 03/30/19 1435    Subjective  "We have a coppsa breezes. We have copps."Pt, re: "Could y'all bring in a photo album next time?"    Patient is accompained by:  Family member   [name]   Currently in Pain?  No/denies            ADULT SLP TREATMENT - 03/30/19 1437      General Information   Behavior/Cognition  Alert;Cooperative;Requires cueing      Treatment Provided   Treatment provided   Cognitive-Linquistic      Cognitive-Linquistic Treatment   Treatment focused on  Aphasia;Apraxia    Skilled Treatment  Pt arrived with husband telling SLP that pt woke up one morning and "forgot all the names - my name, her name. Honey, what's my name?" SLP strongly discouraged husband throughtout session to cease from asking pt people's names, and provided rationale for this. Husband asked pt someone's name regarding pictures he brought 3 more times during the session - each time SLP immediately reminding husband not to do so and modeling an alternative, "Honey, your grandaughter's name is [name]---" and then not pressing the task if pt unable to verbalize name. The fourth time husband asked pt someone's name, SLP immediately stopped pt and asked him what to do as an alternative. Husband then used SLP suggestion. Pt became tearful x2 during session about her reduced speech/language output, SLP offered reassurance that a photo album with pictures of people and places, and common phrases pt needs to communicate will assist pt to communicate. SLP spent the session obtaiing these phrases and people's names, as husband did not do so over the previous week. SLP also provided rationale to husband for information found in "pt instructions",  and provided distinct directions for him to complete  until next session. Pt attempted corrections for paraphasias less than 50% of the time duing today's session, indicating reduced awareness of errors (see "S").       Assessment / Recommendations / Plan   Plan  Goals updated      Progression Toward Goals   Progression toward goals  Not progressing toward goals (comment)       SLP Education - 03/30/19 1455    Education Details  husband should never ask pt what someone's name is, picture and phrase notebook for pt is necessary for pt to incr her communicative effectiveness, husband needs to bring in a photo album    Person(s) Educated  Patient;Spouse    Methods   Explanation;Demonstration;Handout    Comprehension  Need further instruction;Verbalized understanding       SLP Short Term Goals - 03/30/19 1642      SLP SHORT TERM GOAL #1   Title  Pt will demo awareness of 50% of errors in a functional task with usual mod A (verbal/written cues).     Status  Partially Met      SLP SHORT TERM GOAL #2   Title  Pt will communicate preferences or needs (food, activities, medical etc) x 3 sessions with usual mod A (AAC if necessary).    Baseline  11-25-18 (vegetables), 12-12-18 (fruits, vegetables, tv dinners)    Status  Partially Met      SLP SHORT TERM GOAL #3   Title  Pt will communicate personal details (medical history, family, name, age, etc) with usual mod A x 3 sessions (AAC if necessary)    Baseline  pain level 12-12-18, 12/19/18    Status  Partially Met      SLP SHORT TERM GOAL #4   Title  When given verbal or written cues of speech errors, pt will attempt to rephrase or use AAC to clarify her message with usual mod A x 3 sessions.    Time  4    Period  Weeks    Status  On-going      SLP SHORT TERM GOAL #5   Title  Pt will use AAC to communicate a request (food, assistance, clothing) outside of ST session with assistance from spouse (per pt/spouse report).    Time  4    Period  Weeks    Status  On-going       SLP Long Term Goals - 03/30/19 1642      SLP LONG TERM GOAL #1   Title  Pt will attempt error correction in 3/5 opportunities when cued appropriately by communication partner x 3 sessions.    Baseline  word level 12/27/18    Time  8   renewed 03/21/19   Period  Weeks    Status  On-going      SLP LONG TERM GOAL #2   Title  Family will demo understanding of at least 4 appropriate ways to enhance pt's comprehension/expression during 4 sessions.     Time  8   renewed 03/21/19   Period  Weeks    Status  On-going      SLP LONG TERM GOAL #3   Title  Pt will reduce social isolation risks by communicating social introductions, common  social messages, and personal interests using multimodal communication with occasional min A from communication partner over 4 sessions.    Time  4    Period  Weeks    Status  Deferred      SLP LONG TERM GOAL #  4   Title  Pt/family will report improvement in understanding of pt's needs and requests than prior to ST.    Time  8   renewed 03/21/19   Period  Weeks    Status  On-going       Plan - 03/30/19 1640    Clinical Impression Statement  Patient continues with severe expressive aphasia, with expressive > receptive deficits. Pt did respond with several functional phrases throughout session, though these frequently deteriorated into jargon with longer utternaces. Pt remains unaware of verbal errors approx 50% of the time today.l. Pt/husband both report frustration with frequent communication breakdowns at home. I recommend cont'd skilled ST to address incr'd pt awareness of verbal errors, to maximize communication and to train communication partner for pt use of AAC for wants/needs, safety, independence and QOL. As communication device trial with SGD was not well-accepted by patient, will attempt low-tech AAC. If pt awareness limits acceptance/willingness to use AAC, will consider d/c until awareness improves.    Speech Therapy Frequency  1x /week       Patient will benefit from skilled therapeutic intervention in order to improve the following deficits and impairments:   Aphasia  Cognitive communication deficit    Problem List Patient Active Problem List   Diagnosis Date Noted  . Cough 03/21/2019  . Expressive aphasia 11/17/2018  . Gait disturbance, post-stroke 11/17/2018  . Hemiparesis affecting dominant side as late effect of cerebrovascular accident ([place]) 11/17/2018  . Aphasia, post-stroke 11/17/2018  . Aphasia as late effect of stroke 11/08/2018  . 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 Left middle cerebral artery stroke ([place]) 10/06/2018  . Dyslipidemia   . Diabetes mellitus  type 2 in nonobese ([facility])   . Atrial fibrillation ([place])   . Tachypnea   . Stage 3 chronic kidney disease ([place])   . Stroke [facility]) ischemic embolic L MCA  d/t AF 03/50/0938  . 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 Frequent falls 10/13/2017  . Vitamin D deficiency 04/22/2017  . Slurring of speech 03/31/2017  . Back pain 09/14/2016  . Compression fracture of thoracic vertebra ([facility]) 09/14/2016  . S/P vertebroplasty 09/14/2016  . Chronic diastolic heart failure ([place]) 09/14/2016  . HTN (hypertension) 09/14/2016  . History of CVA (cerebrovascular accident) 09/14/2016  . Hypokalemia 09/11/2016  . Paraspinal hematoma 09/11/2016  . Osteoporosis 05/27/2016  . Hypothyroidism 09/11/2014  . Persistent atrial fibrillation ([place]) 12/15/2012  . HLD (hyperlipidemia) 03/27/2011    [facility] ,MS, CCC-SLP  03/30/2019, 4:42 PM  [facility] [address] [place], [place], [address] Phone: [phone]   Fax:  [phone]   Name: [name] MRN: [id number] Date of Birth: 1930-09-16

## 2019-03-30 NOTE — Patient Instructions (Addendum)
[name], [name]: (BRING TO NEXT SPEECH THERAPY SESSiON)1   p   1)   2)   3)   4)   5)   6)   7)

## 2019-04-06 ENCOUNTER — Other Ambulatory Visit: Payer: Self-pay

## 2019-04-06 ENCOUNTER — Ambulatory Visit: Payer: Medicare Other | Admitting: Speech Pathology

## 2019-04-06 DIAGNOSIS — R41841 Cognitive communication deficit: Secondary | ICD-10-CM

## 2019-04-06 DIAGNOSIS — R4701 Aphasia: Secondary | ICD-10-CM

## 2019-04-06 NOTE — Therapy (Signed)
[facility] [address] [place], [place], [address] Phone: [phone]   Fax:  [phone]  Speech Language Pathology Treatment  Patient Details  Name: [name] MRN: [id number] Date of Birth: [birth year] Referring Provider (SLP): Dr. Letta Pate   Encounter Date: 04/06/2019  End of Session - 04/06/19 1501    Visit Number  20    Number of Visits  26    Date for SLP Re-Evaluation  06/19/19    Authorization Type  MCR    Authorization Time Period  90 days    SLP Start Time  1420   20 minutes late   SLP Stop Time   1448    SLP Time Calculation (min)  28 min    Activity Tolerance  Patient tolerated treatment well       Past Medical History:  Diagnosis Date  . Atrial fibrillation ([place])   . Chronic anticoagulation   . Chronic anticoagulation   . CVA (cerebrovascular accident) ([place])   . Diabetes mellitus    pt & husband deny any knowledge of this   . History of chicken pox   . Hyperlipidemia   . Hypertension     Past Surgical History:  Procedure Laterality Date  . APPENDECTOMY    . BACK SURGERY    . CARDIAC CATHETERIZATION  11/09/91   EF 70%  . DILATION AND CURETTAGE OF UTERUS    . IR PERCUTANEOUS ART THROMBECTOMY/INFUSION INTRACRANIAL INC DIAG ANGIO  10/03/2018  . RADIOLOGY WITH ANESTHESIA N/A 10/03/2018   Procedure: IR WITH ANESTHESIA;  Surgeon: Radiologist, Medication, MD;  Location: [facility];  Service: Radiology;  Laterality: N/A;    There were no vitals filed for this visit.  Subjective Assessment - 04/06/19 1422    Subjective  "My bad's been hurting." (back)    Currently in Pain?  Yes    Pain Score  7             ADULT SLP TREATMENT - 04/06/19 1422      General Information   Behavior/Cognition  Alert;Cooperative;Requires cueing      Treatment Provided   Treatment provided  Cognitive-Linquistic      Cognitive-Linquistic Treatment   Treatment focused on  Aphasia;Apraxia    Skilled Treatment  Pt 20 minutes late as husband mixed up appointment time. Husband brought photo album with pictures of pt and family, with names on sticky notes on each page. SLP asked pt to tell her about the album, and pt talked more fluently than in previous sessions, spontaneously stating several names correctly, and though she was typically unaware of incorrect productions, she pointed at names when clarification requested. Husband continued to ask "What's his/her name" periodically. As this has been addressed repeatedly in previous sessions with husband, instead of correcting him SLP ignored husband's comments and continued to talk with pt about the photos, acknowledging competence: "It's OK that you didn't say the name right, I knew exactly who you were talking about because you showed me." At end of session husband agreed that pt's speech was more fluent and "she said a lot more." Pt became tearful as husband talked about how frustrated pt gets at home and he began comforting her by saying, "It's OK, you'll get it back." SLP gently re-educated re: prognosis and that pt will likely struggle chronically with her communication, but that we are working on strategies to help her communicate better and reduce frustration. SLP asked pt's husband to include pictures of rooms  of their home in photo album or email pictures from his phone for to SLP to print for him.       Assessment / Recommendations / Plan   Plan  Continue with current plan of care      Progression Toward Goals   Progression toward goals  Progressing toward goals       SLP Education - 04/06/19 1500    Education Details  how to reveal competence and support effective communication with images    Person(s) Educated  Patient;Spouse    Methods  Demonstration    Comprehension  Need further instruction       SLP Short Term Goals - 04/06/19 1551      SLP SHORT TERM GOAL #1   Title  Pt will demo awareness of 50% of errors in a  functional task with usual mod A (verbal/written cues).     Status  Partially Met      SLP SHORT TERM GOAL #2   Title  Pt will communicate preferences or needs (food, activities, medical etc) x 3 sessions with usual mod A (AAC if necessary).    Baseline  11-25-18 (vegetables), 12-12-18 (fruits, vegetables, tv dinners)    Status  Partially Met      SLP SHORT TERM GOAL #3   Title  Pt will communicate personal details (medical history, family, name, age, etc) with usual mod A x 3 sessions (AAC if necessary)    Baseline  pain level 12-12-18, 12/19/18    Status  Partially Met      SLP SHORT TERM GOAL #4   Title  When given verbal or written cues of speech errors, pt will attempt to rephrase or use AAC to clarify her message with usual mod A x 3 sessions.    Time  4    Period  Weeks    Status  On-going      SLP SHORT TERM GOAL #5   Title  Pt will use AAC to communicate a request (food, assistance, clothing) outside of ST session with assistance from spouse (per pt/spouse report).    Time  4    Period  Weeks    Status  On-going       SLP Long Term Goals - 04/06/19 1551      SLP LONG TERM GOAL #1   Title  Pt will attempt error correction in 3/5 opportunities when cued appropriately by communication partner x 3 sessions.    Baseline  word level 12/27/18    Time  7   renewed 03/21/19   Period  Weeks    Status  On-going      SLP LONG TERM GOAL #2   Title  Family will demo understanding of at least 4 appropriate ways to enhance pt's comprehension/expression during 4 sessions.     Time  7   renewed 03/21/19   Period  Weeks    Status  On-going      SLP LONG TERM GOAL #3   Title  Pt will reduce social isolation risks by communicating social introductions, common social messages, and personal interests using multimodal communication with occasional min A from communication partner over 4 sessions.    Time  4    Period  Weeks    Status  Deferred      SLP LONG TERM GOAL #4   Title  Pt/family  will report improvement in understanding of pt's needs and requests than prior to ST.    Time  7  renewed 03/21/19   Period  Weeks    Status  On-going       Plan - 04/06/19 1502    Clinical Impression Statement  Patient continues with severe expressive aphasia, with expressive > receptive deficits. When using low tech visual supports (photo album with family names written), pt generated several sentences about each picture spontaneously, though remains largely unaware of verbal errors (approx 50% of the time). Pt/husband both report frustration with frequent communication breakdowns at home. I recommend cont'd skilled ST to address incr'd pt awareness of verbal errors, to maximize communication and to train communication partner for pt use of AAC for wants/needs, safety, independence and QOL.    Speech Therapy Frequency  1x /week       Patient will benefit from skilled therapeutic intervention in order to improve the following deficits and impairments:   1. Aphasia   2. Cognitive communication deficit       Problem List Patient Active Problem List   Diagnosis Date Noted  . Cough 03/21/2019  . Expressive aphasia 11/17/2018  . Gait disturbance, post-stroke 11/17/2018  . Hemiparesis affecting dominant side as late effect of cerebrovascular accident ([place]) 11/17/2018  . Aphasia, post-stroke 11/17/2018  . Aphasia as late effect of stroke 11/08/2018  . 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 Left middle cerebral artery stroke ([place]) 10/06/2018  . Dyslipidemia   . Diabetes mellitus type 2 in nonobese ([facility])   . Atrial fibrillation ([place])   . Tachypnea   . Stage 3 chronic kidney disease ([place])   . Stroke [facility]) ischemic embolic L MCA  d/t AF 24/26/8341  . 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 Frequent falls 10/13/2017  . Vitamin D deficiency 04/22/2017  . Slurring of speech 03/31/2017  . Back pain 09/14/2016  . Compression fracture of thoracic vertebra ([facility]) 09/14/2016  . S/P  vertebroplasty 09/14/2016  . Chronic diastolic heart failure ([place]) 09/14/2016  . HTN (hypertension) 09/14/2016  . History of CVA (cerebrovascular accident) 09/14/2016  . Hypokalemia 09/11/2016  . Paraspinal hematoma 09/11/2016  . Osteoporosis 05/27/2016  . Hypothyroidism 09/11/2014  . Persistent atrial fibrillation ([place]) 12/15/2012  . HLD (hyperlipidemia) 03/27/2011   Deneise Lever, [place], [place] 04/06/2019, 3:53 PM  [facility] [address] [place], [place], [address] Phone: [phone]   Fax:  [phone]   Name: Jayd Forrey MRN: [id number] Date of Birth: 1929-12-20

## 2019-04-06 NOTE — Patient Instructions (Signed)
Great job with the photo album! Keep adding more pictures of things Jazmaine can use to help her talk about herself and her family.   Add a picture of some of the rooms of your house.   You can email pictures from your phone to [email or web address].

## 2019-04-07 ENCOUNTER — Encounter: Payer: Medicare Other | Attending: Physical Medicine & Rehabilitation | Admitting: Physical Medicine & Rehabilitation

## 2019-04-07 ENCOUNTER — Encounter: Payer: Self-pay | Admitting: Physical Medicine & Rehabilitation

## 2019-04-07 VITALS — BP 134/56 | HR 41 | Temp 98.3°F | Ht 64.0 in | Wt 113.0 lb

## 2019-04-07 DIAGNOSIS — I129 Hypertensive chronic kidney disease with stage 1 through stage 4 chronic kidney disease, or unspecified chronic kidney disease: Secondary | ICD-10-CM | POA: Insufficient documentation

## 2019-04-07 DIAGNOSIS — E1122 Type 2 diabetes mellitus with diabetic chronic kidney disease: Secondary | ICD-10-CM | POA: Diagnosis not present

## 2019-04-07 DIAGNOSIS — Z8673 Personal history of transient ischemic attack (TIA), and cerebral infarction without residual deficits: Secondary | ICD-10-CM | POA: Diagnosis not present

## 2019-04-07 DIAGNOSIS — N189 Chronic kidney disease, unspecified: Secondary | ICD-10-CM | POA: Diagnosis not present

## 2019-04-07 DIAGNOSIS — R269 Unspecified abnormalities of gait and mobility: Secondary | ICD-10-CM

## 2019-04-07 DIAGNOSIS — I69390 Apraxia following cerebral infarction: Secondary | ICD-10-CM

## 2019-04-07 DIAGNOSIS — I63512 Cerebral infarction due to unspecified occlusion or stenosis of left middle cerebral artery: Secondary | ICD-10-CM | POA: Insufficient documentation

## 2019-04-07 DIAGNOSIS — R482 Apraxia: Secondary | ICD-10-CM | POA: Diagnosis not present

## 2019-04-07 DIAGNOSIS — I482 Chronic atrial fibrillation, unspecified: Secondary | ICD-10-CM | POA: Diagnosis not present

## 2019-04-07 DIAGNOSIS — R4701 Aphasia: Secondary | ICD-10-CM | POA: Insufficient documentation

## 2019-04-07 DIAGNOSIS — I69320 Aphasia following cerebral infarction: Secondary | ICD-10-CM | POA: Diagnosis not present

## 2019-04-07 DIAGNOSIS — I69398 Other sequelae of cerebral infarction: Secondary | ICD-10-CM | POA: Diagnosis not present

## 2019-04-07 DIAGNOSIS — Z7901 Long term (current) use of anticoagulants: Secondary | ICD-10-CM | POA: Diagnosis not present

## 2019-04-07 DIAGNOSIS — R262 Difficulty in walking, not elsewhere classified: Secondary | ICD-10-CM | POA: Diagnosis not present

## 2019-04-07 DIAGNOSIS — Z87891 Personal history of nicotine dependence: Secondary | ICD-10-CM | POA: Insufficient documentation

## 2019-04-07 NOTE — Progress Notes (Signed)
Subjective:  85 year old right-handed female with history of hypertension, diet-controlled diabetes, CVA 10 years ago, chronic anticoagulation secondary to atrial fibrillation. Lives with her spouse. Used a cane prior to admission.  Presented 10/03/2018 with sudden onset of right-sided weakness and aphasia. INR on admission of 2.52. Cranial CT scan unremarkable for acute process. CT angiogram of head and neck showed left M2 branch occlusion. MRI infarction, left MCA territory.  No hemorrhage. Interventional radiology attempted revascularization unsuccessful. Echocardiogram with ejection fraction of 60%.   Patient ID: [name], female    DOB: 10-Jan-1930, 85 y.o.   MRN: [id number]  HPI 85 year old female with history of chronic atrial fibrillation on Eliquis, hypertension and diabetes as well as chronic renal insufficiency.  She suffered a left MCA distribution infarct 10/03/2018 in the posterior branch of the left MCA Patient has had residual Warnicke's aphasia as well as apraxia.  She also complains of funny feelings in the right arm and right leg The patient states that about 2 weeks ago she woke up and no longer was able to remember the name of family members.  Husband states that the patient has not had any falls recently, she has not start start any new medications.  She has not had any other illnesses. The patient did not let any of her doctors know about this.  She continues to receive outpatient therapy with speech, PT and OT  Pain Inventory Average Pain 7 Pain Right Now 5 My pain is aching  In the last 24 hours, has pain interfered with the following? General activity 5 Relation with others 5 Enjoyment of life 5 What TIME of day is your pain at its worst? evening Sleep (in general) Poor  Pain is worse with: some activites Pain improves with: medication Relief from Meds: 3  Mobility use a walker ability to climb steps?  no do you drive?  no use a  wheelchair  Function retired  Neuro/Psych trouble walking  Prior Studies Any changes since last visit?  no  Physicians involved in your care Any changes since last visit?  no   Family History  Problem Relation Age of Onset  . Heart disease Mother   . Hypertension Mother   . Stroke Mother   . Heart disease Father   . Heart failure Brother   . Osteoporosis Neg Hx    Social History   Socioeconomic History  . Marital status: Married    Spouse name: Not on file  . Number of children: 2  . Years of education: Not on file  . Highest education level: High school graduate  Occupational History  . Not on file  Social Needs  . Financial resource strain: Not on file  . Food insecurity    Worry: Not on file    Inability: Not on file  . Transportation needs    Medical: Not on file    Non-medical: Not on file  Tobacco Use  . Smoking status: Former Smoker    Quit date: 10/19/1998    Years since quitting: 20.4  . Smokeless tobacco: Never Used  Substance and Sexual Activity  . Alcohol use: No  . Drug use: No  . Sexual activity: Not on file  Lifestyle  . Physical activity    Days per week: Not on file    Minutes per session: Not on file  . Stress: Not on file  Relationships  . Social Musicianconnections    Talks on phone: Not on file    Gets  together: Not on file    Attends religious service: Not on file    Active member of club or organization: Not on file    Attends meetings of clubs or organizations: Not on file    Relationship status: Not on file  Other Topics Concern  . Not on file  Social History Narrative   Lives at home with her husband    Right handed   Caffeine: sometimes coffee   Past Surgical History:  Procedure Laterality Date  . APPENDECTOMY    . BACK SURGERY    . CARDIAC CATHETERIZATION  11/09/91   EF 70%  . DILATION AND CURETTAGE OF UTERUS    . IR PERCUTANEOUS ART THROMBECTOMY/INFUSION INTRACRANIAL INC DIAG ANGIO  10/03/2018  . RADIOLOGY WITH  ANESTHESIA N/A 10/03/2018   Procedure: IR WITH ANESTHESIA;  Surgeon: Radiologist, Medication, MD;  Location: [facility];  Service: Radiology;  Laterality: N/A;   Past Medical History:  Diagnosis Date  . Atrial fibrillation ([place])   . Chronic anticoagulation   . Chronic anticoagulation   . CVA (cerebrovascular accident) ([place])   . Diabetes mellitus    pt & husband deny any knowledge of this   . History of chicken pox   . Hyperlipidemia   . Hypertension    There were no vitals taken for this visit.  Opioid Risk Score:   Fall Risk Score:  `1  Depression screen PHQ 2/9  Depression screen [facility] 2/9 11/08/2018 10/13/2017 09/24/2016 04/29/2016  Decreased Interest 0 0 0 0  Down, Depressed, Hopeless 1 0 0 0  PHQ - 2 Score 1 0 0 0  Altered sleeping 2 - 0 -  Tired, decreased energy 2 - 0 -  Change in appetite 0 - 0 -  Feeling bad or failure about yourself  3 - 0 -  Trouble concentrating 0 - 0 -  Moving slowly or fidgety/restless 3 - 0 -  Suicidal thoughts 0 - 0 -  PHQ-9 Score 11 - 0 -  Difficult doing work/chores Very difficult - - -  Some recent data might be hidden     Review of Systems  Constitutional: Negative.   HENT: Negative.   Eyes: Negative.   Respiratory: Positive for cough.   Cardiovascular: Negative.   Gastrointestinal: Negative.   Endocrine: Negative.   Genitourinary: Negative.   Musculoskeletal: Positive for gait problem.  Skin: Negative.   Allergic/Immunologic: Negative.   Hematological: Negative.   Psychiatric/Behavioral: Negative.   All other systems reviewed and are negative.      Objective:   Physical Exam Constitutional:      Appearance: Normal appearance.  HENT:     Head: Normocephalic and atraumatic.  Eyes:     Extraocular Movements: Extraocular movements intact.     Conjunctiva/sclera: Conjunctivae normal.     Pupils: Pupils are equal, round, and reactive to light.  Neurological:     Mental Status: She is alert.     Gait: Gait abnormal.      Comments: Motor strength is 4+ bilateral deltoid bicep tricep grip hip flexor knee extensor ankle dorsiflexor although it is difficult to perform manual muscle testing due to her apraxia.  She needs gestural cues to imitate  Cannot assess memory due to aphasia.  She has difficulty following two-step commands  She has occasional fluent phrase level utterances but mainly nonsensical or tangential  Patient is able to ambulate without assistive device with close standby assistance.  Sensation difficult to assess secondary to aphasia.  She does indicate  that there is some difference between the right arm left arm as well as right leg and left leg  Psychiatric:        Mood and Affect: Mood normal.    No evidence of field cut on confrontation testing       Assessment & Plan:  #1.  Left posterior branch MCA infarct with Wernicke's aphasia, apraxia, gait disorder.  She is approximately 6 months post stroke.  Both the husband and the patient are little frustrated with her residual deficits.  She notes that in 2010 she had a episode that was similar with aphasia but only lasted about an hour.  We discussed that she had a TIA in 2010 and an actual stroke in 2019 Both patient and husband note that there has been some additional loss of naming family members in the last couple weeks.  Compared to my previous evaluation she does not appear to have any other neurologic deficits. I have asked her to make an appointment with her neurologist. I will see her back in approximately 2 months From adjustment standpoint she may benefit from neuropsychology evaluation

## 2019-04-07 NOTE — Patient Instructions (Signed)
I sent a message to Dr Jaynee Eagles regarding your new memory problems

## 2019-04-10 ENCOUNTER — Telehealth: Payer: Self-pay | Admitting: Neurology

## 2019-04-10 NOTE — Telephone Encounter (Signed)
I called patient and spoke with her husband. Patient's husband is agreeable to bringing patient in tomorrow for an in office visit.

## 2019-04-10 NOTE — Telephone Encounter (Signed)
Noted! Thank you

## 2019-04-10 NOTE — Telephone Encounter (Signed)
[name], patient is a stroke patient and having some acute issues. Dr. Letta Pate would like her to be seen soon.  Will you call and get her an appointment with Janett Billow within the next 2 weeks please? If Janett Billow doesn't have anything let me know thanks!

## 2019-04-11 ENCOUNTER — Ambulatory Visit (INDEPENDENT_AMBULATORY_CARE_PROVIDER_SITE_OTHER): Payer: Medicare Other | Admitting: Adult Health

## 2019-04-11 ENCOUNTER — Other Ambulatory Visit: Payer: Self-pay

## 2019-04-11 ENCOUNTER — Encounter: Payer: Self-pay | Admitting: Adult Health

## 2019-04-11 VITALS — BP 126/70 | HR 67 | Temp 98.0°F | Wt 128.0 lb

## 2019-04-11 DIAGNOSIS — I6602 Occlusion and stenosis of left middle cerebral artery: Secondary | ICD-10-CM

## 2019-04-11 DIAGNOSIS — R29818 Other symptoms and signs involving the nervous system: Secondary | ICD-10-CM

## 2019-04-11 DIAGNOSIS — I6350 Cerebral infarction due to unspecified occlusion or stenosis of unspecified cerebral artery: Secondary | ICD-10-CM | POA: Diagnosis not present

## 2019-04-11 NOTE — Patient Instructions (Signed)
Continue Eliquis (apixaban) daily  and Lipitor  for secondary stroke prevention  Continue to follow up with PCP regarding cholesterol and blood pressure management   We will obtain CT scan of your brain and also imaging of your vessels to ensure no new stroke or vessel narrowing/occlusion due to new onset of worsening symptoms  Continue to participate in speech therapy as well as maintaining exercises at home on own  Continue to monitor blood pressure at home  Maintain strict control of hypertension with blood pressure goal below 130/90, diabetes with hemoglobin A1c goal below 6.5% and cholesterol with LDL cholesterol (bad cholesterol) goal below 70 mg/dL. I also advised the patient to eat a healthy diet with plenty of whole grains, cereals, fruits and vegetables, exercise regularly and maintain ideal body weight.  Followup in the future with me in 3 months or call earlier if needed       Thank you for coming to see [place] at [facility]. I hope we have been able to provide you high quality care today.  You may receive a patient satisfaction survey over the next few weeks. We would appreciate your feedback and comments so that we may continue to improve ourselves and the health of our patients.

## 2019-04-11 NOTE — Progress Notes (Signed)
[facility]    Provider:  Dr Lucia GaskinsAhern Referring Provider: Wylene Simmerisovec, Adelfa Kohichard W, MD Primary Care Provider:  Gaspar Garbeisovec, Richard W, MD  CC:  Embolic stroke due to afib  HPI:  04/12/19 VISIT Ms. Raymon MuttonReichard is a 85 year old female who is being seen today due to 2-week worsening of cognition and expressive aphasia.  She was initially seen in this office by Dr. Lucia GaskinsAhern on 11/17/2018 regarding left MCA infarct with M2 occlusion in 09/2018 with residual expressive aphasia, cognitive deficits and ambulation difficulty.  Husband accompanies patient to appointment today who provides majority of history.  She states she was initially improving but approximately 2 weeks ago, she woke up in the morning and had noticeable worsening of speech and memory difficulties with emphasis on being unable to remember family members names.  Husband seems overly frustrated by this as she was initially making progress and is now concerned of a setback.  He continually questions patient throughout visit of stating his name, their children's names and grandchildren names.  He appears frustrated when she states around names.  Denies any changes in medications, denies falls or traumatic event or any other neurological symptoms.  She continues to participate in speech therapy but difficulty determining if ongoing exercises at home.  She continues to live at home with her husband who provides majority of her care.  She is currently in a wheelchair and per husband, is able to ambulate short distances without assistive device and has assistance.  She continues on Eliquis 2.5 mg daily and atorvastatin 20 mg daily.  Blood pressure today satisfactory at 126/70.     Initial visit 11/17/2018 Dr. Lucia GaskinsAhern: Liliane Badearolyn W Halina AndreasWhite Reichard is a 85 y.o. female here as requested by provider Tisovec, Adelfa Kohichard W, MD for stroke. She has PMI of hypertension, hyperlipidemia,diet controlleddiabetes mellitus, CVA 10 years ago on chronic anticoagulation  secondary to atrial fibrillation followed by Dr. Duke Salviaandolph cardiology services, and chronic back pain.    She underwent kyphoplasty the end of November, 2019. The day following she had sudden onset of confusion and balance concerns (leaning to one side) that resolved within hours after restarting warfarin. She was seen by her cardiologist who referred her to neurology.   On 12/16 she was [facility] with acute confusion, word salad and progressive right sided weakness. She was found to have a left posterior branch MCA infarct. She was not a candidate for tPA due to taking warfarin w/ INR 2.5. Premorbid modified Rankin scale (mRS):4. NIH stroke scale15.Revasculazation of M2 occlusion attempted by Dr Corliss Skainseveshwar but was unsuccessful.  Echocardiogram with ejection fraction of 60%. Systolic function was normal. Neurology follow-upplan was for INR to be less than 2.00and then begin ELIQUIS in place o f Coumadin. She was admitted to inpatient rehab on 12/19.  She is here with husband who also provides much information and appears to be quite frustrated, discussed unfortunately medications are not 100% effective and we do see failures of anticoagulation.  I encouraged continued therapy.  She does continue to have some trouble with her balance and speech. She is having more trouble getting her words out. She seems to be comprehending well. She denies falls. She has PT, OT and ST three times a week with neuro rehab.   She had a previous stroke in 2010.     Review of Systems: Patient complains of symptoms per HPI as well as the following symptoms: Aphasia, ambulation difficulty, memory loss and confusion pertinent negatives and positives per HPI. All others  negative.    Social History   Socioeconomic History   Marital status: Married    Spouse name: Not on file   Number of children: 2   Years of education: Not on file   Highest education level: High school graduate  Occupational  History   Not on file  Social Needs   Financial resource strain: Not on file   Food insecurity    Worry: Not on file    Inability: Not on file   Transportation needs    Medical: Not on file    Non-medical: Not on file  Tobacco Use   Smoking status: Former Smoker    Quit date: 10/19/1998    Years since quitting: 20.4   Smokeless tobacco: Never Used  Substance and Sexual Activity   Alcohol use: No   Drug use: No   Sexual activity: Not on file  Lifestyle   Physical activity    Days per week: Not on file    Minutes per session: Not on file   Stress: Not on file  Relationships   Social connections    Talks on phone: Not on file    Gets together: Not on file    Attends religious service: Not on file    Active member of club or organization: Not on file    Attends meetings of clubs or organizations: Not on file    Relationship status: Not on file   Intimate partner violence    Fear of current or ex partner: Not on file    Emotionally abused: Not on file    Physically abused: Not on file    Forced sexual activity: Not on file  Other Topics Concern   Not on file  Social History Narrative   Lives at home with her husband    Right handed   Caffeine: sometimes coffee    Family History  Problem Relation Age of Onset   Heart disease Mother    Hypertension Mother    Stroke Mother    Heart disease Father    Heart failure Brother    Osteoporosis Neg Hx     Past Medical History:  Diagnosis Date   Atrial fibrillation ([place])    Chronic anticoagulation    Chronic anticoagulation    CVA (cerebrovascular accident) ([place])    Diabetes mellitus    pt & husband deny any knowledge of this    History of chicken pox    Hyperlipidemia    Hypertension     Patient Active Problem List   Diagnosis Date Noted   Cough 03/21/2019   Expressive aphasia 11/17/2018   Gait disturbance, post-stroke 11/17/2018   Hemiparesis affecting dominant side as late  effect of cerebrovascular accident ([place]) 11/17/2018   Aphasia, post-stroke 11/17/2018   Aphasia as late effect of stroke 11/08/2018   Apraxia, post-stroke 11/08/2018   Left middle cerebral artery stroke ([place]) 10/06/2018   Dyslipidemia    Diabetes mellitus type 2 in nonobese [facility])    Atrial fibrillation ([place])    Tachypnea    Stage 3 chronic kidney disease ([place])    Stroke ([place]) ischemic embolic L MCA  d/t AF 62/37/6283   Middle cerebral artery embolism, left 10/03/2018   Frequent falls 10/13/2017   Vitamin D deficiency 04/22/2017   Slurring of speech 03/31/2017   Back pain 09/14/2016   Compression fracture of thoracic vertebra ([place]) 09/14/2016   S/P vertebroplasty 09/14/2016   Chronic diastolic heart failure ([place]) 09/14/2016   HTN (hypertension) 09/14/2016  History of CVA (cerebrovascular accident) 09/14/2016   Hypokalemia 09/11/2016   Paraspinal hematoma 09/11/2016   Osteoporosis 05/27/2016   Hypothyroidism 09/11/2014   Persistent atrial fibrillation ([facility]) 12/15/2012   HLD (hyperlipidemia) 03/27/2011    Past Surgical History:  Procedure Laterality Date   APPENDECTOMY     BACK SURGERY     CARDIAC CATHETERIZATION  11/09/91   EF 70%   DILATION AND CURETTAGE OF UTERUS     IR PERCUTANEOUS ART THROMBECTOMY/INFUSION INTRACRANIAL INC DIAG ANGIO  10/03/2018   RADIOLOGY WITH ANESTHESIA N/A 10/03/2018   Procedure: IR WITH ANESTHESIA;  Surgeon: Radiologist, Medication, MD;  Location: [facility];  Service: Radiology;  Laterality: N/A;    Current Outpatient Medications  Medication Sig Dispense Refill   acetaminophen (TYLENOL) 325 MG tablet Take 2 tablets (650 mg total) by mouth every 4 (four) hours as needed for mild pain (or temp > 37.5 C (99.5 F)).     amLODipine (NORVASC) 10 MG tablet Take 1 tablet (10 mg total) by mouth daily. 90 tablet 3   apixaban (ELIQUIS) 2.5 MG TABS tablet Take 1 tablet (2.5 mg total) by mouth 2 (two) times daily. 180 tablet 1    atorvastatin (LIPITOR) 20 MG tablet Take 1 tablet (20 mg total) by mouth daily. 90 tablet 3   ELIQUIS 2.5 MG TABS tablet      levothyroxine (SYNTHROID, LEVOTHROID) 25 MCG tablet Take 1 tablet (25 mcg total) by mouth daily before breakfast. 30 tablet 0   nadolol (CORGARD) 20 MG tablet Take 0.5 tablets (10 mg total) by mouth daily. 30 tablet 1   nitroGLYCERIN (NITROSTAT) 0.4 MG SL tablet Place 0.4 mg under the tongue every 5 (five) minutes as needed (chest pain).      pantoprazole (PROTONIX) 40 MG tablet Take 1 tablet (40 mg total) by mouth daily. 90 tablet 2   No current facility-administered medications for this visit.     Allergies as of 04/11/2019 - Review Complete 04/11/2019  Allergen Reaction Noted   Cozaar Other (See Comments) 03/25/2011   Hydralazine Other (See Comments) 03/25/2011   Hyzaar [losartan potassium-hctz] Other (See Comments) 07/21/2012   Sulfa drugs cross reactors Other (See Comments) 03/25/2011   Lisinopril  03/25/2011    Vitals: BP 126/70 (BP Location: Right Arm, Patient Position: Sitting, Cuff Size: Small)    Pulse 67    Temp 98 F (36.7 C)    Wt 128 lb (58.1 kg) Comment: \   BMI 21.97 kg/m  Last Weight:  Wt Readings from Last 1 Encounters:  04/11/19 128 lb (58.1 kg)   Last Height:   Ht Readings from Last 1 Encounters:  04/07/19 5\' 4"  (1.626 m)     Physical exam: Exam: Gen: NAD                   CV: irregular, no MRG. No Carotid Bruits. No peripheral edema, warm, nontender Eyes: Conjunctivae clear without exudates or hemorrhage  Neuro: Detailed Neurologic Exam  Speech:    Moderate to severe expressive with mild receptive aphasia, she can follow simple commands with mild difficulty Cognition:    The patient is oriented to person, place, and time;     recent and remote memory impaired;     language aphasic;     normal attention, concentration,  fund of knowledge Cranial Nerves:    The pupils are equal, round, and reactive to light. Visual  fields are full to finger confrontation. Extraocular movements are intact. Trigeminal sensation is intact and the  muscles of mastication are normal. The face is symmetric. The palate elevates in the midline. Hearing intact. Voice is normal. Shoulder shrug is normal. The tongue has normal motion without fasciculations.   Coordination:    No dysmetria or consistent with weakness  Gait:    Gait assessment deferred as husband declines evaluation  Motor Observation:    No asymmetry, no atrophy, and no involuntary movements noted.  Tone:    Normal muscle tone.    Posture:    Unable to assess    Strength: Right upper and lower extremity mildly weak compared to left side; mildly decreased right finger dexterity     Sensation: intact to LT     Reflex Exam:  DTR's:    Deep tendon reflexes in the upper and lower symmetrical are normal bilaterally.   Toes:    The toes are equivocal bilaterally.   Clonus:    Clonus is absent.    Assessment/Plan: This is a lovely 85 year old female with a past medical history of A. fib previously on Coumadin, stroke 10 years ago without residual defects, diabetes, hypertension, hyperlipidemia who was admitted for aphasia and right-sided weakness, who unfortunately had embolic left-sided MCA strokes secondary to A. fib while properly anticoagulated on warfarin.  She was switched to Eliquis.  Requested appointment today due to concerns of abrupt worsening of aphasia and cognition approximately 2 weeks ago.   -Long discussion with patient and husband regarding likely worsening secondary to prior left MCA but will obtain CT head to ensure no new acute infarct as well as imaging tests of vessels with previous M2 occlusion with unsuccessful thrombectomy -Advised to continue to participate in speech therapy and to ensure ongoing exercises at home.   -Discussion regarding importance of decreasing frustrations and stress while working on these exercises as this will only  cause more difficulty for patient.  Also stressed importance of stress reduction techniques as some of this worsening and lack of improvement can be caused from ongoing stressors and anxiety at home -Continue Eliquis 2.5 mg twice daily and atorvastatin for secondary stroke prevention -discussion with husband regarding dose adjustment of Eliquis.  As weight is 58 kg, age 85 and creatinine 1.4, will be unlikely for Eliquis dose adjustment if new stroke is seen -Continue to follow with cardiology for atrial fibrillation and Eliquis management -Continue to follow with PCP for HTN, HLD and DM management -Continue regular exercise and maintain healthy diet at home  She will follow-up in 3 months or call earlier if needed  Greater than 50% of this 40-minute visit was spent discussing prior left MCA infarcts and residual deficits with recent worsening, ongoing management of atrial fibrillation, HTN, HLD and DM for secondary stroke prevention and answering all questions to patient and husband satisfaction  George HughJessica Carmela Piechowski, AGNP-BC  [facility] [address] [place], [place] [id number]  Phone [phone] Fax [phone] Note: This document was prepared with digital dictation and possible smart phrase technology. Any transcriptional errors that result from this process are unintentional.

## 2019-04-12 ENCOUNTER — Encounter: Payer: Self-pay | Admitting: Adult Health

## 2019-04-12 ENCOUNTER — Telehealth: Payer: Self-pay | Admitting: Adult Health

## 2019-04-12 NOTE — Progress Notes (Signed)
Made any corrections needed, and agree with history, physical, neuro exam,assessment and plan as stated.     Berdell Nevitt, MD [facility]  

## 2019-04-12 NOTE — Telephone Encounter (Signed)
Medicare/aarp order sent to [facility]. No auth they will reach out to the patient to schedule.  

## 2019-04-13 ENCOUNTER — Other Ambulatory Visit: Payer: Self-pay

## 2019-04-13 ENCOUNTER — Ambulatory Visit: Payer: Medicare Other | Admitting: Speech Pathology

## 2019-04-13 DIAGNOSIS — R4701 Aphasia: Secondary | ICD-10-CM | POA: Diagnosis not present

## 2019-04-13 DIAGNOSIS — R41841 Cognitive communication deficit: Secondary | ICD-10-CM

## 2019-04-13 NOTE — Therapy (Signed)
[facility] [address] [place], [place], [address] Phone: [phone]   Fax:  [phone]  Speech Language Pathology Treatment  Patient Details  Name: [name] MRN: [id number] Date of Birth: [birth year] Referring Provider (SLP): Dr. Letta Pate   Encounter Date: 04/13/2019  End of Session - 04/13/19 1828    Visit Number  21    Number of Visits  26    Date for SLP Re-Evaluation  06/19/19    Authorization Type  MCR    Authorization Time Period  90 days    SLP Start Time  1500    SLP Stop Time   1555    SLP Time Calculation (min)  55 min    Activity Tolerance  Patient tolerated treatment well       Past Medical History:  Diagnosis Date  . Atrial fibrillation ([place])   . Chronic anticoagulation   . Chronic anticoagulation   . CVA (cerebrovascular accident) ([place])   . Diabetes mellitus    pt & husband deny any knowledge of this   . History of chicken pox   . Hyperlipidemia   . Hypertension     Past Surgical History:  Procedure Laterality Date  . APPENDECTOMY    . BACK SURGERY    . CARDIAC CATHETERIZATION  11/09/91   EF 70%  . DILATION AND CURETTAGE OF UTERUS    . IR PERCUTANEOUS ART THROMBECTOMY/INFUSION INTRACRANIAL INC DIAG ANGIO  10/03/2018  . RADIOLOGY WITH ANESTHESIA N/A 10/03/2018   Procedure: IR WITH ANESTHESIA;  Surgeon: Radiologist, Medication, MD;  Location: [facility];  Service: Radiology;  Laterality: N/A;    There were no vitals filed for this visit.  Subjective Assessment - 04/13/19 1503    Subjective  "Nothing new really this week."    Currently in Pain?  Yes    Pain Score  6     Pain Location  Back            ADULT SLP TREATMENT - 04/13/19 1505      General Information   Behavior/Cognition  Alert;Cooperative;Requires cueing      Treatment Provided   Treatment provided  Cognitive-Linquistic      Cognitive-Linquistic Treatment   Treatment focused on  Aphasia;Apraxia     Skilled Treatment  Pt's husband forgot photo album today. SLP used visuals with pt to stimulate simple conversation, including a map. Pt required usual mod cues and demonstration to use the map to communicate places she has lived and traveled. SLP had to direct husband not to "quiz" patient by pointing to locations and stating, "What's the name of that, honey?" Per husband, head CT is planned after neuro follow-up as they reported worsening of speech difficulties at recent follow-up. In this SLP's opinion, pt's language compensurate if not somewhat improved compared with sessions in March prior to therapy hold d/t Covid 19. In today's session, pt was easily frustrated by breakdowns and seemingly unaware that her language was not understandable by her husband at times and was frequently tearful. SLP had frank conversation with pt and husband today re: prognosis. SLP has repeatedly explained to husband that with pt's deficits, using confrontation and drilling are not effective and only increase frustration, however pt's husband required ongoing explanation today that pt cannot simply "relearn" words. SLP explained that pt progress likely to plateau without her recognizing need/being willing to implement compensatory strategies. SLP proposed taking a break from therapy may be helpful to allow pt additional time to  adjust to her communication disorder and in the hopes that improvement in awareness may help her be more receptive to cuing strategies and augmentative means of communication.       Assessment / Recommendations / Plan   Plan  Continue with current plan of care      Progression Toward Goals   Progression toward goals  Not progressing toward goals (comment)   decreased awareness      SLP Education - 04/13/19 1826    Education Details  refrain from "drilling" or "quizzing" patient, types of questions to ask to elicit description    Person(s) Educated  Patient;Spouse    Methods   Explanation;Demonstration;Verbal cues    Comprehension  Verbal cues required;Verbalized understanding       SLP Short Term Goals - 04/13/19 1832      SLP SHORT TERM GOAL #1   Title  Pt will demo awareness of 50% of errors in a functional task with usual mod A (verbal/written cues).     Status  Partially Met      SLP SHORT TERM GOAL #2   Title  Pt will communicate preferences or needs (food, activities, medical etc) x 3 sessions with usual mod A (AAC if necessary).    Baseline  11-25-18 (vegetables), 12-12-18 (fruits, vegetables, tv dinners)    Status  Partially Met      SLP SHORT TERM GOAL #3   Title  Pt will communicate personal details (medical history, family, name, age, etc) with usual mod A x 3 sessions (AAC if necessary)    Baseline  pain level 12-12-18, 12/19/18    Status  Partially Met      SLP SHORT TERM GOAL #4   Title  When given verbal or written cues of speech errors, pt will attempt to rephrase or use AAC to clarify her message with usual mod A x 3 sessions.    Time  3    Period  Weeks    Status  On-going      SLP SHORT TERM GOAL #5   Title  Pt will use AAC to communicate a request (food, assistance, clothing) outside of ST session with assistance from spouse (per pt/spouse report).    Time  3    Period  Weeks    Status  On-going       SLP Long Term Goals - 04/13/19 1833      SLP LONG TERM GOAL #1   Title  Pt will attempt error correction in 3/5 opportunities when cued appropriately by communication partner x 3 sessions.    Baseline  word level 12/27/18    Time  6   renewed 03/21/19   Period  Weeks    Status  On-going      SLP LONG TERM GOAL #2   Title  Family will demo understanding of at least 4 appropriate ways to enhance pt's comprehension/expression during 4 sessions.     Time  6   renewed 03/21/19   Period  Weeks    Status  On-going      SLP LONG TERM GOAL #3   Title  Pt will reduce social isolation risks by communicating social introductions, common  social messages, and personal interests using multimodal communication with occasional min A from communication partner over 4 sessions.    Time  4    Period  Weeks    Status  Deferred      SLP LONG TERM GOAL #4   Title  Pt/family will  report improvement in understanding of pt's needs and requests than prior to ST.    Time  6   renewed 03/21/19   Period  Weeks    Status  On-going       Plan - 04/13/19 1828    Clinical Impression Statement  Patient continues with severe expressive aphasia, with expressive > receptive deficits. When using low tech visual supports (map), pt generated brief phrases re: places she has lived and traveled, though remains largely unaware of verbal errors (approx 50% of the time). Husband did not bring photo album today. Pt/husband both report frustration with frequent communication breakdowns at home; pt easily agitated by requests for clarification. Pt awareness limiting progress; SLP explained today that we may need to consider a break in therapy if willingness to use compensations does not improve. I recommend cont'd skilled ST to address incr'd pt awareness of verbal errors, to maximize communication and to train communication partner for pt use of AAC for wants/needs, safety, independence and QOL.    Speech Therapy Frequency  1x /week       Patient will benefit from skilled therapeutic intervention in order to improve the following deficits and impairments:   1. Aphasia   2. Cognitive communication deficit       Problem List Patient Active Problem List   Diagnosis Date Noted  . Cough 03/21/2019  . Expressive aphasia 11/17/2018  . Gait disturbance, post-stroke 11/17/2018  . Hemiparesis affecting dominant side as late effect of cerebrovascular accident ([place]) 11/17/2018  . Aphasia, post-stroke 11/17/2018  . Aphasia as late effect of stroke 11/08/2018  . 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 Left middle cerebral artery stroke ([place]) 10/06/2018  .  Dyslipidemia   . Diabetes mellitus type 2 in nonobese ([facility])   . Atrial fibrillation ([place])   . Tachypnea   . Stage 3 chronic kidney disease ([place])   . Stroke [facility]) ischemic embolic L MCA  d/t AF 40/81/4481  . 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 Frequent falls 10/13/2017  . Vitamin D deficiency 04/22/2017  . Slurring of speech 03/31/2017  . Back pain 09/14/2016  . Compression fracture of thoracic vertebra ([facility]) 09/14/2016  . S/P vertebroplasty 09/14/2016  . Chronic diastolic heart failure ([place]) 09/14/2016  . HTN (hypertension) 09/14/2016  . History of CVA (cerebrovascular accident) 09/14/2016  . Hypokalemia 09/11/2016  . Paraspinal hematoma 09/11/2016  . Osteoporosis 05/27/2016  . Hypothyroidism 09/11/2014  . Persistent atrial fibrillation ([place]) 12/15/2012  . HLD (hyperlipidemia) 03/27/2011   Deneise Lever, [place], [place] 04/13/2019, 6:34 PM  [facility] [address] [place], [place], [address] Phone: [phone]   Fax:  [phone]   Name: Bettina Warn MRN: [id number] Date of Birth: 20-Mar-1930

## 2019-04-19 DIAGNOSIS — M549 Dorsalgia, unspecified: Secondary | ICD-10-CM | POA: Diagnosis not present

## 2019-04-19 DIAGNOSIS — I5032 Chronic diastolic (congestive) heart failure: Secondary | ICD-10-CM | POA: Diagnosis not present

## 2019-04-19 DIAGNOSIS — I69998 Other sequelae following unspecified cerebrovascular disease: Secondary | ICD-10-CM | POA: Diagnosis not present

## 2019-04-19 DIAGNOSIS — E039 Hypothyroidism, unspecified: Secondary | ICD-10-CM | POA: Diagnosis not present

## 2019-04-19 DIAGNOSIS — I679 Cerebrovascular disease, unspecified: Secondary | ICD-10-CM | POA: Diagnosis not present

## 2019-04-19 DIAGNOSIS — I480 Paroxysmal atrial fibrillation: Secondary | ICD-10-CM | POA: Diagnosis not present

## 2019-04-19 DIAGNOSIS — I10 Essential (primary) hypertension: Secondary | ICD-10-CM | POA: Diagnosis not present

## 2019-04-19 DIAGNOSIS — M810 Age-related osteoporosis without current pathological fracture: Secondary | ICD-10-CM | POA: Diagnosis not present

## 2019-04-19 DIAGNOSIS — E7800 Pure hypercholesterolemia, unspecified: Secondary | ICD-10-CM | POA: Diagnosis not present

## 2019-04-19 DIAGNOSIS — Z7901 Long term (current) use of anticoagulants: Secondary | ICD-10-CM | POA: Diagnosis not present

## 2019-04-20 ENCOUNTER — Other Ambulatory Visit: Payer: Self-pay

## 2019-04-20 ENCOUNTER — Ambulatory Visit: Payer: Medicare Other | Attending: Physical Medicine & Rehabilitation | Admitting: Speech Pathology

## 2019-04-20 DIAGNOSIS — R2689 Other abnormalities of gait and mobility: Secondary | ICD-10-CM | POA: Insufficient documentation

## 2019-04-20 DIAGNOSIS — R4701 Aphasia: Secondary | ICD-10-CM

## 2019-04-20 DIAGNOSIS — I69351 Hemiplegia and hemiparesis following cerebral infarction affecting right dominant side: Secondary | ICD-10-CM | POA: Diagnosis not present

## 2019-04-20 DIAGNOSIS — M545 Low back pain: Secondary | ICD-10-CM | POA: Insufficient documentation

## 2019-04-20 DIAGNOSIS — R41841 Cognitive communication deficit: Secondary | ICD-10-CM | POA: Diagnosis not present

## 2019-04-20 DIAGNOSIS — G8929 Other chronic pain: Secondary | ICD-10-CM | POA: Insufficient documentation

## 2019-04-20 DIAGNOSIS — R2681 Unsteadiness on feet: Secondary | ICD-10-CM | POA: Insufficient documentation

## 2019-04-20 DIAGNOSIS — R293 Abnormal posture: Secondary | ICD-10-CM | POA: Diagnosis not present

## 2019-04-20 DIAGNOSIS — M6281 Muscle weakness (generalized): Secondary | ICD-10-CM | POA: Diagnosis not present

## 2019-04-20 NOTE — Therapy (Signed)
[facility] [address] [place], [place], [address] Phone: [phone]   Fax:  [phone]  Speech Language Pathology Treatment  Patient Details  Name: [name] MRN: [id number] Date of Birth: April 07, 1930 Referring Provider (SLP): Dr. Letta Pate   Encounter Date: 04/20/2019  End of Session - 04/20/19 1608    Visit Number  22    Number of Visits  26    Date for SLP Re-Evaluation  06/19/19    Authorization Type  MCR    Authorization Time Period  90 days    SLP Start Time  1408    SLP Stop Time   1448    SLP Time Calculation (min)  40 min    Activity Tolerance  Patient tolerated treatment well       Past Medical History:  Diagnosis Date  . Atrial fibrillation ([place])   . Chronic anticoagulation   . Chronic anticoagulation   . CVA (cerebrovascular accident) ([place])   . Diabetes mellitus    pt & husband deny any knowledge of this   . History of chicken pox   . Hyperlipidemia   . Hypertension     Past Surgical History:  Procedure Laterality Date  . APPENDECTOMY    . BACK SURGERY    . CARDIAC CATHETERIZATION  11/09/91   EF 70%  . DILATION AND CURETTAGE OF UTERUS    . IR PERCUTANEOUS ART THROMBECTOMY/INFUSION INTRACRANIAL INC DIAG ANGIO  10/03/2018  . RADIOLOGY WITH ANESTHESIA N/A 10/03/2018   Procedure: IR WITH ANESTHESIA;  Surgeon: Radiologist, Medication, MD;  Location: [facility];  Service: Radiology;  Laterality: N/A;    There were no vitals filed for this visit.  Subjective Assessment - 04/20/19 1404    Subjective  "Not good."    Patient is accompained by:  Family member    Pain Score  8     Pain Location  Back            ADULT SLP TREATMENT - 04/20/19 1606      General Information   Behavior/Cognition  Alert;Cooperative;Requires cueing      Treatment Provided   Treatment provided  Cognitive-Linquistic      Cognitive-Linquistic Treatment   Treatment focused on  Aphasia;Apraxia     Skilled Treatment  Pt, husband arrived with photo album, no new photos added. Husband brought Conservator, museum/gallery from [employer], "I thought she could point and practice saying things." When using photo album with pt, husband repeatedly asking "Who's that? What's their name?" despite SLP educating very directly both today and in every session for the last several visits that this is not likely to be functional for pt. Husband reports pt gets "mad and frustrated," when he doesn't understand her at home. SLP has requested husband make a list of housekeeping tasks pt likes for him to do so that SLP can create visual supports to decrease frustration at home, however he reports he has not had time. In session today, pt was easily frustrated and error awareness was poor. Husband brought up [employer] and SLP explained that pt's awareness of need/ willingness to use the device in communication breakdowns means device is not likely to benefit her at this time. Husband's primary concern continues to be "having her say the words," despite repeated education that this is not likely to be functional for her. He also asked about "a computer that would talk for her, like Jeralyn Bennett" and SLP explained that pt's language, not her  speech/voice are impaired and that such a device would not assist pt with her language. Education provided re: need to see progress in order to warrant continued therapy, and that break from therapy advised at this time. SLP provided functional task for home with grocery flyer (see pt instructions) and clear instructions to pt/husband about what SLP will be looking for if we are to resume therapy when she returns (improved awareness and/or willingness to use compensations or alternative means of communication when breakdowns occur).       Assessment / Recommendations / Plan   Plan  Other (Comment)   hold for 4 weeks     Progression Toward Goals   Progression toward goals  Not progressing toward  goals (comment)   decreased awareness/willingness to use compensations      SLP Education - 04/20/19 1607    Education Details  refrain from "drilling" or "quizzing" patient, need to see progress in order to continue therapy, one month break advised    Person(s) Educated  Patient;Spouse    Methods  Explanation;Handout;Verbal cues;Demonstration    Comprehension  Verbalized understanding;Need further instruction;Verbal cues required       SLP Short Term Goals - 04/20/19 1609      SLP SHORT TERM GOAL #1   Title  Pt will demo awareness of 50% of errors in a functional task with usual mod A (verbal/written cues).     Status  Partially Met      SLP SHORT TERM GOAL #2   Title  Pt will communicate preferences or needs (food, activities, medical etc) x 3 sessions with usual mod A (AAC if necessary).    Baseline  11-25-18 (vegetables), 12-12-18 (fruits, vegetables, tv dinners)    Status  Partially Met      SLP SHORT TERM GOAL #3   Title  Pt will communicate personal details (medical history, family, name, age, etc) with usual mod A x 3 sessions (AAC if necessary)    Baseline  pain level 12-12-18, 12/19/18    Status  Partially Met      SLP SHORT TERM GOAL #4   Title  When given verbal or written cues of speech errors, pt will attempt to rephrase or use AAC to clarify her message with usual mod A x 3 sessions.    Time  1    Period  Weeks    Status  Not Met      SLP SHORT TERM GOAL #5   Title  Pt will use AAC to communicate a request (food, assistance, clothing) outside of ST session with assistance from spouse (per pt/spouse report).    Time  1    Period  Weeks    Status  Not Met       SLP Long Term Goals - 04/20/19 1611      SLP LONG TERM GOAL #1   Title  Pt will attempt error correction in 3/5 opportunities when cued appropriately by communication partner x 3 sessions.    Baseline  word level 12/27/18    Time  5   renewed 03/21/19   Period  Weeks    Status  On-going      SLP LONG  TERM GOAL #2   Title  Family will demo understanding of at least 4 appropriate ways to enhance pt's comprehension/expression during 4 sessions.     Time  5   renewed 03/21/19   Period  Weeks    Status  On-going      SLP  LONG TERM GOAL #3   Title  Pt will reduce social isolation risks by communicating social introductions, common social messages, and personal interests using multimodal communication with occasional min A from communication partner over 4 sessions.    Time  4    Period  Weeks    Status  Deferred      SLP LONG TERM GOAL #4   Title  Pt/family will report improvement in understanding of pt's needs and requests than prior to ST.    Time  5   renewed 03/21/19   Period  Weeks    Status  On-going       Plan - 04/20/19 1658    Clinical Impression Statement  Patient continues with severe expressive aphasia, with expressive > receptive deficits. Pt communication and response to cues to use compensations commensurate with previous sessions. Husband continues to cue pt to "say the word, honey," despite SLP modeling and explicit instruction to avoid this. Pt/husband both report frustration with frequent communication breakdowns at home; pt easily agitated by requests for clarification. Pt awareness limiting progress; will hold therapy for 4 weeks, pt to return in early August to determine if appropriate to resume therapy or d/c if awareness/willingness to use compensations improves. I recommend cont'd skilled ST to address incr'd pt awareness of verbal errors, to maximize communication and to train communication partner for pt use of AAC for wants/needs, safety, independence and QOL.    Speech Therapy Frequency  --   hold for 4 weeks   Treatment/Interventions  Cognitive reorganization;Multimodal communcation approach;Compensatory strategies;Language facilitation;Compensatory techniques;Cueing hierarchy;Internal/external aids;Functional tasks;SLP instruction and feedback;Patient/family  education    Potential to Achieve Goals  Fair    Potential Considerations  Cooperation/participation level;Severity of impairments;Ability to learn/carryover information    Consulted and Agree with Plan of Care  Patient;Family member/caregiver    Family Member Consulted  Husband [name]       Patient will benefit from skilled therapeutic intervention in order to improve the following deficits and impairments:   No diagnosis found.    Problem List Patient Active Problem List   Diagnosis Date Noted  . Cough 03/21/2019  . Expressive aphasia 11/17/2018  . Gait disturbance, post-stroke 11/17/2018  . Hemiparesis affecting dominant side as late effect of cerebrovascular accident ([place]) 11/17/2018  . Aphasia, post-stroke 11/17/2018  . Aphasia as late effect of stroke 11/08/2018  . 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 Left middle cerebral artery stroke ([place]) 10/06/2018  . Dyslipidemia   . Diabetes mellitus type 2 in nonobese ([facility])   . Atrial fibrillation ([place])   . Tachypnea   . Stage 3 chronic kidney disease ([place])   . Stroke [facility]) ischemic embolic L MCA  d/t AF 32/54/9826  . 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 Frequent falls 10/13/2017  . Vitamin D deficiency 04/22/2017  . Slurring of speech 03/31/2017  . Back pain 09/14/2016  . Compression fracture of thoracic vertebra ([facility]) 09/14/2016  . S/P vertebroplasty 09/14/2016  . Chronic diastolic heart failure ([place]) 09/14/2016  . HTN (hypertension) 09/14/2016  . History of CVA (cerebrovascular accident) 09/14/2016  . Hypokalemia 09/11/2016  . Paraspinal hematoma 09/11/2016  . Osteoporosis 05/27/2016  . Hypothyroidism 09/11/2014  . Persistent atrial fibrillation ([place]) 12/15/2012  . HLD (hyperlipidemia) 03/27/2011   Deneise Lever, [place], [place] 04/20/2019, [facility] [address] [place], [place],  [address] Phone: [phone]   Fax:  [phone]  Name: [name] MRN: [id number] Date of Birth: 11-26-1929

## 2019-04-20 NOTE — Patient Instructions (Signed)
   We are going to take a one month break from therapy. Schedule 1 appointment with Olean Ree in early August. Olean Ree will reassess Chalisa at that time and we will determine if it is appropriate to continue therapy. In order to continue therapy, we will need to see:  1) Better awareness of when people don't understand her speech or when her speech comes out wrong 2) A positive response or willingness to try another way to communicate if people don't understand.   Try this at home: Every time you're going to the grocery store, make a list before you go. Ask Jasmane what she would like you to buy. Write down the things you understand her say on your list. If there is something you don't understand, have her point to things she wants in the sales flyer. You can also try taking some photos on your phone of things you buy frequently and have her show you what she wants.

## 2019-04-25 ENCOUNTER — Encounter: Payer: Self-pay | Admitting: Physical Therapy

## 2019-04-25 ENCOUNTER — Ambulatory Visit: Payer: Medicare Other | Admitting: Physical Therapy

## 2019-04-25 ENCOUNTER — Other Ambulatory Visit: Payer: Self-pay

## 2019-04-25 DIAGNOSIS — R4701 Aphasia: Secondary | ICD-10-CM | POA: Diagnosis not present

## 2019-04-25 DIAGNOSIS — R2681 Unsteadiness on feet: Secondary | ICD-10-CM | POA: Diagnosis not present

## 2019-04-25 DIAGNOSIS — R293 Abnormal posture: Secondary | ICD-10-CM

## 2019-04-25 DIAGNOSIS — I69351 Hemiplegia and hemiparesis following cerebral infarction affecting right dominant side: Secondary | ICD-10-CM

## 2019-04-25 DIAGNOSIS — R2689 Other abnormalities of gait and mobility: Secondary | ICD-10-CM | POA: Diagnosis not present

## 2019-04-25 DIAGNOSIS — R41841 Cognitive communication deficit: Secondary | ICD-10-CM | POA: Diagnosis not present

## 2019-04-25 DIAGNOSIS — G8929 Other chronic pain: Secondary | ICD-10-CM

## 2019-04-25 DIAGNOSIS — M6281 Muscle weakness (generalized): Secondary | ICD-10-CM | POA: Diagnosis not present

## 2019-04-26 NOTE — Therapy (Signed)
[facility] [address] [place], [place], [address] Phone: [phone]   Fax:  [phone]  Physical Therapy Treatment & Reassessment  Patient Details  Name: [name] MRN: [id number] Date of Birth: 15-Aug-1930 Referring Provider (PT): Alysia Penna, MD   Encounter Date: 04/25/2019   CLINIC OPERATION CHANGES: Outpatient [facility] Re[facility] is open at lower capacity following universal masking, social distancing, and patient screening.  The patient's COVID risk of complications score is 6.   PT End of Session - 04/26/19 0921    Visit Number  11    Number of Visits  15    Authorization Type  Medicare & AARP    Authorization Time Period  100% covered    PT Start Time  1355    PT Stop Time  1445    PT Time Calculation (min)  50 min    Equipment Utilized During Treatment  Gait belt    Activity Tolerance  Patient tolerated treatment well;No increased pain    Behavior During Therapy  WFL for tasks assessed/performed       Past Medical History:  Diagnosis Date  . Atrial fibrillation ([place])   . Chronic anticoagulation   . Chronic anticoagulation   . CVA (cerebrovascular accident) ([place])   . Diabetes mellitus    pt & husband deny any knowledge of this   . History of chicken pox   . Hyperlipidemia   . Hypertension     Past Surgical History:  Procedure Laterality Date  . APPENDECTOMY    . BACK SURGERY    . CARDIAC CATHETERIZATION  11/09/91   EF 70%  . DILATION AND CURETTAGE OF UTERUS    . IR PERCUTANEOUS ART THROMBECTOMY/INFUSION INTRACRANIAL INC DIAG ANGIO  10/03/2018  . RADIOLOGY WITH ANESTHESIA N/A 10/03/2018   Procedure: IR WITH ANESTHESIA;  Surgeon: Radiologist, Medication, MD;  Location: [facility];  Service: Radiology;  Laterality: N/A;    There were no vitals filed for this visit.  Subjective Assessment - 04/25/19 1356    Subjective  Patient was placed on hold during [place] pandemic. Husband wants to  restart PT to get strength, balance & mobility built back up.    Patient is accompained by:  Family member   husband   Pertinent History  HTN, DM, CVA 84yr ago, A-Fib, chronic LBP    Patient Stated Goals  to get stronger, improve balance & mobility    Currently in Pain?  Yes    Pain Score  8     Pain Location  Back    Pain Orientation  Right;Left    Pain Descriptors / Indicators  Aching    Pain Type  Chronic pain    Pain Radiating Towards  into buttocks & lateral thighs    Pain Onset  More than a month ago    Pain Frequency  Constant    Aggravating Factors   sitting too long then starting to move.    Pain Relieving Factors  lay down or sit in reclliner         [facility] - 04/25/19 1400      Assessment   Medical Diagnosis  L MCA CVA    Referring Provider (PT)  AAlysia Penna MD    Onset Date/Surgical Date  10/03/18    Hand Dominance  Right      Precautions   Precautions  Fall    Precaution Comments  no driving      Balance Screen  Has the patient fallen in the past 6 months  Yes    How many times?  2   1st L injured shoulder & 2nd left wrist   Has the patient had a decrease in activity level because of a fear of falling?   Yes    Is the patient reluctant to leave their home because of a fear of falling?   Yes      [place]  Private residence    Living Arrangements  Spouse/significant other    Type of [place] to enter    Entrance Stairs-Number of Steps  2 + 1    Entrance Stairs-Rails  None    Home Layout  One level    [place] [facility] - 2 wheels;Cane - single point;Bedside commode;Shower seat      Prior Function   Level of Independence  Independent;Independent with basic ADLs;Independent with community mobility without device    Vocation  Retired      Art therapist   Posture/Postural Control  Postural limitations    Postural Limitations  Rounded Shoulders;Forward head       Ambulation/Gait   Gait velocity  1.94 ft/sec      Standardized Balance Assessment   Standardized Balance Assessment  Berg Balance Test;Timed Up and Go Test      Berg Balance Test   Sit to Stand  Able to stand  independently using hands    Standing Unsupported  Able to stand 30 seconds unsupported    Sitting with Back Unsupported but Feet Supported on Floor or Stool  Able to sit safely and securely 2 minutes    Stand to Sit  Controls descent by using hands    Transfers  Able to transfer safely, definite need of hands    Standing Unsupported with Eyes Closed  Able to stand 10 seconds with supervision    Standing Unsupported with Feet Together  Needs help to attain position but able to stand for 30 seconds with feet together    From Standing, Reach Forward with Outstretched Arm  Reaches forward but needs supervision    From Standing Position, Pick up Object from Floor  Unable to pick up shoe, but reaches 2-5 cm (1-2") from shoe and balances independently    From Standing Position, Turn to Look Behind Over each Shoulder  Turn sideways only but maintains balance    Turn 360 Degrees  Needs assistance while turning    Standing Unsupported, Alternately Place Feet on Step/Stool  Needs assistance to keep from falling or unable to try    Standing Unsupported, One Foot in [employer] balance while stepping or standing    Standing on One Leg  Unable to try or needs assist to prevent fall    Total Score  24      Timed Up and Go Test   Normal TUG (seconds)  15   mon guard for safety                  [facility] Adult PT Treatment/Exercise - 04/25/19 1400      Transfers   Transfers  Sit to Stand;Stand to Sit    Sit to Stand  5: Supervision;Without upper extremity assist;From chair/3-in-1    Stand to Sit  5: Supervision;Without upper extremity assist;To chair/3-in-1    Stand Pivot Transfers  5: Supervision      Ambulation/Gait   Ambulation/Gait  Yes  Ambulation/Gait Assistance  4: Min  assist    Ambulation Distance (Feet)  300 Feet   300' X 2   Assistive device  1 person hand held assist    Gait Pattern  Step-through pattern;Antalgic    Ambulation Surface  Indoor;Level;Outdoor;Gravel;Grass;Paved    Door Management  4: Min assist   HHA   Ramp  4: Min assist   HHA   Curb  4: Min assist   HHA     Standardized Balance Assessment   Standardized Balance Assessment  Berg Balance Test;Timed Up and Go Test               PT Short Term Goals - 11/22/18 1452      PT SHORT TERM GOAL #1   Title  Patient's husband verbalizes fall risk prevention strategies. (All STGs Target Date: 11/25/2018)    Baseline  11/22/18: met per spouse report.     Status  Achieved      PT SHORT TERM GOAL #2   Title  patient ambulates 100' around furniture with RW with supervision.     Baseline  11/22/18: met today    Time  --    Period  --    Status  Achieved      PT SHORT TERM GOAL #3   Title  Patient sit to/from stand from chairs with armrests without touching external support to stabilize with supervision.     Baseline  11/22/18: met today    Time  --    Period  --    Status  Achieved      PT SHORT TERM GOAL #4   Title  Patient & husband demonstrate understanding of initial HEP.     Baseline  11/22/18: met with current HEP    Status  Achieved        PT Long Term Goals - 04/25/19 1913      PT LONG TERM GOAL #1   Title  Husband demonstrates proper hand hold assistance for standing & gait activities and fall prevention strategies.  (All LTGs Target Date: 06/12/2019)    Time  4    Period  Weeks    Status  Revised    Target Date  06/12/19      PT LONG TERM GOAL #2   Title  Husband verbalizes safe supervision level for standing activities.    Time  4    Period  Weeks    Status  New    Target Date  06/12/19      PT LONG TERM GOAL #3   Title  Timed Up & Go with supervision <13.5 seconds    Time  4    Period  Weeks    Status  Revised    Target Date  06/12/19      PT LONG TERM  GOAL #4   Title  Patient ambulates 300' outdoors on paved, gravel & grass surfaces with husband's hand hold assist safely.    Time  4    Period  Weeks    Status  Revised    Target Date  06/12/19      PT LONG TERM GOAL #5   Title  Patient negotiates ramps, curbs & 2 steps similar to home entrance with husband's hand hold assist safely.    Time  4    Period  Weeks    Status  Revised    Target Date  06/12/19      PT LONG TERM GOAL #6  Title  Husband & patient demonstrate & verbalize understanding of HEP    Time  4    Period  Weeks    Status  New    Target Date  06/12/19            Plan - 04/25/19 1506    Clinical Impression Statement  Patient returned to PT for reassessment following 3.5 month hold with COVID pandemic concerns. Patient's Berg Balance score improved from 20/56 to 24/56. Timed Up & Go with contact guard improved to 15.00 sec.  Patient ambulated with PT hand hold assist without device including outdoors on gravel & grass, ramps & curbs. Patient would probably be more active / less sedentary with gait with husband without walker. He will need skilled PT training to be able to perform safely. See LTGs.    Rehab Potential  Good    PT Frequency  1x / week    PT Duration  4 weeks    PT Treatment/Interventions  ADLs/Self Care Home Management;DME Instruction;Gait training;Stair training;Functional mobility training;Therapeutic activities;Therapeutic exercise;Balance training;Neuromuscular re-education;Patient/family education;Vestibular;Cryotherapy;Manual techniques    PT Next Visit Plan  Instruct patient & husband in gait with hand hold assist. work towards updated [place] and Agree with Plan of Care  Patient;Family member/caregiver    Family Member Consulted  husband, Nelson Chimes       Patient will benefit from skilled therapeutic intervention in order to improve the following deficits and impairments:  Abnormal gait,  Decreased activity tolerance, Decreased balance, Decreased cognition, Decreased coordination, Decreased endurance, Decreased knowledge of use of DME, Decreased mobility, Decreased strength, Dizziness, Postural dysfunction, Pain  Visit Diagnosis: 1. Muscle weakness (generalized)   2. Other abnormalities of gait and mobility   3. Hemiplegia and hemiparesis following cerebral infarction affecting right dominant side ([place])   4. Unsteadiness on feet   5. Abnormal posture   6. Chronic bilateral low back pain without sciatica        Problem List Patient Active Problem List   Diagnosis Date Noted  . Cough 03/21/2019  . Expressive aphasia 11/17/2018  . Gait disturbance, post-stroke 11/17/2018  . Hemiparesis affecting dominant side as late effect of cerebrovascular accident ([place]) 11/17/2018  . Aphasia, post-stroke 11/17/2018  . Aphasia as late effect of stroke 11/08/2018  . 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 Left middle cerebral artery stroke ([place]) 10/06/2018  . Dyslipidemia   . Diabetes mellitus type 2 in nonobese ([facility])   . Atrial fibrillation ([place])   . Tachypnea   . Stage 3 chronic kidney disease ([place])   . Stroke [facility]) ischemic embolic L MCA  d/t AF 40/98/1191  . 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 Frequent falls 10/13/2017  . Vitamin D deficiency 04/22/2017  . Slurring of speech 03/31/2017  . Back pain 09/14/2016  . Compression fracture of thoracic vertebra (H[facility]) 09/14/2016  . S/P vertebroplasty 09/14/2016  . Chronic diastolic heart failure ([place]) 09/14/2016  . HTN (hypertension) 09/14/2016  . History of CVA (cerebrovascular accident) 09/14/2016  . Hypokalemia 09/11/2016  . Paraspinal hematoma 09/11/2016  . Osteoporosis 05/27/2016  . Hypothyroidism 09/11/2014  . Persistent atrial fibrillation ([place]) 12/15/2012  . HLD (hyperlipidemia) 03/27/2011    Jamey Reas PT, DPT 04/26/2019, 3:12 PM  [facility] [address] [place], [place], [address] Phone: [phone]   Fax:  [phone]  Name: [name] MRN: [id number] Date of Birth: 06/12/1930

## 2019-04-28 ENCOUNTER — Other Ambulatory Visit: Payer: Medicare Other

## 2019-04-28 ENCOUNTER — Telehealth: Payer: Self-pay | Admitting: Neurology

## 2019-04-28 ENCOUNTER — Other Ambulatory Visit: Payer: Self-pay

## 2019-04-28 ENCOUNTER — Ambulatory Visit: Admission: RE | Admit: 2019-04-28 | Payer: Medicare Other | Source: Ambulatory Visit

## 2019-04-28 NOTE — Telephone Encounter (Signed)
[facility] called about this patient having a test today for unknown reasons?  Line is busy. Question unintelligible: " unkown with issue with a test that was ordered due to elevated labs" .  Venancio Poisson sent her for CT angiogram according to [facility] at [place]. Creatinine is too high. Patient is already  in [facility] waiting room and the radiologist has cancelled the test to to low GFR.   Janett Billow, please call patientback on Monday- patient will go home now. I suggested MRA - non contrast study by ut that woud need to be rescheduled.

## 2019-05-01 ENCOUNTER — Other Ambulatory Visit: Payer: Self-pay | Admitting: Cardiovascular Disease

## 2019-05-02 NOTE — Telephone Encounter (Signed)
CT head ordered to assess for potential acute stroke due to worsening cognition/memory and worsening speech over the past few months. CTA ordered for evaluation of prior M2 occlusion with unsuccessful thrombectomy. Will hold off on vessel imaging at this time - would still recommend obtaining CT wo contrast.

## 2019-05-02 NOTE — Telephone Encounter (Signed)
Ask [name] to address

## 2019-05-02 NOTE — Telephone Encounter (Signed)
I called and spoke to husband. After speaking to [place], in authorization, and Neoma Laming at [facility],  [employer], proceed with CT HEAD WO Contrast.  May proceed with other testing if needed after this done.  I relayed to husband of pt.  The test can be scheduled (gave [phone]) or may walk in.  He asked about MRI, I stated due to pts visit and exam she ordered this test for pt.  He will call and get scheduled.   Per Neoma Laming at [employer] no new order needed.

## 2019-05-09 ENCOUNTER — Other Ambulatory Visit: Payer: Medicare Other

## 2019-05-10 ENCOUNTER — Ambulatory Visit
Admission: RE | Admit: 2019-05-10 | Discharge: 2019-05-10 | Disposition: A | Discharge status: Home / Self Care | Payer: Medicare Other | Source: Ambulatory Visit | Attending: Adult Health | Admitting: Adult Health

## 2019-05-10 DIAGNOSIS — R29818 Other symptoms and signs involving the nervous system: Secondary | ICD-10-CM | POA: Diagnosis not present

## 2019-05-11 ENCOUNTER — Telehealth: Payer: Self-pay | Admitting: *Deleted

## 2019-05-11 ENCOUNTER — Ambulatory Visit: Payer: Medicare Other | Admitting: Physical Therapy

## 2019-05-11 NOTE — Telephone Encounter (Signed)
Called and spoke with husband, on DPR and informed him that patient's recent imaging did not show evidence of acute abnormalities, nothing new was found when compared to previous scan. He verbalized understanding, appreciation.

## 2019-05-23 ENCOUNTER — Ambulatory Visit: Payer: Medicare Other | Attending: Physical Medicine & Rehabilitation | Admitting: Speech Pathology

## 2019-05-23 ENCOUNTER — Other Ambulatory Visit: Payer: Self-pay

## 2019-05-23 DIAGNOSIS — M6281 Muscle weakness (generalized): Secondary | ICD-10-CM | POA: Insufficient documentation

## 2019-05-23 DIAGNOSIS — R293 Abnormal posture: Secondary | ICD-10-CM | POA: Diagnosis not present

## 2019-05-23 DIAGNOSIS — R2689 Other abnormalities of gait and mobility: Secondary | ICD-10-CM | POA: Diagnosis not present

## 2019-05-23 DIAGNOSIS — I69351 Hemiplegia and hemiparesis following cerebral infarction affecting right dominant side: Secondary | ICD-10-CM | POA: Diagnosis not present

## 2019-05-23 DIAGNOSIS — R482 Apraxia: Secondary | ICD-10-CM | POA: Diagnosis not present

## 2019-05-23 DIAGNOSIS — R4701 Aphasia: Secondary | ICD-10-CM | POA: Insufficient documentation

## 2019-05-23 DIAGNOSIS — R41841 Cognitive communication deficit: Secondary | ICD-10-CM | POA: Diagnosis not present

## 2019-05-23 DIAGNOSIS — R2681 Unsteadiness on feet: Secondary | ICD-10-CM | POA: Insufficient documentation

## 2019-05-23 NOTE — Patient Instructions (Signed)
Play the guessing game! If Krisann tries to tell you something she wants, and you don't understand, try this:   1. "I don't think I understand. What I heard was _________. Did you mean ________?"  2. "Tell me more about it."  3. "Where can I find it?" "What does it look like?" "What size/shape/color is it?" "What goes with it?" "What does it do?" "What is it used for?"

## 2019-05-23 NOTE — Therapy (Signed)
[facility] [address] [place], [place], [address] Phone: [phone]   Fax:  [phone]  Speech Language Pathology Treatment  Patient Details  Name: [name] MRN: [id number] Date of Birth: May 10, 1930 Referring Provider (SLP): Dr. Letta Pate   Encounter Date: 05/23/2019  End of Session - 05/23/19 1841    Visit Number  23    Number of Visits  26    Date for SLP Re-Evaluation  06/19/19    Authorization Type  MCR    Authorization Time Period  90 days    SLP Start Time  1445    SLP Stop Time   1533    SLP Time Calculation (min)  48 min    Activity Tolerance  Patient tolerated treatment well       Past Medical History:  Diagnosis Date  . Atrial fibrillation ([place])   . Chronic anticoagulation   . Chronic anticoagulation   . CVA (cerebrovascular accident) ([place])   . Diabetes mellitus    pt & husband deny any knowledge of this   . History of chicken pox   . Hyperlipidemia   . Hypertension     Past Surgical History:  Procedure Laterality Date  . APPENDECTOMY    . BACK SURGERY    . CARDIAC CATHETERIZATION  11/09/91   EF 70%  . DILATION AND CURETTAGE OF UTERUS    . IR PERCUTANEOUS ART THROMBECTOMY/INFUSION INTRACRANIAL INC DIAG ANGIO  10/03/2018  . RADIOLOGY WITH ANESTHESIA N/A 10/03/2018   Procedure: IR WITH ANESTHESIA;  Surgeon: Radiologist, Medication, MD;  Location: [facility];  Service: Radiology;  Laterality: N/A;    There were no vitals filed for this visit.         ADULT SLP TREATMENT - 05/23/19 1840      General Information   Behavior/Cognition  Alert;Cooperative;Requires cueing      Treatment Provided   Treatment provided  Cognitive-Linquistic      Pain Assessment   Pain Assessment  No/denies pain      Cognitive-Linquistic Treatment   Treatment focused on  Aphasia;Apraxia    Skilled Treatment  Pt's head CT negative for acute findings. Husband told SLP he remains puzzled about  "why she can say something one day and not the next." SLP again educated re: nature of aphasia/apraxia and that this problem is likely to persist for pt. Husband continues to report quizzing pt on names despite SLP's continual reinforcement of need to focus on quality of message received vs rote speech/pronunciation. SLP engaged pt/husband in "guessing game". SLP showed pt images on iPad of common objects, and had pt name nouns to husband. Husband repeated back what he understood pt say (eg "Air" instead of Owl). SLP also used white board to write pt's incorrect responses. Pt frequently chuckled in response to confrontation with her incorrect productions. She spontaneously initiated descriptions and gestures on several occasions eg. "Lives in a tree", though often her descriptions had semantic errors (eg, for tractor, "has big tanks" instead wheels). Both pt and spouse with positive affect during this game, and praised both of them for this. SLP modeled appropriate cuing/questions for spouse to ask pt if he does not understand something she says. Provided handout with script for spouse. Spouse asked about whether writing would be an effective means of communication for pt. SLP educated that given language deficits, writing is not a good alternative means of communication for pt. Husband wanted to see if pt could write her name, which  she did. SLP told pt and spouse recommendation is to focus on communicating effectively.       Assessment / Recommendations / Plan   Plan  Goals updated      Progression Toward Goals   Progression toward goals  Progressing toward goals         SLP Short Term Goals - 05/23/19 1844      SLP SHORT TERM GOAL #1   Title  Pt will demo awareness of 50% of errors in a functional task with usual mod A (verbal/written cues).     Status  Partially Met      SLP SHORT TERM GOAL #2   Title  Pt will communicate preferences or needs (food, activities, medical etc) x 3 sessions with  usual mod A (AAC if necessary).    Baseline  11-25-18 (vegetables), 12-12-18 (fruits, vegetables, tv dinners)    Status  Partially Met      SLP SHORT TERM GOAL #3   Title  Pt will communicate personal details (medical history, family, name, age, etc) with usual mod A x 3 sessions (AAC if necessary)    Baseline  pain level 12-12-18, 12/19/18    Status  Partially Met      SLP SHORT TERM GOAL #4   Title  When given verbal or written cues of speech errors, pt will attempt to rephrase or use AAC to clarify her message with usual mod A x 3 sessions.    Time  1    Period  Weeks    Status  Not Met      SLP SHORT TERM GOAL #5   Title  Pt will use AAC to communicate a request (food, assistance, clothing) outside of ST session with assistance from spouse (per pt/spouse report).    Time  1    Period  Weeks    Status  Not Met       SLP Long Term Goals - 05/23/19 1844      SLP LONG TERM GOAL #1   Title  Pt will attempt error correction in 3/5 opportunities when cued appropriately by communication partner x 3 sessions.    Baseline  word level 12/27/18    Time  5   renewed 03/21/19   Period  Weeks    Status  Deferred   focus on functional communication with pt/spouse     SLP LONG TERM GOAL #2   Title  Family will demo understanding of at least 4 appropriate ways to enhance pt's comprehension/expression during 4 sessions.     Time  4   renewed 03/21/19   Period  Weeks    Status  On-going      SLP LONG TERM GOAL #3   Title  Pt will reduce social isolation risks by communicating social introductions, common social messages, and personal interests using multimodal communication with occasional min A from communication partner over 4 sessions.    Time  4    Period  Weeks    Status  Deferred      SLP LONG TERM GOAL #4   Title  Pt/family will report improvement in understanding of pt's needs and requests than prior to ST.    Time  4   renewed 03/21/19   Period  Weeks    Status  On-going        Plan - 05/23/19 1841    Clinical Impression Statement  Patient continues with severe expressive aphasia, with expressive > receptive deficits. Pt reponse to  confrontation of errors was more positive than in previous sessions, and she demonstrated improved responsiveness to cues for description and gestures in word game played in session with husband today. Pt/husband both report frustration with frequent communication breakdowns at home; will resume sessions 1x per week with focus on functional communication strategies for improved communication of wants/needs between pt and spouse. I recommend cont'd skilled ST to address incr'd pt awareness of verbal errors, to maximize communication and to train communication partner for pt use of AAC for wants/needs, safety, independence and QOL.    Speech Therapy Frequency  1x /week    Duration  --   total of 26 visits   Treatment/Interventions  Cognitive reorganization;Multimodal communcation approach;Compensatory strategies;Language facilitation;Compensatory techniques;Cueing hierarchy;Internal/external aids;Functional tasks;SLP instruction and feedback;Patient/family education    Potential to Achieve Goals  Fair    Potential Considerations  Cooperation/participation level;Severity of impairments;Ability to learn/carryover information    Consulted and Agree with Plan of Care  Patient;Family member/caregiver    Family Member Consulted  Husband [name]       Patient will benefit from skilled therapeutic intervention in order to improve the following deficits and impairments:   1. Aphasia   2. Apraxia   3. Cognitive communication deficit       Problem List Patient Active Problem List   Diagnosis Date Noted  . Cough 03/21/2019  . Expressive aphasia 11/17/2018  . Gait disturbance, post-stroke 11/17/2018  . Hemiparesis affecting dominant side as late effect of cerebrovascular accident ([place]) 11/17/2018  . Aphasia, post-stroke 11/17/2018  . Aphasia as  late effect of stroke 11/08/2018  . 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 Left middle cerebral artery stroke ([place]) 10/06/2018  . Dyslipidemia   . Diabetes mellitus type 2 in nonobese (HCC)   . Atrial fibrillation ([place])   . Tachypnea   . Stage 3 chronic kidney disease ([place])   . Stroke [facility]) ischemic embolic L MCA  d/t AF 20/91/9802  . 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 Frequent falls 10/13/2017  . Vitamin D deficiency 04/22/2017  . Slurring of speech 03/31/2017  . Back pain 09/14/2016  . Compression fracture of thoracic vertebra (HCC) 09/14/2016  . S/P vertebroplasty 09/14/2016  . Chronic diastolic heart failure ([place]) 09/14/2016  . HTN (hypertension) 09/14/2016  . History of CVA (cerebrovascular accident) 09/14/2016  . Hypokalemia 09/11/2016  . Paraspinal hematoma 09/11/2016  . Osteoporosis 05/27/2016  . Hypothyroidism 09/11/2014  . Persistent atrial fibrillation ([place]) 12/15/2012  . HLD (hyperlipidemia) 03/27/2011   Deneise Lever, [place], [place] 05/23/2019, 6:45 PM  [facility] [address] [place], [place], [address] Phone: [phone]   Fax:  [phone]   Name: Shataya Winkles MRN: [id number] Date of Birth: June 05, 1930

## 2019-05-29 ENCOUNTER — Ambulatory Visit: Payer: Medicare Other | Admitting: Physical Therapy

## 2019-05-30 ENCOUNTER — Ambulatory Visit: Payer: Medicare Other | Admitting: Speech Pathology

## 2019-05-31 ENCOUNTER — Other Ambulatory Visit: Payer: Self-pay

## 2019-05-31 ENCOUNTER — Ambulatory Visit: Payer: Medicare Other | Admitting: Physical Therapy

## 2019-05-31 ENCOUNTER — Encounter: Payer: Self-pay | Admitting: Physical Therapy

## 2019-05-31 DIAGNOSIS — R2681 Unsteadiness on feet: Secondary | ICD-10-CM

## 2019-05-31 DIAGNOSIS — R4701 Aphasia: Secondary | ICD-10-CM | POA: Diagnosis not present

## 2019-05-31 DIAGNOSIS — R2689 Other abnormalities of gait and mobility: Secondary | ICD-10-CM

## 2019-05-31 DIAGNOSIS — R293 Abnormal posture: Secondary | ICD-10-CM

## 2019-05-31 DIAGNOSIS — R482 Apraxia: Secondary | ICD-10-CM | POA: Diagnosis not present

## 2019-05-31 DIAGNOSIS — M6281 Muscle weakness (generalized): Secondary | ICD-10-CM

## 2019-05-31 DIAGNOSIS — R41841 Cognitive communication deficit: Secondary | ICD-10-CM | POA: Diagnosis not present

## 2019-05-31 DIAGNOSIS — I69351 Hemiplegia and hemiparesis following cerebral infarction affecting right dominant side: Secondary | ICD-10-CM

## 2019-06-01 NOTE — Therapy (Signed)
[facility] [address] [place], [place], [address] Phone: [phone]   Fax:  [phone]  Physical Therapy Treatment  Patient Details  Name: [name] MRN: [id number] Date of Birth: 12/25/1929 Referring Provider (PT): Alysia Penna, MD   Encounter Date: 05/31/2019   CLINIC OPERATION CHANGES: Outpatient [facility] is open at lower capacity following universal masking, social distancing, and patient screening.  The patient's COVID risk of complications score is 6.   PT End of Session - 05/31/19 1532    Visit Number  12    Number of Visits  15    Authorization Type  Medicare & AARP    Authorization Time Period  100% covered    PT Start Time  1450    PT Stop Time  1530    PT Time Calculation (min)  40 min    Equipment Utilized During Treatment  Gait belt    Activity Tolerance  Patient tolerated treatment well;No increased pain    Behavior During Therapy  WFL for tasks assessed/performed       Past Medical History:  Diagnosis Date  . Atrial fibrillation ([place])   . Chronic anticoagulation   . Chronic anticoagulation   . CVA (cerebrovascular accident) ([place])   . Diabetes mellitus    pt & husband deny any knowledge of this   . History of chicken pox   . Hyperlipidemia   . Hypertension     Past Surgical History:  Procedure Laterality Date  . APPENDECTOMY    . BACK SURGERY    . CARDIAC CATHETERIZATION  11/09/91   EF 70%  . DILATION AND CURETTAGE OF UTERUS    . IR PERCUTANEOUS ART THROMBECTOMY/INFUSION INTRACRANIAL INC DIAG ANGIO  10/03/2018  . RADIOLOGY WITH ANESTHESIA N/A 10/03/2018   Procedure: IR WITH ANESTHESIA;  Surgeon: Radiologist, Medication, MD;  Location: [place];  Service: Radiology;  Laterality: N/A;    There were no vitals filed for this visit.  Subjective Assessment - 05/31/19 1448    Subjective  She had scan with no acute changes. No falls. She reports soreness in legs &  back. Husband reports a little soreness after PT evaluation but not much.    Patient is accompained by:  Family member   husband   Pertinent History  HTN, DM, CVA 48yr ago, A-Fib, chronic LBP    Patient Stated Goals  to get stronger, improve balance & mobility    Pain Score  7     Pain Location  Back    Pain Orientation  Lower;Left;Right    Pain Descriptors / Indicators  Aching;Sore    Pain Type  Chronic pain    Pain Radiating Towards  to bilateral buttocks    Pain Onset  More than a month ago    Pain Frequency  Intermittent    Aggravating Factors   sitting too long then moving    Pain Relieving Factors  tylnelol                       [facility] Adult PT Treatment/Exercise - 05/31/19 1450      Transfers   Transfers  Sit to Stand;Stand to Sit    Sit to Stand  5: Supervision;Without upper extremity assist;From chair/3-in-1    Sit to Stand Details (indicate cue type and reason)  PT verbally & demo to husband how to position for supervision in case of LOB to allow pt to perform herself;  not pull  her up    Stand to Sit  5: Supervision;Without upper extremity assist;To chair/3-in-1    Stand to Sit Details  PT verbally & demo to husband how to position for supervision in case of LOB to allow pt to perform herself    Stand Pivot Transfers  5: Supervision    Stand Pivot Transfer Details (indicate cue type and reason)  PT verbally & demo to husband how to position for supervision in case of LOB to allow pt to perform herself      Ambulation/Gait   Ambulation/Gait  Yes    Ambulation/Gait Assistance  4: Min assist    Ambulation/Gait Assistance Details  demo & verbal cues on HHA technique to facilitate upright posture, mild back support & safety. Pt's husband able to return demo including negotiating tight area similar to home.      Ambulation Distance (Feet)  100 Feet   100' X 2   Assistive device  1 person hand held assist    Gait Pattern  Step-through pattern;Antalgic     Ambulation Surface  Indoor;Level    Gait velocity  --    Door Management  --    Ramp  --    Curb  --      Posture/Postural Control   Posture/Postural Control  --    Postural Limitations  --      Standardized Balance Assessment   Standardized Balance Assessment  --      Self-Care   Self-Care  Other Self-Care Comments    Other Self-Care Comments   verbal & demo cues on sitting posture using 4" stool to support LEs due to short legs. Pt & husband verbalized understanding      Knee/Hip Exercises: Standing   Other Standing Knee Exercises  sit to/from stand 5 reps & stand for 5 seconds with upright posture prior to sitting down.  PT recommended performing hourly if she has not walked in home.              PT Education - 05/31/19 1525    Education Details  Discussed sitting & napping in recliner for prolonged amount of time per day aggravates her back pain; napping in bed with way to alert husband when she awakens; use of foot stool to support LEs when sitting in standard chair; increasing frequency of moving around house    Person(s) Educated  Patient;Spouse    Methods  Explanation;Demonstration;Verbal cues    Comprehension  Verbalized understanding       PT Short Term Goals - 11/22/18 1452      PT SHORT TERM GOAL #1   Title  Patient's husband verbalizes fall risk prevention strategies. (All STGs Target Date: 11/25/2018)    Baseline  11/22/18: met per spouse report.     Status  Achieved      PT SHORT TERM GOAL #2   Title  patient ambulates 100' around furniture with RW with supervision.     Baseline  11/22/18: met today    Time  --    Period  --    Status  Achieved      PT SHORT TERM GOAL #3   Title  Patient sit to/from stand from chairs with armrests without touching external support to stabilize with supervision.     Baseline  11/22/18: met today    Time  --    Period  --    Status  Achieved      PT SHORT TERM GOAL #4   Title  Patient & husband demonstrate understanding  of initial HEP.     Baseline  11/22/18: met with current HEP    Status  Achieved        PT Long Term Goals - 04/25/19 1913      PT LONG TERM GOAL #1   Title  Husband demonstrates proper hand hold assistance for standing & gait activities and fall prevention strategies.  (All LTGs Target Date: 06/12/2019)    Time  4    Period  Weeks    Status  Revised    Target Date  06/12/19      PT LONG TERM GOAL #2   Title  Husband verbalizes safe supervision level for standing activities.    Time  4    Period  Weeks    Status  New    Target Date  06/12/19      PT LONG TERM GOAL #3   Title  Timed Up & Go with supervision <13.5 seconds    Time  4    Period  Weeks    Status  Revised    Target Date  06/12/19      PT LONG TERM GOAL #4   Title  Patient ambulates 300' outdoors on paved, gravel & grass surfaces with husband's hand hold assist safely.    Time  4    Period  Weeks    Status  Revised    Target Date  06/12/19      PT LONG TERM GOAL #5   Title  Patient negotiates ramps, curbs & 2 steps similar to home entrance with husband's hand hold assist safely.    Time  4    Period  Weeks    Status  Revised    Target Date  06/12/19      PT LONG TERM GOAL #6   Title  Husband & patient demonstrate & verbalize understanding of HEP    Time  4    Period  Weeks    Status  New    Target Date  06/12/19            Plan - 05/31/19 1744    Clinical Impression Statement  Today's session focused on educating patient & husband on decreasing amount of time that she spends in her recliner and how to safely assist gait without AD with hand hold assist.    Rehab Potential  Good    PT Frequency  1x / week    PT Duration  4 weeks    PT Treatment/Interventions  ADLs/Self Care Home Management;DME Instruction;Gait training;Stair training;Functional mobility training;Therapeutic activities;Therapeutic exercise;Balance training;Neuromuscular re-education;Patient/family  education;Vestibular;Cryotherapy;Manual techniques    PT Next Visit Plan  work towards updated [place] and Agree with Plan of Care  Patient;Family member/caregiver    Family Member Consulted  husband, Nelson Chimes       Patient will benefit from skilled therapeutic intervention in order to improve the following deficits and impairments:  Abnormal gait, Decreased activity tolerance, Decreased balance, Decreased cognition, Decreased coordination, Decreased endurance, Decreased knowledge of use of DME, Decreased mobility, Decreased strength, Dizziness, Postural dysfunction, Pain  Visit Diagnosis: 1. Muscle weakness (generalized)   2. Other abnormalities of gait and mobility   3. Hemiplegia and hemiparesis following cerebral infarction affecting right dominant side ([place])   4. Unsteadiness on feet   5. Abnormal posture        Problem List Patient Active Problem  List   Diagnosis Date Noted  . Cough 03/21/2019  . Expressive aphasia 11/17/2018  . Gait disturbance, post-stroke 11/17/2018  . Hemiparesis affecting dominant side as late effect of cerebrovascular accident ([place]) 11/17/2018  . Aphasia, post-stroke 11/17/2018  . Aphasia as late effect of stroke 11/08/2018  . 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 Left middle cerebral artery stroke ([place]) 10/06/2018  . Dyslipidemia   . Diabetes mellitus type 2 in nonobese ([facility])   . Atrial fibrillation ([place])   . Tachypnea   . Stage 3 chronic kidney disease ([place])   . Stroke [facility]) ischemic embolic L MCA  d/t AF 20/35/5974  . 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 Frequent falls 10/13/2017  . Vitamin D deficiency 04/22/2017  . Slurring of speech 03/31/2017  . Back pain 09/14/2016  . Compression fracture of thoracic vertebra ([facility]) 09/14/2016  . S/P vertebroplasty 09/14/2016  . Chronic diastolic heart failure ([place]) 09/14/2016  . HTN (hypertension) 09/14/2016  . History of CVA  (cerebrovascular accident) 09/14/2016  . Hypokalemia 09/11/2016  . Paraspinal hematoma 09/11/2016  . Osteoporosis 05/27/2016  . Hypothyroidism 09/11/2014  . Persistent atrial fibrillation ([place]) 12/15/2012  . HLD (hyperlipidemia) 03/27/2011    Teva Bronkema PT, DPT 06/01/2019, 7:51 AM  [facility] [address] [place], [place], [address] Phone: [phone]   Fax:  [phone]  Name: Syanna Remmert MRN: [id number] Date of Birth: 01-21-1930

## 2019-06-05 ENCOUNTER — Ambulatory Visit: Payer: Medicare Other | Admitting: Physical Therapy

## 2019-06-05 ENCOUNTER — Other Ambulatory Visit: Payer: Self-pay

## 2019-06-05 ENCOUNTER — Encounter: Payer: Self-pay | Admitting: Physical Therapy

## 2019-06-05 DIAGNOSIS — R482 Apraxia: Secondary | ICD-10-CM | POA: Diagnosis not present

## 2019-06-05 DIAGNOSIS — R2689 Other abnormalities of gait and mobility: Secondary | ICD-10-CM | POA: Diagnosis not present

## 2019-06-05 DIAGNOSIS — R2681 Unsteadiness on feet: Secondary | ICD-10-CM

## 2019-06-05 DIAGNOSIS — R41841 Cognitive communication deficit: Secondary | ICD-10-CM | POA: Diagnosis not present

## 2019-06-05 DIAGNOSIS — I69351 Hemiplegia and hemiparesis following cerebral infarction affecting right dominant side: Secondary | ICD-10-CM

## 2019-06-05 DIAGNOSIS — M6281 Muscle weakness (generalized): Secondary | ICD-10-CM | POA: Diagnosis not present

## 2019-06-05 DIAGNOSIS — R293 Abnormal posture: Secondary | ICD-10-CM

## 2019-06-05 DIAGNOSIS — R4701 Aphasia: Secondary | ICD-10-CM | POA: Diagnosis not present

## 2019-06-05 NOTE — Therapy (Signed)
[facility] [address] [place], [place], [address] Phone: [phone]   Fax:  [phone]  Physical Therapy Treatment  Patient Details  Name: [name] MRN: [id number] Date of Birth: 05-19-1930 Referring Provider (PT): Alysia Penna, MD   Encounter Date: 06/05/2019   CLINIC OPERATION CHANGES: Outpatient [facility]hab is open at lower capacity following universal masking, social distancing, and patient screening.  The patient's COVID risk of complications score is 6.   PT End of Session - 06/05/19 1444    Visit Number  13    Number of Visits  15    Authorization Type  Medicare & AARP    Authorization Time Period  100% covered    PT Start Time  1400    PT Stop Time  1445    PT Time Calculation (min)  45 min    Equipment Utilized During Treatment  Gait belt    Activity Tolerance  Patient tolerated treatment well;No increased pain    Behavior During Therapy  WFL for tasks assessed/performed       Past Medical History:  Diagnosis Date  . Atrial fibrillation ([place])   . Chronic anticoagulation   . Chronic anticoagulation   . CVA (cerebrovascular accident) ([place])   . Diabetes mellitus    pt & husband deny any knowledge of this   . History of chicken pox   . Hyperlipidemia   . Hypertension     Past Surgical History:  Procedure Laterality Date  . APPENDECTOMY    . BACK SURGERY    . CARDIAC CATHETERIZATION  11/09/91   EF 70%  . DILATION AND CURETTAGE OF UTERUS    . IR PERCUTANEOUS ART THROMBECTOMY/INFUSION INTRACRANIAL INC DIAG ANGIO  10/03/2018  . RADIOLOGY WITH ANESTHESIA N/A 10/03/2018   Procedure: IR WITH ANESTHESIA;  Surgeon: Radiologist, Medication, MD;  Location: [facility];  Service: Radiology;  Laterality: N/A;    There were no vitals filed for this visit.  Subjective Assessment - 06/05/19 1400    Subjective  She did try to get out of recliner more. Her back & buttocks have no change for  better or worse.    Patient is accompained by:  Family member   husband   Pertinent History  HTN, DM, CVA 56yr ago, A-Fib, chronic LBP    Patient Stated Goals  to get stronger, improve balance & mobility    Currently in Pain?  Yes    Pain Score  5     Pain Location  Back    Pain Orientation  Lower;Right;Left    Pain Descriptors / Indicators  Aching    Pain Type  Chronic pain    Pain Onset  More than a month ago    Pain Frequency  Constant    Aggravating Factors   staying in same position                       [facility] 1400      Transfers   Transfers  Sit to Stand;Stand to Sit    Sit to Stand  5: Supervision;Without upper extremity assist;From chair/3-in-1    Stand to Sit  5: Supervision;Without upper extremity assist;To chair/3-in-1    Stand Pivot Transfers  --      Ambulation/Gait   Ambulation/Gait  Yes    Ambulation/Gait Assistance  4: Min assist    Ambulation/Gait Assistance Details  demo & verbal cues on hand hold  assist gait    Ambulation Distance (Feet)  150 Feet   150' X 2   Assistive device  1 person hand held assist    Gait Pattern  Step-through pattern;Antalgic    Ambulation Surface  Indoor;Level;Outdoor;Paved    Ramp  4: Min assist   Hand hold assist   Ramp Details (indicate cue type and reason)  verbal & tactile cues on upright posture and weight shift    Curb  4: Min assist   Hand hold assist   Curb Details (indicate cue type and reason)  2 reps: verbal & tactile cues on upright posture and weight shift      Self-Care   Self-Care  --    Other Self-Care Comments   --      Knee/Hip Exercises: Standing   Other Standing Knee Exercises  sit to/from stand 5 reps & stand for 5 seconds with upright posture prior to sitting down.  PT recommended performing hourly if she has not walked in home.              PT Education - 06/05/19 1435    Education Details  Seated stretches for hip    Person(s) Educated   Patient;Spouse    Methods  Explanation;Demonstration;Verbal cues;Handout;Tactile cues    Comprehension  Verbalized understanding;Returned demonstration;Need further instruction       PT Short Term Goals - 11/22/18 1452      PT SHORT TERM GOAL #1   Title  Patient's husband verbalizes fall risk prevention strategies. (All STGs Target Date: 11/25/2018)    Baseline  11/22/18: met per spouse report.     Status  Achieved      PT SHORT TERM GOAL #2   Title  patient ambulates 100' around furniture with RW with supervision.     Baseline  11/22/18: met today    Time  --    Period  --    Status  Achieved      PT SHORT TERM GOAL #3   Title  Patient sit to/from stand from chairs with armrests without touching external support to stabilize with supervision.     Baseline  11/22/18: met today    Time  --    Period  --    Status  Achieved      PT SHORT TERM GOAL #4   Title  Patient & husband demonstrate understanding of initial HEP.     Baseline  11/22/18: met with current HEP    Status  Achieved        PT Long Term Goals - 04/25/19 1913      PT LONG TERM GOAL #1   Title  Husband demonstrates proper hand hold assistance for standing & gait activities and fall prevention strategies.  (All LTGs Target Date: 06/12/2019)    Time  4    Period  Weeks    Status  Revised    Target Date  06/12/19      PT LONG TERM GOAL #2   Title  Husband verbalizes safe supervision level for standing activities.    Time  4    Period  Weeks    Status  New    Target Date  06/12/19      PT LONG TERM GOAL #3   Title  Timed Up & Go with supervision <13.5 seconds    Time  4    Period  Weeks    Status  Revised    Target Date  06/12/19  PT LONG TERM GOAL #4   Title  Patient ambulates 300' outdoors on paved, gravel & grass surfaces with husband's hand hold assist safely.    Time  4    Period  Weeks    Status  Revised    Target Date  06/12/19      PT LONG TERM GOAL #5   Title  Patient negotiates ramps, curbs  & 2 steps similar to home entrance with husband's hand hold assist safely.    Time  4    Period  Weeks    Status  Revised    Target Date  06/12/19      PT LONG TERM GOAL #6   Title  Husband & patient demonstrate & verbalize understanding of HEP    Time  4    Period  Weeks    Status  New    Target Date  06/12/19            Plan - 06/05/19 2142    Clinical Impression Statement  PT added seated stretches to HEP. PT reviewed recommendation to limit time sitting in recliner. Patient able to tolerate increased gait distances & negotiating ramps/curbs.    Rehab Potential  Good    PT Frequency  1x / week    PT Duration  4 weeks    PT Treatment/Interventions  ADLs/Self Care Home Management;DME Instruction;Gait training;Stair training;Functional mobility training;Therapeutic activities;Therapeutic exercise;Balance training;Neuromuscular re-education;Patient/family education;Vestibular;Cryotherapy;Manual techniques    PT Next Visit Plan  work towards updated [place] and Agree with Plan of Care  Patient;Family member/caregiver    Family Member Consulted  husband, Nelson Chimes       Patient will benefit from skilled therapeutic intervention in order to improve the following deficits and impairments:  Abnormal gait, Decreased activity tolerance, Decreased balance, Decreased cognition, Decreased coordination, Decreased endurance, Decreased knowledge of use of DME, Decreased mobility, Decreased strength, Dizziness, Postural dysfunction, Pain  Visit Diagnosis: 1. Muscle weakness (generalized)   2. Other abnormalities of gait and mobility   3. Hemiplegia and hemiparesis following cerebral infarction affecting right dominant side ([place])   4. Unsteadiness on feet   5. Abnormal posture        Problem List Patient Active Problem List   Diagnosis Date Noted  . Cough 03/21/2019  . Expressive aphasia 11/17/2018  . Gait disturbance,  post-stroke 11/17/2018  . Hemiparesis affecting dominant side as late effect of cerebrovascular accident ([place]) 11/17/2018  . Aphasia, post-stroke 11/17/2018  . Aphasia as late effect of stroke 11/08/2018  . 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 Left middle cerebral artery stroke ([place]) 10/06/2018  . Dyslipidemia   . Diabetes mellitus type 2 in nonobese (HCC)   . Atrial fibrillation ([place])   . Tachypnea   . Stage 3 chronic kidney disease ([place])   . Stroke [facility]) ischemic embolic L MCA  d/t AF 16/83/7290  . 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 Frequent falls 10/13/2017  . Vitamin D deficiency 04/22/2017  . Slurring of speech 03/31/2017  . Back pain 09/14/2016  . Compression fracture of thoracic vertebra (H[facility]) 09/14/2016  . S/P vertebroplasty 09/14/2016  . Chronic diastolic heart failure ([place]) 09/14/2016  . HTN (hypertension) 09/14/2016  . History of CVA (cerebrovascular accident) 09/14/2016  . Hypokalemia 09/11/2016  . Paraspinal hematoma 09/11/2016  . Osteoporosis 05/27/2016  . Hypothyroidism 09/11/2014  . Persistent atrial fibrillation ([place]) 12/15/2012  . HLD (hyperlipidemia) 03/27/2011  Jamey Reas PT, DPT 06/05/2019, 9:50 PM  [facility] [address] [place], [place], [address] Phone: [phone]   Fax:  [phone]  Name: [name] MRN: [id number] Date of Birth: [birth year]

## 2019-06-05 NOTE — Patient Instructions (Signed)
Access Code: [email or web address]  URL: [email or web address]  Date: 06/05/2019  Prepared by: Jamey Reas   Exercises Seated Trunk Rotation Stretch - 1 reps - 1 sets - 30 seconds hold - 2-3x daily - 5x weekly Seated Active Hip Flexion - 1 reps - 1 sets - 30 seconds hold - 2-3x daily - 5x weekly Seated Hip External Rotation Stretch - 1 reps - 1 sets - 30 seconds hold - 2-3x daily - 5x weekly

## 2019-06-06 ENCOUNTER — Ambulatory Visit: Payer: Medicare Other | Admitting: Speech Pathology

## 2019-06-06 ENCOUNTER — Other Ambulatory Visit: Payer: Self-pay

## 2019-06-06 DIAGNOSIS — R2681 Unsteadiness on feet: Secondary | ICD-10-CM | POA: Diagnosis not present

## 2019-06-06 DIAGNOSIS — R41841 Cognitive communication deficit: Secondary | ICD-10-CM | POA: Diagnosis not present

## 2019-06-06 DIAGNOSIS — R4701 Aphasia: Secondary | ICD-10-CM

## 2019-06-06 DIAGNOSIS — R2689 Other abnormalities of gait and mobility: Secondary | ICD-10-CM | POA: Diagnosis not present

## 2019-06-06 DIAGNOSIS — R482 Apraxia: Secondary | ICD-10-CM | POA: Diagnosis not present

## 2019-06-06 DIAGNOSIS — M6281 Muscle weakness (generalized): Secondary | ICD-10-CM | POA: Diagnosis not present

## 2019-06-06 NOTE — Patient Instructions (Signed)
[name],  Your job is to ask the right questions to help you understand what Janah is talking about. NOT to get her to say the "right" word. If you figure out what she's saying, say it back to her. If she says "yes," or confirms, move on.   Focus on the MESSAGE (did you understand the concept?), not the pronunciation.  Questions to ask:  What group does it belong to?  What do I use it for?  Where Janice I find it?  What does it LOOK like?  What other words go with it?   Many Ways to Communicate  Martinez it Write it Draw it Gesture it Use related words

## 2019-06-06 NOTE — Therapy (Signed)
[facility] [address] [place], [place], [address] Phone: [phone]   Fax:  [phone]  Speech Language Pathology Treatment  Patient Details  Name: [name] MRN: [id number] Date of Birth: 26-Aug-1930 Referring Provider (SLP): Dr. Letta Pate   Encounter Date: 06/06/2019  End of Session - 06/06/19 1639    Visit Number  24    Number of Visits  26    Date for SLP Re-Evaluation  06/19/19    Authorization Type  MCR    Authorization Time Period  90 days    SLP Start Time  1535    SLP Stop Time   1620    SLP Time Calculation (min)  45 min       Past Medical History:  Diagnosis Date  . Atrial fibrillation ([place])   . Chronic anticoagulation   . Chronic anticoagulation   . CVA (cerebrovascular accident) ([place])   . Diabetes mellitus    pt & husband deny any knowledge of this   . History of chicken pox   . Hyperlipidemia   . Hypertension     Past Surgical History:  Procedure Laterality Date  . APPENDECTOMY    . BACK SURGERY    . CARDIAC CATHETERIZATION  11/09/91   EF 70%  . DILATION AND CURETTAGE OF UTERUS    . IR PERCUTANEOUS ART THROMBECTOMY/INFUSION INTRACRANIAL INC DIAG ANGIO  10/03/2018  . RADIOLOGY WITH ANESTHESIA N/A 10/03/2018   Procedure: IR WITH ANESTHESIA;  Surgeon: Radiologist, Medication, MD;  Location: [facility];  Service: Radiology;  Laterality: N/A;    There were no vitals filed for this visit.  Subjective Assessment - 06/06/19 1538    Subjective  "Well my back's bothering me so bad, I was telling Shirlean Mylar this morning."    Patient is accompained by:  Family member   [name]   Currently in Pain?  Yes    Pain Score  7     Pain Location  Back    Pain Orientation  Lower;Right;Left    Pain Descriptors / Indicators  Aching    Pain Type  Chronic pain            ADULT SLP TREATMENT - 06/06/19 1539      General Information   Behavior/Cognition  Alert;Cooperative;Requires cueing       Treatment Provided   Treatment provided  Cognitive-Linquistic      Pain Assessment   Pain Assessment  No/denies pain      Cognitive-Linquistic Treatment   Treatment focused on  Aphasia;Apraxia    Skilled Treatment  Husband reported understanding pt when she is talking about numbers or appointment times has been frustrating. SLP asked pt simple questions with numeric answers, pt accuracy ~60% (1-2 digit), pt aware of errors ~75%. Attempted to have pt write responses but this was not particularly effective (number perseveration). SLP engaged pt in functional task communicating appointment times with spouse. Pt stated time correctly to husband 25% of the time, but was able to confirm appropriate time using number line 75% accuracy. Continued training in communication/cuing strategies with spouse, with SLP first modeling question cues and then having spouse take over for "guessing game" with SLP guessing pictured items. Husband required occasional mod cues to reference list of questions to ask pt if her responses were paraphasic. Husband continues to report quizzing pt on names despite SLP's continual reinforcement of need to focus on quality of message received vs rote speech/pronunciation. Continue to provide written and verbal education  as well as demonstration.SLP did note spouse confirming pt's message/competence when appropriate, and he reports using visual supports at home.       Assessment / Recommendations / Plan   Plan  Goals updated   downgraded comm partner goal     Progression Toward Goals   Progression toward goals  Progressing toward goals       SLP Education - 06/06/19 1638    Education Details  refrain from "drilling" or "quizzing," ask questions to help understand the message    Person(s) Educated  Spouse;Patient    Methods  Explanation;Demonstration;Verbal cues;Handout       SLP Short Term Goals - 06/06/19 1639      SLP SHORT TERM GOAL #1   Title  Pt will demo awareness  of 50% of errors in a functional task with usual mod A (verbal/written cues).     Status  Partially Met      SLP SHORT TERM GOAL #2   Title  Pt will communicate preferences or needs (food, activities, medical etc) x 3 sessions with usual mod A (AAC if necessary).    Baseline  11-25-18 (vegetables), 12-12-18 (fruits, vegetables, tv dinners)    Status  Partially Met      SLP SHORT TERM GOAL #3   Title  Pt will communicate personal details (medical history, family, name, age, etc) with usual mod A x 3 sessions (AAC if necessary)    Baseline  pain level 12-12-18, 12/19/18    Status  Partially Met      SLP SHORT TERM GOAL #4   Title  When given verbal or written cues of speech errors, pt will attempt to rephrase or use AAC to clarify her message with usual mod A x 3 sessions.    Time  1    Period  Weeks    Status  Not Met      SLP SHORT TERM GOAL #5   Title  Pt will use AAC to communicate a request (food, assistance, clothing) outside of ST session with assistance from spouse (per pt/spouse report).    Time  1    Period  Weeks    Status  Not Met       SLP Long Term Goals - 06/06/19 1639      SLP LONG TERM GOAL #1   Title  Pt will attempt error correction in 3/5 opportunities when cued appropriately by communication partner x 3 sessions.    Baseline  word level 12/27/18    Time  5   renewed 03/21/19   Period  Weeks    Status  Deferred   focus on functional communication with pt/spouse     SLP LONG TERM GOAL #2   Title  Family will demo understanding of at least 2 appropriate ways to enhance pt's comprehension/expression during 4 sessions.    Baseline  06/06/19    Time  4   renewed 03/21/19   Period  Weeks    Status  Revised      SLP LONG TERM GOAL #3   Title  Pt will reduce social isolation risks by communicating social introductions, common social messages, and personal interests using multimodal communication with occasional min A from communication partner over 4 sessions.    Time  4     Period  Weeks    Status  Deferred      SLP LONG TERM GOAL #4   Title  Pt/family will report improvement in understanding of pt's needs and  requests than prior to ST.    Time  3   renewed 03/21/19   Period  Weeks    Status  On-going       Plan - 06/06/19 1643    Clinical Impression Statement  Patient continues with severe expressive aphasia, with expressive > receptive deficits. Pt reponse to confrontation of errors continues to be more positive than in previous sessions, and she has been more willing to use description and gestures during therapy tasks. Level of cuing required commensurate with previous session; anticipate pt will need ongoing cues and partner support for carryover of strategies. Spouse eager to assist but has required repetitive education/instruction re: how to assist patient. Anticipate d/c next 1-2 sessions. I recommend cont'd skilled ST to address incr'd pt awareness of verbal errors, to maximize communication and to train communication partner for pt use of AAC for wants/needs, safety, independence and QOL.    Speech Therapy Frequency  1x /week    Duration  --   total of 26 visits   Treatment/Interventions  Cognitive reorganization;Multimodal communcation approach;Compensatory strategies;Language facilitation;Compensatory techniques;Cueing hierarchy;Internal/external aids;Functional tasks;SLP instruction and feedback;Patient/family education    Potential to Achieve Goals  Fair    Potential Considerations  Cooperation/participation level;Severity of impairments;Ability to learn/carryover information    Consulted and Agree with Plan of Care  Patient;Family member/caregiver    Family Member Consulted  Husband [name]       Patient will benefit from skilled therapeutic intervention in order to improve the following deficits and impairments:   1. Aphasia   2. Apraxia   3. Cognitive communication deficit       Problem List Patient Active Problem List   Diagnosis Date  Noted  . Cough 03/21/2019  . Expressive aphasia 11/17/2018  . Gait disturbance, post-stroke 11/17/2018  . Hemiparesis affecting dominant side as late effect of cerebrovascular accident ([place]) 11/17/2018  . Aphasia, post-stroke 11/17/2018  . Aphasia as late effect of stroke 11/08/2018  . 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 Left middle cerebral artery stroke ([place]) 10/06/2018  . Dyslipidemia   . Diabetes mellitus type 2 in nonobese ([facility])   . Atrial fibrillation ([place])   . Tachypnea   . Stage 3 chronic kidney disease ([place])   . Stroke [facility]) ischemic embolic L MCA  d/t AF 90/38/3338  . 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 Frequent falls 10/13/2017  . Vitamin D deficiency 04/22/2017  . Slurring of speech 03/31/2017  . Back pain 09/14/2016  . Compression fracture of thoracic vertebra ([facility]) 09/14/2016  . S/P vertebroplasty 09/14/2016  . Chronic diastolic heart failure ([place]) 09/14/2016  . HTN (hypertension) 09/14/2016  . History of CVA (cerebrovascular accident) 09/14/2016  . Hypokalemia 09/11/2016  . Paraspinal hematoma 09/11/2016  . Osteoporosis 05/27/2016  . Hypothyroidism 09/11/2014  . Persistent atrial fibrillation ([place]) 12/15/2012  . HLD (hyperlipidemia) 03/27/2011   Deneise Lever, [place], [place] 06/06/2019, 4:47 PM  [facility] [address] [place], [place], [address] Phone: [phone]   Fax:  [phone]   Name: [name] MRN: [id number] Date of Birth: 1930/03/20

## 2019-06-08 ENCOUNTER — Other Ambulatory Visit: Payer: Self-pay

## 2019-06-08 ENCOUNTER — Encounter: Payer: Medicare Other | Admitting: Physical Medicine & Rehabilitation

## 2019-06-10 ENCOUNTER — Other Ambulatory Visit: Payer: Self-pay | Admitting: Cardiovascular Disease

## 2019-06-12 ENCOUNTER — Encounter: Payer: Self-pay | Admitting: Physical Therapy

## 2019-06-12 ENCOUNTER — Other Ambulatory Visit: Payer: Self-pay

## 2019-06-12 ENCOUNTER — Ambulatory Visit: Payer: Medicare Other | Admitting: Physical Therapy

## 2019-06-12 DIAGNOSIS — R482 Apraxia: Secondary | ICD-10-CM | POA: Diagnosis not present

## 2019-06-12 DIAGNOSIS — R41841 Cognitive communication deficit: Secondary | ICD-10-CM | POA: Diagnosis not present

## 2019-06-12 DIAGNOSIS — R4701 Aphasia: Secondary | ICD-10-CM | POA: Diagnosis not present

## 2019-06-12 DIAGNOSIS — M6281 Muscle weakness (generalized): Secondary | ICD-10-CM | POA: Diagnosis not present

## 2019-06-12 DIAGNOSIS — I69351 Hemiplegia and hemiparesis following cerebral infarction affecting right dominant side: Secondary | ICD-10-CM

## 2019-06-12 DIAGNOSIS — R2689 Other abnormalities of gait and mobility: Secondary | ICD-10-CM | POA: Diagnosis not present

## 2019-06-12 DIAGNOSIS — R2681 Unsteadiness on feet: Secondary | ICD-10-CM

## 2019-06-12 DIAGNOSIS — R293 Abnormal posture: Secondary | ICD-10-CM

## 2019-06-12 NOTE — Therapy (Signed)
[facility] [address] [place], [place], [address] Phone: [phone]   Fax:  [phone]  Physical Therapy Treatment & Discharge Summary  Patient Details  Name: [name] MRN: [id number] Date of Birth: 09/21/1930 Referring Provider (PT): Andrew Kirsteins, MD   Encounter Date: 06/12/2019   CLINIC OPERATION CHANGES: Outpatient Neuro Rehab is open at lower capacity following universal masking, social distancing, and patient screening.  The patient's COVID risk of complications score is 6.  PHYSICAL THERAPY DISCHARGE SUMMARY  Visits from Start of Care: 14  Current functional level related to goals / functional outcomes: See below   Remaining deficits: See below   Education / Equipment: HEP, improving mobility, husband providing hand hold assisted gait.   Plan: Patient agrees to discharge.  Patient revised goals were partially met. Patient is being discharged due to meeting the stated rehab goals.  ?????        PT End of Session - 06/12/19 1401    Visit Number  14    Number of Visits  15    Authorization Type  Medicare & AARP    Authorization Time Period  100% covered    PT Start Time  1317    PT Stop Time  1400    PT Time Calculation (min)  43 min    Equipment Utilized During Treatment  Gait belt    Activity Tolerance  Patient tolerated treatment well;No increased pain    Behavior During Therapy  WFL for tasks assessed/performed       Past Medical History:  Diagnosis Date  . Atrial fibrillation ([facility])   . Chronic anticoagulation   . Chronic anticoagulation   . CVA (cerebrovascular accident) ([facility])   . Diabetes mellitus    pt & husband deny any knowledge of this   . History of chicken pox   . Hyperlipidemia   . Hypertension     Past Surgical History:  Procedure Laterality Date  . APPENDECTOMY    . BACK SURGERY    . CARDIAC CATHETERIZATION  11/09/91   EF 70%  . DILATION AND  CURETTAGE OF UTERUS    . IR PERCUTANEOUS ART THROMBECTOMY/INFUSION INTRACRANIAL INC DIAG ANGIO  10/03/2018  . RADIOLOGY WITH ANESTHESIA N/A 10/03/2018   Procedure: IR WITH ANESTHESIA;  Surgeon: Radiologist, Medication, MD;  Location: [facility];  Service: Radiology;  Laterality: N/A;    There were no vitals filed for this visit.  Subjective Assessment - 06/12/19 1318    Subjective  She is trying to get out of recliner more.    Patient is accompained by:  Family member   husband   Pertinent History  HTN, DM, CVA 10yrs ago, A-Fib, chronic LBP    Patient Stated Goals  to get stronger, improve balance & mobility    Currently in Pain?  Yes    Pain Score  7    lowest 5/10   Pain Location  Back    Pain Orientation  Lower;Right;Left    Pain Descriptors / Indicators  Aching;Sore    Pain Type  Chronic pain    Pain Radiating Towards  both buttocks    Pain Onset  More than a month ago    Pain Frequency  Constant    Aggravating Factors   staying one position too long, first thing in morning    Pain Relieving Factors  tylenol takes edge off,         [facility] PT Assessment - 06/12/19 1320        Timed Up and Go Test   Normal TUG (seconds)  14.11   14.11 sec light hand hold                  [facility] Adult PT Treatment/Exercise - 06/12/19 1320      Transfers   Transfers  Sit to Stand;Stand to Sit    Sit to Stand  5: Supervision;Without upper extremity assist;From chair/3-in-1    Stand to Sit  5: Supervision;Without upper extremity assist;To chair/3-in-1      Ambulation/Gait   Ambulation/Gait  Yes    Ambulation/Gait Assistance  4: Min assist    Ambulation/Gait Assistance Details  husband demo proper HHA & fall prevention strategies    Ambulation Distance (Feet)  150 Feet   100'   Assistive device  1 person hand held assist    Gait Pattern  Step-through pattern;Antalgic    Ambulation Surface  Level;Indoor;Outdoor;Paved    Ramp  4: Min assist   Hand hold assist from husband safely    Curb  4: Min assist   Hand hold assist from husband safely     Knee/Hip Exercises: Standing   Other Standing Knee Exercises  --             PT Education - 06/12/19 1355    Education Details  reviewed stretching HEP  [employer] Access [id number]   Reviewed progressively activity with 1/day long (her max tolerable distance currently ~100'), 3-5 medium distance gait (enter/exit home or round trip around home) and increasing frequency of short walks (within room or room to room with rest in 2nd room like toileting)    Person(s) Educated  Patient;Spouse    Methods  Explanation;Demonstration;Verbal cues    Comprehension  Verbalized understanding       PT Short Term Goals - 11/22/18 1452      PT SHORT TERM GOAL #1   Title  Patient's husband verbalizes fall risk prevention strategies. (All STGs Target Date: 11/25/2018)    Baseline  11/22/18: met per spouse report.     Status  Achieved      PT SHORT TERM GOAL #2   Title  patient ambulates 100' around furniture with RW with supervision.     Baseline  11/22/18: met today    Time  --    Period  --    Status  Achieved      PT SHORT TERM GOAL #3   Title  Patient sit to/from stand from chairs with armrests without touching external support to stabilize with supervision.     Baseline  11/22/18: met today    Time  --    Period  --    Status  Achieved      PT SHORT TERM GOAL #4   Title  Patient & husband demonstrate understanding of initial HEP.     Baseline  11/22/18: met with current HEP    Status  Achieved        PT Long Term Goals - 06/12/19 2146      PT LONG TERM GOAL #1   Title  Husband demonstrates proper hand hold assistance for standing & gait activities and fall prevention strategies.  (All LTGs Target Date: 06/12/2019)    Baseline  MET 06/12/2019    Time  4    Period  Weeks    Status  Achieved      PT LONG TERM GOAL #2   Title  Husband verbalizes safe supervision level for standing activities.    Baseline  MET 06/12/2019     Time  4    Period  Weeks    Status  Achieved      PT LONG TERM GOAL #3   Title  Timed Up & Go with supervision <13.5 seconds    Baseline  TUG with light hand hold 14.11 sec    Time  4    Period  Weeks    Status  Partially Met      PT LONG TERM GOAL #4   Title  Patient ambulates 150' outdoors on paved, gravel & grass surfaces with husband's hand hold assist safely.    Baseline  Met 06/12/2019 Patient's husband demo proper hand hold assist up to 150'.    Time  4    Period  Weeks    Status  Achieved      PT LONG TERM GOAL #5   Title  Patient negotiates ramps, curbs & 2 steps similar to home entrance with husband's hand hold assist safely.    Baseline  MET 06/12/2019    Time  4    Period  Weeks    Status  Achieved      PT LONG TERM GOAL #6   Title  Husband & patient demonstrate & verbalize understanding of HEP    Baseline  MET 06/12/2019    Time  4    Period  Weeks    Status  Achieved            Plan - 06/12/19 2148    Clinical Impression Statement  Patient met revised LTGs. She is mobile with husband's hand hold assist. Husband reports has been able to enter & exit home with hand hold assist. Her low back pain limits mobility. Pt & husband appear to understand how sitting in recliner long periods is negatively effecting her LBP.    Rehab Potential  Good    PT Frequency  1x / week    PT Duration  4 weeks    PT Treatment/Interventions  ADLs/Self Care Home Management;DME Instruction;Gait training;Stair training;Functional mobility training;Therapeutic activities;Therapeutic exercise;Balance training;Neuromuscular re-education;Patient/family education;Vestibular;Cryotherapy;Manual techniques    PT Next Visit Plan  discharge PT    PT [place] and Agree with Plan of Care  Patient;Family member/caregiver    Family Member Consulted  husband, Nelson Chimes       Patient will benefit from skilled therapeutic intervention in order to improve the  following deficits and impairments:  Abnormal gait, Decreased activity tolerance, Decreased balance, Decreased cognition, Decreased coordination, Decreased endurance, Decreased knowledge of use of DME, Decreased mobility, Decreased strength, Dizziness, Postural dysfunction, Pain  Visit Diagnosis: Muscle weakness (generalized)  Other abnormalities of gait and mobility  Unsteadiness on feet  Abnormal posture  Hemiplegia and hemiparesis following cerebral infarction affecting right dominant side [facility])     Problem List Patient Active Problem List   Diagnosis Date Noted  . Cough 03/21/2019  . Expressive aphasia 11/17/2018  . Gait disturbance, post-stroke 11/17/2018  . Hemiparesis affecting dominant side as late effect of cerebrovascular accident ([place]) 11/17/2018  . Aphasia, post-stroke 11/17/2018  . Aphasia as late effect of stroke 11/08/2018  . 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 Left middle cerebral artery stroke ([place]) 10/06/2018  . Dyslipidemia   . Diabetes mellitus type 2 in nonobese ([facility])   . Atrial fibrillation ([place])   . Tachypnea   . Stage 3 chronic kidney disease ([place])   . Stroke [facility]) ischemic embolic L MCA  d/t  AF 10/03/2018  . 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 Frequent falls 10/13/2017  . Vitamin D deficiency 04/22/2017  . Slurring of speech 03/31/2017  . Back pain 09/14/2016  . Compression fracture of thoracic vertebra ([facility]) 09/14/2016  . S/P vertebroplasty 09/14/2016  . Chronic diastolic heart failure ([place]) 09/14/2016  . HTN (hypertension) 09/14/2016  . History of CVA (cerebrovascular accident) 09/14/2016  . Hypokalemia 09/11/2016  . Paraspinal hematoma 09/11/2016  . Osteoporosis 05/27/2016  . Hypothyroidism 09/11/2014  . Persistent atrial fibrillation ([place]) 12/15/2012  . HLD (hyperlipidemia) 03/27/2011    Jamey Reas PT, DPT 06/12/2019, 9:51 PM  [facility] [address] [place], [place], [address] Phone: [phone]   Fax:  [phone]  Name: [name] MRN: [id number] Date of Birth: 1930/08/02

## 2019-06-12 NOTE — Telephone Encounter (Signed)
55f 58.1kg Scr 1.42 03/01/19 Lovw/kilroy 0/9/29 Duplicate Medication/Therapy: Eliquis HEPARINS AND RELATED, ANTICOAGULANTS pharmd review this interference please

## 2019-06-12 NOTE — Telephone Encounter (Signed)
Pt is a 85 yr old female who had a Telemed visit on 03/20/19 with PA. Last noted weight on 04/11/19 was 58.1Kg. SCr on 03/01/19 was 1.42. Will refill Eliquis 2.5mg  BID.

## 2019-06-13 ENCOUNTER — Ambulatory Visit: Payer: Medicare Other | Admitting: Speech Pathology

## 2019-06-13 DIAGNOSIS — R482 Apraxia: Secondary | ICD-10-CM | POA: Diagnosis not present

## 2019-06-13 DIAGNOSIS — R4701 Aphasia: Secondary | ICD-10-CM

## 2019-06-13 DIAGNOSIS — R41841 Cognitive communication deficit: Secondary | ICD-10-CM | POA: Diagnosis not present

## 2019-06-13 DIAGNOSIS — M6281 Muscle weakness (generalized): Secondary | ICD-10-CM | POA: Diagnosis not present

## 2019-06-13 DIAGNOSIS — R2681 Unsteadiness on feet: Secondary | ICD-10-CM | POA: Diagnosis not present

## 2019-06-13 DIAGNOSIS — R2689 Other abnormalities of gait and mobility: Secondary | ICD-10-CM | POA: Diagnosis not present

## 2019-06-13 NOTE — Therapy (Signed)
[facility] [address] [place], [place], [address] Phone: [phone]   Fax:  [phone]  Speech Language Pathology Treatment and Discharge Summary  Patient Details  Name: [name] MRN: [id number] Date of Birth: 1929-10-30 Referring Provider (SLP): Dr. Letta Pate   Encounter Date: 06/13/2019  End of Session - 06/13/19 1641    Visit Number  25    Number of Visits  26    Date for SLP Re-Evaluation  06/19/19    Authorization Type  MCR    SLP Start Time  1531    SLP Stop Time   1615    SLP Time Calculation (min)  44 min    Activity Tolerance  Patient tolerated treatment well       Past Medical History:  Diagnosis Date  . Atrial fibrillation ([place])   . Chronic anticoagulation   . Chronic anticoagulation   . CVA (cerebrovascular accident) ([place])   . Diabetes mellitus    pt & husband deny any knowledge of this   . History of chicken pox   . Hyperlipidemia   . Hypertension     Past Surgical History:  Procedure Laterality Date  . APPENDECTOMY    . BACK SURGERY    . CARDIAC CATHETERIZATION  11/09/91   EF 70%  . DILATION AND CURETTAGE OF UTERUS    . IR PERCUTANEOUS ART THROMBECTOMY/INFUSION INTRACRANIAL INC DIAG ANGIO  10/03/2018  . RADIOLOGY WITH ANESTHESIA N/A 10/03/2018   Procedure: IR WITH ANESTHESIA;  Surgeon: Radiologist, Medication, MD;  Location: [facility];  Service: Radiology;  Laterality: N/A;    There were no vitals filed for this visit.  Subjective Assessment - 06/13/19 1531    Subjective  "I took part of it back in June."    Patient is accompained by:  Family member   husband [name]   Currently in Pain?  Yes    Pain Score  7     Pain Location  Back            ADULT SLP TREATMENT - 06/13/19 1532      General Information   Behavior/Cognition  Alert;Cooperative;Requires cueing      Treatment Provided   Treatment provided  Cognitive-Linquistic      Cognitive-Linquistic  Treatment   Treatment focused on  Aphasia;Apraxia    Skilled Treatment  Pt's simple conversation re: conclusion of PT yesterday was intelligible for this clinician, with use of supported conversation techniques. Pt was tearful and confirmed she was discouraged that she was "still bad," comparing her recovery to her recovery from previous stroke. SLP provided education to pt and husband re: neuroplasticity and pt's prognosis for return to pt's "normal" communication, explaining that pt/husband will need to continue to use compensatory strategies and multimodal communication in order to successfully relay her message. Explained that while pt may continue to improve her speech, rate of recovery slows with time. Pt/husband report improved ability to communicate compared with beginning of ST, and SLP praised pt and husband for their continued efforts. Husband reports that when breakdowns occur, he is asking questions, and using gestures or images to assist pt in communicating her message. SLP continued this session to reinforce need to focus on quality of message received vs rote speech/pronunciation. Pt and husband in agreement with d/c at this time; provided extensive education and handout re: how to continue to assist pt at home. Advised that if additional strategies needed, can request ST referral from Dr. Letta Pate in another  6 months.       Assessment / Recommendations / Plan   Plan  Discharge SLP treatment due to (comment)   max rehab potential achieved     Progression Toward Goals   Progression toward goals  --   Goals partially met, education completed, pt d/c      SLP Education - 06/13/19 1640    Education Details  no "drilling" or "quizzing" patient, focus on the message, supportive conversation and multimodal communication strategies    Person(s) Educated  Patient;Spouse    Methods  Explanation;Demonstration;Verbal cues;Handout    Comprehension  Verbalized understanding;Returned  demonstration       SLP Short Term Goals - 06/13/19 1628      SLP SHORT TERM GOAL #1   Title  Pt will demo awareness of 50% of errors in a functional task with usual mod A (verbal/written cues).     Status  Partially Met      SLP SHORT TERM GOAL #2   Title  Pt will communicate preferences or needs (food, activities, medical etc) x 3 sessions with usual mod A (AAC if necessary).    Baseline  11-25-18 (vegetables), 12-12-18 (fruits, vegetables, tv dinners)    Status  Partially Met      SLP SHORT TERM GOAL #3   Title  Pt will communicate personal details (medical history, family, name, age, etc) with usual mod A x 3 sessions (AAC if necessary)    Baseline  pain level 12-12-18, 12/19/18    Status  Partially Met      SLP SHORT TERM GOAL #4   Title  When given verbal or written cues of speech errors, pt will attempt to rephrase or use AAC to clarify her message with usual mod A x 3 sessions.    Time  1    Period  Weeks    Status  Not Met      SLP SHORT TERM GOAL #5   Title  Pt will use AAC to communicate a request (food, assistance, clothing) outside of ST session with assistance from spouse (per pt/spouse report).    Time  1    Period  Weeks    Status  Not Met       SLP Long Term Goals - 06/13/19 1628      SLP LONG TERM GOAL #1   Title  Pt will attempt error correction in 3/5 opportunities when cued appropriately by communication partner x 3 sessions.    Baseline  word level 12/27/18    Time  5   renewed 03/21/19   Period  Weeks    Status  Deferred   focus on functional communication with pt/spouse     SLP LONG TERM GOAL #2   Title  Family will demo understanding of at least 2 appropriate ways to enhance pt's comprehension/expression during 4 sessions.    Baseline  06/06/19 06/13/19    Time  4   renewed 03/21/19   Period  Weeks    Status  Partially Met      SLP LONG TERM GOAL #3   Title  Pt will reduce social isolation risks by communicating social introductions, common social  messages, and personal interests using multimodal communication with occasional min A from communication partner over 4 sessions.    Time  4    Period  Weeks    Status  Deferred      SLP LONG TERM GOAL #4   Title  Pt/family will report improvement in  understanding of pt's needs and requests than prior to ST.    Time  2   renewed 03/21/19   Period  Weeks    Status  Achieved       Plan - 06/13/19 1642    Clinical Impression Statement  Patient continues with severe expressive aphasia, with expressive > receptive deficits. Pt reponse to confrontation of errors continues to be more positive than in previous sessions, and she has been more willing to use description and gestures during therapy tasks. Level of cuing required commensurate with previous session; anticipate pt will need ongoing cues and partner support for carryover of strategies. Spouse eager to assist but has required repetitive education/instruction re: how to assist patient. Pt and spouse in agreement with d/c today.    Speech Therapy Frequency  --   d/c   Duration  --   d/c   Treatment/Interventions  Cognitive reorganization;Multimodal communcation approach;Compensatory strategies;Language facilitation;Compensatory techniques;Cueing hierarchy;Internal/external aids;Functional tasks;SLP instruction and feedback;Patient/family education    Potential to Achieve Goals  Fair    Potential Considerations  Cooperation/participation level;Severity of impairments;Ability to learn/carryover information    Consulted and Agree with Plan of Care  Patient;Family member/caregiver    Family Member Consulted  Husband [name]       Patient will benefit from skilled therapeutic intervention in order to improve the following deficits and impairments:   Aphasia  Apraxia  Cognitive communication deficit    Problem List Patient Active Problem List   Diagnosis Date Noted  . Cough 03/21/2019  . Expressive aphasia 11/17/2018  . Gait disturbance,  post-stroke 11/17/2018  . Hemiparesis affecting dominant side as late effect of cerebrovascular accident ([facility] [place]) 11/17/2018  . Aphasia, post-stroke 11/17/2018  . Aphasia as late effect of stroke 11/08/2018  . 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 Left middle cerebral artery stroke ([place]) 10/06/2018  . Dyslipidemia   . Diabetes mellitus type 2 in nonobese ([facility])   . Atrial fibrillation ([place])   . Tachypnea   . Stage 3 chronic kidney disease ([place])   . Stroke [facility]) ischemic embolic L MCA  d/t AF 64/15/8309  . 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 Frequent falls 10/13/2017  . Vitamin D deficiency 04/22/2017  . Slurring of speech 03/31/2017  . Back pain 09/14/2016  . Compression fracture of thoracic vertebra ([facility]) 09/14/2016  . S/P vertebroplasty 09/14/2016  . Chronic diastolic heart failure ([place]) 09/14/2016  . HTN (hypertension) 09/14/2016  . History of CVA (cerebrovascular accident) 09/14/2016  . Hypokalemia 09/11/2016  . Paraspinal hematoma 09/11/2016  . Osteoporosis 05/27/2016  . Hypothyroidism 09/11/2014  . Persistent atrial fibrillation ([place]) 12/15/2012  . HLD (hyperlipidemia) 03/27/2011   SPEECH THERAPY DISCHARGE SUMMARY  Visits from Start of Care: 25  Current functional level related to goals / functional outcomes: Pt reponse to confrontation of errors more positive than in previous sessions, though she continues to require moderate cues to use description and gestures during therapy tasks.   Remaining deficits: Severe aphasia persists, with greater expressive than receptive deficits.   Education / Equipment: Activities, communication strategies, and multimodal strategies to support pt's communication at home.  Plan: Patient agrees to discharge.  Patient goals were partially met. Patient is being discharged due to being pleased with the current functional level.  ?????   Deneise Lever, MS, [place] 06/13/2019, 4:43 PM  [facility] [address] [place],  [place], [address] Phone: [phone]   Fax:  [phone]   Name: Hellena Pridgen MRN: [id number] Date of Birth: 06/22/1930

## 2019-06-13 NOTE — Patient Instructions (Signed)
Keep using the strategies you've learned to help communication between the two of you. Remember to focus on the message or point, NOT the exact words. If you need a refresher on communication strategies in another 6 months, you can ask Dr. Letta Pate for another referral.   If [name] doesn't understand something, let Nakshatra know.  [name] -Ask questions to help you understand her message?  -What does it look like?  -What is it used for?  -Where is it?  - What color/size/shape is it?  -What other words go with it? -Use pictures or written words that Deziah can point to -Confirm competence: let Arella know what you DO understand. -Rephrase what she is telling you and wait for her to confirm, to make sure you are on the same page  [place] -Slow down -Try to describe or say in a different way

## 2019-07-10 ENCOUNTER — Encounter: Payer: Medicare Other | Admitting: Physical Medicine & Rehabilitation

## 2019-07-11 ENCOUNTER — Other Ambulatory Visit: Payer: Self-pay

## 2019-07-11 ENCOUNTER — Encounter: Payer: Medicare Other | Attending: Physical Medicine & Rehabilitation | Admitting: Physical Medicine & Rehabilitation

## 2019-07-11 ENCOUNTER — Encounter: Payer: Self-pay | Admitting: Physical Medicine & Rehabilitation

## 2019-07-11 VITALS — BP 115/61 | HR 60 | Temp 97.0°F | Ht 63.0 in | Wt 115.0 lb

## 2019-07-11 DIAGNOSIS — I69320 Aphasia following cerebral infarction: Secondary | ICD-10-CM | POA: Insufficient documentation

## 2019-07-11 DIAGNOSIS — I63512 Cerebral infarction due to unspecified occlusion or stenosis of left middle cerebral artery: Secondary | ICD-10-CM

## 2019-07-11 DIAGNOSIS — I69390 Apraxia following cerebral infarction: Secondary | ICD-10-CM | POA: Insufficient documentation

## 2019-07-11 NOTE — Progress Notes (Signed)
Subjective:    Patient ID: [name], female    DOB: [birth year], 85 y.o.   MRN: [id number]  HPI Abdominal pain after twisting denies any bowel or bladder issues.  No falls or trauma.  Denies back pain she is aphasic,  yes no responses are not always accurate  Trouble with naming and with numbers Patient becomes frustrated when she cannot express herself. Reviewed physical therapy notes. Husband states that speech at times is fluent and she gets her words out Pain Inventory Average Pain 7 Pain Right Now 7 My pain is sharp and aching  In the last 24 hours, has pain interfered with the following? General activity 7 Relation with others 7 Enjoyment of life 7 What TIME of day is your pain at its worst? daytime Sleep (in general) Poor  Pain is worse with: walking and bending Pain improves with: rest Relief from Meds: 1  Mobility walk without assistance use a walker ability to climb steps?  no do you drive?  no  Function retired I need assistance with the following:  meal prep, household duties and shopping  Neuro/Psych trouble walking  Prior Studies Any changes since last visit?  no  Physicians involved in your care Any changes since last visit?  no   Family History  Problem Relation Age of Onset  . Heart disease Mother   . Hypertension Mother   . Stroke Mother   . Heart disease Father   . Heart failure Brother   . Osteoporosis Neg Hx    Social History   Socioeconomic History  . Marital status: Married    Spouse name: Not on file  . Number of children: 2  . Years of education: Not on file  . Highest education level: High school graduate  Occupational History  . Not on file  Social Needs  . Financial resource strain: Not on file  . Food insecurity    Worry: Not on file    Inability: Not on file  . Transportation needs    Medical: Not on file    Non-medical: Not on file  Tobacco Use  . Smoking status: Former Smoker    Quit date:  10/19/1998    Years since quitting: 20.7  . Smokeless tobacco: Never Used  Substance and Sexual Activity  . Alcohol use: No  . Drug use: No  . Sexual activity: Not on file  Lifestyle  . Physical activity    Days per week: Not on file    Minutes per session: Not on file  . Stress: Not on file  Relationships  . Social Musician on phone: Not on file    Gets together: Not on file    Attends religious service: Not on file    Active member of club or organization: Not on file    Attends meetings of clubs or organizations: Not on file    Relationship status: Not on file  Other Topics Concern  . Not on file  Social History Narrative   Lives at home with her husband    Right handed   Caffeine: sometimes coffee   Past Surgical History:  Procedure Laterality Date  . APPENDECTOMY    . BACK SURGERY    . CARDIAC CATHETERIZATION  11/09/91   EF 70%  . DILATION AND CURETTAGE OF UTERUS    . IR PERCUTANEOUS ART THROMBECTOMY/INFUSION INTRACRANIAL INC DIAG ANGIO  10/03/2018  . RADIOLOGY WITH ANESTHESIA N/A 10/03/2018   Procedure: IR WITH  ANESTHESIA;  Surgeon: Radiologist, Medication, MD;  Location: [facility];  Service: Radiology;  Laterality: N/A;   Past Medical History:  Diagnosis Date  . Atrial fibrillation ([place])   . Chronic anticoagulation   . Chronic anticoagulation   . CVA (cerebrovascular accident) ([place])   . Diabetes mellitus    pt & husband deny any knowledge of this   . History of chicken pox   . Hyperlipidemia   . Hypertension    BP 115/61   Pulse 60   Temp (!) 97 F (36.1 C)   Ht 5\' 3"  (1.6 m)   Wt 115 lb (52.2 kg)   SpO2 95%   BMI 20.37 kg/m   Opioid Risk Score:   Fall Risk Score:  `1  Depression screen PHQ 2/9  Depression screen [facility] 2/9 11/08/2018 10/13/2017 09/24/2016 04/29/2016  Decreased Interest 0 0 0 0  Down, Depressed, Hopeless 1 0 0 0  PHQ - 2 Score 1 0 0 0  Altered sleeping 2 - 0 -  Tired, decreased energy 2 - 0 -  Change in appetite 0 - 0 -   Feeling bad or failure about yourself  3 - 0 -  Trouble concentrating 0 - 0 -  Moving slowly or fidgety/restless 3 - 0 -  Suicidal thoughts 0 - 0 -  PHQ-9 Score 11 - 0 -  Difficult doing work/chores Very difficult - - -  Some recent data might be hidden     Review of Systems  Constitutional: Negative.   HENT: Negative.   Eyes: Negative.   Respiratory: Negative.   Cardiovascular: Negative.   Gastrointestinal: Negative.   Endocrine: Negative.   Genitourinary: Negative.   Musculoskeletal: Positive for arthralgias, gait problem and myalgias.  Skin: Negative.   Allergic/Immunologic: Negative.   Hematological: Negative.   Psychiatric/Behavioral: Negative.   All other systems reviewed and are negative.      Objective:   Physical Exam Vitals signs and nursing note reviewed.  Constitutional:      Appearance: Normal appearance.  Eyes:     Extraocular Movements: Extraocular movements intact.     Conjunctiva/sclera: Conjunctivae normal.     Pupils: Pupils are equal, round, and reactive to light.  Cardiovascular:     Rate and Rhythm: Normal rate. Rhythm irregular.  Pulmonary:     Effort: Pulmonary effort is normal.     Breath sounds: Normal breath sounds. No stridor. No wheezing.  Abdominal:     General: Abdomen is flat. Bowel sounds are normal. There is no distension.     Palpations: Abdomen is soft. There is no mass.  Skin:    General: Skin is warm and dry.  Neurological:     General: No focal deficit present.     Mental Status: She is alert. Mental status is at baseline.     Cranial Nerves: No dysarthria.     Comments: Aphasia complicates cognitive testing  Psychiatric:        Mood and Affect: Mood normal.        Behavior: Behavior normal.     Able to state first name Able to state her daughter's name but not her grandkids names Difficulty with repetition Can name ring and pen but not stethoscope Patient is able to follow manual muscle testing with some minimal  visual cueing. Her motor strength is 5/5 in bilateral deltoid, bicep, tricep, grip, hip flexor, knee extensor ankle dorsiflexor Sensation difficult to assess secondary to aphasia.  She states she can feel but it feels  different than on the left side. Gait was not tested.       Assessment & Plan:  1.  Left MCA distribution infarct with residual apraxia, Wernicke's aphasia, reduced balance.  Overall she is making a good recovery.  We discussed that she may make additional improvements over the next 3 months.  I would like to see her back in 3 months.  We can decide whether another round of speech therapy may be needed at that time.  We discussed naming objects in the room and saying them out loud for  Practice. 2  sensory deficits due to CVA, do not appear to be painful at this time 3.  Complains of abdominal pain with normal abdominal examination normal thoracic and lumbar spine examination.  She has had no bowel or bladder changes.  She will follow-up with her primary physician on this if it persists

## 2019-07-11 NOTE — Patient Instructions (Signed)
Work on [employer] out loud.

## 2019-08-23 ENCOUNTER — Telehealth: Payer: Self-pay | Admitting: *Deleted

## 2019-08-23 DIAGNOSIS — E039 Hypothyroidism, unspecified: Secondary | ICD-10-CM | POA: Diagnosis not present

## 2019-08-23 DIAGNOSIS — I4821 Permanent atrial fibrillation: Secondary | ICD-10-CM | POA: Diagnosis not present

## 2019-08-23 DIAGNOSIS — K5909 Other constipation: Secondary | ICD-10-CM | POA: Diagnosis not present

## 2019-08-23 DIAGNOSIS — I679 Cerebrovascular disease, unspecified: Secondary | ICD-10-CM | POA: Diagnosis not present

## 2019-08-23 DIAGNOSIS — I5032 Chronic diastolic (congestive) heart failure: Secondary | ICD-10-CM | POA: Diagnosis not present

## 2019-08-23 DIAGNOSIS — D6869 Other thrombophilia: Secondary | ICD-10-CM | POA: Diagnosis not present

## 2019-08-23 DIAGNOSIS — Z23 Encounter for immunization: Secondary | ICD-10-CM | POA: Diagnosis not present

## 2019-08-23 DIAGNOSIS — I110 Hypertensive heart disease with heart failure: Secondary | ICD-10-CM | POA: Diagnosis not present

## 2019-08-23 DIAGNOSIS — M810 Age-related osteoporosis without current pathological fracture: Secondary | ICD-10-CM | POA: Diagnosis not present

## 2019-08-23 DIAGNOSIS — E559 Vitamin D deficiency, unspecified: Secondary | ICD-10-CM | POA: Diagnosis not present

## 2019-08-23 DIAGNOSIS — I69998 Other sequelae following unspecified cerebrovascular disease: Secondary | ICD-10-CM | POA: Diagnosis not present

## 2019-08-23 DIAGNOSIS — M549 Dorsalgia, unspecified: Secondary | ICD-10-CM | POA: Diagnosis not present

## 2019-08-23 DIAGNOSIS — Z7901 Long term (current) use of anticoagulants: Secondary | ICD-10-CM | POA: Diagnosis not present

## 2019-08-23 NOTE — Telephone Encounter (Signed)
Medication Samples have been placed at front desk file cabinet for pick up  Drug name: ELIQUIS      Strength: 2.5 mg        Qty: 3 boxes  LOT: [id number]  Exp.Date: DEC 2021   [name] [name] 10:01 AM 08/23/2019

## 2019-08-23 NOTE — Telephone Encounter (Signed)
Spoke with husband and he was wanting to know if any Eliquis 2.5 mg samples advised at front for pick up

## 2019-10-10 ENCOUNTER — Encounter: Payer: Medicare Other | Admitting: Physical Medicine & Rehabilitation

## 2019-10-31 ENCOUNTER — Ambulatory Visit: Payer: Medicare Other | Admitting: Cardiovascular Disease

## 2019-12-23 ENCOUNTER — Ambulatory Visit: Payer: Medicare Other | Attending: Internal Medicine

## 2019-12-23 DIAGNOSIS — Z23 Encounter for immunization: Secondary | ICD-10-CM | POA: Insufficient documentation

## 2019-12-23 NOTE — Progress Notes (Signed)
   Covid-19 Vaccination Clinic  Name:  [name]    MRN: [id number] DOB: May 15, 1930  12/23/2019  [name] was observed post Covid-19 immunization for 15 minutes without incident. She was provided with Vaccine Information Sheet and instruction to access the V-Safe system.   [name] was instructed to call 911 with any severe reactions post vaccine: Marland Kitchen Difficulty breathing  . Swelling of face and throat  . A fast heartbeat  . A bad rash all over body  . Dizziness and weakness   Immunizations Administered    Name Date Dose VIS Date Route   Pfizer COVID-19 Vaccine 12/23/2019 10:27 AM 0.3 mL 09/29/2019 Intramuscular   Manufacturer: [employer], [employer]   Lot: [id number]   NDC: [address]-1

## 2020-01-02 DIAGNOSIS — I679 Cerebrovascular disease, unspecified: Secondary | ICD-10-CM | POA: Diagnosis not present

## 2020-01-02 DIAGNOSIS — K5909 Other constipation: Secondary | ICD-10-CM | POA: Diagnosis not present

## 2020-01-02 DIAGNOSIS — D6869 Other thrombophilia: Secondary | ICD-10-CM | POA: Diagnosis not present

## 2020-01-02 DIAGNOSIS — M810 Age-related osteoporosis without current pathological fracture: Secondary | ICD-10-CM | POA: Diagnosis not present

## 2020-01-02 DIAGNOSIS — Z1331 Encounter for screening for depression: Secondary | ICD-10-CM | POA: Diagnosis not present

## 2020-01-02 DIAGNOSIS — I69998 Other sequelae following unspecified cerebrovascular disease: Secondary | ICD-10-CM | POA: Diagnosis not present

## 2020-01-02 DIAGNOSIS — Z7901 Long term (current) use of anticoagulants: Secondary | ICD-10-CM | POA: Diagnosis not present

## 2020-01-02 DIAGNOSIS — I4821 Permanent atrial fibrillation: Secondary | ICD-10-CM | POA: Diagnosis not present

## 2020-01-02 DIAGNOSIS — I5032 Chronic diastolic (congestive) heart failure: Secondary | ICD-10-CM | POA: Diagnosis not present

## 2020-01-02 DIAGNOSIS — I110 Hypertensive heart disease with heart failure: Secondary | ICD-10-CM | POA: Diagnosis not present

## 2020-01-02 DIAGNOSIS — E559 Vitamin D deficiency, unspecified: Secondary | ICD-10-CM | POA: Diagnosis not present

## 2020-01-02 DIAGNOSIS — E039 Hypothyroidism, unspecified: Secondary | ICD-10-CM | POA: Diagnosis not present

## 2020-01-02 DIAGNOSIS — E7800 Pure hypercholesterolemia, unspecified: Secondary | ICD-10-CM | POA: Diagnosis not present

## 2020-01-09 ENCOUNTER — Ambulatory Visit: Payer: Medicare Other

## 2020-01-15 ENCOUNTER — Ambulatory Visit: Payer: Medicare Other | Attending: Internal Medicine

## 2020-01-15 DIAGNOSIS — Z23 Encounter for immunization: Secondary | ICD-10-CM

## 2020-01-15 NOTE — Progress Notes (Signed)
   Covid-19 Vaccination Clinic  Name:  Ramonica Grigg    MRN: [id number] DOB: [birth year]  01/15/2020  Ms. White Raymon Mutton was observed post Covid-19 immunization for 15 minutes without incident. She was provided with Vaccine Information Sheet and instruction to access the V-Safe system.   Ms. Eiliyah Reh was instructed to call 911 with any severe reactions post vaccine: Marland Kitchen Difficulty breathing  . Swelling of face and throat  . A fast heartbeat  . A bad rash all over body  . Dizziness and weakness   Immunizations Administered    Name Date Dose VIS Date Route   Pfizer COVID-19 Vaccine 01/15/2020 10:16 AM 0.3 mL 09/29/2019 Intramuscular   Manufacturer: [employer], [employer]   Lot: [id number]   NDC: [address]-9

## 2020-02-06 ENCOUNTER — Telehealth: Payer: Self-pay | Admitting: Cardiovascular Disease

## 2020-02-06 NOTE — Telephone Encounter (Signed)
New  Message    [name] is calling and says she needs to assist the pt to her appt because she has had a stroke and has memory issues    Please advise

## 2020-02-06 NOTE — Telephone Encounter (Signed)
Spoke with Aram Beecham and made her aware that I have placed a note in the computer that it's ok for her to come up.  She was appreciative for call.

## 2020-02-09 ENCOUNTER — Other Ambulatory Visit: Payer: Self-pay

## 2020-02-09 ENCOUNTER — Ambulatory Visit (INDEPENDENT_AMBULATORY_CARE_PROVIDER_SITE_OTHER): Payer: Medicare Other | Admitting: Cardiovascular Disease

## 2020-02-09 ENCOUNTER — Encounter: Payer: Self-pay | Admitting: Cardiovascular Disease

## 2020-02-09 VITALS — BP 98/68 | HR 62 | Ht 59.0 in | Wt 127.4 lb

## 2020-02-09 DIAGNOSIS — I4819 Other persistent atrial fibrillation: Secondary | ICD-10-CM

## 2020-02-09 MED ORDER — AMLODIPINE BESYLATE 5 MG PO TABS: 5.0000 mg | ORAL_TABLET | Freq: Every day | ORAL | 3 refills | Status: DC

## 2020-02-09 NOTE — Patient Instructions (Signed)
Medication Instructions:  DECREASE YOUR AMLODIPINE TO 5 MG DAILY   *If you need a refill on your cardiac medications before your next appointment, please call your pharmacy*  Lab Work: NONE  If you have labs (blood work) drawn today and your tests are completely normal, you will receive your results only by: Marland Kitchen MyChart Message (if you have MyChart) OR . A paper copy in the mail If you have any lab test that is abnormal or we need to change your treatment, we will call you to review the results.  Testing/Procedures: NONE  Follow-Up: At [facility], you and your health needs are our priority.  As part of our continuing mission to provide you with exceptional heart care, we have created designated Provider Care Teams.  These Care Teams include your primary Cardiologist (physician) and Advanced Practice Providers (APPs -  Physician Assistants and Nurse Practitioners) who all work together to provide you with the care you need, when you need it.  We recommend signing up for the patient portal called "MyChart".  Sign up information is provided on this After Visit Summary.  MyChart is used to connect with patients for Virtual Visits (Telemedicine).  Patients are able to view lab/test results, encounter notes, upcoming appointments, etc.  Non-urgent messages can be sent to your provider as well.   To learn more about what you can do with MyChart, go to [email or web address].    Your next appointment:   6 month(s)  You will receive a reminder letter in the mail two months in advance. If you don't receive a letter, please call our office to schedule the follow-up appointment.  The format for your next appointment:   In Person  Provider:   You may see Chilton Si, MD or one of the following Advanced Practice Providers on your designated Care Team:    Corine Shelter, PA-C  [place], [place]  Edd Fabian, [place]  Your physician recommends that you schedule a follow-up appointment in:  VIRTUAL VISIT WITH PA/NP/DR [place] IN 2 MONTHS   Other Instructions MONITOR AND LOG YOUR BLOOD PRESSURE AT HOME, HAVE READINGS AVAILABLE AT YOUR 2 MONTH FOLLOW UP

## 2020-02-09 NOTE — Progress Notes (Signed)
Cardiology Office Note   Evaluation Performed:  Follow-up visit  Date:  02/09/2020   ID:  [name], DOB 08-Apr-1930, MRN [id number]  Patient Location: Home Provider Location: Office  PCP:  Gaspar Garbe, MD  Cardiologist:  Chilton Si, MD  Electrophysiologist:  None   Chief Complaint:  Follow up, hypertension  History of Present Illness:    [name] is an 86 y.o. female with chronic diastolic heart failure, persistent atrial fibrillation, stroke on warfarin, expressive aphasia, moderate L subclavian stenosis, hyperlipidemia and hypothyroidism who presents for follow up.  She was previously a patient of Dr. Patty Sermons.  She had a [employer] 08/2015. She was noted to be in atrial fibrillation, but there is no evidence of ischemia.  Echocardiogram 01/2012 revealed LVEF 50-55%.  [name] had a thoracic  vertebral compression fracture and had to undergo kyphoplasty.   Warfarin was held and she was bridged with Lovenox.  This was complicated by a paraspinal hematoma.  She suffered from another vertebral fracture and struggles with pain in her back.  She is not a candidate for any further interventions.  She has been working with pain management and takes oxycodone at night to sleep. She also broken her left wrist right shoulder and just finished physical therapy.   [name] has been struggling with back pain.  She underwent a back procedure that sounds like a kyphoplasty and felt well for about 1 week.  However she then developed problems with the vertebrae above.  She saw a different orthopedic surgeon for second opinion and he recommended that she not have any further procedures but instead proceed with pain management.  She has been seeing a pain management specialist who recommended that she have an injection.  Given her history of bleeding on Lovenox a decision was made not to bridge her.  She held warfarin for 2 days.  On the  second day she developed acute confusion that lasted for several hours.  She started shouting at her husband and asking, "where is my staff?".  She was unable to tell her daughter her name.  Her husband reports that she was leaning to one side and nearly fell.  The family elected to resume her warfarin and the symptoms resolved by the following day.    [name] was admitted 09/2018 with a L MCA stroke.  INR was therapeutic on admission.  IR attempted revascularization unsuccessfully.  Echo that admission revealed LVEF 55 to 60% with akinesis of the basal and mid anteroseptal myocardium.  Warfarin was replaced with Eliquis.    She has residual expressive aphasia.   At her last appointment her blood pressure was poorly controlled so amlodipine was increased. Her BP has been running in the 130s-150s.  She has been feeling well but complains of back and leg pain. She complains of pain in her back that makes it hard to sleep.   She has no lower extremity edmea but does occasionally have orthopnea.  She denies PND.  Her physical and speech therapy have been on hold due to COVID-19.  Lately her blood pressure has been running low and she has some lightheadedness.  She has also noted increased lower extremity edema.  She denies orthopnea or PND.  She sometimes gets some sharp pain in her upper abdomen.  It occurs mostly when she tries to bend over.  She is not getting any exercise because of her pain and instability.  She is  very frustrated by her expressive aphasia.  She is gotten both Covid vaccines.   Past Medical History:  Diagnosis Date  . Atrial fibrillation (HCC)   . Chronic anticoagulation   . Chronic anticoagulation   . CVA (cerebrovascular accident) (HCC)   . Diabetes mellitus    pt & husband deny any knowledge of this   . History of chicken pox   . Hyperlipidemia   . Hypertension    Past Surgical History:  Procedure Laterality Date  . APPENDECTOMY    . BACK SURGERY    . CARDIAC  CATHETERIZATION  11/09/91   EF 70%  . DILATION AND CURETTAGE OF UTERUS    . IR PERCUTANEOUS ART THROMBECTOMY/INFUSION INTRACRANIAL INC DIAG ANGIO  10/03/2018  . RADIOLOGY WITH ANESTHESIA N/A 10/03/2018   Procedure: IR WITH ANESTHESIA;  Surgeon: Radiologist, Medication, MD;  Location: [facility] OR;  Service: Radiology;  Laterality: N/A;     Current Meds  Medication Sig  . acetaminophen (TYLENOL) 325 MG tablet Take 2 tablets (650 mg total) by mouth every 4 (four) hours as needed for mild pain (or temp > 37.5 C (99.5 F)).  Marland Kitchen amLODipine (NORVASC) 5 MG tablet Take 1 tablet (5 mg total) by mouth daily.  Marland Kitchen atorvastatin (LIPITOR) 20 MG tablet Take 1 tablet (20 mg total) by mouth daily.  Marland Kitchen ELIQUIS 2.5 MG TABS tablet Take 1 tablet by mouth twice daily  . levothyroxine (SYNTHROID, LEVOTHROID) 25 MCG tablet Take 1 tablet (25 mcg total) by mouth daily before breakfast.  . nadolol (CORGARD) 20 MG tablet Take 1/2 (one-half) tablet by mouth once daily  . nitroGLYCERIN (NITROSTAT) 0.4 MG SL tablet Place 0.4 mg under the tongue every 5 (five) minutes as needed (chest pain).   . pantoprazole (PROTONIX) 40 MG tablet Take 1 tablet (40 mg total) by mouth daily.  . [DISCONTINUED] amLODipine (NORVASC) 10 MG tablet Take 1 tablet (10 mg total) by mouth daily.  . [DISCONTINUED] ELIQUIS 2.5 MG TABS tablet      Allergies:   Cozaar, Hydralazine, Hyzaar [losartan potassium-hctz], Sulfa drugs cross reactors, and Lisinopril   Social History   Tobacco Use  . Smoking status: Former Smoker    Quit date: 10/19/1998    Years since quitting: 21.3  . Smokeless tobacco: Never Used  Substance Use Topics  . Alcohol use: No  . Drug use: No     Family Hx: The patient's family history includes Heart disease in her father and mother; Heart failure in her brother; Hypertension in her mother; Stroke in her mother. There is no history of Osteoporosis.  ROS:   Please see the history of present illness.     All other systems reviewed  and are negative.   Prior CV studies:   The following studies were reviewed today:  Echo 10/04/18: Study Conclusions  - Left ventricle: The cavity size was normal. There was mild concentric hypertrophy. Systolic function was normal. The estimated ejection fraction was in the range of 55% to 60%. Akinesis of the basal and mid anteroseptal walls. - Aortic valve: There was mild regurgitation. - Mitral valve: There was mild regurgitation. - Left atrium: The atrium was mildly dilated. - Right ventricle: The cavity size was normal. Wall thickness was normal. Systolic function was normal. - Tricuspid valve: There was mild regurgitation. - Pulmonic valve: There was no regurgitation. - Pulmonary arteries: Systolic pressure was within the normal range. - Inferior vena cava: The vessel was normal in size. - Pericardium, extracardiac: There was no pericardial  effusion.   Labs/Other Tests and Data Reviewed:    EKG:  An ECG dated 02/09/20 was personally reviewed today and demonstrated:  sinus rhythm.  Rate 62 bpm.  First degree AV block.  LAD.   4/232/21: Sinus rhythm.  Rate 62 bpm.  First-degree AV block.  Left axis deviation.  Prior inferior infarct.  Recent Labs: 03/01/2019: ALT 20; BUN 24; Creatinine, Ser 1.42; Potassium 4.9; Sodium 136   Recent Lipid Panel Lab Results  Component Value Date/Time   CHOL 166 03/01/2019 02:56 PM   TRIG 80 03/01/2019 02:56 PM   HDL 76 03/01/2019 02:56 PM   CHOLHDL 2.2 03/01/2019 02:56 PM   CHOLHDL 4.0 10/04/2018 05:10 AM   LDLCALC 74 03/01/2019 02:56 PM   LDLCALC 78 10/13/2017 04:58 PM    Wt Readings from Last 3 Encounters:  02/09/20 127 lb 6.4 oz (57.8 kg)  07/11/19 115 lb (52.2 kg)  04/11/19 128 lb (58.1 kg)     Objective:    VS:  BP 98/68   Pulse 62   Ht 4\' 11"  (1.499 m)   Wt 127 lb 6.4 oz (57.8 kg)   SpO2 96%   BMI 25.73 kg/m  , BMI Body mass index is 25.73 kg/m. GENERAL:  Well appearing HEENT: Pupils equal round and  reactive, fundi not visualized, oral mucosa unremarkable NECK:  No jugular venous distention, waveform within normal limits, carotid upstroke brisk and symmetric, no bruits LUNGS:  Clear to auscultation bilaterally HEART:  RRR.  PMI not displaced or sustained,S1 and S2 within normal limits, no S3, no S4, no clicks, no rubs, no murmurs ABD:  Flat, positive bowel sounds normal in frequency in pitch, no bruits, no rebound, no guarding, no midline pulsatile mass, no hepatomegaly, no splenomegaly EXT:  2 plus pulses throughout, 1+ LE edema to above the ankles bilaterally, no cyanosis no clubbing SKIN:  No rashes no nodules NEURO:  Expressive aphasia PSYCH:  Cognitively intact, oriented to person place and time    ASSESSMENT & PLAN:    # Persistent atrial fibrillation/flutter::  She is currently in sinus rhythm.  Continue Eliquis and nadolol.  She had a stroke on warfarin despite a therapeutic INR.  # CVA: She will resume speech therapy after COVID-19.  Continue follow up with Neurology.   # Hypertension:  Blood pressure now low after increasing amlodipine.  She also has lower extremity edema.  We will reduce the dose back to 5 mg.  Continue nadolol.  # Hyperlipidemia: Lipids well-controlled.  Continue atorvastatin.  Dr. Osborne Casco checks this regularly.     Time:   Today, I have spent 20 minutes with the patient with telehealth technology discussing the above problems.     Medication Adjustments/Labs and Tests Ordered: Current medicines are reviewed at length with the patient today.  Concerns regarding medicines are outlined above.   Tests Ordered: Orders Placed This Encounter  Procedures  . EKG 12-Lead    Medication Changes: Meds ordered this encounter  Medications  . amLODipine (NORVASC) 5 MG tablet    Sig: Take 1 tablet (5 mg total) by mouth daily.    Dispense:  90 tablet    Refill:  3    NEW DOSE, D/C PREVIOUS RX PATIENT WILL CALL WHEN NEEDS FILLED    Disposition:   Follow up with APP in 2 months.  Follow up with Kiegan Macaraeg C. Oval Linsey, MD, [facility] in 6 months.   Signed, Skeet Latch, MD  02/09/2020 5:40 PM    [facility]  [facility]

## 2020-02-25 ENCOUNTER — Other Ambulatory Visit: Payer: Self-pay | Admitting: Cardiovascular Disease

## 2020-02-27 ENCOUNTER — Other Ambulatory Visit: Payer: Self-pay | Admitting: Pharmacist

## 2020-02-27 MED ORDER — APIXABAN 2.5 MG PO TABS: 2.5000 mg | ORAL_TABLET | Freq: Two times a day (BID) | ORAL | 1 refills | Status: DC

## 2020-03-05 IMAGING — CT CT T SPINE W/O CM
1 series · 12 of 14 positions shown, 15 images · non-contrast
Comparison: MR thoracic spine 08/11/2016

CLINICAL DATA: Chronic bilateral thoracic pain.

EXAM:
CT THORACIC SPINE WITHOUT CONTRAST
TECHNIQUE: Multidetector CT images of the thoracic were obtained using the
standard protocol without intravenous contrast.

[Series 3: t spine soft · axial · 0.36mm/px · z∈[-87,+159]mm · 12 of 98 slices shown, 15 images]
[im 8/98  soft-tissue]
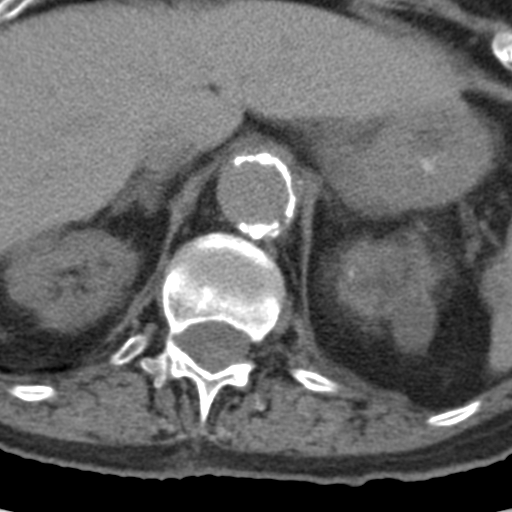
[im 8/98  bone]
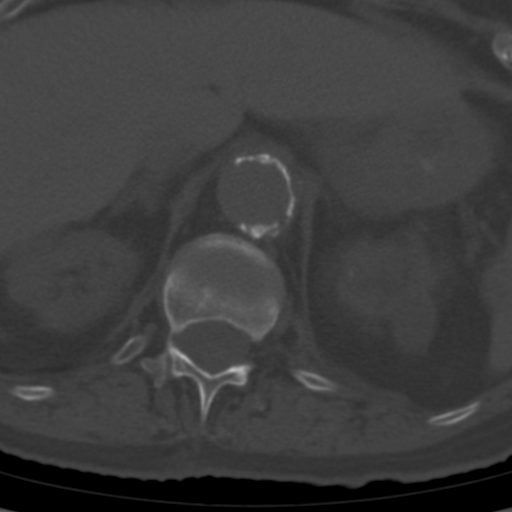
[im 15/98  bone]
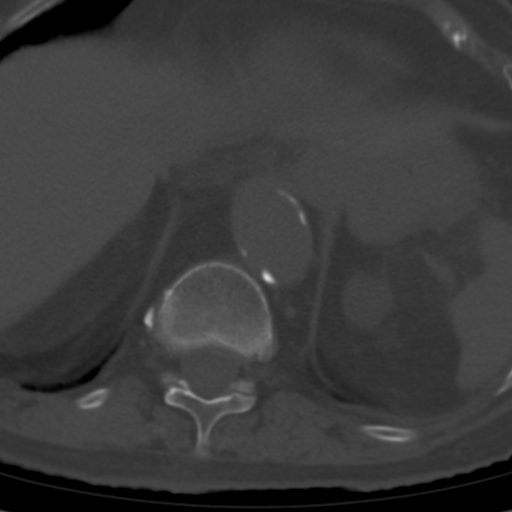
[im 23/98  bone]
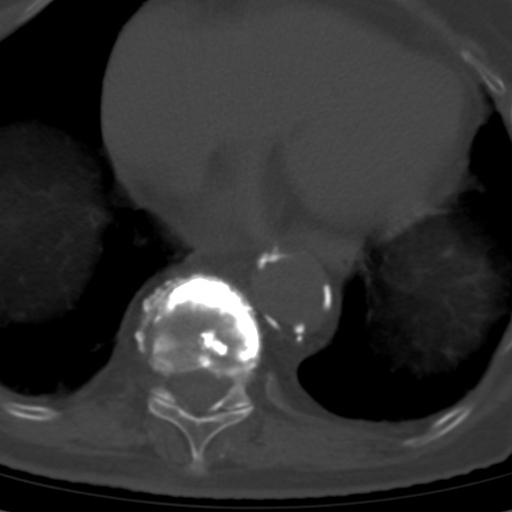
[im 30/98  bone]
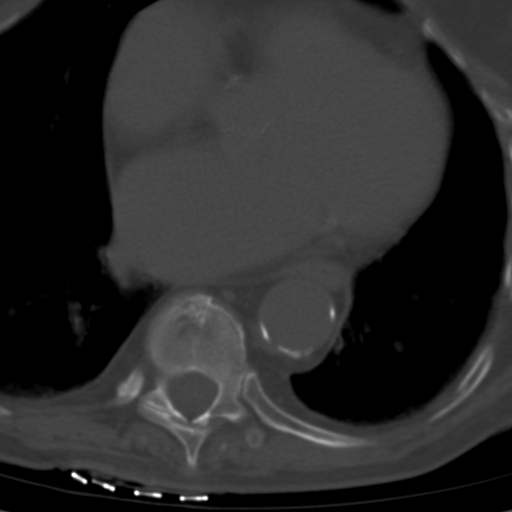
[im 38/98  soft-tissue]
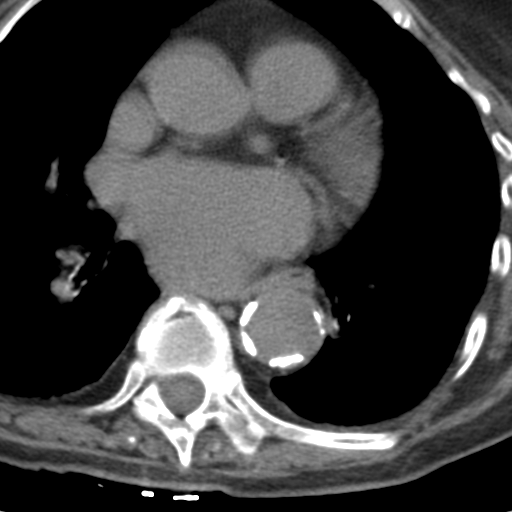
[im 38/98  bone]
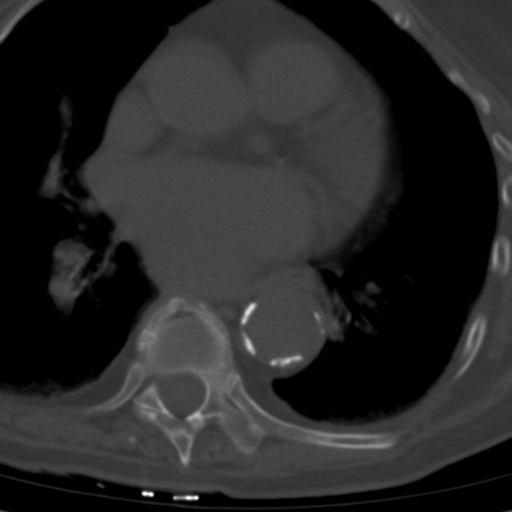
[im 45/98  bone]
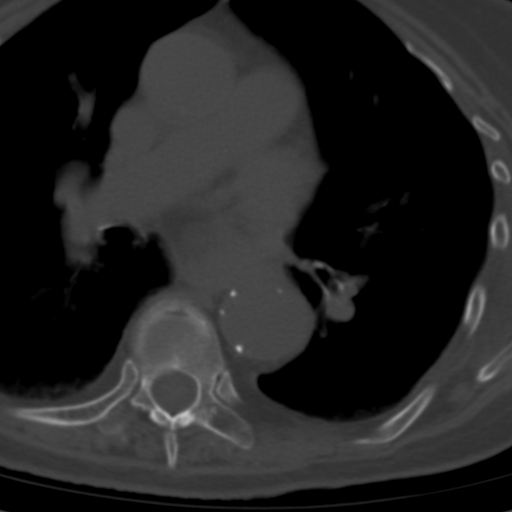
[im 53/98  bone]
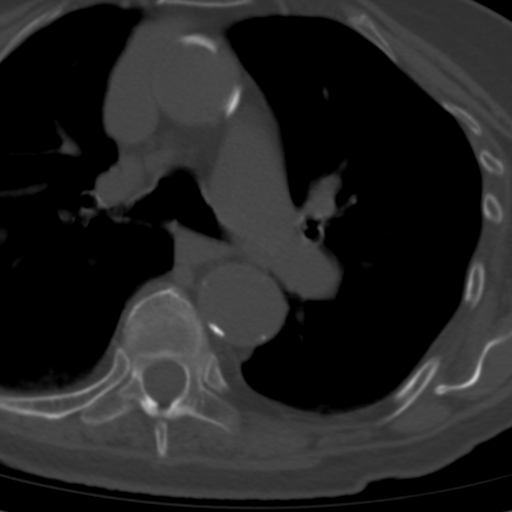
[im 60/98  bone]
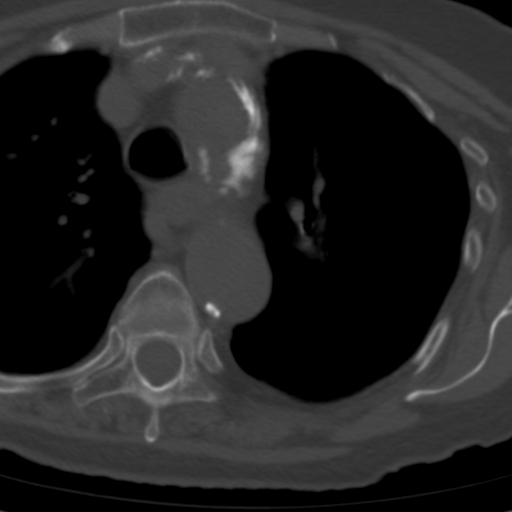
[im 68/98  soft-tissue]
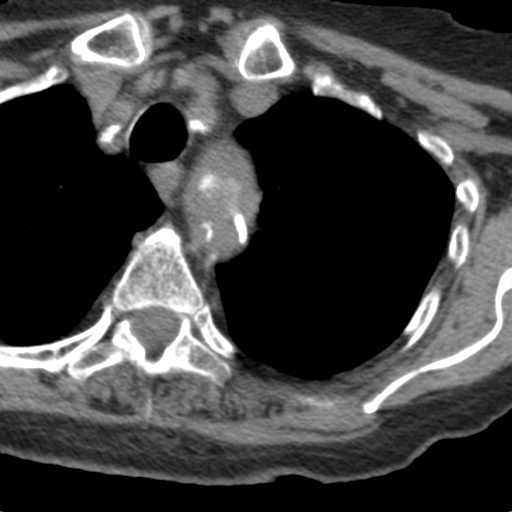
[im 68/98  bone]
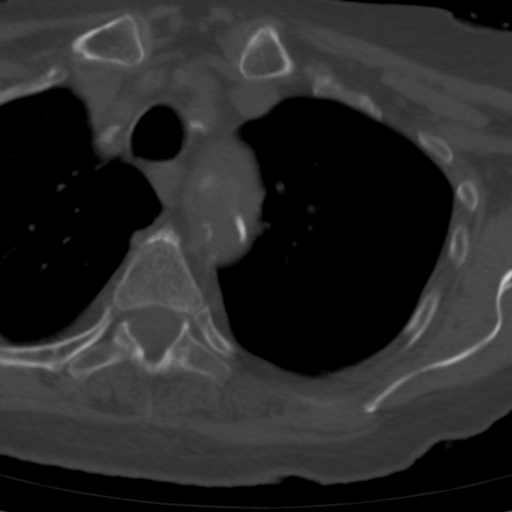
[im 75/98  bone]
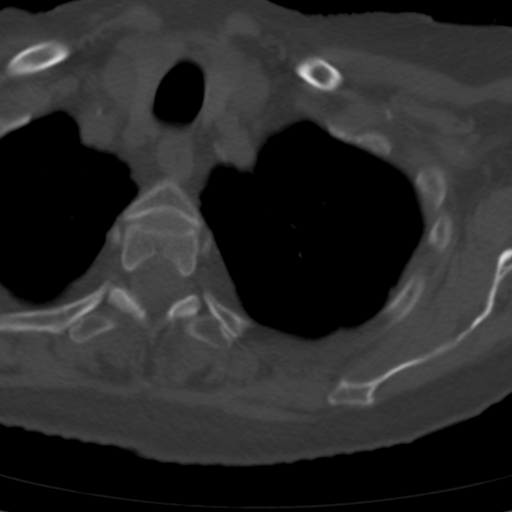
[im 83/98  bone]
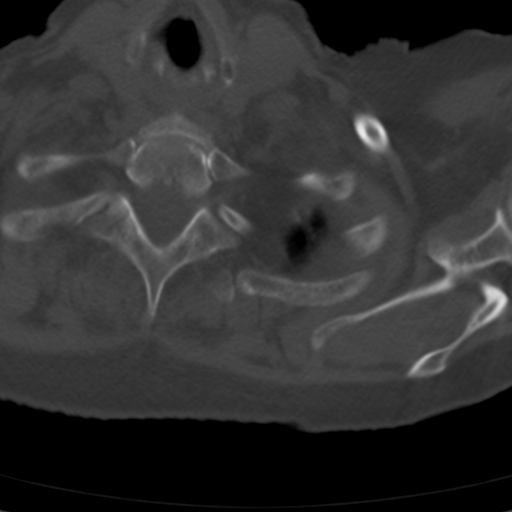
[im 90/98  bone]
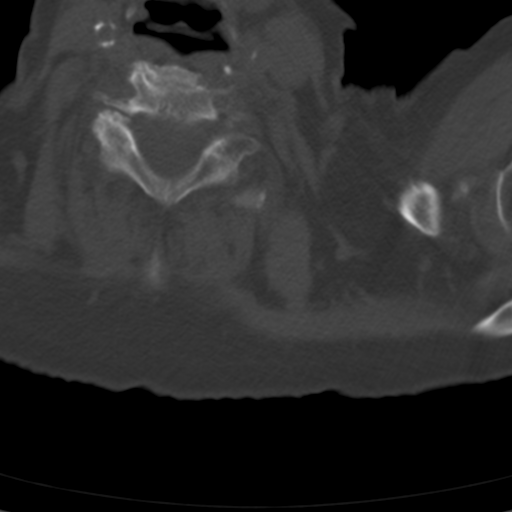

[12 of 14 positions shown; findings below may reference images not displayed]

FINDINGS: Alignment: 3 mm anterolisthesis of C4 on C5.

Vertebrae: No acute vertebral compression fracture. No discitis or
osteomyelitis. No aggressive osseous lesion. Chronic T3, T4 and T10
vertebral body compression fractures. Chronic T11 vertebral body
compression fracture with 50% height loss and methylmethacrylate
within the vertebral body from prior augmentation.
Methylmethacrylate extends into the T10-11 disc space and extrudes
into the epidural space with caudal migration along the right
paracentral aspect of the T10 vertebral body resulting in mild canal
narrowing.

Paraspinal and other soft tissues: No acute paraspinal abnormality.
Thoracic aortic atherosclerosis.

Disc levels: Degenerative disc disease with disc height loss and
disc osteophyte complex at C5-6. Degenerative disc disease with disc
height loss at T9-10, T10-11. mild right foraminal narrowing at
T10-11. Otherwise no foraminal or central canal stenosis.
IMPRESSION: 1. No acute injury of the thoracic spine.
2. Chronic T3, T4 and T10 vertebral body compression fractures.
Chronic T11 vertebral body compression fracture with 50% height loss
and methylmethacrylate within the vertebral body from prior
augmentation. Methylmethacrylate extends into the T10-11 disc space
and extrudes into the epidural space with caudal migration along the
right paracentral aspect of the T10 vertebral body resulting in mild
canal narrowing.

## 2020-04-15 ENCOUNTER — Telehealth (INDEPENDENT_AMBULATORY_CARE_PROVIDER_SITE_OTHER): Payer: Medicare Other | Admitting: Cardiology

## 2020-04-15 ENCOUNTER — Encounter: Payer: Self-pay | Admitting: Cardiology

## 2020-04-15 VITALS — BP 131/60 | HR 70 | Ht 59.0 in | Wt 124.0 lb

## 2020-04-15 DIAGNOSIS — E119 Type 2 diabetes mellitus without complications: Secondary | ICD-10-CM

## 2020-04-15 DIAGNOSIS — I5032 Chronic diastolic (congestive) heart failure: Secondary | ICD-10-CM | POA: Diagnosis not present

## 2020-04-15 DIAGNOSIS — Z8673 Personal history of transient ischemic attack (TIA), and cerebral infarction without residual deficits: Secondary | ICD-10-CM

## 2020-04-15 DIAGNOSIS — R4701 Aphasia: Secondary | ICD-10-CM | POA: Diagnosis not present

## 2020-04-15 DIAGNOSIS — I4821 Permanent atrial fibrillation: Secondary | ICD-10-CM

## 2020-04-15 MED ORDER — HYDRALAZINE HCL 25 MG PO TABS: 25.0000 mg | ORAL_TABLET | Freq: Two times a day (BID) | ORAL | 1 refills | Status: DC

## 2020-04-15 NOTE — Patient Instructions (Signed)
Medication Instructions:   START Hydralazine 25 mg twice daily.  *If you need a refill on your cardiac medications before your next appointment, please call your pharmacy*   Follow-Up: At [facility], you and your health needs are our priority.  As part of our continuing mission to provide you with exceptional heart care, we have created designated Provider Care Teams.  These Care Teams include your primary Cardiologist (physician) and Advanced Practice Providers (APPs -  Physician Assistants and Nurse Practitioners) who all work together to provide you with the care you need, when you need it.  We recommend signing up for the patient portal called "MyChart".  Sign up information is provided on this After Visit Summary.  MyChart is used to connect with patients for Virtual Visits (Telemedicine).  Patients are able to view lab/test results, encounter notes, upcoming appointments, etc.  Non-urgent messages can be sent to your provider as well.   To learn more about what you can do with MyChart, go to [email or web address].    Your next appointment:   Tuesday, July 13th at 11:45 AM  The format for your next appointment:   Virtual Visit   Provider:   Corine Shelter, [place]   Other Instructions Your physician has requested that you regularly monitor and record your blood pressure readings at home 3 times per week. Please use the same machine at the same time of day to check your readings and record them to bring to your follow-up visit.

## 2020-04-15 NOTE — Progress Notes (Signed)
Virtual Visit via Telephone Note   This visit type was conducted due to national recommendations for restrictions regarding the COVID-19 Pandemic (e.g. social distancing) in an effort to limit this patient's exposure and mitigate transmission in our community.  Due to her co-morbid illnesses, this patient is at least at moderate risk for complications without adequate follow up.  This format is felt to be most appropriate for this patient at this time.  The patient did not have access to video technology/had technical difficulties with video requiring transitioning to audio format only (telephone).  All issues noted in this document were discussed and addressed.  No physical exam could be performed with this format.  Please refer to the patient's chart for her  consent to telehealth for [name].   The patient was identified using 2 identifiers.  Date:  04/15/2020   ID:  [name], DOB 29-Nov-1929, MRN [id number]  Patient Location: Home Provider Location: Home  PCP:  Tisovec, Fransico Him, MD  Cardiologist:  Skeet Latch, MD  Electrophysiologist:  None   Evaluation Performed:  Follow-Up Visit  Chief Complaint:  none  History of Present Illness:    [name] is a 86 y.o. female with history of chronic atrial fibrillation and prior stroke.  She apparently had a stroke while on therapeutic warfarin and was changed to Eliquis.  She has an expressive aphasia.  I spoke with her husband today on the phone.  She saw Dr. Oval Linsey in April 2021.  She had been having some lower extremity edema and her amlodipine was cut back to 5 mg daily.  She has chronic renal insufficiency stage III and has previously been intolerant to lisinopril and losartan.  When I spoke to her husband today he says she has been doing well, her edema has improved. When I when I asked him to review the patient's blood pressure turns out her blood pressures actually been running high, its  consistently in the 938B systolic range.   The patient does not have symptoms concerning for COVID-19 infection (fever, chills, cough, or new shortness of breath).    Past Medical History:  Diagnosis Date  . Atrial fibrillation ([place])   . Chronic anticoagulation   . Chronic anticoagulation   . CVA (cerebrovascular accident) ([place])   . Diabetes mellitus    pt & husband deny any knowledge of this   . History of chicken pox   . Hyperlipidemia   . Hypertension    Past Surgical History:  Procedure Laterality Date  . APPENDECTOMY    . BACK SURGERY    . CARDIAC CATHETERIZATION  11/09/91   EF 70%  . DILATION AND CURETTAGE OF UTERUS    . IR PERCUTANEOUS ART THROMBECTOMY/INFUSION INTRACRANIAL INC DIAG ANGIO  10/03/2018  . RADIOLOGY WITH ANESTHESIA N/A 10/03/2018   Procedure: IR WITH ANESTHESIA;  Surgeon: Radiologist, Medication, MD;  Location: [facility];  Service: Radiology;  Laterality: N/A;     Current Meds  Medication Sig  . acetaminophen (TYLENOL) 325 MG tablet Take 2 tablets (650 mg total) by mouth every 4 (four) hours as needed for mild pain (or temp > 37.5 C (99.5 F)).  Marland Kitchen amLODipine (NORVASC) 5 MG tablet Take 1 tablet (5 mg total) by mouth daily.  Marland Kitchen apixaban (ELIQUIS) 2.5 MG TABS tablet Take 1 tablet (2.5 mg total) by mouth 2 (two) times daily.  Marland Kitchen atorvastatin (LIPITOR) 20 MG tablet Take 1 tablet by mouth once daily  . levothyroxine (SYNTHROID, LEVOTHROID)  25 MCG tablet Take 1 tablet (25 mcg total) by mouth daily before breakfast.  . nadolol (CORGARD) 20 MG tablet Take 1/2 (one-half) tablet by mouth once daily  . nitroGLYCERIN (NITROSTAT) 0.4 MG SL tablet Place 0.4 mg under the tongue every 5 (five) minutes as needed (chest pain).   . pantoprazole (PROTONIX) 40 MG tablet Take 1 tablet (40 mg total) by mouth daily.     Allergies:   Cozaar, Hydralazine, Hyzaar [losartan potassium-hctz], Sulfa drugs cross reactors, and Lisinopril   Social History   Tobacco Use  . Smoking status:  Former Smoker    Quit date: 10/19/1998    Years since quitting: 21.5  . Smokeless tobacco: Never Used  Vaping Use  . Vaping Use: Never used  Substance Use Topics  . Alcohol use: No  . Drug use: No     Family Hx: The patient's family history includes Heart disease in her father and mother; Heart failure in her brother; Hypertension in her mother; Stroke in her mother. There is no history of Osteoporosis.  ROS:   Please see the history of present illness.    All other systems reviewed and are negative.   Prior CV studies:   The following studies were reviewed today:    Labs/Other Tests and Data Reviewed:    EKG:  An ECG dated 02/09/2020 was personally reviewed today and demonstrated:  AF with CVR- inferior Qs, LAD  Recent Labs: No results found for requested labs within last 8760 hours.   Recent Lipid Panel Lab Results  Component Value Date/Time   CHOL 166 03/01/2019 02:56 PM   TRIG 80 03/01/2019 02:56 PM   HDL 76 03/01/2019 02:56 PM   CHOLHDL 2.2 03/01/2019 02:56 PM   CHOLHDL 4.0 10/04/2018 05:10 AM   LDLCALC 74 03/01/2019 02:56 PM   LDLCALC 78 10/13/2017 04:58 PM    Wt Readings from Last 3 Encounters:  04/15/20 124 lb (56.2 kg)  02/09/20 127 lb 6.4 oz (57.8 kg)  07/11/19 115 lb (52.2 kg)     Objective:    Vital Signs:  BP 131/60 Comment: taken yesterday  Pulse 70 Comment: yesterday  Ht 4\' 11"  (1.499 m)   Wt 124 lb (56.2 kg)   BMI 25.04 kg/m    VITAL SIGNS:  reviewed  ASSESSMENT & PLAN:    HTN- Sub optimal control- add Hydralazine 25 mg BID to see if we can get her closer to 135/85.  CAF- On Eliquis-2.5mg  BID  H/O CVA- Expressive aphasia- I spoke with her husband on the phone today.  CRI- GFR 33- May 2020  DJD- Severe DJD with compression fractures  Plan: Add Hydralazine 25 mg BID. Virtual f/u in 2-3 weeks.  COVID-19 Education: The signs and symptoms of COVID-19 were discussed with the patient and how to seek care for testing (follow up with  PCP or arrange E-visit).  The importance of social distancing was discussed today.  Time:   Today, I have spent 15 minutes with the patient with telehealth technology discussing the above problems.     Medication Adjustments/Labs and Tests Ordered: Current medicines are reviewed at length with the patient today.  Concerns regarding medicines are outlined above.   Tests Ordered: No orders of the defined types were placed in this encounter.   Medication Changes: Meds ordered this encounter  Medications  . hydrALAZINE (APRESOLINE) 25 MG tablet    Sig: Take 1 tablet (25 mg total) by mouth in the morning and at bedtime.    Dispense:  180 tablet    Refill:  1    Follow Up:  Virtual Visit  with me 2-3 weeks.   Signed, Corine Shelter, PA-C  04/15/2020 1:30 PM    [facility]

## 2020-04-30 ENCOUNTER — Encounter: Payer: Self-pay | Admitting: Cardiology

## 2020-04-30 ENCOUNTER — Telehealth (INDEPENDENT_AMBULATORY_CARE_PROVIDER_SITE_OTHER): Payer: Medicare Other | Admitting: Cardiology

## 2020-04-30 VITALS — BP 148/71 | HR 52 | Ht 59.0 in | Wt 124.0 lb

## 2020-04-30 DIAGNOSIS — R4701 Aphasia: Secondary | ICD-10-CM | POA: Diagnosis not present

## 2020-04-30 DIAGNOSIS — N1831 Chronic kidney disease, stage 3a: Secondary | ICD-10-CM | POA: Diagnosis not present

## 2020-04-30 DIAGNOSIS — Z8673 Personal history of transient ischemic attack (TIA), and cerebral infarction without residual deficits: Secondary | ICD-10-CM

## 2020-04-30 DIAGNOSIS — I4819 Other persistent atrial fibrillation: Secondary | ICD-10-CM

## 2020-04-30 DIAGNOSIS — I10 Essential (primary) hypertension: Secondary | ICD-10-CM

## 2020-04-30 NOTE — Patient Instructions (Addendum)
Medication Instructions:  Your physician recommends that you continue on your current medications as directed. Please refer to the Current Medication list given to you today.  *If you need a refill on your cardiac medications before your next appointment, please call your pharmacy*  Lab Work: None ordered today  If you have labs (blood work) drawn today and your tests are completely normal, you will receive your results only by: Marland Kitchen MyChart Message (if you have MyChart) OR . A paper copy in the mail If you have any lab test that is abnormal or we need to change your treatment, we will call you to review the results.  Testing/Procedures: None ordered today  Follow-Up:  On 08/07/20 at 11:20 with Dr. Duke Salvia

## 2020-04-30 NOTE — Progress Notes (Signed)
Virtual Visit via Telephone Note   This visit type was conducted due to national recommendations for restrictions regarding the COVID-19 Pandemic (e.g. social distancing) in an effort to limit this patient's exposure and mitigate transmission in our community.  Due to her co-morbid illnesses, this patient is at least at moderate risk for complications without adequate follow up.  This format is felt to be most appropriate for this patient at this time.  The patient did not have access to video technology/had technical difficulties with video requiring transitioning to audio format only (telephone).  All issues noted in this document were discussed and addressed.  No physical exam could be performed with this format.  Please refer to the patient's chart for her  consent to telehealth for [facility].   The patient was identified using 2 identifiers.  Date:  04/30/2020   ID:  [name], DOB 1930-03-13, MRN [id number]  Patient Location: Home Provider Location: Home Office  PCP:  Tisovec, Adelfa Koh, MD  Cardiologist:  Chilton Si, MD  Electrophysiologist:  None   Evaluation Performed:  Follow-Up Visit  Chief Complaint:  none  History of Present Illness:    Genea Rheaume is a 86 y.o. female withhistory of chronic atrial fibrillation and prior stroke.  She apparently had a stroke while on therapeutic warfarin and was changed to Eliquis.  She has an expressive aphasia.  I spoke with her husband today on the phone.  She saw Dr. Duke Salvia in April 2021.  She had been having some lower extremity edema and her amlodipine was cut back to 5 mg daily.  She has chronic renal insufficiency stage III and has previously been intolerant to lisinopril and losartan.  When I spoke to her husband today he says she has been doing well, her edema has improved. But it appears her B/P has drifted up. When I when I asked him to review the patient's blood pressure turns out her blood  pressures actually been running high, its consistently in the 140s systolic range.   I suggested the patient start Hydralazine 25 mg BID to get her B/P closer to goal, especially with her history of CRI.   She was contacted today for follow up.  Her B/P today was 148/71.  The patient's husband reports he saw no significant change with the addition of Hydralazine, and in fact they think her B/P has been running higher since she started it.  They were reluctant to go up to 25 mg TID and expressed some frustration about different recommendations for her target B/P by different providers and the "what I've heard".   The patient does not have symptoms concerning for COVID-19 infection (fever, chills, cough, or new shortness of breath).    Past Medical History:  Diagnosis Date  . Atrial fibrillation (HCC)   . Chronic anticoagulation   . Chronic anticoagulation   . CVA (cerebrovascular accident) (HCC)   . Diabetes mellitus    pt & husband deny any knowledge of this   . History of chicken pox   . Hyperlipidemia   . Hypertension    Past Surgical History:  Procedure Laterality Date  . APPENDECTOMY    . BACK SURGERY    . CARDIAC CATHETERIZATION  11/09/91   EF 70%  . DILATION AND CURETTAGE OF UTERUS    . IR PERCUTANEOUS ART THROMBECTOMY/INFUSION INTRACRANIAL INC DIAG ANGIO  10/03/2018  . RADIOLOGY WITH ANESTHESIA N/A 10/03/2018   Procedure: IR WITH ANESTHESIA;  Surgeon: Radiologist,  Medication, MD;  Location: [facility] OR;  Service: Radiology;  Laterality: N/A;     Current Meds  Medication Sig  . acetaminophen (TYLENOL) 325 MG tablet Take 2 tablets (650 mg total) by mouth every 4 (four) hours as needed for mild pain (or temp > 37.5 C (99.5 F)).  Marland Kitchen amLODipine (NORVASC) 5 MG tablet Take 1 tablet (5 mg total) by mouth daily.  Marland Kitchen apixaban (ELIQUIS) 2.5 MG TABS tablet Take 1 tablet (2.5 mg total) by mouth 2 (two) times daily.  Marland Kitchen atorvastatin (LIPITOR) 20 MG tablet Take 1 tablet by mouth once daily  .  hydrALAZINE (APRESOLINE) 25 MG tablet Take 1 tablet (25 mg total) by mouth in the morning and at bedtime.  Marland Kitchen levothyroxine (SYNTHROID, LEVOTHROID) 25 MCG tablet Take 1 tablet (25 mcg total) by mouth daily before breakfast.  . nadolol (CORGARD) 20 MG tablet Take 1/2 (one-half) tablet by mouth once daily  . nitroGLYCERIN (NITROSTAT) 0.4 MG SL tablet Place 0.4 mg under the tongue every 5 (five) minutes as needed (chest pain).   . pantoprazole (PROTONIX) 40 MG tablet Take 1 tablet (40 mg total) by mouth daily.     Allergies:   Cozaar, Hydralazine, Hyzaar [losartan potassium-hctz], Sulfa drugs cross reactors, and Lisinopril   Social History   Tobacco Use  . Smoking status: Former Smoker    Quit date: 10/19/1998    Years since quitting: 21.5  . Smokeless tobacco: Never Used  Vaping Use  . Vaping Use: Never used  Substance Use Topics  . Alcohol use: No  . Drug use: No     Family Hx: The patient's family history includes Heart disease in her father and mother; Heart failure in her brother; Hypertension in her mother; Stroke in her mother. There is no history of Osteoporosis.  ROS:   Please see the history of present illness.    All other systems reviewed and are negative.   Prior CV studies:   The following studies were reviewed today:  Echo 10/05/2019- Study Conclusions   - Left ventricle: The cavity size was normal. There was mild  concentric hypertrophy. Systolic function was normal. The  estimated ejection fraction was in the range of 55% to 60%.  Akinesis of the basal and mid anteroseptal walls.  - Aortic valve: There was mild regurgitation.  - Mitral valve: There was mild regurgitation.  - Left atrium: The atrium was mildly dilated.  - Right ventricle: The cavity size was normal. Wall thickness was  normal. Systolic function was normal.  - Tricuspid valve: There was mild regurgitation.  - Pulmonic valve: There was no regurgitation.  - Pulmonary arteries: Systolic  pressure was within the normal  range.  - Inferior vena cava: The vessel was normal in size.  - Pericardium, extracardiac: There was no pericardial effusion.    Labs/Other Tests and Data Reviewed:    EKG:  An ECG dated 02/09/2020 was personally reviewed today and demonstrated:  NSR- HR 62, inferior Qs  Recent Labs: No results found for requested labs within last 8760 hours.   Recent Lipid Panel Lab Results  Component Value Date/Time   CHOL 166 03/01/2019 02:56 PM   TRIG 80 03/01/2019 02:56 PM   HDL 76 03/01/2019 02:56 PM   CHOLHDL 2.2 03/01/2019 02:56 PM   CHOLHDL 4.0 10/04/2018 05:10 AM   LDLCALC 74 03/01/2019 02:56 PM   LDLCALC 78 10/13/2017 04:58 PM    Wt Readings from Last 3 Encounters:  04/30/20 124 lb (56.2 kg)  04/15/20  124 lb (56.2 kg)  02/09/20 127 lb 6.4 oz (57.8 kg)     Objective:    Vital Signs:  BP (!) 148/71   Pulse (!) 52   Ht 4\' 11"  (1.499 m)   Wt 124 lb (56.2 kg)   BMI 25.04 kg/m    VITAL SIGNS:  reviewed  ASSESSMENT & PLAN:    HTN- I explained that I thought the patient's B/P had drifted up after her Amlodipine was reduced secondary to edema. I suggested they talk with Dr and get his input on the patient's target B/P.  I'll also arrange for a follow up with Dr Wylene Simmer in a few months.  I suggested they continue the Hydralazine 25 mg BID for now and continue to check the patient's B/P 3 times a week and record.   CAF- On Eliquis-2.5mg  BID  H/O CVA- Expressive aphasia- I spoke with her husband on the phone today.  CRI- GFR 33- May 2020  DJD- Severe DJD with compression fractures  COVID-19 Education: The signs and symptoms of COVID-19 were discussed with the patient and how to seek care for testing (follow up with PCP or arrange E-visit).  The importance of social distancing was discussed today.  Time:   Today, I have spent 15 minutes with the patient with telehealth technology discussing the above problems.     Medication  Adjustments/Labs and Tests Ordered: Current medicines are reviewed at length with the patient today.  Concerns regarding medicines are outlined above.   Tests Ordered: No orders of the defined types were placed in this encounter.   Medication Changes: No orders of the defined types were placed in this encounter.   Follow Up:  In Person Dr June 2020  Signed, Duke Salvia, PA-C  04/30/2020 11:59 AM    [facility]   Note:  The patient's daughter called our office after the appointment.  She tells me she is concerned about how much the patient and her husband understand our recommendations.  She is trying to help with they're care. I explained the visit and my thoughts and recommendations.  She was appreciative of the call and plans to attend the office visit with Dr 05/02/2020 in Sept.  08-07-1975 PA-C 04/30/2020 12:38 PM

## 2020-06-01 ENCOUNTER — Other Ambulatory Visit: Payer: Self-pay | Admitting: Cardiovascular Disease

## 2020-06-03 ENCOUNTER — Telehealth: Payer: Self-pay | Admitting: Cardiovascular Disease

## 2020-06-03 NOTE — Telephone Encounter (Signed)
Patient's husband is requesting to speak with Dr. Leonides Sake nurse in regards to apixaban (ELIQUIS) 2.5 MG TABS tablet medication. He states he will provide additional information when he speaks with the nurse.

## 2020-06-03 NOTE — Telephone Encounter (Signed)
Pt given Eliquis 2.5 mg #28 samples. Her husband says they are in th donut hole until 10/2020 and will need some added help but wants to speak with [facility].. I advised him that she is not in the office today but will send her a message.   Eliquis 2.5 mg #28 Lot [id number] Exp 04/2021.

## 2020-06-06 NOTE — Telephone Encounter (Signed)
Husband is following up.

## 2020-06-06 NOTE — Telephone Encounter (Signed)
Husband of the patient called back asking specifically for [facility].

## 2020-06-06 NOTE — Telephone Encounter (Signed)
Left message to call back  

## 2020-06-11 NOTE — Telephone Encounter (Signed)
Patient's husband is returning call. 

## 2020-06-11 NOTE — Telephone Encounter (Signed)
Spoke with husband and will mail patient assistance papers

## 2020-06-12 NOTE — Telephone Encounter (Signed)
Forms mailed to patient

## 2020-07-01 ENCOUNTER — Other Ambulatory Visit: Payer: Self-pay | Admitting: Cardiovascular Disease

## 2020-07-08 DIAGNOSIS — E039 Hypothyroidism, unspecified: Secondary | ICD-10-CM | POA: Diagnosis not present

## 2020-07-08 DIAGNOSIS — E559 Vitamin D deficiency, unspecified: Secondary | ICD-10-CM | POA: Diagnosis not present

## 2020-07-08 DIAGNOSIS — I10 Essential (primary) hypertension: Secondary | ICD-10-CM | POA: Diagnosis not present

## 2020-07-17 DIAGNOSIS — Z7901 Long term (current) use of anticoagulants: Secondary | ICD-10-CM | POA: Diagnosis not present

## 2020-07-17 DIAGNOSIS — Z23 Encounter for immunization: Secondary | ICD-10-CM | POA: Diagnosis not present

## 2020-07-17 DIAGNOSIS — R7301 Impaired fasting glucose: Secondary | ICD-10-CM | POA: Diagnosis not present

## 2020-07-17 DIAGNOSIS — I4821 Permanent atrial fibrillation: Secondary | ICD-10-CM | POA: Diagnosis not present

## 2020-07-17 DIAGNOSIS — M810 Age-related osteoporosis without current pathological fracture: Secondary | ICD-10-CM | POA: Diagnosis not present

## 2020-07-17 DIAGNOSIS — E039 Hypothyroidism, unspecified: Secondary | ICD-10-CM | POA: Diagnosis not present

## 2020-07-17 DIAGNOSIS — I10 Essential (primary) hypertension: Secondary | ICD-10-CM | POA: Diagnosis not present

## 2020-07-17 DIAGNOSIS — I059 Rheumatic mitral valve disease, unspecified: Secondary | ICD-10-CM | POA: Diagnosis not present

## 2020-07-17 DIAGNOSIS — D6869 Other thrombophilia: Secondary | ICD-10-CM | POA: Diagnosis not present

## 2020-07-17 DIAGNOSIS — E559 Vitamin D deficiency, unspecified: Secondary | ICD-10-CM | POA: Diagnosis not present

## 2020-07-17 DIAGNOSIS — R350 Frequency of micturition: Secondary | ICD-10-CM | POA: Diagnosis not present

## 2020-07-17 DIAGNOSIS — Z0000 Encounter for general adult medical examination without abnormal findings: Secondary | ICD-10-CM | POA: Diagnosis not present

## 2020-07-17 DIAGNOSIS — R82998 Other abnormal findings in urine: Secondary | ICD-10-CM | POA: Diagnosis not present

## 2020-07-17 DIAGNOSIS — E7800 Pure hypercholesterolemia, unspecified: Secondary | ICD-10-CM | POA: Diagnosis not present

## 2020-07-17 DIAGNOSIS — I5032 Chronic diastolic (congestive) heart failure: Secondary | ICD-10-CM | POA: Diagnosis not present

## 2020-08-07 ENCOUNTER — Ambulatory Visit: Payer: Medicare Other | Admitting: Cardiovascular Disease

## 2020-08-08 ENCOUNTER — Other Ambulatory Visit: Payer: Self-pay

## 2020-08-08 MED ORDER — APIXABAN 2.5 MG PO TABS: 2.5000 mg | ORAL_TABLET | Freq: Two times a day (BID) | ORAL | 1 refills | Status: DC

## 2020-08-09 ENCOUNTER — Other Ambulatory Visit: Payer: Self-pay

## 2020-08-09 MED ORDER — APIXABAN 2.5 MG PO TABS: 2.5000 mg | ORAL_TABLET | Freq: Two times a day (BID) | ORAL | 1 refills | Status: DC

## 2020-08-14 NOTE — Progress Notes (Signed)
Virtual Visit via Telephone Note   This visit type was conducted due to national recommendations for restrictions regarding the COVID-19 Pandemic (e.g. social distancing) in an effort to limit this patient's exposure and mitigate transmission in our community.  Due to her co-morbid illnesses, this patient is at least at moderate risk for complications without adequate follow up.  This format is felt to be most appropriate for this patient at this time.  The patient did not have access to video technology/had technical difficulties with video requiring transitioning to audio format only (telephone).  All issues noted in this document were discussed and addressed.  No physical exam could be performed with this format.  Please refer to the patient's chart for her  consent to telehealth for [facility]  Evaluation Performed:  Follow-up visit  This visit type was conducted due to national recommendations for restrictions regarding the COVID-19 Pandemic (e.g. social distancing).  This format is felt to be most appropriate for this patient at this time.  All issues noted in this document were discussed and addressed.  No physical exam was performed (except for noted visual exam findings with Video Visits).  Please refer to the patient's chart (MyChart message for video visits and phone note for telephone visits) for the patient's consent to telehealth for [facility]  Date:  08/15/2020   ID:  [name], DOB [birth year], MRN [id number]  Patient Location:  [address] [place] [address]   Provider location:     [facility] [address] Office [phone] Fax [phone]   PCP:  Gaspar Garbe, MD  Cardiologist:  Chilton Si, MD  Electrophysiologist:  None   Chief Complaint: Permanent atrial fibrillation/essential hypertension  History of Present Illness:    [name] is a 86 y.o.  female who presents via audio/video conferencing for a telehealth visit today.  Patient verified DOB and address.  She has a PMH of PAF, essential hypertension, and prior CVA.  She had her stroke while on therapeutic warfarin and was transitioned to Eliquis.  She has expressive aphasia.  During her last virtual visit her husband assisted with conversation on the phone.  She was seen by Dr. Duke Salvia 4/21.  During that time she was having some lower extremity edema and her amlodipine was reduced to 5 mg daily.  She has chronic renal insufficiency stage III.  She was previously intolerant of lisinopril and losartan.  She was last seen by Corine Shelter, PA-C virtually on 04/30/2020.  She was doing well at that time.  Her edema had improved.  Her blood pressure had migrated up.  Blood pressures were ranging in the 140s systolic.  She was started on hydralazine 25 mg twice daily.  She was then followed up with and her hydralazine was increased to 3 times daily.  She is seen virtually today in follow-up and states she feels well.  Due to her expressive aphasia her husband helped her with the call.  He has been monitoring her blood pressures and noted that she has continued to have blood pressures in the 150s-140s-high 130s.  She is limited in her physical activity due to her previous CVA.  She reports compliance  with her previous medications.  I will have her increase her physical activity as tolerated, and to bring the salty 6 diet sheet, increase amlodipine to 7.5 mg daily, and have her follow-up in 1 month for reevaluation.  She  denies chest pain, shortness of breath, lower extremity edema, fatigue, palpitations, melena, hematuria, hemoptysis, diaphoresis, weakness, presyncope, syncope, orthopnea, and PND.   The patient does not symptoms concerning for COVID-19 infection (fever, chills, cough, or new SHORTNESS OF BREATH).    Prior CV studies:   The following studies were reviewed today:  EKG 02/09/2020  Sinus rhythm with first-degree AV block left axis deviation inferior infarct undetermined age 53 bpm  Echocardiogram 10/04/2018 Study Conclusions   - Left ventricle: The cavity size was normal. There was mild  concentric hypertrophy. Systolic function was normal. The  estimated ejection fraction was in the range of 55% to 60%.  Akinesis of the basal and mid anteroseptal walls.  - Aortic valve: There was mild regurgitation.  - Mitral valve: There was mild regurgitation.  - Left atrium: The atrium was mildly dilated.  - Right ventricle: The cavity size was normal. Wall thickness was  normal. Systolic function was normal.  - Tricuspid valve: There was mild regurgitation.  - Pulmonic valve: There was no regurgitation.  - Pulmonary arteries: Systolic pressure was within the normal  range.  - Inferior vena cava: The vessel was normal in size.  - Pericardium, extracardiac: There was no pericardial effusion.  Past Medical History:  Diagnosis Date  . Atrial fibrillation (HCC)   . Chronic anticoagulation   . Chronic anticoagulation   . CVA (cerebrovascular accident) (HCC)   . Diabetes mellitus    pt & husband deny any knowledge of this   . History of chicken pox   . Hyperlipidemia   . Hypertension    Past Surgical History:  Procedure Laterality Date  . APPENDECTOMY    . BACK SURGERY    . CARDIAC CATHETERIZATION  11/09/91   EF 70%  . DILATION AND CURETTAGE OF UTERUS    . IR PERCUTANEOUS ART THROMBECTOMY/INFUSION INTRACRANIAL INC DIAG ANGIO  10/03/2018  . RADIOLOGY WITH ANESTHESIA N/A 10/03/2018   Procedure: IR WITH ANESTHESIA;  Surgeon: Radiologist, Medication, MD;  Location: [facility] OR;  Service: Radiology;  Laterality: N/A;     Current Meds  Medication Sig  . acetaminophen (TYLENOL) 325 MG tablet Take 2 tablets (650 mg total) by mouth every 4 (four) hours as needed for mild pain (or temp > 37.5 C (99.5 F)).  Marland Kitchen amLODipine (NORVASC) 5 MG tablet Take 1 tablet (5 mg total) by  mouth daily.  Marland Kitchen apixaban (ELIQUIS) 2.5 MG TABS tablet Take 1 tablet (2.5 mg total) by mouth 2 (two) times daily.  Marland Kitchen atorvastatin (LIPITOR) 20 MG tablet Take 1 tablet by mouth once daily  . levothyroxine (SYNTHROID, LEVOTHROID) 25 MCG tablet Take 1 tablet (25 mcg total) by mouth daily before breakfast.  . nadolol (CORGARD) 20 MG tablet Take 1/2 (one-half) tablet by mouth once daily  . nitroGLYCERIN (NITROSTAT) 0.4 MG SL tablet Place 0.4 mg under the tongue every 5 (five) minutes as needed (chest pain).      Allergies:   Cozaar, Hydralazine, Hyzaar [losartan potassium-hctz], Sulfa drugs cross reactors, and Lisinopril   Social History   Tobacco Use  . Smoking status: Former Smoker    Quit date: 10/19/1998    Years since quitting: 21.8  . Smokeless tobacco: Never Used  Vaping Use  . Vaping Use: Never used  Substance Use Topics  . Alcohol use: No  . Drug use: No     Family Hx: The patient's family history includes Heart disease in her father and mother; Heart failure in her brother; Hypertension in her mother;  Stroke in her mother. There is no history of Osteoporosis.  ROS:   Please see the history of present illness.     All other systems reviewed and are negative.   Labs/Other Tests and Data Reviewed:    Recent Labs: No results found for requested labs within last 8760 hours.   Recent Lipid Panel Lab Results  Component Value Date/Time   CHOL 166 03/01/2019 02:56 PM   TRIG 80 03/01/2019 02:56 PM   HDL 76 03/01/2019 02:56 PM   CHOLHDL 2.2 03/01/2019 02:56 PM   CHOLHDL 4.0 10/04/2018 05:10 AM   LDLCALC 74 03/01/2019 02:56 PM   LDLCALC 78 10/13/2017 04:58 PM    Wt Readings from Last 3 Encounters:  08/15/20 126 lb (57.2 kg)  04/30/20 124 lb (56.2 kg)  04/15/20 124 lb (56.2 kg)     None exam:    Vital Signs:  BP 136/64   Pulse (!) 55   Ht 4\' 11"  (1.499 m)   Wt 126 lb (57.2 kg)   BMI 25.45 kg/m    Well nourished, well developed female in no  acute distress.    ASSESSMENT & PLAN:    1.  Essential hypertension-BP today 136/64 .  Continues to be somewhat labile at home ranging in the 150s-140s-high 130s. Continue hydralazine, nadolol Heart healthy low-sodium diet-salty 6 given Increase physical activity as tolerated Increase amlodipine to 7.5 mg daily  Permanent atrial fibrillation-heart rate today 55 no recent episodes of accelerated heart rate or irregular beats. Continue nadolol, Eliquis Heart healthy low-sodium diet-salty 6 given Increase physical activity as tolerated  History of CVA-patient with expressive aphasia.  Husband helps with virtual visit conversation via phone.  Disposition: Follow-up with Dr. or Duke Salvia, PA-C in 3 months.  COVID-19 Education: The signs and symptoms of COVID-19 were discussed with the patient and how to seek care for testing (follow up with PCP or arrange E-visit).  The importance of social distancing was discussed today.  Patient Risk:   After full review of this patients clinical status, I feel that they are at least moderate risk at this time.  Time:   Today, I have spent 10 minutes with the patient with telehealth technology discussing blood pressure, medication, diet.  I spent greater than 20 minutes reviewing patient's past medical history, previous notes, and cardiac tests.   Medication Adjustments/Labs and Tests Ordered: Current medicines are reviewed at length with the patient today.  Concerns regarding medicines are outlined above.   Tests Ordered: No orders of the defined types were placed in this encounter.  Medication Changes: No orders of the defined types were placed in this encounter.   Disposition:  in 1 month(s)  Signed, Corine Shelter. Akai Dollard NP-C    05/23/2019 11:58 AM    [facility] [address] Office [phone] Fax [phone]

## 2020-08-15 ENCOUNTER — Encounter: Payer: Self-pay | Admitting: General Practice

## 2020-08-15 ENCOUNTER — Telehealth (INDEPENDENT_AMBULATORY_CARE_PROVIDER_SITE_OTHER): Payer: Medicare Other | Admitting: General Practice

## 2020-08-15 VITALS — BP 136/64 | HR 55 | Ht 59.0 in | Wt 126.0 lb

## 2020-08-15 DIAGNOSIS — I10 Essential (primary) hypertension: Secondary | ICD-10-CM

## 2020-08-15 DIAGNOSIS — Z8673 Personal history of transient ischemic attack (TIA), and cerebral infarction without residual deficits: Secondary | ICD-10-CM

## 2020-08-15 DIAGNOSIS — I4821 Permanent atrial fibrillation: Secondary | ICD-10-CM

## 2020-08-15 MED ORDER — AMLODIPINE BESYLATE 5 MG PO TABS: 7.5000 mg | ORAL_TABLET | Freq: Every day | ORAL | 3 refills | Status: DC

## 2020-08-15 NOTE — Patient Instructions (Signed)
Medication Instructions:  INCREASE AMLODIPINE 7.5MG  (1-1/2 TABLETS) DAILY *If you need a refill on your cardiac medications before your next appointment, please call your pharmacy*  Lab Work:   Testing/Procedures:  NONE    NONE  Special Instructions TAKE AND LOG YOUR BLOOD PRESSURE DAILY AND HAVE AVAILABLE FOR YOU FOLLOW UP APPOINTMENT IN 1 MONTH. MAKE SURE THAT YOU TAKE YOU BLOOD PRESSURE AT LEASE 1 HOUR AFTER TAKING YOUR MEDICATION.  PLEASE READ AND FOLLOW SALTY 6-ATTACHED  PLEASE INCREASE PHYSICAL ACTIVITY AS TOLERATED  Follow-Up: Your next appointment:  1 month(s) Virtual Visit  with Corine Shelter, PA-C  09-17-2020 @ 145PM  At [facility], you and your health needs are our priority.  As part of our continuing mission to provide you with exceptional heart care, we have created designated Provider Care Teams.  These Care Teams include your primary Cardiologist (physician) and Advanced Practice Providers (APPs -  Physician Assistants and Nurse Practitioners) who all work together to provide you with the care you need, when you need it.

## 2020-09-17 ENCOUNTER — Encounter: Payer: Self-pay | Admitting: Cardiology

## 2020-09-17 ENCOUNTER — Telehealth (INDEPENDENT_AMBULATORY_CARE_PROVIDER_SITE_OTHER): Payer: Medicare Other | Admitting: Cardiology

## 2020-09-17 VITALS — BP 130/50 | Ht 59.0 in | Wt 125.0 lb

## 2020-09-17 DIAGNOSIS — I4819 Other persistent atrial fibrillation: Secondary | ICD-10-CM

## 2020-09-17 DIAGNOSIS — I69320 Aphasia following cerebral infarction: Secondary | ICD-10-CM | POA: Diagnosis not present

## 2020-09-17 DIAGNOSIS — Z8673 Personal history of transient ischemic attack (TIA), and cerebral infarction without residual deficits: Secondary | ICD-10-CM | POA: Diagnosis not present

## 2020-09-17 DIAGNOSIS — N1832 Chronic kidney disease, stage 3b: Secondary | ICD-10-CM | POA: Diagnosis not present

## 2020-09-17 NOTE — Progress Notes (Signed)
Virtual Visit via Telephone Note   This visit type was conducted due to national recommendations for restrictions regarding the COVID-19 Pandemic (e.g. social distancing) in an effort to limit this patient's exposure and mitigate transmission in our community.  Due to her co-morbid illnesses, this patient is at least at moderate risk for complications without adequate follow up.  This format is felt to be most appropriate for this patient at this time.  The patient did not have access to video technology/had technical difficulties with video requiring transitioning to audio format only (telephone).  All issues noted in this document were discussed and addressed.  No physical exam could be performed with this format.  Please refer to the patient's chart for her  consent to telehealth for [facility].    Date:  09/17/2020   ID:  [name], DOB Nov 12, 1929, MRN [id number] The patient was identified using 2 identifiers.  Patient Location: Home Provider Location: Home Office  PCP:  Tisovec, Adelfa Koh, MD  Cardiologist:  Chilton Si, MD  Electrophysiologist:  None   Evaluation Performed:  Follow-Up Visit  Chief Complaint:  none  History of Present Illness:    [name] is a 86 y.o. female with a history of PAF and prior stroke.  She apparently had a stroke while on therapeutic warfarin and was changed to Eliquis.  She has an expressive aphasia.  I spoke with her husband today on the phone for routine follow up.  In the Spring her Amlodipine was cut back to 5 mg daily secondary to LE edema.  In October her Amlodipine was increased to 7.5mg  daily.  I reviewed her medications in detail with her husband.  He reports her B/P at home is usually in the 130's systolic.  He tells me his home cuff seems to be reading higher than B/P at the office.  The patient has no symptoms of SOB or chest pain.   The patient does not have symptoms concerning for COVID-19 infection  (fever, chills, cough, or new shortness of breath).    Past Medical History:  Diagnosis Date  . Atrial fibrillation (HCC)   . Chronic anticoagulation   . Chronic anticoagulation   . CVA (cerebrovascular accident) (HCC)   . Diabetes mellitus    pt & husband deny any knowledge of this   . History of chicken pox   . Hyperlipidemia   . Hypertension    Past Surgical History:  Procedure Laterality Date  . APPENDECTOMY    . BACK SURGERY    . CARDIAC CATHETERIZATION  11/09/91   EF 70%  . DILATION AND CURETTAGE OF UTERUS    . IR PERCUTANEOUS ART THROMBECTOMY/INFUSION INTRACRANIAL INC DIAG ANGIO  10/03/2018  . RADIOLOGY WITH ANESTHESIA N/A 10/03/2018   Procedure: IR WITH ANESTHESIA;  Surgeon: Radiologist, Medication, MD;  Location: [facility] OR;  Service: Radiology;  Laterality: N/A;     Current Meds  Medication Sig  . amLODipine (NORVASC) 5 MG tablet Take 1.5 tablets (7.5 mg total) by mouth daily.  Marland Kitchen apixaban (ELIQUIS) 2.5 MG TABS tablet Take 1 tablet (2.5 mg total) by mouth 2 (two) times daily.  Marland Kitchen atorvastatin (LIPITOR) 20 MG tablet Take 1 tablet by mouth once daily  . hydrALAZINE (APRESOLINE) 25 MG tablet Take 1 tablet (25 mg total) by mouth in the morning and at bedtime.  Marland Kitchen levothyroxine (SYNTHROID, LEVOTHROID) 25 MCG tablet Take 1 tablet (25 mcg total) by mouth daily before breakfast.  . nadolol (CORGARD) 20  MG tablet Take 1/2 (one-half) tablet by mouth once daily  . nitroGLYCERIN (NITROSTAT) 0.4 MG SL tablet Place 0.4 mg under the tongue every 5 (five) minutes as needed (chest pain).   . pantoprazole (PROTONIX) 40 MG tablet Take 1 tablet (40 mg total) by mouth daily.     Allergies:   Cozaar, Hydralazine, Hyzaar [losartan potassium-hctz], Sulfa drugs cross reactors, and Lisinopril   Social History   Tobacco Use  . Smoking status: Former Smoker    Quit date: 10/19/1998    Years since quitting: 21.9  . Smokeless tobacco: Never Used  Vaping Use  . Vaping Use: Never used  Substance Use  Topics  . Alcohol use: No  . Drug use: No     Family Hx: The patient's family history includes Heart disease in her father and mother; Heart failure in her brother; Hypertension in her mother; Stroke in her mother. There is no history of Osteoporosis.  ROS:   Please see the history of present illness.    All other systems reviewed and are negative.   Prior CV studies:   The following studies were reviewed today:  Echo Dec 2019- Study Conclusions   - Left ventricle: The cavity size was normal. There was mild  concentric hypertrophy. Systolic function was normal. The  estimated ejection fraction was in the range of 55% to 60%.  Akinesis of the basal and mid anteroseptal walls.  - Aortic valve: There was mild regurgitation.  - Mitral valve: There was mild regurgitation.  - Left atrium: The atrium was mildly dilated.  - Right ventricle: The cavity size was normal. Wall thickness was  normal. Systolic function was normal.  - Tricuspid valve: There was mild regurgitation.  - Pulmonic valve: There was no regurgitation.  - Pulmonary arteries: Systolic pressure was within the normal  range.  - Inferior vena cava: The vessel was normal in size.  - Pericardium, extracardiac: There was no pericardial effusion.   MRI Dec 2019- IMPRESSION: Acute infarct left MCA territory. Largest area infarction is in the left parietal lobe with extension into the insula and left frontal lobe. No associated hemorrhage  Atrophy and chronic ischemic changes as above.  Labs/Other Tests and Data Reviewed:    EKG:  An ECG dated 02/09/2020 was personally reviewed today and demonstrated:  NSR-HR 62, 1st AVB, inferior Qs  Recent Labs: No results found for requested labs within last 8760 hours.   Recent Lipid Panel Lab Results  Component Value Date/Time   CHOL 166 03/01/2019 02:56 PM   TRIG 80 03/01/2019 02:56 PM   HDL 76 03/01/2019 02:56 PM   CHOLHDL 2.2 03/01/2019 02:56 PM   CHOLHDL 4.0  10/04/2018 05:10 AM   LDLCALC 74 03/01/2019 02:56 PM   LDLCALC 78 10/13/2017 04:58 PM    Wt Readings from Last 3 Encounters:  09/17/20 125 lb (56.7 kg)  08/15/20 126 lb (57.2 kg)  04/30/20 124 lb (56.2 kg)     Risk Assessment/Calculations:     CHA2DS2-VASc Score =   6 This indicates a  % annual risk of stroke. The patient's score is based upon:  Age, sex, HTN, prior stroke    Objective:    Vital Signs:  BP (!) 130/50   Ht 4\' 11"  (1.499 m)   Wt 125 lb (56.7 kg)   BMI 25.25 kg/m    VITAL SIGNS:  reviewed  ASSESSMENT & PLAN:    HTN- Adequate control- no change in current Rx.  He will bring his home  cuff in at next OV.    CAF- On Eliquis-2.5mg  BID  H/O CVA- Dec 2019. Expressive aphasia- I spoke with her husband on the phone today.  CRI- GFR 33- May 2020  DJD- Severe DJD with compression fractures  Plan-No change in Rx- f/u in the office in 6 months with Dr Duke Salvia.    Shared Decision Making/Informed Consent        COVID-19 Education: The signs and symptoms of COVID-19 were discussed with the patient and how to seek care for testing (follow up with PCP or arrange E-visit).  The importance of social distancing was discussed today.  Time:   Today, I have spent 15 minutes with the patient with telehealth technology discussing the above problems.     Medication Adjustments/Labs and Tests Ordered: Current medicines are reviewed at length with the patient today.  Concerns regarding medicines are outlined above.   Tests Ordered: No orders of the defined types were placed in this encounter.   Medication Changes: No orders of the defined types were placed in this encounter.   Follow Up:  In Person Dr Duke Salvia  Signed, Corine Shelter, PA-C  09/17/2020 1:49 PM    [facility]

## 2020-09-17 NOTE — Patient Instructions (Signed)
Medication Instructions:  °Continue current medications ° °*If you need a refill on your cardiac medications before your next appointment, please call your pharmacy* ° ° °Lab Work: °None Ordered ° ° °Testing/Procedures: °None Ordered ° ° °Follow-Up: °At [facility], you and your health needs are our priority.  As part of our continuing mission to provide you with exceptional heart care, we have created designated Provider Care Teams.  These Care Teams include your primary Cardiologist (physician) and Advanced Practice Providers (APPs -  Physician Assistants and Nurse Practitioners) who all work together to provide you with the care you need, when you need it. ° °We recommend signing up for the patient portal called "MyChart".  Sign up information is provided on this After Visit Summary.  MyChart is used to connect with patients for Virtual Visits (Telemedicine).  Patients are able to view lab/test results, encounter notes, upcoming appointments, etc.  Non-urgent messages can be sent to your provider as well.   °To learn more about what you can do with MyChart, go to [email or web address].   ° °Your next appointment:   °6 month(s) ° °The format for your next appointment:   °In Person ° °Provider:   °You may see Tiffany Hoyleton, MD or one of the following Advanced Practice Providers on your designated Care Team:   °· Luke Kilroy, PA-C °· Callie Goodrich, PA-C °· Jesse Cleaver, FNP ° ° ° ° °

## 2020-09-29 ENCOUNTER — Other Ambulatory Visit: Payer: Self-pay | Admitting: Cardiology

## 2020-10-04 DIAGNOSIS — Z23 Encounter for immunization: Secondary | ICD-10-CM | POA: Diagnosis not present

## 2020-12-18 ENCOUNTER — Other Ambulatory Visit: Payer: Self-pay | Admitting: Cardiology

## 2021-01-08 ENCOUNTER — Other Ambulatory Visit: Payer: Self-pay | Admitting: Cardiovascular Disease

## 2021-01-15 DIAGNOSIS — R7301 Impaired fasting glucose: Secondary | ICD-10-CM | POA: Diagnosis not present

## 2021-01-15 DIAGNOSIS — E559 Vitamin D deficiency, unspecified: Secondary | ICD-10-CM | POA: Diagnosis not present

## 2021-01-15 DIAGNOSIS — R350 Frequency of micturition: Secondary | ICD-10-CM | POA: Diagnosis not present

## 2021-01-15 DIAGNOSIS — I679 Cerebrovascular disease, unspecified: Secondary | ICD-10-CM | POA: Diagnosis not present

## 2021-01-15 DIAGNOSIS — I10 Essential (primary) hypertension: Secondary | ICD-10-CM | POA: Diagnosis not present

## 2021-01-15 DIAGNOSIS — Z7901 Long term (current) use of anticoagulants: Secondary | ICD-10-CM | POA: Diagnosis not present

## 2021-01-15 DIAGNOSIS — I059 Rheumatic mitral valve disease, unspecified: Secondary | ICD-10-CM | POA: Diagnosis not present

## 2021-01-15 DIAGNOSIS — I5032 Chronic diastolic (congestive) heart failure: Secondary | ICD-10-CM | POA: Diagnosis not present

## 2021-01-15 DIAGNOSIS — E7800 Pure hypercholesterolemia, unspecified: Secondary | ICD-10-CM | POA: Diagnosis not present

## 2021-01-15 DIAGNOSIS — I4821 Permanent atrial fibrillation: Secondary | ICD-10-CM | POA: Diagnosis not present

## 2021-01-15 DIAGNOSIS — I69998 Other sequelae following unspecified cerebrovascular disease: Secondary | ICD-10-CM | POA: Diagnosis not present

## 2021-01-15 DIAGNOSIS — E039 Hypothyroidism, unspecified: Secondary | ICD-10-CM | POA: Diagnosis not present

## 2021-02-12 ENCOUNTER — Other Ambulatory Visit: Payer: Self-pay | Admitting: Cardiovascular Disease

## 2021-03-13 ENCOUNTER — Other Ambulatory Visit: Payer: Self-pay | Admitting: Cardiovascular Disease

## 2021-03-19 ENCOUNTER — Other Ambulatory Visit: Payer: Self-pay | Admitting: Cardiovascular Disease

## 2021-03-19 ENCOUNTER — Telehealth: Payer: Self-pay | Admitting: Cardiovascular Disease

## 2021-03-19 MED ORDER — APIXABAN 2.5 MG PO TABS: 2.5000 mg | ORAL_TABLET | Freq: Two times a day (BID) | ORAL | 1 refills | Status: DC

## 2021-03-19 MED ORDER — AMLODIPINE BESYLATE 5 MG PO TABS: ORAL_TABLET | ORAL | 1 refills | Status: DC

## 2021-03-19 NOTE — Telephone Encounter (Signed)
40f, 57.8kg, Creatinine, Serum 1.600 mg/ 01/03/2020(OVERDUE), LOVW/KILROY 09/07/20 PRIMARY CARDIOLOGIST IS DR Boaz

## 2021-03-19 NOTE — Telephone Encounter (Signed)
Refill sent to the pharmacy electronically for amlodipine

## 2021-03-19 NOTE — Telephone Encounter (Signed)
*  STAT* If patient is at the pharmacy, call can be transferred to refill team.   1. Which medications need to be refilled? (please list name of each medication and dose if known) amLODipine (NORVASC) 5 MG tablet; apixaban (ELIQUIS) 2.5 MG TABS tablet    2. Which pharmacy/location (including street and city if local pharmacy) is medication to be sent to?  [facility] [address] [place] - [address]  3. Do they need a 30 day or 90 day supply? 90

## 2021-05-01 ENCOUNTER — Ambulatory Visit (HOSPITAL_BASED_OUTPATIENT_CLINIC_OR_DEPARTMENT_OTHER): Payer: Medicare Other | Admitting: Cardiovascular Disease

## 2021-05-21 ENCOUNTER — Other Ambulatory Visit: Payer: Self-pay | Admitting: Cardiovascular Disease

## 2021-05-25 NOTE — Telephone Encounter (Signed)
error 

## 2021-06-20 ENCOUNTER — Ambulatory Visit (HOSPITAL_BASED_OUTPATIENT_CLINIC_OR_DEPARTMENT_OTHER): Payer: Medicare Other | Admitting: Cardiovascular Disease

## 2021-07-14 ENCOUNTER — Other Ambulatory Visit: Payer: Self-pay

## 2021-07-14 ENCOUNTER — Ambulatory Visit (INDEPENDENT_AMBULATORY_CARE_PROVIDER_SITE_OTHER): Payer: Medicare Other | Admitting: Cardiovascular Disease

## 2021-07-14 ENCOUNTER — Encounter (HOSPITAL_BASED_OUTPATIENT_CLINIC_OR_DEPARTMENT_OTHER): Payer: Self-pay | Admitting: Cardiovascular Disease

## 2021-07-14 VITALS — BP 108/72 | HR 75 | Ht 59.0 in | Wt 129.4 lb

## 2021-07-14 DIAGNOSIS — E785 Hyperlipidemia, unspecified: Secondary | ICD-10-CM | POA: Diagnosis not present

## 2021-07-14 DIAGNOSIS — I10 Essential (primary) hypertension: Secondary | ICD-10-CM | POA: Diagnosis not present

## 2021-07-14 DIAGNOSIS — I4819 Other persistent atrial fibrillation: Secondary | ICD-10-CM

## 2021-07-14 DIAGNOSIS — I5032 Chronic diastolic (congestive) heart failure: Secondary | ICD-10-CM | POA: Diagnosis not present

## 2021-07-14 DIAGNOSIS — Z8673 Personal history of transient ischemic attack (TIA), and cerebral infarction without residual deficits: Secondary | ICD-10-CM | POA: Diagnosis not present

## 2021-07-14 NOTE — Progress Notes (Signed)
Cardiology Office Note   Evaluation Performed:  Follow-up visit  Date:  07/14/2021   ID:  [name], DOB 1929-12-16, MRN [id number]  PCP:  Gaspar Garbe, MD  Cardiologist:  Chilton Si, MD  Electrophysiologist:  None   Chief Complaint:  Follow up, hypertension  History of Present Illness:    [name] is an 87 y.o. female with chronic diastolic heart failure, persistent atrial fibrillation, stroke on warfarin, expressive aphasia, moderate L subclavian stenosis, hyperlipidemia and hypothyroidism who presents for follow up.  She was previously a patient of Dr. Patty Sermons.  She had a [employer] 08/2015. She was noted to be in atrial fibrillation, but there is no evidence of ischemia.  Echocardiogram 01/2012 revealed LVEF 50-55%.  [name] had a thoracic  vertebral compression fracture and had to undergo kyphoplasty.   Warfarin was held and she was bridged with Lovenox.  This was complicated by a paraspinal hematoma.  She suffered from another vertebral fracture and struggles with pain in her back.  She is not a candidate for any further interventions.  She has been working with pain management and takes oxycodone at night to sleep. She had also broken her left wrist right shoulder and just finished physical therapy.    [name] was admitted 09/2018 with a L MCA stroke.  INR was therapeutic on admission.  IR attempted revascularization unsuccessfully.  Echo that admission revealed LVEF 55 to 60% with akinesis of the basal and mid anteroseptal myocardium.  Warfarin was replaced with Eliquis.    She has residual expressive aphasia.   Her blood pressure was poorly controlled so amlodipine was increased.   At her last appointment she struggled with expressive aphasia. Amlodipine was reduced due to LE edema. It was later increased back to 7.5 mg due to poorly-controlled blood pressure. She followed up with Corine Shelter, PA-C 08/2020 and was  stable.  Today, she is accompanied by her husband, who is also a patient of mine. They both have appointments today and wish to be seen together. She is also accompanied by another family member. Overall she is doing pretty good, with no new complaints. She continues to struggle with expressive aphasia, and her hearing loss is worsening. Also, her bilateral LE edema is still present. However, since her last visit her breathing has improved with no recurrent episodes of chest pain. Generally she is able to get up and down to use the restroom. She denies any falls. She denies any palpitations. No lightheadedness, headaches, syncope, orthopnea, or PND.   Past Medical History:  Diagnosis Date   Atrial fibrillation (HCC)    Chronic anticoagulation    Chronic anticoagulation    CVA (cerebrovascular accident) (HCC)    Diabetes mellitus    pt & husband deny any knowledge of this    History of chicken pox    Hyperlipidemia    Hypertension    Past Surgical History:  Procedure Laterality Date   APPENDECTOMY     BACK SURGERY     CARDIAC CATHETERIZATION  11/09/91   EF 70%   DILATION AND CURETTAGE OF UTERUS     IR PERCUTANEOUS ART THROMBECTOMY/INFUSION INTRACRANIAL INC DIAG ANGIO  10/03/2018   RADIOLOGY WITH ANESTHESIA N/A 10/03/2018   Procedure: IR WITH ANESTHESIA;  Surgeon: Radiologist, Medication, MD;  Location: [facility] OR;  Service: Radiology;  Laterality: N/A;     Current Meds  Medication Sig   acetaminophen (TYLENOL) 325 MG tablet Take 2 tablets (  650 mg total) by mouth every 4 (four) hours as needed for mild pain (or temp > 37.5 C (99.5 F)).   amLODipine (NORVASC) 5 MG tablet TAKE 1 & 1/2 (ONE & ONE-HALF) TABLETS BY MOUTH ONCE DAILY   apixaban (ELIQUIS) 2.5 MG TABS tablet Take 1 tablet (2.5 mg total) by mouth 2 (two) times daily.   atorvastatin (LIPITOR) 20 MG tablet Take 1 tablet by mouth once daily   hydrALAZINE (APRESOLINE) 25 MG tablet TAKE 1 TABLET BY MOUTH IN THE MORNING AND AT BEDTIME    levothyroxine (SYNTHROID, LEVOTHROID) 25 MCG tablet Take 1 tablet (25 mcg total) by mouth daily before breakfast.   nadolol (CORGARD) 20 MG tablet Take 1/2 (one-half) tablet by mouth once daily   nitroGLYCERIN (NITROSTAT) 0.4 MG SL tablet Place 0.4 mg under the tongue every 5 (five) minutes as needed (chest pain).    pantoprazole (PROTONIX) 40 MG tablet Take 1 tablet (40 mg total) by mouth daily.     Allergies:   Cozaar, Hydralazine, Hyzaar [losartan potassium-hctz], Sulfa drugs cross reactors, and Lisinopril   Social History   Tobacco Use   Smoking status: Former    Types: Cigarettes    Quit date: 10/19/1998    Years since quitting: 22.7   Smokeless tobacco: Never  Vaping Use   Vaping Use: Never used  Substance Use Topics   Alcohol use: No   Drug use: No     Family Hx: The patient's family history includes Heart disease in her father and mother; Heart failure in her brother; Hypertension in her mother; Stroke in her mother. There is no history of Osteoporosis.  ROS:   Please see the history of present illness.    (+) Expressive aphasia (+) Bilateral LE edema (+) Hearing loss All other systems reviewed and are negative.   Prior CV studies:   The following studies were reviewed today:  Echo 10/04/18: Study Conclusions   - Left ventricle: The cavity size was normal. There was mild   concentric hypertrophy. Systolic function was normal. The   estimated ejection fraction was in the range of 55% to 60%.   Akinesis of the basal and mid anteroseptal walls. - Aortic valve: There was mild regurgitation. - Mitral valve: There was mild regurgitation. - Left atrium: The atrium was mildly dilated. - Right ventricle: The cavity size was normal. Wall thickness was   normal. Systolic function was normal. - Tricuspid valve: There was mild regurgitation. - Pulmonic valve: There was no regurgitation. - Pulmonary arteries: Systolic pressure was within the normal   range. - Inferior  vena cava: The vessel was normal in size. - Pericardium, extracardiac: There was no pericardial effusion.    Labs/Other Tests and Data Reviewed:    EKG:   07/14/2021: Atrial fibrillation. Rate 75 bpm. Prior inferior infarct. Prior anteroseptal infarct. 02/09/20: Sinus rhythm.  Rate 62 bpm.  First-degree AV block.  Left axis deviation.  Prior inferior infarct.  Recent Labs: No results found for requested labs within last 8760 hours.   Recent Lipid Panel Lab Results  Component Value Date/Time   CHOL 166 03/01/2019 02:56 PM   TRIG 80 03/01/2019 02:56 PM   HDL 76 03/01/2019 02:56 PM   CHOLHDL 2.2 03/01/2019 02:56 PM   CHOLHDL 4.0 10/04/2018 05:10 AM   LDLCALC 74 03/01/2019 02:56 PM   LDLCALC 78 10/13/2017 04:58 PM    Wt Readings from Last 3 Encounters:  07/14/21 129 lb 6.4 oz (58.7 kg)  09/17/20 125 lb (56.7 kg)  08/15/20 126 lb (57.2 kg)     Objective:    VS:  BP 108/72   Pulse 75   Ht 4\' 11"  (1.499 m)   Wt 129 lb 6.4 oz (58.7 kg)   BMI 26.14 kg/m  , BMI Body mass index is 26.14 kg/m. GENERAL:  Well appearing HEENT: Pupils equal round and reactive, fundi not visualized, oral mucosa unremarkable NECK:  No jugular venous distention, waveform within normal limits, carotid upstroke brisk and symmetric, no bruits LUNGS:  Clear to auscultation bilaterally HEART:  Irregularly irregular.  PMI not displaced or sustained,S1 and S2 within normal limits, no S3, no S4, no clicks, no rubs, no murmurs ABD:  Flat, positive bowel sounds normal in frequency in pitch, no bruits, no rebound, no guarding, no midline pulsatile mass, no hepatomegaly, no splenomegaly EXT:  2 plus pulses throughout, 1+ R  LE edema, no cyanosis no clubbing SKIN:  No rashes no nodules NEURO:  Expressive aphasia PSYCH:  Cognitively intact, oriented to person place and time   ASSESSMENT & PLAN:   Chronic diastolic heart failure (HCC) She is euvolemic and doing well.  Her amlodipine was previously reduced but  there was no improvement in her swelling.  She continues to have mild right lower extremity swelling that improves with wearing her compression sock.  It was recommended that she continue to do this more regularly and elevate her legs when sitting.  Continue amlodipine, nadolol, and hydralazine.  History of CVA (cerebrovascular accident) She continues to struggle with expressive aphasia.  Continue Eliquis.  Her previous stroke occurred when therapeutic on warfarin.  Dyslipidemia She is due for fasting lipids but is scheduled to see her PCP next month.  Continue atorvastatin.  LDL goal is less than 70.  HTN (hypertension) Blood pressure well-controlled on amlodipine, hydralazine, and nadolol.   Medication Adjustments/Labs and Tests Ordered: Current medicines are reviewed at length with the patient today.  Concerns regarding medicines are outlined above.   Tests Ordered: Orders Placed This Encounter  Procedures   EKG 12-Lead     Medication Changes: No orders of the defined types were placed in this encounter.   Disposition:   Follow up with APP in 6 months.   Follow up with Demarques Pilz C. , MD, [facility] in 1 year.   I,Mathew Stumpf,acting as a [facility] for Neurosurgeon, MD.,have documented all relevant documentation on the behalf of Chilton Si, MD,as directed by  Chilton Si, MD while in the presence of Chilton Si, MD.  I, Chitara Clonch C. Chilton Si, MD have reviewed all documentation for this visit.  The documentation of the exam, diagnosis, procedures, and orders on 07/14/2021 are all accurate and complete.   Signed, 07/16/2021, MD  07/14/2021 12:01 PM    [facility]

## 2021-07-14 NOTE — Patient Instructions (Signed)
Medication Instructions:  Your physician recommends that you continue on your current medications as directed. Please refer to the Current Medication list given to you today.   *If you need a refill on your cardiac medications before your next appointment, please call your pharmacy*  Lab Work: NONE   Testing/Procedures: NONE  Follow-Up: At [facility], you and your health needs are our priority.  As part of our continuing mission to provide you with exceptional heart care, we have created designated Provider Care Teams.  These Care Teams include your primary Cardiologist (physician) and Advanced Practice Providers (APPs -  Physician Assistants and Nurse Practitioners) who all work together to provide you with the care you need, when you need it.  We recommend signing up for the patient portal called "MyChart".  Sign up information is provided on this After Visit Summary.  MyChart is used to connect with patients for Virtual Visits (Telemedicine).  Patients are able to view lab/test results, encounter notes, upcoming appointments, etc.  Non-urgent messages can be sent to your provider as well.   To learn more about what you can do with MyChart, go to [email or web address].    Your next appointment:   6 month(s)  The format for your next appointment:   In Person  Provider:   Gillian Shields, NP  Your physician recommends that you schedule a follow-up appointment in: 1 YEAR WITH DR [facility]

## 2021-07-14 NOTE — Assessment & Plan Note (Signed)
She continues to struggle with expressive aphasia.  Continue Eliquis.  Her previous stroke occurred when therapeutic on warfarin.

## 2021-07-14 NOTE — Assessment & Plan Note (Signed)
She is due for fasting lipids but is scheduled to see her PCP next month.  Continue atorvastatin.  LDL goal is less than 70.

## 2021-07-14 NOTE — Assessment & Plan Note (Signed)
She is euvolemic and doing well.  Her amlodipine was previously reduced but there was no improvement in her swelling.  She continues to have mild right lower extremity swelling that improves with wearing her compression sock.  It was recommended that she continue to do this more regularly and elevate her legs when sitting.  Continue amlodipine, nadolol, and hydralazine.

## 2021-07-14 NOTE — Assessment & Plan Note (Signed)
Blood pressure well-controlled on amlodipine, hydralazine, and nadolol.

## 2021-07-23 DIAGNOSIS — R7301 Impaired fasting glucose: Secondary | ICD-10-CM | POA: Diagnosis not present

## 2021-07-23 DIAGNOSIS — E039 Hypothyroidism, unspecified: Secondary | ICD-10-CM | POA: Diagnosis not present

## 2021-07-23 DIAGNOSIS — E559 Vitamin D deficiency, unspecified: Secondary | ICD-10-CM | POA: Diagnosis not present

## 2021-07-23 DIAGNOSIS — E7800 Pure hypercholesterolemia, unspecified: Secondary | ICD-10-CM | POA: Diagnosis not present

## 2021-07-30 DIAGNOSIS — Z1331 Encounter for screening for depression: Secondary | ICD-10-CM | POA: Diagnosis not present

## 2021-07-30 DIAGNOSIS — Z0000 Encounter for general adult medical examination without abnormal findings: Secondary | ICD-10-CM | POA: Diagnosis not present

## 2021-07-30 DIAGNOSIS — Z7901 Long term (current) use of anticoagulants: Secondary | ICD-10-CM | POA: Diagnosis not present

## 2021-07-30 DIAGNOSIS — E039 Hypothyroidism, unspecified: Secondary | ICD-10-CM | POA: Diagnosis not present

## 2021-07-30 DIAGNOSIS — Z23 Encounter for immunization: Secondary | ICD-10-CM | POA: Diagnosis not present

## 2021-07-30 DIAGNOSIS — I5032 Chronic diastolic (congestive) heart failure: Secondary | ICD-10-CM | POA: Diagnosis not present

## 2021-07-30 DIAGNOSIS — E7800 Pure hypercholesterolemia, unspecified: Secondary | ICD-10-CM | POA: Diagnosis not present

## 2021-07-30 DIAGNOSIS — R82998 Other abnormal findings in urine: Secondary | ICD-10-CM | POA: Diagnosis not present

## 2021-07-30 DIAGNOSIS — I059 Rheumatic mitral valve disease, unspecified: Secondary | ICD-10-CM | POA: Diagnosis not present

## 2021-07-30 DIAGNOSIS — I4821 Permanent atrial fibrillation: Secondary | ICD-10-CM | POA: Diagnosis not present

## 2021-07-30 DIAGNOSIS — D6869 Other thrombophilia: Secondary | ICD-10-CM | POA: Diagnosis not present

## 2021-07-30 DIAGNOSIS — I679 Cerebrovascular disease, unspecified: Secondary | ICD-10-CM | POA: Diagnosis not present

## 2021-07-30 DIAGNOSIS — R350 Frequency of micturition: Secondary | ICD-10-CM | POA: Diagnosis not present

## 2021-07-30 DIAGNOSIS — I10 Essential (primary) hypertension: Secondary | ICD-10-CM | POA: Diagnosis not present

## 2021-08-10 ENCOUNTER — Other Ambulatory Visit: Payer: Self-pay | Admitting: Cardiovascular Disease

## 2021-08-30 ENCOUNTER — Other Ambulatory Visit: Payer: Self-pay | Admitting: Cardiovascular Disease

## 2021-09-01 NOTE — Telephone Encounter (Signed)
Rx(s) sent to pharmacy electronically.  

## 2021-09-27 ENCOUNTER — Other Ambulatory Visit: Payer: Self-pay | Admitting: Cardiovascular Disease

## 2021-11-15 ENCOUNTER — Other Ambulatory Visit: Payer: Self-pay | Admitting: Cardiovascular Disease

## 2021-11-17 NOTE — Telephone Encounter (Signed)
Prescription refill request for Eliquis received. °Indication:Afib °Last office visit:9/22 °Scr:Needs Labs °Age: 88 °Weight:58.7 kg ° °Prescription refilled ° °

## 2021-12-14 ENCOUNTER — Other Ambulatory Visit: Payer: Self-pay | Admitting: Cardiovascular Disease

## 2021-12-15 ENCOUNTER — Telehealth: Payer: Self-pay

## 2021-12-15 ENCOUNTER — Other Ambulatory Visit: Payer: Self-pay

## 2021-12-15 DIAGNOSIS — I4819 Other persistent atrial fibrillation: Secondary | ICD-10-CM

## 2021-12-15 NOTE — Telephone Encounter (Signed)
Prescription refill request for Eliquis received. Indication:Afib Last office visit:9/22 [id number] Labs Age: 88 Weight:58.7 kg  Prescription refilled

## 2021-12-15 NOTE — Telephone Encounter (Signed)
Patient needs BMET/CBC for Eliquis refill.  They need to come to office.

## 2022-01-21 ENCOUNTER — Other Ambulatory Visit: Payer: Self-pay | Admitting: Cardiovascular Disease

## 2022-01-21 NOTE — Telephone Encounter (Signed)
Eliquis refill  

## 2022-01-21 NOTE — Telephone Encounter (Signed)
Prescription refill request for Eliquis received. ?Indication:Afib ?Last office visit:9/22 ?[id number] labs ?Age: 88 ?Weight:58.7 kg ? ?Prescription refilled ? ?

## 2022-01-31 ENCOUNTER — Other Ambulatory Visit: Payer: Self-pay | Admitting: Cardiovascular Disease

## 2022-02-02 DIAGNOSIS — Z20822 Contact with and (suspected) exposure to covid-19: Secondary | ICD-10-CM | POA: Diagnosis not present

## 2022-02-06 DIAGNOSIS — Z1331 Encounter for screening for depression: Secondary | ICD-10-CM | POA: Diagnosis not present

## 2022-02-06 DIAGNOSIS — E7800 Pure hypercholesterolemia, unspecified: Secondary | ICD-10-CM | POA: Diagnosis not present

## 2022-02-06 DIAGNOSIS — I5032 Chronic diastolic (congestive) heart failure: Secondary | ICD-10-CM | POA: Diagnosis not present

## 2022-02-06 DIAGNOSIS — I4821 Permanent atrial fibrillation: Secondary | ICD-10-CM | POA: Diagnosis not present

## 2022-02-06 DIAGNOSIS — Z7901 Long term (current) use of anticoagulants: Secondary | ICD-10-CM | POA: Diagnosis not present

## 2022-02-06 DIAGNOSIS — E559 Vitamin D deficiency, unspecified: Secondary | ICD-10-CM | POA: Diagnosis not present

## 2022-02-06 DIAGNOSIS — I69998 Other sequelae following unspecified cerebrovascular disease: Secondary | ICD-10-CM | POA: Diagnosis not present

## 2022-02-06 DIAGNOSIS — I059 Rheumatic mitral valve disease, unspecified: Secondary | ICD-10-CM | POA: Diagnosis not present

## 2022-02-06 DIAGNOSIS — R7301 Impaired fasting glucose: Secondary | ICD-10-CM | POA: Diagnosis not present

## 2022-02-06 DIAGNOSIS — Z1339 Encounter for screening examination for other mental health and behavioral disorders: Secondary | ICD-10-CM | POA: Diagnosis not present

## 2022-02-06 DIAGNOSIS — D6869 Other thrombophilia: Secondary | ICD-10-CM | POA: Diagnosis not present

## 2022-02-06 DIAGNOSIS — I10 Essential (primary) hypertension: Secondary | ICD-10-CM | POA: Diagnosis not present

## 2022-02-09 ENCOUNTER — Telehealth (HOSPITAL_BASED_OUTPATIENT_CLINIC_OR_DEPARTMENT_OTHER): Payer: Self-pay | Admitting: Cardiovascular Disease

## 2022-02-09 NOTE — Telephone Encounter (Signed)
Patient would like to speak with someone regarding the issues she has had trying to get her mother's Eliquis refilled ?

## 2022-02-10 NOTE — Telephone Encounter (Signed)
Returned call to patients daughter [name] to assess what issues they were having to refill patients's  Eliquis.  [name] verbalized frustration about requesting a refill for Eliquis around the beginning of April.  States she was told by staff Eliquis could not be refilled until patient had the required Labwork per protocol.  Refill was given for one week, and note to pharmacy labwork was required prior to next refill. Later in the week another refill was requested, however there had been no labwork.  Patient's daughter states they called PCP who did labs and refilled Eliquis.  No labs noted in Epic or Care Everywhere call to Dr Deneen Harts office to request recent labs be faxed.  Discussed this situation with Pharm D supervisor who states due to patient's age and weight it is ok to provide refills without labs, however labwork is preferred.  Patients daughter notified and is aware. Pharm D supervisor name, and Clinic manager name provided if there are any issues in the future. Jim Like MHA RN CCM ?

## 2022-02-23 DIAGNOSIS — Z20822 Contact with and (suspected) exposure to covid-19: Secondary | ICD-10-CM | POA: Diagnosis not present

## 2022-03-05 ENCOUNTER — Encounter (HOSPITAL_BASED_OUTPATIENT_CLINIC_OR_DEPARTMENT_OTHER): Payer: Self-pay

## 2022-03-26 ENCOUNTER — Other Ambulatory Visit: Payer: Self-pay | Admitting: Cardiovascular Disease

## 2022-03-26 ENCOUNTER — Telehealth: Payer: Self-pay | Admitting: Cardiovascular Disease

## 2022-03-26 NOTE — Telephone Encounter (Signed)
*  STAT* If patient is at the pharmacy, call can be transferred to refill team.   1. Which medications need to be refilled? (please list name of each medication and dose if known)   hydrALAZINE (APRESOLINE) 25 MG tablet    2. Which pharmacy/location (including street and city if local pharmacy) is medication to be sent to? [facility] [address] [place] - [address]  3. Do they need a 30 day or 90 day supply?  90 day

## 2022-03-27 MED ORDER — HYDRALAZINE HCL 25 MG PO TABS: 25.0000 mg | ORAL_TABLET | Freq: Two times a day (BID) | ORAL | 1 refills | Status: DC

## 2022-03-27 NOTE — Telephone Encounter (Signed)
Rx request sent to pharmacy.  

## 2022-03-27 NOTE — Telephone Encounter (Signed)
Rx(s) sent to pharmacy electronically.  

## 2022-04-28 ENCOUNTER — Other Ambulatory Visit: Payer: Self-pay | Admitting: Cardiovascular Disease

## 2022-04-28 DIAGNOSIS — I4819 Other persistent atrial fibrillation: Secondary | ICD-10-CM

## 2022-04-28 NOTE — Telephone Encounter (Signed)
Please review for refill. Thank you! 

## 2022-04-28 NOTE — Telephone Encounter (Signed)
Pt last saw Dr Duke Salvia 07/14/21, last labs per [facility] 02/06/22 at [facility] but actual results have not been loaded in there.  Called PCP office sent to VM to request most recent lab results to [phone].  Will await results to complete refill request. Age 88, weight 58.7kg, based on specified criteria pt is on appropriate dosage of Eliquis 2.5mg  BID for afib.  Will await lab results to refill rx.

## 2022-04-28 NOTE — Telephone Encounter (Signed)
Eliquis 2.5mg  refill request received. Patient is 88 years old, weight-58.7kg, Crea-1.4 on 02/10/2022 via scanned labs from PCP, Diagnosis-CVA, and last seen by Dr. Duke Salvia on 07/14/2021. Dose is appropriate based on dosing criteria. Will send in refill to requested pharmacy.

## 2022-06-06 ENCOUNTER — Other Ambulatory Visit: Payer: Self-pay | Admitting: Cardiovascular Disease

## 2022-06-08 NOTE — Telephone Encounter (Signed)
Rx(s) sent to pharmacy electronically.  

## 2022-07-03 ENCOUNTER — Telehealth: Payer: Self-pay | Admitting: Cardiovascular Disease

## 2022-07-03 NOTE — Telephone Encounter (Signed)
Spoke with daughter and suggested patient assistance She stated didn't think mother quite understood and father would not do it  She will discuss with them again  

## 2022-07-03 NOTE — Telephone Encounter (Signed)
  Patient calling the office for samples of medication:   1.  What medication and dosage are you requesting samples for? apixaban (ELIQUIS) 2.5 MG TABS tablet   2.  Are you currently out of this medication? Yes     Pt's husband said, him and his wife is on a donut hole and requesting samples. He said, they cant afford to pay eliquis in full

## 2022-07-29 DIAGNOSIS — E7800 Pure hypercholesterolemia, unspecified: Secondary | ICD-10-CM | POA: Diagnosis not present

## 2022-07-29 DIAGNOSIS — R7989 Other specified abnormal findings of blood chemistry: Secondary | ICD-10-CM | POA: Diagnosis not present

## 2022-07-29 DIAGNOSIS — E559 Vitamin D deficiency, unspecified: Secondary | ICD-10-CM | POA: Diagnosis not present

## 2022-07-29 DIAGNOSIS — E039 Hypothyroidism, unspecified: Secondary | ICD-10-CM | POA: Diagnosis not present

## 2022-07-29 DIAGNOSIS — I10 Essential (primary) hypertension: Secondary | ICD-10-CM | POA: Diagnosis not present

## 2022-07-29 DIAGNOSIS — R7301 Impaired fasting glucose: Secondary | ICD-10-CM | POA: Diagnosis not present

## 2022-08-05 DIAGNOSIS — I059 Rheumatic mitral valve disease, unspecified: Secondary | ICD-10-CM | POA: Diagnosis not present

## 2022-08-05 DIAGNOSIS — I10 Essential (primary) hypertension: Secondary | ICD-10-CM | POA: Diagnosis not present

## 2022-08-05 DIAGNOSIS — I4821 Permanent atrial fibrillation: Secondary | ICD-10-CM | POA: Diagnosis not present

## 2022-08-05 DIAGNOSIS — R82998 Other abnormal findings in urine: Secondary | ICD-10-CM | POA: Diagnosis not present

## 2022-08-05 DIAGNOSIS — Z23 Encounter for immunization: Secondary | ICD-10-CM | POA: Diagnosis not present

## 2022-08-05 DIAGNOSIS — I679 Cerebrovascular disease, unspecified: Secondary | ICD-10-CM | POA: Diagnosis not present

## 2022-08-05 DIAGNOSIS — D6869 Other thrombophilia: Secondary | ICD-10-CM | POA: Diagnosis not present

## 2022-08-05 DIAGNOSIS — I69998 Other sequelae following unspecified cerebrovascular disease: Secondary | ICD-10-CM | POA: Diagnosis not present

## 2022-08-05 DIAGNOSIS — R350 Frequency of micturition: Secondary | ICD-10-CM | POA: Diagnosis not present

## 2022-08-05 DIAGNOSIS — E7800 Pure hypercholesterolemia, unspecified: Secondary | ICD-10-CM | POA: Diagnosis not present

## 2022-08-05 DIAGNOSIS — Z1331 Encounter for screening for depression: Secondary | ICD-10-CM | POA: Diagnosis not present

## 2022-08-05 DIAGNOSIS — Z1339 Encounter for screening examination for other mental health and behavioral disorders: Secondary | ICD-10-CM | POA: Diagnosis not present

## 2022-08-05 DIAGNOSIS — R7301 Impaired fasting glucose: Secondary | ICD-10-CM | POA: Diagnosis not present

## 2022-08-05 DIAGNOSIS — I5032 Chronic diastolic (congestive) heart failure: Secondary | ICD-10-CM | POA: Diagnosis not present

## 2022-08-05 DIAGNOSIS — Z7901 Long term (current) use of anticoagulants: Secondary | ICD-10-CM | POA: Diagnosis not present

## 2022-08-05 DIAGNOSIS — Z0000 Encounter for general adult medical examination without abnormal findings: Secondary | ICD-10-CM | POA: Diagnosis not present

## 2022-08-05 DIAGNOSIS — K5909 Other constipation: Secondary | ICD-10-CM | POA: Diagnosis not present

## 2022-08-27 ENCOUNTER — Other Ambulatory Visit: Payer: Self-pay | Admitting: Cardiovascular Disease

## 2022-08-27 NOTE — Telephone Encounter (Signed)
Please call pt to schedule appt with Dr. Duke Salvia or APP. Last seen 06/2021, was supposed to f/u 6 mo with Luther Parody, NP and 1 year with Dr. Duke Salvia. Needs appointment for refills.

## 2022-08-28 ENCOUNTER — Telehealth (HOSPITAL_BASED_OUTPATIENT_CLINIC_OR_DEPARTMENT_OTHER): Payer: Self-pay | Admitting: Cardiovascular Disease

## 2022-08-28 NOTE — Telephone Encounter (Signed)
Left message for patient to call and schedule overdue appointment with Dr. [place]/Caitlin Walker, NP to refill medications 

## 2022-09-07 NOTE — Telephone Encounter (Signed)
Rx(s) sent to pharmacy electronically.  

## 2022-09-08 ENCOUNTER — Ambulatory Visit (HOSPITAL_BASED_OUTPATIENT_CLINIC_OR_DEPARTMENT_OTHER): Payer: Medicare Other | Admitting: Family

## 2022-10-09 ENCOUNTER — Encounter (HOSPITAL_BASED_OUTPATIENT_CLINIC_OR_DEPARTMENT_OTHER): Payer: Self-pay | Admitting: Family

## 2022-10-09 ENCOUNTER — Ambulatory Visit (INDEPENDENT_AMBULATORY_CARE_PROVIDER_SITE_OTHER): Payer: Medicare Other | Admitting: Family

## 2022-10-09 VITALS — BP 122/80 | HR 65 | Ht 59.0 in | Wt 137.0 lb

## 2022-10-09 DIAGNOSIS — I4819 Other persistent atrial fibrillation: Secondary | ICD-10-CM

## 2022-10-09 DIAGNOSIS — D6859 Other primary thrombophilia: Secondary | ICD-10-CM

## 2022-10-09 DIAGNOSIS — E785 Hyperlipidemia, unspecified: Secondary | ICD-10-CM

## 2022-10-09 DIAGNOSIS — Z8673 Personal history of transient ischemic attack (TIA), and cerebral infarction without residual deficits: Secondary | ICD-10-CM

## 2022-10-09 MED ORDER — ATORVASTATIN CALCIUM 40 MG PO TABS: 40.0000 mg | ORAL_TABLET | Freq: Every day | ORAL | 3 refills | Status: DC

## 2022-10-09 NOTE — Progress Notes (Signed)
Samples provided in clinic 4 bottles of Eilquis 2.5mg  LOT [id number] Exp 11/2022  Alver Sorrow, NP

## 2022-10-09 NOTE — Progress Notes (Signed)
Office Visit    Patient Name: [name] Date of Encounter: 10/09/2022  PCP:  Gaspar Garbe, MD   [facility]  Cardiologist:  Chilton Si, MD  Advanced Practice Provider:  No care team member to display Electrophysiologist:  None      Chief Complaint    [name] [name] is a 88 y.o. female presents today for hypertension follow up   Past Medical History    Past Medical History:  Diagnosis Date   Atrial fibrillation (HCC)    Chronic anticoagulation    Chronic anticoagulation    CVA (cerebrovascular accident) (HCC)    Diabetes mellitus    pt & husband deny any knowledge of this    History of chicken pox    Hyperlipidemia    Hypertension    Past Surgical History:  Procedure Laterality Date   APPENDECTOMY     BACK SURGERY     CARDIAC CATHETERIZATION  11/09/91   EF 70%   DILATION AND CURETTAGE OF UTERUS     IR PERCUTANEOUS ART THROMBECTOMY/INFUSION INTRACRANIAL INC DIAG ANGIO  10/03/2018   RADIOLOGY WITH ANESTHESIA N/A 10/03/2018   Procedure: IR WITH ANESTHESIA;  Surgeon: Radiologist, Medication, MD;  Location: [facility] OR;  Service: Radiology;  Laterality: N/A;    Allergies  Allergies  Allergen Reactions   Cozaar Other (See Comments)    "Almost passed out"   Hydralazine Other (See Comments)    "almost passed out"   Hyzaar [Losartan Potassium-Hctz] Other (See Comments)    "almost passed out"   Sulfa Drugs Cross Reactors Other (See Comments)    "turned me blue"    Lisinopril     Unknown reaction, per pt    History of Present Illness    [name] is a 88 y.o. female with a hx of chronic diastolic heart failure, persistent atrial fibrillation, CVA (expressive aphasia), moderate left subclavian stenosis, hyperlipidemia, hypothyroidism last seen 07/14/21 by Dr. Duke Salvia.  Prior patient of Dr. Patty Sermons. Echo 01/2012 LVEF 50-55%. Myoview 08/2015 noted atrial fib but no ischemia. Prior thoracic  vertebral compression fracture with kyphoplasty complicated by paraspinal hematoma.   Admitted 09/2018 with L MCA stroke. INR therapeutic on admission. IR revascularization unsuccessful. Echo 09/2018 LVEF 55-60%, akinesis of basal and mid anteroseptal myocardium. Warfarin replaced with Eliquis. Amlodipine increased for BP. Later reduced for edema but was able to tolerate 7.5mg .   Last seen 07/14/21 doing overall well. Still struggling with expressive aphasia. Breathing had improved.   Presents today with her husband and daughter. Doing well since last seen. Mobility limited post stroke and has gained some weight. Mild swelling in her right ankle which is overall unchanged from previous. Reports no shortness of breath nor dyspnea on exertion. Reports no chest pain, pressure, or tightness. No edema, orthopnea, PND. Reports no palpitations.    EKGs/Labs/Other Studies Reviewed:   The following studies were reviewed today:  Echo 10/04/18: Study Conclusions   - Left ventricle: The cavity size was normal. There was mild   concentric hypertrophy. Systolic function was normal. The   estimated ejection fraction was in the range of 55% to 60%.   Akinesis of the basal and mid anteroseptal walls. - Aortic valve: There was mild regurgitation. - Mitral valve: There was mild regurgitation. - Left atrium: The atrium was mildly dilated. - Right ventricle: The cavity size was normal. Wall thickness was   normal. Systolic function was normal. - Tricuspid valve: There was mild regurgitation. -  Pulmonic valve: There was no regurgitation. - Pulmonary arteries: Systolic pressure was within the normal   range. - Inferior vena cava: The vessel was normal in size. - Pericardium, extracardiac: There was no pericardial effusion.   EKG:  EKG is ordered today.  The ekg ordered today demonstrates rate controlled atrial fibrillation 65 bpm.   Recent Labs: No results found for requested labs within last 365 days.   Recent Lipid Panel    Component Value Date/Time   CHOL 166 03/01/2019 1456   TRIG 80 03/01/2019 1456   HDL 76 03/01/2019 1456   CHOLHDL 2.2 03/01/2019 1456   CHOLHDL 4.0 10/04/2018 0510   VLDL 18 10/04/2018 0510   LDLCALC 74 03/01/2019 1456   LDLCALC 78 10/13/2017 1658    Risk Assessment/Calculations:   CHA2DS2-VASc Score = 7   This indicates a 11.2% annual risk of stroke. The patient's score is based upon: CHF History: 1 HTN History: 1 Diabetes History: 0 Stroke History: 2 Vascular Disease History: 0 Age Score: 2 Gender Score: 1     Home Medications   Current Meds  Medication Sig   acetaminophen (TYLENOL) 325 MG tablet Take 2 tablets (650 mg total) by mouth every 4 (four) hours as needed for mild pain (or temp > 37.5 C (99.5 F)).   amLODipine (NORVASC) 5 MG tablet TAKE 1 & 1/2 (ONE & ONE-HALF) TABLETS BY MOUTH ONCE DAILY   apixaban (ELIQUIS) 2.5 MG TABS tablet Take 1 tablet (2.5 mg total) by mouth 2 (two) times daily.   atorvastatin (LIPITOR) 20 MG tablet Take 1 tablet by mouth once daily   hydrALAZINE (APRESOLINE) 25 MG tablet Take 1 tablet (25 mg total) by mouth in the morning and at bedtime.   levothyroxine (SYNTHROID, LEVOTHROID) 25 MCG tablet Take 1 tablet (25 mcg total) by mouth daily before breakfast.   nadolol (CORGARD) 20 MG tablet Take 1/2 (one-half) tablet by mouth once daily   nitroGLYCERIN (NITROSTAT) 0.4 MG SL tablet Place 0.4 mg under the tongue every 5 (five) minutes as needed (chest pain).    pantoprazole (PROTONIX) 40 MG tablet Take 1 tablet (40 mg total) by mouth daily.     Review of Systems      All other systems reviewed and are otherwise negative except as noted above.  Physical Exam    VS:  BP 122/80   Pulse 65   Ht 4\' 11"  (1.499 m)   Wt 137 lb (62.1 kg)   BMI 27.67 kg/m  , BMI Body mass index is 27.67 kg/m.  Wt Readings from Last 3 Encounters:  10/09/22 137 lb (62.1 kg)  07/14/21 129 lb 6.4 oz (58.7 kg)  09/17/20 125 lb (56.7 kg)     GEN: Well nourished, overweight, well developed, in no acute distress. HEENT: normal. Neck: Supple, no JVD, carotid bruits, or masses. Cardiac: RRR, no murmurs, rubs, or gallops. No clubbing, cyanosis, edema.  Radials/PT 2+ and equal bilaterally.  Respiratory:  Respirations regular and unlabored, clear to auscultation bilaterally. GI: Soft, nontender, nondistended. MS: No deformity or atrophy. Skin: Warm and dry, no rash. Neuro:  Strength and sensation are intact. Psych: Normal affect.  Assessment & Plan    Chronic diastolic heart failure - Euvolemic and well compensated on exam. Continue Nadolol, Hydralazine. No indication for loop diuretic at this time.   Hx of CVA - Continue  statin. NO aspirin due to [facility].   Permanent atrial fibrillation / Hypercoagulable state - Rate controlled on Nadolol .unaware of her atrial fibrillation.  Rate controlled today. Continue Eliquis 2.5mg  BID (dose reduced due to age, renal function). CHA2DS2-VASc Score = 7 [CHF History: 1, HTN History: 1, Diabetes History: 0, Stroke History: 2, Vascular Disease History: 0, Age Score: 2, Gender Score: 1].  Therefore, the patient's annual risk of stroke is 11.2 %.      HLD - LDL not at goal <70. Given hx of stroke with be very aggressive regarding home. Increase Atorvastatin to 40mg  QD. She will have repeat FLP/LFT at her PCP visit in 3 months. If LDL not at goal at that time consider Zetia.   HTN - BP well controlled. Continue current antihypertensive regimen.           Disposition: Follow up in 1 year(s) with , MD or APP.  Signed, Chilton Si, NP 10/09/2022, 11:02 AM [facility]

## 2022-10-09 NOTE — Patient Instructions (Addendum)
Medication Instructions:  Increase atorvastatin to 40 mg daily *If you need a refill on your cardiac medications before your next appointment, please call your pharmacy*   Lab Work: Lipids and Comprehensive metabolic panel in 3 months at Dr. Deneen Harts office If you have labs (blood work) drawn today and your tests are completely normal, you will receive your results only by: MyChart Message (if you have MyChart) OR A paper copy in the mail If you have any lab test that is abnormal or we need to change your treatment, we will call you to review the results.   Testing/Procedures: None   Follow-Up: At [facility], you and your health needs are our priority.  As part of our continuing mission to provide you with exceptional heart care, we have created designated Provider Care Teams.  These Care Teams include your primary Cardiologist (physician) and Advanced Practice Providers (APPs -  Physician Assistants and Nurse Practitioners) who all work together to provide you with the care you need, when you need it.  We recommend signing up for the patient portal called "MyChart".  Sign up information is provided on this After Visit Summary.  MyChart is used to connect with patients for Virtual Visits (Telemedicine).  Patients are able to view lab/test results, encounter notes, upcoming appointments, etc.  Non-urgent messages can be sent to your provider as well.   To learn more about what you can do with MyChart, go to [email or web address].    Your next appointment:   12 month(s)  The format for your next appointment:   In Person  Provider:   Chilton Si, MD   Other Instructions None  Exercise recommendations: The American Heart Association recommends 150 minutes of moderate intensity exercise weekly. Try 30 minutes of moderate intensity exercise 4-5 times per week. This could include walking, jogging, or swimming.   Heart Healthy Diet Recommendations: A low-salt diet  is recommended. Meats should be grilled, baked, or boiled. Avoid fried foods. Focus on lean protein sources like fish or chicken with vegetables and fruits. The American Heart Association is a Chief Technology Officer!  American Heart Association Diet and Lifeystyle Recommendations

## 2022-12-04 ENCOUNTER — Other Ambulatory Visit: Payer: Self-pay | Admitting: Cardiovascular Disease

## 2022-12-04 NOTE — Telephone Encounter (Signed)
Rx(s) sent to pharmacy electronically.  

## 2022-12-11 ENCOUNTER — Other Ambulatory Visit: Payer: Self-pay | Admitting: Cardiovascular Disease

## 2022-12-11 NOTE — Telephone Encounter (Signed)
Rx request sent to pharmacy.  

## 2023-02-05 DIAGNOSIS — I110 Hypertensive heart disease with heart failure: Secondary | ICD-10-CM | POA: Diagnosis not present

## 2023-02-05 DIAGNOSIS — R7301 Impaired fasting glucose: Secondary | ICD-10-CM | POA: Diagnosis not present

## 2023-02-05 DIAGNOSIS — I69998 Other sequelae following unspecified cerebrovascular disease: Secondary | ICD-10-CM | POA: Diagnosis not present

## 2023-02-05 DIAGNOSIS — R350 Frequency of micturition: Secondary | ICD-10-CM | POA: Diagnosis not present

## 2023-02-05 DIAGNOSIS — I5032 Chronic diastolic (congestive) heart failure: Secondary | ICD-10-CM | POA: Diagnosis not present

## 2023-02-05 DIAGNOSIS — D6869 Other thrombophilia: Secondary | ICD-10-CM | POA: Diagnosis not present

## 2023-02-05 DIAGNOSIS — I059 Rheumatic mitral valve disease, unspecified: Secondary | ICD-10-CM | POA: Diagnosis not present

## 2023-02-05 DIAGNOSIS — E039 Hypothyroidism, unspecified: Secondary | ICD-10-CM | POA: Diagnosis not present

## 2023-02-05 DIAGNOSIS — I4821 Permanent atrial fibrillation: Secondary | ICD-10-CM | POA: Diagnosis not present

## 2023-02-05 DIAGNOSIS — I679 Cerebrovascular disease, unspecified: Secondary | ICD-10-CM | POA: Diagnosis not present

## 2023-02-05 DIAGNOSIS — E7800 Pure hypercholesterolemia, unspecified: Secondary | ICD-10-CM | POA: Diagnosis not present

## 2023-02-05 DIAGNOSIS — Z7901 Long term (current) use of anticoagulants: Secondary | ICD-10-CM | POA: Diagnosis not present

## 2023-03-01 ENCOUNTER — Other Ambulatory Visit: Payer: Self-pay | Admitting: Cardiovascular Disease

## 2023-03-01 DIAGNOSIS — I4819 Other persistent atrial fibrillation: Secondary | ICD-10-CM

## 2023-03-01 NOTE — Telephone Encounter (Signed)
Please review for refill. Thank you! 

## 2023-03-01 NOTE — Telephone Encounter (Signed)
Prescription refill request for Eliquis received. Indication:afib Last office visit:12/23 ZOX:WRUEA labs Age: 89 Weight:62.1  kg  Prescription refilled

## 2023-03-07 ENCOUNTER — Other Ambulatory Visit: Payer: Self-pay | Admitting: Cardiovascular Disease

## 2023-03-08 NOTE — Telephone Encounter (Signed)
Rx(s) sent to pharmacy electronically.  

## 2023-03-25 ENCOUNTER — Other Ambulatory Visit: Payer: Self-pay | Admitting: Cardiovascular Disease

## 2023-03-25 DIAGNOSIS — I4819 Other persistent atrial fibrillation: Secondary | ICD-10-CM

## 2023-03-25 NOTE — Telephone Encounter (Signed)
Prescription refill request for Eliquis received. Indication: Afib  Last office visit: 10/09/22 Dan Humphreys)  Scr: 1.3 (07/29/22 via PCP)  Age: 89 Weight: 62.1kg  Per dosing criteria, pt's dose should be Eliquis 5mg  BID. Will forward to PharmD team to review.

## 2023-03-25 NOTE — Telephone Encounter (Signed)
Per PharmD, pt's dose should be changed to Eliquis 5mg  BID. Called and spoke with pt's daughter, Aram Beecham. Made her aware of Eliquis dose change, verbalized understanding. Refill sent.

## 2023-03-25 NOTE — Telephone Encounter (Signed)
Please review for refill. Thank you! 

## 2023-04-14 ENCOUNTER — Telehealth (HOSPITAL_BASED_OUTPATIENT_CLINIC_OR_DEPARTMENT_OTHER): Payer: Self-pay | Admitting: *Deleted

## 2023-04-14 NOTE — Telephone Encounter (Signed)
Spoke with Aquilla Hacker RN regarding daughter/patients unhappiness with not being able to be seen by Dr Duke Salvia for appointments in December  Patient and husband, who is also patient  like coming to visits together.  Left message to call back

## 2023-05-11 ENCOUNTER — Other Ambulatory Visit: Payer: Self-pay | Admitting: Cardiovascular Disease

## 2023-05-11 NOTE — Telephone Encounter (Signed)
Rx request sent to pharmacy.  

## 2023-07-14 NOTE — Telephone Encounter (Signed)
Patient has not returned call for scheduling, has active recall, removing from basket.

## 2023-08-04 DIAGNOSIS — E785 Hyperlipidemia, unspecified: Secondary | ICD-10-CM | POA: Diagnosis not present

## 2023-08-04 DIAGNOSIS — R7301 Impaired fasting glucose: Secondary | ICD-10-CM | POA: Diagnosis not present

## 2023-08-04 DIAGNOSIS — Z1389 Encounter for screening for other disorder: Secondary | ICD-10-CM | POA: Diagnosis not present

## 2023-08-04 DIAGNOSIS — E559 Vitamin D deficiency, unspecified: Secondary | ICD-10-CM | POA: Diagnosis not present

## 2023-08-04 DIAGNOSIS — E039 Hypothyroidism, unspecified: Secondary | ICD-10-CM | POA: Diagnosis not present

## 2023-08-11 DIAGNOSIS — E039 Hypothyroidism, unspecified: Secondary | ICD-10-CM | POA: Diagnosis not present

## 2023-08-11 DIAGNOSIS — K5909 Other constipation: Secondary | ICD-10-CM | POA: Diagnosis not present

## 2023-08-11 DIAGNOSIS — D6869 Other thrombophilia: Secondary | ICD-10-CM | POA: Diagnosis not present

## 2023-08-11 DIAGNOSIS — Z0000 Encounter for general adult medical examination without abnormal findings: Secondary | ICD-10-CM | POA: Diagnosis not present

## 2023-08-11 DIAGNOSIS — Z1331 Encounter for screening for depression: Secondary | ICD-10-CM | POA: Diagnosis not present

## 2023-08-11 DIAGNOSIS — R7301 Impaired fasting glucose: Secondary | ICD-10-CM | POA: Diagnosis not present

## 2023-08-11 DIAGNOSIS — E7800 Pure hypercholesterolemia, unspecified: Secondary | ICD-10-CM | POA: Diagnosis not present

## 2023-08-11 DIAGNOSIS — Z7901 Long term (current) use of anticoagulants: Secondary | ICD-10-CM | POA: Diagnosis not present

## 2023-08-11 DIAGNOSIS — I110 Hypertensive heart disease with heart failure: Secondary | ICD-10-CM | POA: Diagnosis not present

## 2023-08-11 DIAGNOSIS — Z23 Encounter for immunization: Secondary | ICD-10-CM | POA: Diagnosis not present

## 2023-08-11 DIAGNOSIS — Z1339 Encounter for screening examination for other mental health and behavioral disorders: Secondary | ICD-10-CM | POA: Diagnosis not present

## 2023-08-11 DIAGNOSIS — I10 Essential (primary) hypertension: Secondary | ICD-10-CM | POA: Diagnosis not present

## 2023-08-11 DIAGNOSIS — I679 Cerebrovascular disease, unspecified: Secondary | ICD-10-CM | POA: Diagnosis not present

## 2023-08-11 DIAGNOSIS — I4821 Permanent atrial fibrillation: Secondary | ICD-10-CM | POA: Diagnosis not present

## 2023-08-11 DIAGNOSIS — I5032 Chronic diastolic (congestive) heart failure: Secondary | ICD-10-CM | POA: Diagnosis not present

## 2023-08-11 DIAGNOSIS — R82998 Other abnormal findings in urine: Secondary | ICD-10-CM | POA: Diagnosis not present

## 2023-08-11 DIAGNOSIS — E559 Vitamin D deficiency, unspecified: Secondary | ICD-10-CM | POA: Diagnosis not present

## 2023-08-23 ENCOUNTER — Other Ambulatory Visit: Payer: Self-pay | Admitting: Cardiovascular Disease

## 2023-09-05 ENCOUNTER — Other Ambulatory Visit: Payer: Self-pay | Admitting: Cardiovascular Disease

## 2023-09-05 DIAGNOSIS — I4819 Other persistent atrial fibrillation: Secondary | ICD-10-CM

## 2023-09-06 NOTE — Telephone Encounter (Signed)
Prescription refill request for Eliquis received. Indication:afib Last office visit:12/23 Scr:1.36  10/24 Age: 89 Weight:62.1  kg  Prescription refilled

## 2023-09-30 ENCOUNTER — Other Ambulatory Visit (HOSPITAL_BASED_OUTPATIENT_CLINIC_OR_DEPARTMENT_OTHER): Payer: Self-pay | Admitting: Family

## 2023-09-30 DIAGNOSIS — E785 Hyperlipidemia, unspecified: Secondary | ICD-10-CM

## 2023-11-20 ENCOUNTER — Other Ambulatory Visit: Payer: Self-pay | Admitting: Cardiovascular Disease

## 2023-12-18 ENCOUNTER — Other Ambulatory Visit: Payer: Self-pay | Admitting: Cardiovascular Disease

## 2024-01-06 ENCOUNTER — Encounter (HOSPITAL_BASED_OUTPATIENT_CLINIC_OR_DEPARTMENT_OTHER): Payer: Self-pay | Admitting: Cardiovascular Disease

## 2024-01-06 ENCOUNTER — Other Ambulatory Visit: Payer: Self-pay

## 2024-01-06 ENCOUNTER — Ambulatory Visit (INDEPENDENT_AMBULATORY_CARE_PROVIDER_SITE_OTHER): Payer: Medicare Other | Admitting: Cardiovascular Disease

## 2024-01-06 ENCOUNTER — Other Ambulatory Visit (HOSPITAL_BASED_OUTPATIENT_CLINIC_OR_DEPARTMENT_OTHER): Payer: Self-pay

## 2024-01-06 VITALS — BP 118/70 | HR 80

## 2024-01-06 DIAGNOSIS — Z5181 Encounter for therapeutic drug level monitoring: Secondary | ICD-10-CM | POA: Diagnosis not present

## 2024-01-06 DIAGNOSIS — I4819 Other persistent atrial fibrillation: Secondary | ICD-10-CM

## 2024-01-06 MED ORDER — SPIRONOLACTONE 25 MG PO TABS: 25.0000 mg | ORAL_TABLET | Freq: Every day | ORAL | 3 refills | Status: DC

## 2024-01-06 MED ORDER — SPIRONOLACTONE 25 MG PO TABS
25.0000 mg | ORAL_TABLET | Freq: Every day | ORAL | 5 refills | Status: DC
  Filled 2024-01-06 (×2): qty 30, 30d supply, fill #0

## 2024-01-06 NOTE — Patient Instructions (Signed)
 Medication Instructions:  STOP AMLODIPINE   START SPIRONOLACTONE 25 MG DAILY   *If you need a refill on your cardiac medications before your next appointment, please call your pharmacy*  Lab Work: BMET WHEN YOU GO SEE DR Wylene Simmer   If you have labs (blood work) drawn today and your tests are completely normal, you will receive your results only by: MyChart Message (if you have MyChart) OR A paper copy in the mail If you have any lab test that is abnormal or we need to change your treatment, we will call you to review the results.  Testing/Procedures: NONE   Follow-Up: At [facility], you and your health needs are our priority.  As part of our continuing mission to provide you with exceptional heart care, we have created designated Provider Care Teams.  These Care Teams include your primary Cardiologist (physician) and Advanced Practice Providers (APPs -  Physician Assistants and Nurse Practitioners) who all work together to provide you with the care you need, when you need it.  We recommend signing up for the patient portal called "MyChart".  Sign up information is provided on this After Visit Summary.  MyChart is used to connect with patients for Virtual Visits (Telemedicine).  Patients are able to view lab/test results, encounter notes, upcoming appointments, etc.  Non-urgent messages can be sent to your provider as well.   To learn more about what you can do with MyChart, go to [email or web address].    Your next appointment:   2 to 3 months with Ronn Melena NP or Eula Fried NP, to follow up on blood pressure   6 MONTHS WITH CAITLIN W NP   12 MONTHS WITH DR [facility]

## 2024-01-06 NOTE — Progress Notes (Signed)
 Cardiology Office Note:  .   Date:  01/06/2024  ID:  [name], DOB 1930-06-20, MRN [id number] PCP: Janice Garbe, MD  [facility] Providers Cardiologist:  Janice Si, MD    History of Present Illness: .    [name] [name] is a 89 y.o. female with chronic diastolic heart failure, persistent atrial fibrillation, stroke on warfarin, expressive aphasia, moderate L subclavian stenosis, hyperlipidemia and hypothyroidism who presents for follow up.  She was previously a patient of Dr. Patty Martinez.  She had a [employer] 08/2015. She was noted to be in atrial fibrillation, but there is no evidence of ischemia.  Echocardiogram 01/2012 revealed LVEF 50-55%.  [name] had a thoracic  vertebral compression fracture and had to undergo kyphoplasty.   Warfarin was held and she was bridged with Lovenox.  This was complicated by a paraspinal hematoma.  She suffered from another vertebral fracture and struggles with pain in her back.  She is not a candidate for any further interventions.  She has been working with pain management and takes oxycodone at night to sleep. She had also broken her left wrist right shoulder and just finished physical therapy.    [name] was admitted 09/2018 with a L MCA stroke.  INR was therapeutic on admission.  IR attempted revascularization unsuccessfully.  Echo that admission revealed LVEF 55 to 60% with akinesis of the basal and mid anteroseptal myocardium.  Warfarin was replaced with Eliquis.    She has residual expressive aphasia.   Her blood pressure was poorly controlled so amlodipine was increased. Amlodipine was reduced due to LE edema. It was later increased back to 7.5 mg due to poorly-controlled blood pressure.  She saw Janice Shields, NP 09/2022 currently stable.  Atorvastatin was increased.  Discussed the use of AI scribe software for clinical note transcription with the patient, who gave verbal consent to  proceed.  History of Present Illness   [name] has been experiencing swelling in both feet, more pronounced in the right foot, with the swelling reaching at least three plus. She uses TED hose on the right leg, which helps reduce the swelling over a day or two. Previously, she was on PRN Lasix, which was stopped. Her blood pressure has been stable, averaging around 118. She is currently on amlodipine, which was previously reduced without significant impact on her symptoms. She also uses oxygen, typically at two liters, but sometimes at three liters. No recent falls, chest pain, or shortness of breath when lying down.  No issues with heart palpitations or racing heart.  She reports feeling generally fine with no significant complaints. She has a history of being treated for UTIs about six to seven months ago.  She is not engaging in much physical activity and does not have any equipment for exercise at home. She has not experienced any recent falls.      ROS:  As per HPI  Studies Reviewed: .       Echo 09/2018: Study Conclusions   - Left ventricle: The cavity size was normal. There was mild    concentric hypertrophy. Systolic function was normal. The    estimated ejection fraction was in the range of 55% to 60%.    Akinesis of the basal and mid anteroseptal walls.  - Aortic valve: There was mild regurgitation.  - Mitral valve: There was mild regurgitation.  - Left atrium: The atrium was mildly dilated.  - Right ventricle: The cavity size  was normal. Wall thickness was    normal. Systolic function was normal.  - Tricuspid valve: There was mild regurgitation.  - Pulmonic valve: There was no regurgitation.  - Pulmonary arteries: Systolic pressure was within the normal    range.  - Inferior vena cava: The vessel was normal in size.  - Pericardium, extracardiac: There was no pericardial effusion.   Risk Assessment/Calculations:    CHA2DS2-VASc Score = 8   This indicates a  10.8% annual risk of stroke. The patient's score is based upon: CHF History: 1 HTN History: 1 Diabetes History: 0 Stroke History: 2 Vascular Disease History: 1 Age Score: 2 Gender Score: 1            Physical Exam:   VS:  BP 118/70 (BP Location: Left Arm, Patient Position: Sitting, Cuff Size: Small)   Pulse 80   SpO2 94%  , BMI There is no height or weight on file to calculate BMI. GENERAL:  Well appearing HEENT: Pupils equal round and reactive, fundi not visualized, oral mucosa unremarkable NECK:  No jugular venous distention, waveform within normal limits, carotid upstroke brisk and symmetric, no bruits, no thyromegaly LUNGS:  Clear to auscultation bilaterally HEART:  Irregularly irregular.  PMI not displaced or sustained,S1 and S2 within normal limits, no S3, no S4, no clicks, no rubs, murmurs ABD:  Flat, positive bowel sounds normal in frequency in pitch, no bruits, no rebound, no guarding, no midline pulsatile mass, no hepatomegaly, no splenomegaly EXT:  2 plus pulses throughout, no edema, no cyanosis no clubbing SKIN:  No rashes no nodules NEURO:  Cranial nerves II through XII grossly intact, motor grossly intact throughout PSYCH:  Cognitively intact, oriented to person place and time   ASSESSMENT AND PLAN: .    # Peripheral Edema # HFpEF:  Chronic bilateral leg swelling, more pronounced in the right leg. Spironolactone chosen for edema and blood pressure management. Monitoring of potassium and renal function necessary.  Frequent UTIs so no SGLT2 inhibitor.  - Discontinue amlodipine. - Initiate spironolactone 25 mg daily. - Check blood pressure at home regularly. - Order basic metabolic panel in early April.  # Hypertension Blood pressure well-controlled at 118/80 mmHg. Spironolactone to replace amlodipine for dual management of blood pressure and edema. - Monitor blood pressure at home. - Replace amlodipine with spironolactone 25 mg daily.  # Persistent atrial  Fibrillation Chronic atrial fibrillation managed with Eliquis. No recent palpitations or tachycardia.  rate is well-controlled.   - Continue Eliquis and nadolol.   # prior CVA: # Hyperlipidemia LDL cholesterol at 76 mg/dL. On atorvastatin 40 mg daily. - Continue atorvastatin 40 mg daily.  # Decreased Mobility Limited mobility, shuffles to bathroom and chair. Encouraged light exercises for strength and mobility improvement. - Encourage use of light weights or water bottles for arm exercises. - Encourage leg lifts and other movements while seated.       Dispo: f/u in 2-3 months for BP.  6 months with APP, 1 year with me.   Signed, Janice Si, MD

## 2024-01-10 ENCOUNTER — Other Ambulatory Visit: Payer: Self-pay | Admitting: Cardiovascular Disease

## 2024-01-10 DIAGNOSIS — E785 Hyperlipidemia, unspecified: Secondary | ICD-10-CM

## 2024-01-10 DIAGNOSIS — I4819 Other persistent atrial fibrillation: Secondary | ICD-10-CM

## 2024-01-10 NOTE — Telephone Encounter (Signed)
*  STAT* If patient is at the pharmacy, call can be transferred to refill team.   1. Which medications need to be refilled? (please list name of each medication and dose if known) all medication by heartcare  2. Which pharmacy/location (including street and city if local pharmacy) is medication to be sent to? [facility] - [place], [place] - [address]   3. Do they need a 30 day or 90 day supply? 90

## 2024-01-14 MED ORDER — APIXABAN 5 MG PO TABS: 5.0000 mg | ORAL_TABLET | Freq: Two times a day (BID) | ORAL | 1 refills | Status: DC

## 2024-01-14 MED ORDER — SPIRONOLACTONE 25 MG PO TABS: 25.0000 mg | ORAL_TABLET | Freq: Every day | ORAL | 3 refills | Status: DC

## 2024-01-14 MED ORDER — ATORVASTATIN CALCIUM 40 MG PO TABS: 40.0000 mg | ORAL_TABLET | Freq: Every day | ORAL | 3 refills | Status: DC

## 2024-01-14 MED ORDER — NADOLOL 20 MG PO TABS: 10.0000 mg | ORAL_TABLET | Freq: Every day | ORAL | 3 refills | Status: DC

## 2024-01-14 MED ORDER — NITROGLYCERIN 0.4 MG SL SUBL: 0.4000 mg | SUBLINGUAL_TABLET | SUBLINGUAL | 3 refills | Status: AC | PRN

## 2024-01-14 MED ORDER — HYDRALAZINE HCL 25 MG PO TABS: ORAL_TABLET | ORAL | 3 refills | Status: DC

## 2024-01-14 NOTE — Telephone Encounter (Signed)
 Eliquis 5mg  refill request received. Patient is 89 years old, weight-62.1kg, Crea-1.36 on 08/04/23 via [employer] from [employer], [place], and last seen by Dr. Duke Salvia on 01/06/24. Dose is appropriate based on dosing criteria. Will send in refill to requested pharmacy.

## 2024-01-14 NOTE — Telephone Encounter (Signed)
 Pt's medications were sent to pt's pharmacy as requested. Confirmation received.

## 2024-01-18 DIAGNOSIS — R7301 Impaired fasting glucose: Secondary | ICD-10-CM | POA: Diagnosis not present

## 2024-01-18 DIAGNOSIS — D6869 Other thrombophilia: Secondary | ICD-10-CM | POA: Diagnosis not present

## 2024-01-18 DIAGNOSIS — E039 Hypothyroidism, unspecified: Secondary | ICD-10-CM | POA: Diagnosis not present

## 2024-01-18 DIAGNOSIS — I110 Hypertensive heart disease with heart failure: Secondary | ICD-10-CM | POA: Diagnosis not present

## 2024-01-18 DIAGNOSIS — I69998 Other sequelae following unspecified cerebrovascular disease: Secondary | ICD-10-CM | POA: Diagnosis not present

## 2024-01-18 DIAGNOSIS — R350 Frequency of micturition: Secondary | ICD-10-CM | POA: Diagnosis not present

## 2024-01-18 DIAGNOSIS — I10 Essential (primary) hypertension: Secondary | ICD-10-CM | POA: Diagnosis not present

## 2024-01-18 DIAGNOSIS — I059 Rheumatic mitral valve disease, unspecified: Secondary | ICD-10-CM | POA: Diagnosis not present

## 2024-01-18 DIAGNOSIS — E559 Vitamin D deficiency, unspecified: Secondary | ICD-10-CM | POA: Diagnosis not present

## 2024-01-18 DIAGNOSIS — K5909 Other constipation: Secondary | ICD-10-CM | POA: Diagnosis not present

## 2024-01-18 DIAGNOSIS — Z7901 Long term (current) use of anticoagulants: Secondary | ICD-10-CM | POA: Diagnosis not present

## 2024-01-18 DIAGNOSIS — I5032 Chronic diastolic (congestive) heart failure: Secondary | ICD-10-CM | POA: Diagnosis not present

## 2024-01-18 DIAGNOSIS — E7800 Pure hypercholesterolemia, unspecified: Secondary | ICD-10-CM | POA: Diagnosis not present

## 2024-01-18 DIAGNOSIS — I4821 Permanent atrial fibrillation: Secondary | ICD-10-CM | POA: Diagnosis not present

## 2024-04-10 ENCOUNTER — Encounter (HOSPITAL_BASED_OUTPATIENT_CLINIC_OR_DEPARTMENT_OTHER): Payer: Self-pay | Admitting: Nurse Practitioner

## 2024-04-10 ENCOUNTER — Ambulatory Visit (HOSPITAL_BASED_OUTPATIENT_CLINIC_OR_DEPARTMENT_OTHER): Admitting: Nurse Practitioner

## 2024-04-10 VITALS — BP 100/66 | HR 63 | Ht 59.0 in | Wt 135.0 lb

## 2024-04-10 DIAGNOSIS — I10 Essential (primary) hypertension: Secondary | ICD-10-CM | POA: Diagnosis not present

## 2024-04-10 DIAGNOSIS — E119 Type 2 diabetes mellitus without complications: Secondary | ICD-10-CM

## 2024-04-10 DIAGNOSIS — I4819 Other persistent atrial fibrillation: Secondary | ICD-10-CM

## 2024-04-10 DIAGNOSIS — Z8673 Personal history of transient ischemic attack (TIA), and cerebral infarction without residual deficits: Secondary | ICD-10-CM

## 2024-04-10 DIAGNOSIS — Z5181 Encounter for therapeutic drug level monitoring: Secondary | ICD-10-CM | POA: Diagnosis not present

## 2024-04-10 DIAGNOSIS — I69320 Aphasia following cerebral infarction: Secondary | ICD-10-CM

## 2024-04-10 NOTE — Progress Notes (Signed)
 Cardiology Office Note   Date:  04/10/2024  ID:  [name], DOB 11-11-1929, MRN [id number] PCP: Vernadine Charlie LELON, MD  [facility] Providers Cardiologist:  Annabella Scarce, MD     PMH Persistent atrial fibrillation Chronic HFpEF Stroke on warfarin Expressive aphasia Left subclavian stenosis Hyperlipidemia Hypothyroidism  Previous patient of Dr. Dominick, she established with Dr. Scarce 01/2020.  History of Lexiscan  Myoview 08/2015 noted to be in atrial fibrillation, no evidence of ischemia.  Echo 01/2012 revealed mildly reduced LVEF.  She had a thoracic vertebral compression fracture and had to undergo kyphoplasty.  Warfarin was held and she was bridged with Lovenox .  This was complicated by paraspinal hematoma.  She suffered another vertebral fracture and continues to struggle with back pain.  Working with pain management and taking oxycodone  only at night for sleep.  Admission 09/2018 with left MCA stroke.  INR was therapeutic on admission.  IR attempted revascularization unsuccessfully.  Warfarin was replaced with Eliquis .  She had residual expressive aphasia.  BP was poorly controlled and amlodipine  was increased.  Amlodipine  was later reduced due to LE edema but increased again later to 7.5 mg due to poorly controlled BP.  Echo 10/04/2018 with normal LVEF 55 to 60%, mild aortic and mitral valve regurgitation, akinesis of the basal and mid anterior septal walls.  Last cardiology clinic visit was 01/06/2024 with Dr. Scarce.  She reported swelling in both feet, more pronounced in the right with 3+ edema.  She uses TED hose on the right leg which helps reduce swelling over a day or 2.  She had previously been on as needed Lasix  but this had been stopped.  SBP has been stable averaging around 118.  She was on amlodipine  at the time.  History of oxygen dependence typically at 2 L sometimes increases to 3 L.  She reported limited activity at home.  She was advised to  discontinue amlodipine  and start spironolactone  25 mg daily.  SGLT2 inhibitor not recommended due to frequent UTIs.  She was continued on Eliquis  and nadolol  for management of A-fib.  LDL cholesterol was at 76 mg/dL.  She was advised to continue atorvastatin  40 mg daily.  She was encouraged to increase exercises for strength and mobility.  History of Present Illness Discussed the use of AI scribe software for clinical note transcription with the patient, who gave verbal consent to proceed.  History of Present Illness [name] is a pleasant 89 year old female who is here with her daughter today for follow-up of hypertension. She is generally aphasic post stroke, but does answer yes and no to questions. Her daughter was a Engineer, civil (consulting) for 50 years and cares for her.  She stopping amlodipine  and starting spironolactone , her right leg edema has significantly improved. Home BP ranging from 98 to 134 systolic and 60 to low 80s diastolic. She has not had any adverse side effects from the medication. With soft BP readings, she denies lightheadedness and there have been no falls or syncope. Her daughter reports that renal function was checked by her PCP on 01/24/24 and there were no concerns reported. Daughter reports no concerns with recent labs with the exception of A1c was slightly elevated, and her vitamin D  level was increased, leading to a reduction in her vitamin supplement. She bruises easily, but no significant bleeding concerns.  She denies chest pain, shortness of breath, orthopnea, PND, dizziness.   ROS: See HPI  Studies Reviewed      No  results found for: LIPOA  Risk Assessment/Calculations  CHA2DS2-VASc Score = 8   This indicates a 10.8% annual risk of stroke. The patient's score is based upon: CHF History: 1 HTN History: 1 Diabetes History: 0 Stroke History: 2 Vascular Disease History: 1 Age Score: 2 Gender Score: 1            Physical Exam VS:  BP 100/66   Pulse 63    Ht 4' 11 (1.499 m)   Wt 135 lb (61.2 kg)   SpO2 95%   BMI 27.27 kg/m    Wt Readings from Last 3 Encounters:  04/10/24 135 lb (61.2 kg)  10/09/22 137 lb (62.1 kg)  07/14/21 129 lb 6.4 oz (58.7 kg)    GEN: Well nourished, well developed in no acute distress NECK: No JVD; No carotid bruits CARDIAC: Irregular RR, no murmurs, rubs, gallops RESPIRATORY:  Clear to auscultation without rales, wheezing or rhonchi  ABDOMEN: Soft, non-tender, non-distended EXTREMITIES:  No edema; No deformity   Assessment & Plan Hypertension   Blood pressure is well-controlled with the current regimen. Occasional SBP < 100 mmHg, but no symptoms of orthostatic hypotension or falls. Labs completed with PCP on 01/18/24 with K+ of 4.7, creatinine 1.5, and eGFR 32.5. We will continue spironolactone  25 mg daily and have her return in 3 months for follow-up. Call with concerns prior to next appointment.  Persistent atrial fibrillation on chronic anticoagulation HR is well-controlled. No concern for tachycardia or frequent palpitations.  Easy bruising but no significant bleeding concerns. Creatinine 1.5 on 01/18/24. We will decrease Eliquis  to 2.5 mg twice daily which is appropriate dose for stroke prevention for CHA2DS2-VASc score of 8.   Edema   Right leg edema has improved since stopping amlodipine  and starting spironolactone . No changes to anti-hypertensive therapy today.   History of Stroke with Residual Aphasia She responds to questions with yes/no since stroke in 2019. No acute concerns today.   Diabetes mellitus   Daughter reports A1C slightly elevated at last office visit with PCP. Conservative management planned. No acute concerns today.         Disposition: 3 months with Dr. Raford or APP  Signed, Rosaline Bane, NP-C

## 2024-04-10 NOTE — Patient Instructions (Signed)
 Medication Instructions:  Your physician recommends that you continue on your current medications as directed. Please refer to the Current Medication list given to you today.   Follow-Up: At [facility], you and your health needs are our priority.  As part of our continuing mission to provide you with exceptional heart care, our providers are all part of one team.  This team includes your primary Cardiologist (physician) and Advanced Practice Providers or APPs (Physician Assistants and Nurse Practitioners) who all work together to provide you with the care you need, when you need it.  Your next appointment:   Follow up as scheduled

## 2024-04-13 ENCOUNTER — Telehealth: Payer: Self-pay | Admitting: Nurse Practitioner

## 2024-04-13 NOTE — Telephone Encounter (Signed)
 Patient's Eliquis  needs to be decreased to 2.5 mg twice daily based on most recent kidney function with creatinine of 1.5, age > 80 and weight is close to 60 kg.   Please notify patient, have her stop Eliquis  5 mg and send new Rx to pharmacy of her choice.

## 2024-04-14 ENCOUNTER — Other Ambulatory Visit (HOSPITAL_BASED_OUTPATIENT_CLINIC_OR_DEPARTMENT_OTHER): Payer: Self-pay | Admitting: *Deleted

## 2024-04-14 MED ORDER — APIXABAN 2.5 MG PO TABS: 2.5000 mg | ORAL_TABLET | Freq: Two times a day (BID) | ORAL | 3 refills | Status: AC

## 2024-04-14 NOTE — Telephone Encounter (Signed)
 S/w pt's daughter per [facility]) is aware of Eliquis  change one (1) tablet by mouth (2.5 mg) twice daily. Medication list updated. Sent in to [employer].

## 2024-07-14 ENCOUNTER — Ambulatory Visit (HOSPITAL_BASED_OUTPATIENT_CLINIC_OR_DEPARTMENT_OTHER): Admitting: Family

## 2024-07-14 ENCOUNTER — Encounter (HOSPITAL_BASED_OUTPATIENT_CLINIC_OR_DEPARTMENT_OTHER): Payer: Self-pay | Admitting: Family

## 2024-07-14 VITALS — BP 112/73 | HR 73 | Wt 121.8 lb

## 2024-07-14 DIAGNOSIS — D6859 Other primary thrombophilia: Secondary | ICD-10-CM | POA: Diagnosis not present

## 2024-07-14 DIAGNOSIS — I10 Essential (primary) hypertension: Secondary | ICD-10-CM | POA: Diagnosis not present

## 2024-07-14 DIAGNOSIS — I4821 Permanent atrial fibrillation: Secondary | ICD-10-CM | POA: Diagnosis not present

## 2024-07-14 NOTE — Progress Notes (Signed)
 Cardiology Office Note   Date:  07/14/2024  ID:  [name], DOB May 21, 1930, MRN [id number] PCP: Vernadine Charlie LELON, MD  [facility] Providers Cardiologist:  Annabella Scarce, MD     History of Present Illness [name] is a 89 y.o. female with history of persistent atrial fibrillation, HFpEF, CVA on warfarin, expressive aphasia, left subclavian stenosis, hyperlipidemia, hypothyroidism.    Echo 01/2012 reduced LVEF.  Prior Myoview 08/2015 noted to be atrial fibrillation though no ischemia.  She had hypoplasty and warfarin held and bridged with Lovenox .  Complicated by paraspinal hematoma.  Continues to struggle with back pain.  [facility] 10/07/2018 with left MCA stroke with therapeutic INR and admission.  Revascularization unsuccessful.  Warfarin replaced with Eliquis .  Amlodipine  previously increased due to blood pressure.  Echo 09/2018 LVEF 55 to 60%, mild aortic and mitral valve regurgitation, akinesis of the basal and mid anterior wall septal.   Presents today for follow up with her husband and daughter. Daughter assists with history taking due to her aphasia. Reports no shortness of breath nor dyspnea on exertion. Reports no chest pain, pressure, or tightness. No edema, orthopnea, PND. Reports no palpitations.  LE edema much improved with Spironolactone . Labs upcoming with PCP 08/08/24.   ROS: Please see the history of present illness.    All other systems reviewed and are negative.   Studies Reviewed      Cardiac Studies & Procedures   ______________________________________________________________________________________________   STRESS TESTS  MYOCARDIAL PERFUSION IMAGING 09/05/2015  Interpretation Summary  There was no ST segment deviation noted during stress.  This is a low risk study.  There was normal perfusion at rest and with lexiscan  stress. No evidence of ischemia or scar. The study was nongated because of atrial fibrillation.    ECHOCARDIOGRAM  ECHOCARDIOGRAM COMPLETE 10/04/2018  Narrative *[name]* *[name]* [address] [place], [place] [address] [phone]  ------------------------------------------------------------------- Transthoracic Echocardiography  Patient:    Janice, Martinez MR #:       [id number] Study Date: 10/04/2018 Gender:     F Age:        54 Height:     157.5 cm Weight:     55.3 kg BSA:        1.56 m^2 Pt. Status: Room:       4N18C  TISA Claudene Alm JONELLE SONOGRAPHER  Tinnie Barefoot, RDCS, CCT PERFORMING   [facility], Inpatient ATTENDING    Ary Cummins, MD ADMITTING    Aroor, Claudio R  cc:  ------------------------------------------------------------------- LV EF: 55% -   60%  ------------------------------------------------------------------- Indications:      CVA 436.  ------------------------------------------------------------------- History:   PMH:   Atrial fibrillation.  Risk factors: Dyslipidemia.  ------------------------------------------------------------------- Study Conclusions  - Left ventricle: The cavity size was normal. There was mild concentric hypertrophy. Systolic function was normal. The estimated ejection fraction was in the range of 55% to 60%. Akinesis of the basal and mid anteroseptal walls. - Aortic valve: There was mild regurgitation. - Mitral valve: There was mild regurgitation. - Left atrium: The atrium was mildly dilated. - Right ventricle: The cavity size was normal. Wall thickness was normal. Systolic function was normal. - Tricuspid valve: There was mild regurgitation. - Pulmonic valve: There was no regurgitation. - Pulmonary arteries: Systolic pressure was within the normal range. - Inferior vena cava: The vessel was normal in size. - Pericardium, extracardiac: There was no pericardial effusion.  ------------------------------------------------------------------- Study data:  Comparison  was made to the study of 01/22/2012.  Study status:  Routine.  Procedure:  Transthoracic echocardiography. Image quality was adequate.  Study completion:  There were no complications.          Transthoracic echocardiography.  M-mode, complete 2D, spectral Doppler, and color Doppler.  Birthdate: Patient birthdate: [birth year].  Age:  Patient is 89 yr old.  Sex: Gender: female.    BMI: 22.3 kg/m^2.  Blood pressure:     130/58 Patient status:  Inpatient.  Study date:  Study date: 10/04/2018. Study time: 09:16 AM.  Location:  ICU/CCU  -------------------------------------------------------------------  ------------------------------------------------------------------- Left ventricle:  The cavity size was normal. There was mild concentric hypertrophy. Systolic function was normal. The estimated ejection fraction was in the range of 55% to 60%. Akinesis of the basal and mid anteroseptal walls. The study was not technically sufficient to allow evaluation of LV diastolic dysfunction due to atrial fibrillation.  ------------------------------------------------------------------- Aortic valve:   Trileaflet; mildly thickened, mildly calcified leaflets. Mobility was not restricted. Sclerosis without stenosis. Doppler:  Transvalvular velocity was within the normal range. There was no stenosis. There was mild regurgitation.  ------------------------------------------------------------------- Aorta:  Aortic root: The aortic root was normal in size.  ------------------------------------------------------------------- Mitral valve:   Structurally normal valve.   Mobility was not restricted.  Doppler:  Transvalvular velocity was within the normal range. There was no evidence for stenosis. There was mild regurgitation.  ------------------------------------------------------------------- Left atrium:  The atrium was mildly  dilated.  ------------------------------------------------------------------- Right ventricle:  The cavity size was normal. Wall thickness was normal. Systolic function was normal.  ------------------------------------------------------------------- Pulmonic valve:    Structurally normal valve.   Cusp separation was normal.  Doppler:  Transvalvular velocity was within the normal range. There was no evidence for stenosis. There was no regurgitation.  ------------------------------------------------------------------- Tricuspid valve:   Structurally normal valve.    Doppler: Transvalvular velocity was within the normal range. There was mild regurgitation.  ------------------------------------------------------------------- Pulmonary artery:   The main pulmonary artery was normal-sized. Systolic pressure was within the normal range.  ------------------------------------------------------------------- Right atrium:  The atrium was normal in size.  ------------------------------------------------------------------- Pericardium:  There was no pericardial effusion.  ------------------------------------------------------------------- Systemic veins: Inferior vena cava: The vessel was normal in size.  ------------------------------------------------------------------- Measurements  Left ventricle                         Value        Reference LV ID, ED, PLAX chordal        (L)     41    mm     43 - 52 LV ID, ES, PLAX chordal                25    mm     23 - 38 LV fx shortening, PLAX chordal         39    %      >=29 LV PW thickness, ED                    12    mm     ---------- IVS/LV PW ratio, ED                    0.83         <=1.3  Ventricular septum                     Value  Reference IVS thickness, ED                      10    mm     ----------  LVOT                                   Value        Reference LVOT ID, S                             17    mm      ---------- LVOT area                              2.27  cm^2   ----------  Aorta                                  Value        Reference Aortic root ID, ED                     29    mm     ----------  Left atrium                            Value        Reference LA ID, A-P, ES                         37    mm     ---------- LA ID/bsa, A-P                 (H)     2.37  cm/m^2 <=2.2 LA volume, S                           71.5  ml     ---------- LA volume/bsa, S                       45.8  ml/m^2 ---------- LA volume, ES, 1-p A4C                 70.3  ml     ---------- LA volume/bsa, ES, 1-p A4C             45    ml/m^2 ---------- LA volume, ES, 1-p A2C                 72.4  ml     ---------- LA volume/bsa, ES, 1-p A2C             46.4  ml/m^2 ----------  Right atrium                           Value        Reference RA ID, S-I, ES, A4C                    46.2  mm     34 - 49 RA area, ES, A4C  10.5  cm^2   8.3 - 19.5 RA volume, ES, A/L                     20.4  ml     ---------- RA volume/bsa, ES, A/L                 13.1  ml/m^2 ----------  Systemic veins                         Value        Reference Estimated CVP                          3     mm Hg  ----------  Legend: (L)  and  (H)  mark values outside specified reference range.  ------------------------------------------------------------------- Prepared and Electronically Authenticated by  Leim Moose, M.D. 2019-12-17T12:44:51          ______________________________________________________________________________________________      Risk Assessment/Calculations  CHA2DS2-VASc Score = 8   This indicates a 10.8% annual risk of stroke. The patient's score is based upon: CHF History: 1 HTN History: 1 Diabetes History: 0 Stroke History: 2 Vascular Disease History: 1 Age Score: 2 Gender Score: 1            Physical Exam VS:  BP 112/73 (BP Location: Left Arm, Patient Position: Sitting,  Cuff Size: Normal)   Pulse 73   Wt 121 lb 12.8 oz (55.2 kg)   SpO2 95%   BMI 24.60 kg/m        Wt Readings from Last 3 Encounters:  07/14/24 121 lb 12.8 oz (55.2 kg)  04/10/24 135 lb (61.2 kg)  10/09/22 137 lb (62.1 kg)    GEN: Well nourished, well developed in no acute distress NECK: No JVD; No carotid bruits CARDIAC: IRIR, no murmurs, rubs, gallops RESPIRATORY:  Clear to auscultation without rales, wheezing or rhonchi  ABDOMEN: Soft, non-tender, non-distended EXTREMITIES:  No edema; No deformity   ASSESSMENT AND PLAN  HTN - BP well controlled. Continue current antihypertensive regimen nadolol  10 mg daily, spironolactone  25 mg daily, hydralazine  25 mg twice daily.  Relatively hypotensive but no lightheadedness, dizziness.   Persistent atrial fibrillation / Hypercoagulable state - rate controlled. No palpitations. CHA2DS2-VASc Score = 8 [CHF History: 1, HTN History: 1, Diabetes History: 0, Stroke History: 2, Vascular Disease History: 1, Age Score: 2, Gender Score: 1].  Therefore, the patient's annual risk of stroke is 10.8 %.    Continue Eliquis  2.5mg  BID (reduced dose due to age, body habitus) and Nadolol  10mg  daily.  Hx of CVA -responds with yes/no send CVA 2019.  Continue atorvastatin  40 mg daily.  No ASA due to [facility]  DM2 - Continue to follow with PCP.   LE edema - improved with spironolactone  25mg  daily. No changes.        Dispo: follow up in 6 mos with Dr. Raford  Signed, Reche GORMAN Finder, NP

## 2024-07-14 NOTE — Patient Instructions (Addendum)
 Medication Instructions:  Continue your current medications  *If you need a refill on your cardiac medications before your next appointment, please call your pharmacy*  Follow-Up: At [facility], you and your health needs are our priority.  As part of our continuing mission to provide you with exceptional heart care, our providers are all part of one team.  This team includes your primary Cardiologist (physician) and Advanced Practice Providers or APPs (Physician Assistants and Nurse Practitioners) who all work together to provide you with the care you need, when you need it.  Your next appointment:   6 month(s)  Provider:   Annabella Scarce, MD    We recommend signing up for the patient portal called MyChart.  Sign up information is provided on this After Visit Summary.  MyChart is used to connect with patients for Virtual Visits (Telemedicine).  Patients are able to view lab/test results, encounter notes, upcoming appointments, etc.  Non-urgent messages can be sent to your provider as well.   To learn more about what you can do with MyChart, go to [email or web address].   Other Instructions  To prevent or reduce lower extremity swelling: Eat a low salt diet. Salt makes the body hold onto extra fluid which causes swelling. Sit with legs elevated. For example, in the recliner or on an ottoman.  Wear knee-high compression stockings during the daytime. Ones labeled 15-20 mmHg provide good compression.

## 2024-08-08 DIAGNOSIS — M810 Age-related osteoporosis without current pathological fracture: Secondary | ICD-10-CM | POA: Diagnosis not present

## 2024-08-08 DIAGNOSIS — I110 Hypertensive heart disease with heart failure: Secondary | ICD-10-CM | POA: Diagnosis not present

## 2024-08-08 DIAGNOSIS — Z0189 Encounter for other specified special examinations: Secondary | ICD-10-CM | POA: Diagnosis not present

## 2024-08-08 DIAGNOSIS — E7800 Pure hypercholesterolemia, unspecified: Secondary | ICD-10-CM | POA: Diagnosis not present

## 2024-08-08 DIAGNOSIS — E039 Hypothyroidism, unspecified: Secondary | ICD-10-CM | POA: Diagnosis not present

## 2024-08-08 DIAGNOSIS — I5032 Chronic diastolic (congestive) heart failure: Secondary | ICD-10-CM | POA: Diagnosis not present

## 2024-08-15 DIAGNOSIS — E559 Vitamin D deficiency, unspecified: Secondary | ICD-10-CM | POA: Diagnosis not present

## 2024-08-15 DIAGNOSIS — I110 Hypertensive heart disease with heart failure: Secondary | ICD-10-CM | POA: Diagnosis not present

## 2024-08-15 DIAGNOSIS — Z23 Encounter for immunization: Secondary | ICD-10-CM | POA: Diagnosis not present

## 2024-08-15 DIAGNOSIS — R7301 Impaired fasting glucose: Secondary | ICD-10-CM | POA: Diagnosis not present

## 2024-08-15 DIAGNOSIS — Z1339 Encounter for screening examination for other mental health and behavioral disorders: Secondary | ICD-10-CM | POA: Diagnosis not present

## 2024-08-15 DIAGNOSIS — K5909 Other constipation: Secondary | ICD-10-CM | POA: Diagnosis not present

## 2024-08-15 DIAGNOSIS — I5032 Chronic diastolic (congestive) heart failure: Secondary | ICD-10-CM | POA: Diagnosis not present

## 2024-08-15 DIAGNOSIS — R350 Frequency of micturition: Secondary | ICD-10-CM | POA: Diagnosis not present

## 2024-08-15 DIAGNOSIS — I679 Cerebrovascular disease, unspecified: Secondary | ICD-10-CM | POA: Diagnosis not present

## 2024-08-15 DIAGNOSIS — I69998 Other sequelae following unspecified cerebrovascular disease: Secondary | ICD-10-CM | POA: Diagnosis not present

## 2024-08-15 DIAGNOSIS — I4821 Permanent atrial fibrillation: Secondary | ICD-10-CM | POA: Diagnosis not present

## 2024-08-15 DIAGNOSIS — Z0000 Encounter for general adult medical examination without abnormal findings: Secondary | ICD-10-CM | POA: Diagnosis not present

## 2024-08-15 DIAGNOSIS — Z1331 Encounter for screening for depression: Secondary | ICD-10-CM | POA: Diagnosis not present

## 2024-08-15 DIAGNOSIS — E039 Hypothyroidism, unspecified: Secondary | ICD-10-CM | POA: Diagnosis not present

## 2024-08-15 DIAGNOSIS — E7800 Pure hypercholesterolemia, unspecified: Secondary | ICD-10-CM | POA: Diagnosis not present

## 2024-10-04 ENCOUNTER — Other Ambulatory Visit: Payer: Self-pay | Admitting: Cardiovascular Disease

## 2024-11-14 ENCOUNTER — Other Ambulatory Visit: Payer: Self-pay | Admitting: Cardiovascular Disease

## 2024-11-14 DIAGNOSIS — E785 Hyperlipidemia, unspecified: Secondary | ICD-10-CM

## 2024-11-17 ENCOUNTER — Telehealth (HOSPITAL_BASED_OUTPATIENT_CLINIC_OR_DEPARTMENT_OTHER): Payer: Self-pay | Admitting: Cardiovascular Disease

## 2024-11-17 DIAGNOSIS — E785 Hyperlipidemia, unspecified: Secondary | ICD-10-CM

## 2024-11-17 NOTE — Telephone Encounter (Signed)
" °*  STAT* If patient is at the pharmacy, call can be transferred to refill team.   1. Which medications need to be refilled? (please list name of each medication and dose if known)   atorvastatin  (LIPITOR) 40 MG tablet  hydrALAZINE  (APRESOLINE ) 25 MG tablet  spironolactone  (ALDACTONE ) 25 MG tablet    2. Would you like to learn more about the convenience, safety, & potential cost savings by using the [facility]?   3. Are you open to using the [facility] (Type [facility]. ).  4. Which pharmacy/location (including street and city if local pharmacy) is medication to be sent to?  [facility] - [place], [place] - [address]   5. Do they need a 30 day or 90 day supply?   Daughter Wyman) stated patient still has some medication.  Patient has appointment scheduled on 3/17 with Dr. Raford. "

## 2024-11-20 MED ORDER — SPIRONOLACTONE 25 MG PO TABS: 25.0000 mg | ORAL_TABLET | Freq: Every day | ORAL | 3 refills | Status: AC

## 2024-11-20 MED ORDER — HYDRALAZINE HCL 25 MG PO TABS: ORAL_TABLET | ORAL | 3 refills | Status: AC

## 2024-11-20 MED ORDER — ATORVASTATIN CALCIUM 40 MG PO TABS: 40.0000 mg | ORAL_TABLET | Freq: Every day | ORAL | 3 refills | Status: AC

## 2024-11-20 NOTE — Telephone Encounter (Signed)
 Refill sent

## 2025-01-02 ENCOUNTER — Ambulatory Visit (HOSPITAL_BASED_OUTPATIENT_CLINIC_OR_DEPARTMENT_OTHER): Admitting: Cardiovascular Disease
